# Patient Record
Sex: Male | Born: 1965 | Race: Black or African American | Hispanic: No | Marital: Single | Smoking: Never smoker
Health system: Southern US, Community
[De-identification: ages and names within clinical notes are randomized; demographics above are authoritative.]

## PROBLEM LIST (undated history)

## (undated) DIAGNOSIS — I10 Essential (primary) hypertension: Secondary | ICD-10-CM

## (undated) DIAGNOSIS — I509 Heart failure, unspecified: Secondary | ICD-10-CM

## (undated) HISTORY — PX: SHOULDER SURGERY: SHX246

## (undated) SURGERY — Surgical Case
Anesthesia: *Unknown

---

## 2008-09-08 ENCOUNTER — Ambulatory Visit: Payer: Self-pay | Admitting: Family Medicine

## 2008-09-08 ENCOUNTER — Inpatient Hospital Stay (HOSPITAL_COMMUNITY): Admission: EM | Admit: 2008-09-08 | Discharge: 2008-09-12 | Payer: Self-pay | Admitting: Emergency Medicine

## 2008-09-08 ENCOUNTER — Ambulatory Visit: Payer: Self-pay | Admitting: Cardiology

## 2008-09-08 ENCOUNTER — Encounter: Payer: Self-pay | Admitting: Internal Medicine

## 2008-09-09 ENCOUNTER — Encounter: Payer: Self-pay | Admitting: Cardiology

## 2008-09-12 ENCOUNTER — Encounter: Payer: Self-pay | Admitting: Cardiology

## 2008-09-18 ENCOUNTER — Ambulatory Visit: Payer: Self-pay | Admitting: Cardiology

## 2008-09-18 ENCOUNTER — Encounter: Payer: Self-pay | Admitting: Internal Medicine

## 2008-09-18 DIAGNOSIS — I10 Essential (primary) hypertension: Secondary | ICD-10-CM

## 2008-09-18 DIAGNOSIS — E663 Overweight: Secondary | ICD-10-CM | POA: Insufficient documentation

## 2008-09-18 DIAGNOSIS — I472 Ventricular tachycardia: Secondary | ICD-10-CM

## 2008-09-19 ENCOUNTER — Telehealth (INDEPENDENT_AMBULATORY_CARE_PROVIDER_SITE_OTHER): Payer: Self-pay | Admitting: Nurse Practitioner

## 2008-09-22 ENCOUNTER — Encounter (INDEPENDENT_AMBULATORY_CARE_PROVIDER_SITE_OTHER): Payer: Self-pay | Admitting: Nurse Practitioner

## 2008-09-25 ENCOUNTER — Ambulatory Visit: Payer: Self-pay | Admitting: Internal Medicine

## 2008-09-25 DIAGNOSIS — I5022 Chronic systolic (congestive) heart failure: Secondary | ICD-10-CM | POA: Insufficient documentation

## 2008-09-25 LAB — CONVERTED CEMR LAB
Calcium: 9.2 mg/dL (ref 8.4–10.5)
Chloride: 104 meq/L (ref 96–112)
GFR calc non Af Amer: 58.28 mL/min (ref >60)
Pro B Natriuretic peptide (BNP): 210 pg/mL — ABNORMAL HIGH (ref 0.0–100.0)

## 2008-09-30 ENCOUNTER — Telehealth: Payer: Self-pay | Admitting: Cardiology

## 2008-10-03 ENCOUNTER — Telehealth: Payer: Self-pay | Admitting: Internal Medicine

## 2008-10-06 ENCOUNTER — Ambulatory Visit: Payer: Self-pay | Admitting: Internal Medicine

## 2008-10-07 ENCOUNTER — Encounter (INDEPENDENT_AMBULATORY_CARE_PROVIDER_SITE_OTHER): Payer: Self-pay | Admitting: Nurse Practitioner

## 2008-10-07 ENCOUNTER — Ambulatory Visit (HOSPITAL_BASED_OUTPATIENT_CLINIC_OR_DEPARTMENT_OTHER): Admission: RE | Admit: 2008-10-07 | Discharge: 2008-10-07 | Payer: Self-pay | Admitting: Nurse Practitioner

## 2008-10-14 ENCOUNTER — Encounter (INDEPENDENT_AMBULATORY_CARE_PROVIDER_SITE_OTHER): Payer: Self-pay | Admitting: Nurse Practitioner

## 2008-10-15 ENCOUNTER — Encounter (INDEPENDENT_AMBULATORY_CARE_PROVIDER_SITE_OTHER): Payer: Self-pay | Admitting: Nurse Practitioner

## 2008-10-16 ENCOUNTER — Telehealth (INDEPENDENT_AMBULATORY_CARE_PROVIDER_SITE_OTHER): Payer: Self-pay | Admitting: Nurse Practitioner

## 2008-10-19 ENCOUNTER — Ambulatory Visit: Payer: Self-pay | Admitting: Internal Medicine

## 2008-10-21 ENCOUNTER — Encounter: Payer: Self-pay | Admitting: Internal Medicine

## 2008-10-22 ENCOUNTER — Encounter (INDEPENDENT_AMBULATORY_CARE_PROVIDER_SITE_OTHER): Payer: Self-pay | Admitting: Nurse Practitioner

## 2008-10-23 ENCOUNTER — Ambulatory Visit: Payer: Self-pay | Admitting: Cardiology

## 2008-10-23 ENCOUNTER — Encounter (INDEPENDENT_AMBULATORY_CARE_PROVIDER_SITE_OTHER): Payer: Self-pay | Admitting: Nurse Practitioner

## 2008-10-23 DIAGNOSIS — R5383 Other fatigue: Secondary | ICD-10-CM

## 2008-10-23 DIAGNOSIS — R0602 Shortness of breath: Secondary | ICD-10-CM | POA: Insufficient documentation

## 2008-10-23 DIAGNOSIS — R5381 Other malaise: Secondary | ICD-10-CM

## 2008-10-23 LAB — CONVERTED CEMR LAB
CO2: 28 meq/L (ref 19–32)
Calcium: 9 mg/dL (ref 8.4–10.5)
Glucose, Bld: 91 mg/dL (ref 70–99)
Magnesium: 2 mg/dL (ref 1.5–2.5)
Sodium: 143 meq/L (ref 135–145)

## 2008-10-26 ENCOUNTER — Encounter: Payer: Self-pay | Admitting: Cardiology

## 2008-10-28 ENCOUNTER — Ambulatory Visit: Payer: Self-pay

## 2008-10-29 ENCOUNTER — Telehealth (INDEPENDENT_AMBULATORY_CARE_PROVIDER_SITE_OTHER): Payer: Self-pay | Admitting: Nurse Practitioner

## 2008-10-30 ENCOUNTER — Ambulatory Visit: Payer: Self-pay | Admitting: Internal Medicine

## 2008-11-05 ENCOUNTER — Ambulatory Visit (HOSPITAL_BASED_OUTPATIENT_CLINIC_OR_DEPARTMENT_OTHER): Admission: RE | Admit: 2008-11-05 | Discharge: 2008-11-05 | Payer: Self-pay | Admitting: Internal Medicine

## 2008-11-27 ENCOUNTER — Ambulatory Visit: Payer: Self-pay | Admitting: Cardiology

## 2008-11-27 ENCOUNTER — Ambulatory Visit: Payer: Self-pay

## 2008-11-27 ENCOUNTER — Encounter: Payer: Self-pay | Admitting: Internal Medicine

## 2008-12-15 ENCOUNTER — Ambulatory Visit: Payer: Self-pay | Admitting: Pulmonary Disease

## 2008-12-15 DIAGNOSIS — G4733 Obstructive sleep apnea (adult) (pediatric): Secondary | ICD-10-CM

## 2008-12-24 ENCOUNTER — Encounter: Payer: Self-pay | Admitting: Pulmonary Disease

## 2009-01-26 ENCOUNTER — Ambulatory Visit: Payer: Self-pay | Admitting: Pulmonary Disease

## 2009-01-27 ENCOUNTER — Encounter: Payer: Self-pay | Admitting: Pulmonary Disease

## 2009-02-11 ENCOUNTER — Telehealth: Payer: Self-pay | Admitting: Internal Medicine

## 2009-02-13 ENCOUNTER — Encounter (INDEPENDENT_AMBULATORY_CARE_PROVIDER_SITE_OTHER): Payer: Self-pay | Admitting: Nurse Practitioner

## 2009-02-19 ENCOUNTER — Telehealth (INDEPENDENT_AMBULATORY_CARE_PROVIDER_SITE_OTHER): Payer: Self-pay | Admitting: Nurse Practitioner

## 2009-02-24 ENCOUNTER — Telehealth (INDEPENDENT_AMBULATORY_CARE_PROVIDER_SITE_OTHER): Payer: Self-pay | Admitting: Nurse Practitioner

## 2009-03-03 ENCOUNTER — Ambulatory Visit: Payer: Self-pay | Admitting: Internal Medicine

## 2009-03-11 ENCOUNTER — Ambulatory Visit: Payer: Self-pay

## 2009-03-11 ENCOUNTER — Encounter: Payer: Self-pay | Admitting: Internal Medicine

## 2009-03-11 ENCOUNTER — Ambulatory Visit: Payer: Self-pay | Admitting: Cardiology

## 2009-03-11 ENCOUNTER — Ambulatory Visit (HOSPITAL_COMMUNITY): Admission: RE | Admit: 2009-03-11 | Discharge: 2009-03-11 | Payer: Self-pay | Admitting: Internal Medicine

## 2009-03-16 ENCOUNTER — Telehealth (INDEPENDENT_AMBULATORY_CARE_PROVIDER_SITE_OTHER): Payer: Self-pay | Admitting: *Deleted

## 2009-05-21 ENCOUNTER — Emergency Department (HOSPITAL_COMMUNITY): Admission: EM | Admit: 2009-05-21 | Discharge: 2009-05-22 | Payer: Self-pay | Admitting: Emergency Medicine

## 2010-08-01 LAB — DIFFERENTIAL
Basophils Absolute: 0 10*3/uL (ref 0.0–0.1)
Basophils Relative: 1 % (ref 0–1)
Eosinophils Absolute: 0.3 10*3/uL (ref 0.0–0.7)
Monocytes Absolute: 0.4 10*3/uL (ref 0.1–1.0)
Monocytes Relative: 9 % (ref 3–12)

## 2010-08-01 LAB — CBC
HCT: 41.1 % (ref 39.0–52.0)
Hemoglobin: 14.5 g/dL (ref 13.0–17.0)
RDW: 13.6 % (ref 11.5–15.5)
WBC: 4.8 10*3/uL (ref 4.0–10.5)

## 2010-08-01 LAB — POCT CARDIAC MARKERS: Troponin i, poc: <0.05 ng/mL (ref 0.00–0.09)

## 2010-08-01 LAB — RAPID URINE DRUG SCREEN, HOSP PERFORMED
Barbiturates: NOT DETECTED
Cocaine: NOT DETECTED
Opiates: NOT DETECTED

## 2010-08-01 LAB — BASIC METABOLIC PANEL
BUN: 8 mg/dL (ref 6–23)
Calcium: 9.1 mg/dL (ref 8.4–10.5)
Creatinine, Ser: 1.15 mg/dL (ref 0.4–1.5)
GFR calc non Af Amer: >60 mL/min (ref >60)
Potassium: 3.4 mEq/L — ABNORMAL LOW (ref 3.5–5.1)

## 2010-08-25 LAB — POCT I-STAT, CHEM 8
BUN: 17 mg/dL (ref 6–23)
Calcium, Ion: 1.1 mmol/L — ABNORMAL LOW (ref 1.12–1.32)
Glucose, Bld: 134 mg/dL — ABNORMAL HIGH (ref 70–99)
HCT: 45 % (ref 39.0–52.0)
Sodium: 140 mEq/L (ref 135–145)

## 2010-08-25 LAB — BASIC METABOLIC PANEL
BUN: 25 mg/dL — ABNORMAL HIGH (ref 6–23)
CO2: 23 mEq/L (ref 19–32)
Calcium: 8.8 mg/dL (ref 8.4–10.5)
Chloride: 103 mEq/L (ref 96–112)
Chloride: 109 mEq/L (ref 96–112)
GFR calc Af Amer: >60 mL/min (ref >60)
GFR calc non Af Amer: 47 mL/min — ABNORMAL LOW (ref >60)
GFR calc non Af Amer: 52 mL/min — ABNORMAL LOW (ref >60)
GFR calc non Af Amer: 52 mL/min — ABNORMAL LOW (ref >60)
Glucose, Bld: 116 mg/dL — ABNORMAL HIGH (ref 70–99)
Glucose, Bld: 129 mg/dL — ABNORMAL HIGH (ref 70–99)
Glucose, Bld: 139 mg/dL — ABNORMAL HIGH (ref 70–99)
Potassium: 3.6 mEq/L (ref 3.5–5.1)
Potassium: 4.2 mEq/L (ref 3.5–5.1)
Potassium: 4.7 mEq/L (ref 3.5–5.1)
Sodium: 135 mEq/L (ref 135–145)
Sodium: 143 mEq/L (ref 135–145)
Sodium: 145 mEq/L (ref 135–145)

## 2010-08-25 LAB — DIFFERENTIAL
Basophils Absolute: 0 10*3/uL (ref 0.0–0.1)
Basophils Absolute: 0.1 10*3/uL (ref 0.0–0.1)
Basophils Relative: 1 % (ref 0–1)
Eosinophils Absolute: 0 10*3/uL (ref 0.0–0.7)
Eosinophils Relative: 0 % (ref 0–5)
Lymphocytes Relative: 39 % (ref 12–46)
Monocytes Absolute: 0.4 10*3/uL (ref 0.1–1.0)
Monocytes Relative: 9 % (ref 3–12)
Neutro Abs: 2.9 10*3/uL (ref 1.7–7.7)
Neutro Abs: 3 10*3/uL (ref 1.7–7.7)
Neutrophils Relative %: 51 % (ref 43–77)

## 2010-08-25 LAB — LIPID PANEL
Cholesterol: 148 mg/dL (ref 0–200)
LDL Cholesterol: 96 mg/dL (ref 0–99)
Total CHOL/HDL Ratio: 4.6 RATIO
Triglycerides: 100 mg/dL (ref <150)
VLDL: 20 mg/dL (ref 0–40)

## 2010-08-25 LAB — BRAIN NATRIURETIC PEPTIDE: Pro B Natriuretic peptide (BNP): 748 pg/mL — ABNORMAL HIGH (ref 0.0–100.0)

## 2010-08-25 LAB — POCT I-STAT 3, ART BLOOD GAS (G3+)
Acid-base deficit: 2 mmol/L (ref 0.0–2.0)
pH, Arterial: 7.409 (ref 7.350–7.450)
pO2, Arterial: 78 mmHg — ABNORMAL LOW (ref 80.0–100.0)

## 2010-08-25 LAB — COMPREHENSIVE METABOLIC PANEL
ALT: 87 U/L — ABNORMAL HIGH (ref 0–53)
Albumin: 3.3 g/dL — ABNORMAL LOW (ref 3.5–5.2)
Alkaline Phosphatase: 63 U/L (ref 39–117)
BUN: 17 mg/dL (ref 6–23)
Chloride: 108 mEq/L (ref 96–112)
Glucose, Bld: 146 mg/dL — ABNORMAL HIGH (ref 70–99)
Potassium: 4.3 mEq/L (ref 3.5–5.1)
Sodium: 141 mEq/L (ref 135–145)
Total Bilirubin: 1.7 mg/dL — ABNORMAL HIGH (ref 0.3–1.2)
Total Protein: 6.6 g/dL (ref 6.0–8.3)

## 2010-08-25 LAB — POCT I-STAT 3, VENOUS BLOOD GAS (G3P V)
TCO2: 28 mmol/L (ref 0–100)
pH, Ven: 7.371 — ABNORMAL HIGH (ref 7.250–7.300)

## 2010-08-25 LAB — PROTIME-INR: Prothrombin Time: 17.9 seconds — ABNORMAL HIGH (ref 11.6–15.2)

## 2010-08-25 LAB — URINALYSIS, ROUTINE W REFLEX MICROSCOPIC
Glucose, UA: NEGATIVE mg/dL
Ketones, ur: NEGATIVE mg/dL
Protein, ur: >300 mg/dL — AB
Urobilinogen, UA: 1 mg/dL (ref 0.0–1.0)

## 2010-08-25 LAB — POCT CARDIAC MARKERS: Myoglobin, poc: 206 ng/mL (ref 12–200)

## 2010-08-25 LAB — CK TOTAL AND CKMB (NOT AT ARMC): Total CK: 585 U/L — ABNORMAL HIGH (ref 7–232)

## 2010-08-25 LAB — CBC
HCT: 40 % (ref 39.0–52.0)
HCT: 41.9 % (ref 39.0–52.0)
Hemoglobin: 13.8 g/dL (ref 13.0–17.0)
Hemoglobin: 14.7 g/dL (ref 13.0–17.0)
MCHC: 34.9 g/dL (ref 30.0–36.0)
MCV: 92.7 fL (ref 78.0–100.0)
Platelets: 303 10*3/uL (ref 150–400)
RDW: 14.4 % (ref 11.5–15.5)
WBC: 4.6 10*3/uL (ref 4.0–10.5)
WBC: 5.3 10*3/uL (ref 4.0–10.5)

## 2010-08-25 LAB — RAPID URINE DRUG SCREEN, HOSP PERFORMED
Amphetamines: NOT DETECTED
Barbiturates: NOT DETECTED
Benzodiazepines: NOT DETECTED
Tetrahydrocannabinol: NOT DETECTED

## 2010-08-25 LAB — CARDIAC PANEL(CRET KIN+CKTOT+MB+TROPI)
CK, MB: 5.6 ng/mL — ABNORMAL HIGH (ref 0.3–4.0)
Relative Index: 1.1 (ref 0.0–2.5)

## 2010-08-25 LAB — CREATININE, URINE, RANDOM: Creatinine, Urine: 218.9 mg/dL

## 2010-08-25 LAB — SODIUM, URINE, RANDOM: Sodium, Ur: 46 mEq/L

## 2010-08-25 LAB — URINE MICROSCOPIC-ADD ON

## 2010-08-25 LAB — GLUCOSE, CAPILLARY: Glucose-Capillary: 119 mg/dL — ABNORMAL HIGH (ref 70–99)

## 2010-08-25 LAB — D-DIMER, QUANTITATIVE: D-Dimer, Quant: 0.64 ug/mL-FEU — ABNORMAL HIGH (ref 0.00–0.48)

## 2010-08-25 LAB — HEMOGLOBIN A1C: Mean Plasma Glucose: 126 mg/dL

## 2010-09-28 NOTE — Procedures (Signed)
NAME:  Kyle Conrad, Kyle Conrad NO.:  [id number]   MEDICAL RECORD NO.:  [id number]          PATIENT TYPE:  OUT   LOCATION:  SLEEP CENTER                 FACILITY:  [facility]   PHYSICIAN:  Clinton D. Maple Hudson, MD, FCCP, FACPDATE OF BIRTH:  02-Aug-1963   DATE OF STUDY:  10/07/2008                            NOCTURNAL POLYSOMNOGRAM   REFERRING PHYSICIAN:  [name], ACNP   REFERRING PHYSICIAN:  [name], ACNP   INDICATION FOR STUDY:  Hypersomnia with sleep apnea.   EPWORTH SLEEPINESS SCORE:  7/24.  BMI 33.2.  Weight was 245 pounds.  Height 72 inches.  Neck 17 inches.   MEDICATIONS:  Charted and reviewed.   SLEEP ARCHITECTURE:  Total sleep time 222 minutes with sleep efficiency  62.6%.  Stage I was 24.5%, stage II 62.8%, stage III absent, REM 12.6%  of total sleep time.  Sleep latency 54 minutes, REM latency 204 minutes,  awake after sleep onset 78.5 minutes, arousal index 27.  No bedtime  medication was taken.  Sleep onset was after midnight and was marked by  frequent sleep stage changes and brief waking until almost 3:00 a.m. and  sleep became better consolidated.  He indicated he was bothered some by  the monitoring leads and room temperature.   RESPIRATORY DATA:  Apnea-hypopnea index (AHI) 53.8 per hour.  A total of  199 events was scored including 86 obstructive apnea, 34 central apnea,  39 mixed apneas, and 40 hypopnea.  Events were more common while  nonsupine.  REM AHI 40.7.  This study was ordered as a diagnostic NPSG  protocol.  CPAP titration was not attempted.   OXYGEN DATA:  Moderate snoring with oxygen desaturation to a nadir of  85%.  Mean oxygen saturation through the study was 93.5% on room air.  A  total of 3.5 minutes was recorded with oxygen saturation less than 88%  on room air.   CARDIAC DATA:  Sinus rhythm with occasional PAC.   MOVEMENT-PARASOMNIA:  No significant movement disturbance.  No bathroom  trips.    IMPRESSION/RECOMMENDATIONS:  1. Restless fragmented sleep quality with sleep onset after midnight      and difficulty sustaining fragmented sleep.  2. Severe obstructive sleep apnea-hypopnea syndrome, apnea-hypopnea      index 53.8 per hour.  Events were more common while sleeping off      flat of back.  Moderately loud snoring with oxygen desaturation to      a nadir of 85%.  3. This study was ordered as a diagnostic nocturnal polysomnogram      protocol.  Consider return for continuous positive airway pressure      titration or evaluate for alternative management as appropriate.      Clinton D. Maple Hudson, MD, [facility], FACP  Diplomate, Biomedical engineer of Sleep Medicine  Electronically Signed     CDY/MEDQ  D:  10/19/2008 15:28:04  T:  10/20/2008 03:51:11  Job:  [id number]

## 2010-09-28 NOTE — Cardiovascular Report (Signed)
NAMEHAWKIN, CHARO NO.:  [id number]   MEDICAL RECORD NO.:  [id number]          PATIENT TYPE:  INP   LOCATION:  2030                         FACILITY:  [facility]   PHYSICIAN:  Verne Carrow, MDDATE OF BIRTH:  01-27-1966   DATE OF PROCEDURE:  09/11/2008  DATE OF DISCHARGE:                            CARDIAC CATHETERIZATION   PRIMARY CARDIOLOGIST:  Duke Salvia, MD, [facility]   PROCEDURES PERFORMED:  1. Left heart catheterization.  2. Right heart catheterization.  3. Selective coronary angiography.   OPERATOR:  Verne Carrow, MD   INDICATION:  This is a 45 year old African American male with a history  of hypertension who presented with dyspnea and was found to have new  onset cardiomyopathy with ejection fraction estimated at 20-25%.  The  patient has been having runs of nonsustained ventricular tachycardia in  the hospital and is referred today for right and left heart  catheterization.   PROCEDURE IN DETAIL:  The patient was brought into the inpatient cardiac  catheterization laboratory after signing informed consent for the  procedure.  The right groin was prepped and draped in a sterile fashion.  Lidocaine 1% was used for local anesthesia.  A 5-French sheath was  inserted into the right femoral artery without difficulty.  A 7-French  sheath was inserted into the right femoral vein without difficulty.  A  Swan-Ganz catheter was used to perform a right heart catheterization  with pressures measured in the right atrium, right ventricle, pulmonary  artery, and in a pulmonary capillary wedge position.  The Swan-Ganz  catheter was removed and then selective coronary angiography was  performed with standard diagnostic catheters.  At this point, a 5-French  pigtail catheter was used to cross the aortic valve into the left  ventricle.  No left ventricular angiogram was performed secondary to his  renal insufficiency.  Catheter was pulled back across the  aortic valve  with no significant pressure gradient measured.  The patient tolerated  the procedure well and was taken to the holding area in stable  condition.   HEMODYNAMIC FINDINGS:  Central aortic pressure 107/90, right atrial  pressure 16, right ventricular pressure 42/14, right ventricular end-  diastolic pressure 15, pulmonary artery pressure 43/26 with a mean  pulmonary artery pressure of 34, pulmonary capillary wedge pressure mean  29, aortic saturation 96%, pulmonary artery saturation 56%, cardiac  output 3.93 liters per minute (Fick method).  Cardiac index 1.67 liters  per minute per meter squared.   ANGIOGRAPHIC FINDINGS:  1. The left main coronary artery has no evidence of disease.  2. The left anterior descending is a large vessel that courses to the      apex and wraps around the apex.  This gives off a very large      diagonal branch that supplies much of the lateral wall.  There was      no evidence of disease in this system.  3. The circumflex artery is a small-to-moderate-sized vessel that has      a small area of distribution in the lateral wall.  There was no  evidence of disease in this system.  4. The right coronary artery is a large dominant vessel that      bifurcates into a posterior descending artery and a posterolateral      branch.  It gives off a small-to-moderate-sized bifurcating RV      marginal branch.  There was no evidence of disease in this system.  5. No left ventricular angiogram was performed secondary to his renal      insufficiency.   IMPRESSION:  1. Elevated filling pressures.  2. No angiographic evidence of coronary artery disease.  3. Reduced cardiac output.   RECOMMENDATIONS:  Because of his renal insufficiency, I would recommend  gentle hydration postcatheterization and then we will recommend gentle  diuresis with the elevated filling pressures.  I would continue his  aspirin, beta-blocker, and ACE inhibitor.       Verne Carrow, MD  Electronically Signed     CM/MEDQ  D:  09/11/2008  T:  09/12/2008  Job:  [id number]   cc:   Duke Salvia, MD, [facility]

## 2010-09-28 NOTE — Discharge Summary (Signed)
Kyle Conrad, BETZOLD NO.:  [id number]   MEDICAL RECORD NO.:  [id number]          PATIENT TYPE:  INP   LOCATION:  2030                         FACILITY:  [facility]   PHYSICIAN:  Duke Salvia, MD, FACCDATE OF BIRTH:  1966/01/09   DATE OF ADMISSION:  09/08/2008  DATE OF DISCHARGE:  09/12/2008                               DISCHARGE SUMMARY   CARDIOLOGIST:  Duke Salvia, MD, [facility]   DISCHARGING DIAGNOSES:  1. Congestive heart failure secondary to nonischemic cardiomyopathy      with unclear etiology at this time, most likely secondary to      uncontrolled hypertension.  2. Nonsustained ventricular tachycardia.  3. Hypertension x6 years, previously managed with exercise and diet      per the patient report.  4. Impaired glucose tolerance this admission.  5. Presumed obstructive sleep apnea pending formal sleep      evaluation/study outpatient.  6. Status post cardiac catheterization this admission.  The patient      with no angiographic evidence of coronary artery disease, but with      elevated filling pressures, and reduced cardiac output.   Kyle Conrad is a 45 year old African American gentleman with history of  allergic rhinitis and diet-controlled hypertension per the patient's  report who presented on day of admission complaining of lightheadedness,  presyncope associated with dizziness, shortness of breath, nausea, and  vomiting.  Shortness of breath became more pronounced.  The patient has  significant orthopnea and chest tightness.  The patient initially went  to his primary care physician's office.  EKG was done.  Blood pressure  was checked.  Chest x-ray was done.  The patient was told his heart was  enlarged, his blood pressure was elevated and that his heart rate was  accelerated.  In the emergency room here, his blood pressure was in the  170s to 120s range and he had a heart rate which apparently in the range  of near 200, but rhythm strip was not  obtained nor an EKG.  Darryl  Prime, Cardiology fellow saw the patient in the ER.  His heart rate had  dropped to the 110 range.  CT of the head showed no acute findings.  CT  angio of the chest showed no evidence of pulmonary emboli.  Chest x-ray  showed ground-glass opacities in the lungs likely representing edema.  There was mild vascular congestion.  EKG showed sinus tachycardia with a  rate greater than 116 beats per minute, probable LVH.  Drug screen was  negative.  BNP was elevated at 1452, troponin 0.03, creatinine 1.7.  The  patient was started on beta-blocker, aspirin, and nitrates.  He was  given Lopressor IV and 40 of Lasix IV.  He was admitted for further  evaluation.  A 2-D echocardiogram obtained on September 09, 2008, the  patient noted to have an EF of 20-25%.  The patient responded well to  diuretics, blood pressure stabilized.  The patient ruled out for  myocardial infarction by serial cardiac markers.  Decided to proceed  with a cardiac catheterization.  The patient had noted runs  of  nonsustained V-tach, asymptomatic.  The patient taken to the cath lab on  September 11, 2008, by Dr. Verne Carrow.   HEMODYNAMIC FINDINGS:  Pulmonary artery pressure 43/26 with a mean  pulmonary artery pressure of 34.  Pulmonary capillary wedge pressure  mean of 29.  Cardiac output 3.93 liters per minute, Fick method.  Cardiac index 1.67 liters per minute.   The patient tolerated the procedure without complications.  Nutritional  consult requested and the patient spoke with dietitian regarding the 2-g  sodium restricted diet.  Dr. Graciela Husbands will see the patient on day of  discharge.  The patient still with elevated blood pressure at times as  high as 194/94, lisinopril increased to 20 mg daily, hydrochlorothiazide  added to medications.  The patient tolerating Coreg 25 mg b.i.d.  The  patient has ambulated in the hall, stable for discharge home later  today.  Weight at time of discharge 244  pounds.   Medications include lisinopril/hydrochlorothiazide 20/12.5 mg daily,  Coreg 25 mg b.i.d., and aspirin 325.  I have initiated CHF teaching with  the patient.  He will follow up in the clinic with me on Sep 18, 2008 at  2:30, which time, I will plan on repeating BMET and a BNP and then I  will arrange for the patient to follow up with Dr. Graciela Husbands.  We will also  arrange outpatient sleep study.  Duration of discharge encountered over  30 minutes.      [name], ACNP      Duke Salvia, MD, [facility]  Electronically Signed    MB/MEDQ  D:  09/12/2008  T:  09/13/2008  Job:  [id number]   cc:   Sharlot Gowda, M.D.

## 2010-09-28 NOTE — H&P (Signed)
NAMEKENITH, TRICKEL             ACCOUNT NO.:  [id number]   MEDICAL RECORD NO.:  [id number]          PATIENT TYPE:  INP   LOCATION:  2312                         FACILITY:  [facility]   PHYSICIAN:  Darryl D. Prime, MD    DATE OF BIRTH:  1965/10/07   DATE OF ADMISSION:  09/08/2008  DATE OF DISCHARGE:                              HISTORY & PHYSICAL   CODE STATUS:  Patient is full code.   CARDIOLOGIST:  None.   PRIMARY CARE PHYSICIAN:   CHIEF COMPLAINT:  Heart rate is fast and he keeps throwing up.   HISTORY OF PRESENT ILLNESS:  Mr. Carignan is a 45 year old male with a  history of allergic rhinitis who notes 5 days of significant dizziness,  which when pressed, sounds like lightheadedness/presyncope with no  vertigo, upon walking.  The patient had an episode 5 days prior to  admission where he became dizzy, short of breath, lightheaded with  exertion and had a bout of emesis, nonbloody and nonbilious.  Since  then, he has had significant orthopnea, chest tightness particularly  when he tries to lay flat.  His chest tightness is described as a mild  sharp pain, squeezing sensation, when he lays flat.  It has been waking  him out of sleep and this does sound like paroxysmal nocturnal dyspnea,  at least once a night.  The patient saw his primary care physician after  having these symptoms, having called to set up an appointment.  He saw  him on September 08, 2008, and EKG was done, blood pressure was checked,  chest x-ray was done and he was told that his heart was enlarged, his  blood pressure was elevated as well as his heart rate and he was set up  for an imaging study, unsure what type of imaging study this was.  The  patient, apparently when he left the doctors office, had an episode of  emesis and shortness of breath and was brought to the emergency room.  He has had significant dyspnea on exertion, to 60 feet, over the last 7  days.  The patient notes the symptoms are more profound  when going up  stairs.  In the emergency room, his blood pressure is in the range of  170s/120s and he had a heart rate which was apparently in the range of  near 200 but the rhythm strip was not obtained, nor an EKG.  His heart  rate has since dropped down into the 110s range.  EKG was obtained at  that time.  He denies any recent fever, denies any recent cold like  symptoms.   PAST MEDICAL HISTORY:  1. He has never had a cardiac catheterization.  He has never had an      echocardiogram.  2. He has a history of hypertension for 5-6 years now that he      apparently is trying to control with exercise and diet.  3. History of obesity.  4. History of rotator cuff tear, status post repair on the right.   ALLERGIES:  PENICILLIN which causes rash.   MEDICATIONS:  Claritin.  SOCIAL HISTORY:  He lives in [place].  He has never smoked and has  not had any alcohol in many years.  No illicit drug use.  He is an  Airline pilot, is single.   FAMILY HISTORY:  Positive for diabetes mellitus in both father and  mother in her 24s.  No premature coronary artery disease.   REVIEW OF SYSTEMS:  A 14-point review of systems is negative unless  stated above.   PHYSICAL EXAMINATION:  VITAL SIGNS:  Temperature 97.6 with a blood  pressure 173/122.  Respiratory rate 22.  Saturation 93% in room air.  GENERAL:  The patient is an obese male, sitting upright in mild  respiratory distress.  He had a bout of emesis in the emergency room.  HEENT:  Normocephalic, atraumatic.  Pupils are equal, round and reactive  to light.  Extraocular movements intact.  Oropharynx dry.  NECK:  Supple without lymphadenopathy or thyromegaly.  Jugular venous  distention is to the mandible.  The patient has no carotid bruits.  CARDIOVASCULAR:  Regular rhythm with a fast rate and S4 gallop noted.  LUNGS: Bibasilar rales on inspiration.  SKIN:  No rash.  ABDOMEN:  Obese, soft, nontender. No hepatosplenomegaly.  No rebound or   guarding.  EXTREMITIES:  No clubbing, cyanosis, or edema.  NEUROLOGIC:  He is alert and oriented x4 with cranial nerves II-XII  grossly intact.  Strength and sensation grossly intact.   DATA:  CT of the head showed no acute disease.  He had a CT angio of the  chest as he had a mildly elevated D-dimer which showed no evidence of  pulmonary embolus.  He had ground-glass opacities in the lungs, likely  representing edema.  There was mild vascular congestion.  The patient  had cardiomegaly.  His EKG showed sinus tachycardia with rate greater  than 116 beats per minute, normal axe; intervals PR 160, QRS 170, QT  corrected 428 with right and left atrial enlargement by EKG, probable  LVH with no prior EKG available.   Labs showed white count of 5.7, hemoglobin 14.9, hematocrit 42.8 and  platelets of 324.  The D-dimer was 0.64.  urinalysis showed rare  bacteria, protein 300, urine drug screen negative.  B-type natriuretic  peptide 1452.  Cardiac markers at 1954 hours unremarkable.  His CK-MB at  2217 hours was 6.5; however CK 585 with troponin 0.03.  Sodium 140,  potassium 4.3, chloride 109, bicarb 22, BUN 17, creatinine 1.7, and  glucose of 134.  No old creatinine to compare.   ASSESSMENT AND PLAN:  This is a patient who has been having problems  with profound dyspnea on exertion, emesis associated with it and chest  tightness.  He is possibly having hypertensive emergency with signs of  kidney dysfunction and heart dysfunction.  He has an apparent acute on  chronic diastolic heart failure, cannot exclude systolic heart failure  as well.  He will be admitted to rule out acute coronary syndrome,  supraventricular or ventricular arrhythmias will also be investigated.  The patient will be placed on beta blockade, aspirin, and nitrates as  needed and we will concentrate on controlling his blood pressure  aggressively.  His blood pressure was 130/90 once he was given Lopressor  5 mg IV and 25  p.o. Lopressor in the emergency room.  He was also given  40 mg IV of Lasix.  For his hypertensive emergency, will control his  blood pressure as above.  For his congestive heart failure, will  control  his blood pressure, place him on a beta blocker.  Will also get an  echocardiogram.  He will most likely benefit from an ARB or ACE  inhibitor before discharge.  Place him on strict in's and out's, daily  weights, 2 gram  sodium diet and he will need counseling concerning blood pressure and  possible heart failure.  Check a TSH and magnesium.  For his kidney  dysfunction, will get a renal ultrasound and protein /creatinine ratio.  He may need a nephrology consult eventually.  DVT and GI prophylaxis  will be ordered.      Darryl D. Prime, MD  Electronically Signed     DDP/MEDQ  D:  09/09/2008  T:  09/09/2008  Job:  [id number]

## 2010-09-28 NOTE — Procedures (Signed)
NAMEMarland Kitchen  Kyle Conrad, Kyle Conrad NO.:  [id number]   MEDICAL RECORD NO.:  [id number]          PATIENT TYPE:  OUT   LOCATION:  SLEEP CENTER                 FACILITY:  [facility]   PHYSICIAN:  Barbaraann Share, MD,FCCPDATE OF BIRTH:  1965-06-25   DATE OF STUDY:  11/05/2008                            NOCTURNAL POLYSOMNOGRAM   REFERRING PHYSICIAN:  Duke Salvia, MD, [facility]   REFERRING PHYSICIAN:  Duke Salvia, MD, [facility]   INDICATION FOR STUDY:  Hypersomnia with sleep apnea.  The patient  returns for this CPAP optimization after being diagnosed with severe  obstructive sleep apnea.   EPWORTH SLEEPINESS SCORE:  7.   MEDICATIONS:   SLEEP ARCHITECTURE:  The patient had a total sleep time of 235 minutes  with no slow wave sleep and only 70 minutes of REM noted.  Sleep onset  latency was prolonged at 58 minutes, and REM onset was normal at 90  minutes.  Sleep efficiency was poor at 66%.   RESPIRATORY DATA:  The patient underwent CPAP titration with a large  ResMed Quattro Full Face Mask.  His pressure was initiated at a level of  6 cm of water and gradually increased in order to treat both obstructive  events and loud snoring.  The patient had good control of his  obstructive events at a pressure of 15, however, had some mild  intermittent breakthrough snoring that was ultimately resolved with a  pressure of 17 cm of water.  There seemed to be good tolerance  throughout the night.   OXYGEN DATA:  There was O2 desaturation as low as 81% prior to reaching  optimal CPAP.   CARDIAC DATA:  Occasional PVC, but no clinically significant arrhythmias  were seen.   MOVEMENT-PARASOMNIA:  The patient had no significant leg jerks or  abnormal behavior seen.   IMPRESSIONS-RECOMMENDATIONS:  1. Good control of previously documented severe obstructive sleep      apnea with a large ResMed Quattro Full Face Mask, and delivering a      CPAP pressure of 15-17 cm of water.  I would initiate the  patient      on 10 cm of water pressure if he has not been on CPAP prior, and      work towards pressure of 15 cm and see how he does.  I would      also encourage the patient to work aggressively on weight loss.  2. Occasional PVC, but no clinically significant arrhythmia seen.      Barbaraann Share, MD,FCCP  Diplomate, American Board of Sleep  Medicine  Electronically Signed     KMC/MEDQ  D:  11/12/2008 20:25:35  T:  11/13/2008 07:59:38  Job:  [id number]

## 2015-04-22 ENCOUNTER — Encounter (HOSPITAL_COMMUNITY): Payer: Self-pay | Admitting: Emergency Medicine

## 2015-04-22 ENCOUNTER — Emergency Department (HOSPITAL_COMMUNITY): Payer: Self-pay

## 2015-04-22 ENCOUNTER — Inpatient Hospital Stay (HOSPITAL_COMMUNITY)
Admission: EM | Admit: 2015-04-22 | Discharge: 2015-05-02 | DRG: 287 | Disposition: A | Discharge status: Home / Self Care | Payer: Self-pay | Attending: Internal Medicine | Admitting: Internal Medicine

## 2015-04-22 DIAGNOSIS — R188 Other ascites: Secondary | ICD-10-CM | POA: Diagnosis present

## 2015-04-22 DIAGNOSIS — G4733 Obstructive sleep apnea (adult) (pediatric): Secondary | ICD-10-CM | POA: Diagnosis present

## 2015-04-22 DIAGNOSIS — R601 Generalized edema: Secondary | ICD-10-CM

## 2015-04-22 DIAGNOSIS — D649 Anemia, unspecified: Secondary | ICD-10-CM

## 2015-04-22 DIAGNOSIS — I429 Cardiomyopathy, unspecified: Secondary | ICD-10-CM | POA: Diagnosis present

## 2015-04-22 DIAGNOSIS — I509 Heart failure, unspecified: Secondary | ICD-10-CM

## 2015-04-22 DIAGNOSIS — R0602 Shortness of breath: Secondary | ICD-10-CM

## 2015-04-22 DIAGNOSIS — E8809 Other disorders of plasma-protein metabolism, not elsewhere classified: Secondary | ICD-10-CM | POA: Diagnosis present

## 2015-04-22 DIAGNOSIS — K2960 Other gastritis without bleeding: Secondary | ICD-10-CM | POA: Diagnosis present

## 2015-04-22 DIAGNOSIS — I10 Essential (primary) hypertension: Secondary | ICD-10-CM | POA: Diagnosis present

## 2015-04-22 DIAGNOSIS — M7989 Other specified soft tissue disorders: Secondary | ICD-10-CM

## 2015-04-22 DIAGNOSIS — D689 Coagulation defect, unspecified: Secondary | ICD-10-CM | POA: Diagnosis present

## 2015-04-22 DIAGNOSIS — K922 Gastrointestinal hemorrhage, unspecified: Secondary | ICD-10-CM

## 2015-04-22 DIAGNOSIS — I5023 Acute on chronic systolic (congestive) heart failure: Secondary | ICD-10-CM

## 2015-04-22 DIAGNOSIS — I110 Hypertensive heart disease with heart failure: Principal | ICD-10-CM | POA: Diagnosis present

## 2015-04-22 DIAGNOSIS — R000 Tachycardia, unspecified: Secondary | ICD-10-CM | POA: Diagnosis present

## 2015-04-22 DIAGNOSIS — D500 Iron deficiency anemia secondary to blood loss (chronic): Secondary | ICD-10-CM

## 2015-04-22 DIAGNOSIS — I272 Other secondary pulmonary hypertension: Secondary | ICD-10-CM | POA: Diagnosis present

## 2015-04-22 DIAGNOSIS — Z9114 Patient's other noncompliance with medication regimen: Secondary | ICD-10-CM

## 2015-04-22 DIAGNOSIS — K449 Diaphragmatic hernia without obstruction or gangrene: Secondary | ICD-10-CM | POA: Diagnosis present

## 2015-04-22 DIAGNOSIS — E876 Hypokalemia: Secondary | ICD-10-CM | POA: Diagnosis present

## 2015-04-22 DIAGNOSIS — K269 Duodenal ulcer, unspecified as acute or chronic, without hemorrhage or perforation: Secondary | ICD-10-CM | POA: Diagnosis present

## 2015-04-22 DIAGNOSIS — K5730 Diverticulosis of large intestine without perforation or abscess without bleeding: Secondary | ICD-10-CM | POA: Diagnosis present

## 2015-04-22 DIAGNOSIS — I340 Nonrheumatic mitral (valve) insufficiency: Secondary | ICD-10-CM | POA: Diagnosis present

## 2015-04-22 DIAGNOSIS — R200 Anesthesia of skin: Secondary | ICD-10-CM

## 2015-04-22 HISTORY — DX: Heart failure, unspecified: I50.9

## 2015-04-22 HISTORY — DX: Essential (primary) hypertension: I10

## 2015-04-22 LAB — DIFFERENTIAL
BASOS ABS: 0 10*3/uL (ref 0.0–0.1)
Basophils Relative: 0 %
EOS PCT: 1 %
Eosinophils Absolute: 0 10*3/uL (ref 0.0–0.7)
Lymphocytes Relative: 12 %
Lymphs Abs: 0.5 10*3/uL — ABNORMAL LOW (ref 0.7–4.0)
MONO ABS: 0.6 10*3/uL (ref 0.1–1.0)
Monocytes Relative: 13 %
NEUTROS PCT: 74 %
Neutro Abs: 3.2 10*3/uL (ref 1.7–7.7)

## 2015-04-22 LAB — CBC
HEMATOCRIT: 27.6 % — AB (ref 39.0–52.0)
HEMOGLOBIN: 7.9 g/dL — AB (ref 13.0–17.0)
MCH: 22.1 pg — AB (ref 26.0–34.0)
MCHC: 28.6 g/dL — AB (ref 30.0–36.0)
MCV: 77.3 fL — ABNORMAL LOW (ref 78.0–100.0)
Platelets: 393 10*3/uL (ref 150–400)
RBC: 3.57 MIL/uL — ABNORMAL LOW (ref 4.22–5.81)
RDW: 21.7 % — ABNORMAL HIGH (ref 11.5–15.5)
WBC: 4.3 10*3/uL (ref 4.0–10.5)

## 2015-04-22 LAB — BASIC METABOLIC PANEL
ANION GAP: 5 (ref 5–15)
BUN: 14 mg/dL (ref 6–20)
CO2: 23 mmol/L (ref 22–32)
Calcium: 8.4 mg/dL — ABNORMAL LOW (ref 8.9–10.3)
Chloride: 111 mmol/L (ref 101–111)
Creatinine, Ser: 0.98 mg/dL (ref 0.61–1.24)
GFR calc Af Amer: >60 mL/min (ref >60)
GLUCOSE: 124 mg/dL — AB (ref 65–99)
POTASSIUM: 4.8 mmol/L (ref 3.5–5.1)
Sodium: 139 mmol/L (ref 135–145)

## 2015-04-22 LAB — URINE MICROSCOPIC-ADD ON

## 2015-04-22 LAB — RAPID URINE DRUG SCREEN, HOSP PERFORMED
Amphetamines: NOT DETECTED
Barbiturates: NOT DETECTED
Benzodiazepines: NOT DETECTED
Cocaine: NOT DETECTED
OPIATES: NOT DETECTED
TETRAHYDROCANNABINOL: NOT DETECTED

## 2015-04-22 LAB — URINALYSIS, ROUTINE W REFLEX MICROSCOPIC
BILIRUBIN URINE: NEGATIVE
GLUCOSE, UA: NEGATIVE mg/dL
KETONES UR: NEGATIVE mg/dL
Leukocytes, UA: NEGATIVE
NITRITE: NEGATIVE
PH: 5 (ref 5.0–8.0)
Protein, ur: NEGATIVE mg/dL
Specific Gravity, Urine: 1.013 (ref 1.005–1.030)

## 2015-04-22 LAB — HEPATIC FUNCTION PANEL
ALBUMIN: 2.5 g/dL — AB (ref 3.5–5.0)
ALK PHOS: 95 U/L (ref 38–126)
ALT: 22 U/L (ref 17–63)
AST: 31 U/L (ref 15–41)
BILIRUBIN DIRECT: 0.8 mg/dL — AB (ref 0.1–0.5)
BILIRUBIN INDIRECT: 0.9 mg/dL (ref 0.3–0.9)
BILIRUBIN TOTAL: 1.7 mg/dL — AB (ref 0.3–1.2)
Total Protein: 7.2 g/dL (ref 6.5–8.1)

## 2015-04-22 LAB — ABO/RH: ABO/RH(D): B POS

## 2015-04-22 LAB — I-STAT TROPONIN, ED: Troponin i, poc: 0.01 ng/mL (ref 0.00–0.08)

## 2015-04-22 LAB — BRAIN NATRIURETIC PEPTIDE: B Natriuretic Peptide: 2601 pg/mL — ABNORMAL HIGH (ref 0.0–100.0)

## 2015-04-22 LAB — MAGNESIUM: MAGNESIUM: 1.7 mg/dL (ref 1.7–2.4)

## 2015-04-22 LAB — POC OCCULT BLOOD, ED: Fecal Occult Bld: POSITIVE — AB

## 2015-04-22 MED ORDER — FUROSEMIDE 10 MG/ML IJ SOLN
40.0000 mg | Freq: Once | INTRAMUSCULAR | Status: AC
  Administered 2015-04-22: 40 mg via INTRAVENOUS
  Filled 2015-04-22: qty 4

## 2015-04-22 MED ORDER — HYDRALAZINE HCL 10 MG PO TABS
10.0000 mg | ORAL_TABLET | Freq: Three times a day (TID) | ORAL | Status: DC
  Administered 2015-04-22 – 2015-04-24 (×5): 10 mg via ORAL
  Filled 2015-04-22 (×6): qty 1

## 2015-04-22 MED ORDER — FUROSEMIDE 10 MG/ML IJ SOLN: 40.0000 mg | Freq: Once | INTRAMUSCULAR | Status: DC

## 2015-04-22 MED ORDER — FUROSEMIDE 10 MG/ML IJ SOLN
80.0000 mg | Freq: Two times a day (BID) | INTRAMUSCULAR | Status: DC
  Administered 2015-04-23: 80 mg via INTRAVENOUS
  Filled 2015-04-22: qty 8

## 2015-04-22 NOTE — ED Notes (Signed)
Pt states over the last couple of days he has had a sudden onset of fluid retention and shortness of breath. Pt has large amount of swelling in abdomen, arms and in groin area. Pt denies any chest pain. pt's breathing is labored but able to speak in complete sentences.

## 2015-04-22 NOTE — ED Notes (Signed)
PA at bedside with ultrasound.  

## 2015-04-22 NOTE — ED Provider Notes (Signed)
CSN: [id number]     Arrival date & time 04/22/15  1636 History   First MD Initiated Contact with Patient 04/22/15 1659     Chief Complaint  Patient presents with  . Shortness of Breath     (Consider location/radiation/quality/duration/timing/severity/associated sxs/prior Treatment) HPI Comments: Patient with history of congestive heart failure, not currently being treated for this (however normal ECHO in 2009), hep B, alcohol abuse per records -- presents with complaint of 3 days of body swelling, starting in his groin and spreading to his legs, trunk, and arms. He has had increasing shortness of breath. He states that he has had to sleep lying on an increased number of pillows. No change in urination, states that he has had incontinent sometimes when he bends over. No chest pain, fever. He has had increase in cough. Notes NSAID use and recent dark stools. No hematochezia. The onset of this condition was acute. The course is gradually worsening. Aggravating factors: activity, lying flat. Alleviating factors: none.    The history is provided by the patient.    Past Medical History  Diagnosis Date  . CHF (congestive heart failure) ([place])    History reviewed. No pertinent past surgical history. No family history on file. Social History  Substance Use Topics  . Smoking status: Never Smoker   . Smokeless tobacco: None  . Alcohol Use: No    Review of Systems  Constitutional: Negative for fever.  HENT: Negative for rhinorrhea and sore throat.   Eyes: Negative for redness.  Respiratory: Positive for cough and shortness of breath.   Cardiovascular: Positive for leg swelling. Negative for chest pain.  Gastrointestinal: Negative for nausea, vomiting, abdominal pain and diarrhea.  Genitourinary: Positive for enuresis. Negative for dysuria.  Musculoskeletal: Negative for myalgias.  Skin: Negative for rash.  Neurological: Negative for headaches.      Allergies  Lisinopril and  Penicillins  Home Medications   Prior to Admission medications   Not on File   BP 122/83 mmHg  Pulse 105  Temp(Src) 97.5 F (36.4 C) (Oral)  Resp 16  Ht 6' (1.829 m)  SpO2 95%   Physical Exam  Constitutional: He appears well-developed and well-nourished.  HENT:  Head: Normocephalic and atraumatic.  Mouth/Throat: Oropharynx is clear and moist.  Eyes: Conjunctivae are normal. Right eye exhibits no discharge. Left eye exhibits no discharge.  Neck: Normal range of motion. Neck supple. JVD present.  Cardiovascular: Regular rhythm and normal heart sounds.  Tachycardia present.   No murmur heard. Pulmonary/Chest: Effort normal. He has rales (bibasilar, mild).  Abdominal: Soft. There is no tenderness. There is no rebound and no guarding.  Musculoskeletal: He exhibits edema.  Anasarca, bilateral lower and upper pitting edema. Pitting edema of trunk and lower chest.   Neurological: He is alert.  Skin: Skin is warm and dry.  Psychiatric: He has a normal mood and affect.  Nursing note and vitals reviewed.   ED Course  Procedures (including critical care time) Labs Review Labs Reviewed  BASIC METABOLIC PANEL - Abnormal; Notable for the following:    Glucose, Bld 124 (*)    Calcium 8.4 (*)    All other components within normal limits  CBC - Abnormal; Notable for the following:    RBC 3.57 (*)    Hemoglobin 7.9 (*)    HCT 27.6 (*)    MCV 77.3 (*)    MCH 22.1 (*)    MCHC 28.6 (*)    RDW 21.7 (*)    All  other components within normal limits  BRAIN NATRIURETIC PEPTIDE - Abnormal; Notable for the following:    B Natriuretic Peptide 2601.0 (*)    All other components within normal limits  DIFFERENTIAL - Abnormal; Notable for the following:    Lymphs Abs 0.5 (*)    All other components within normal limits  POC OCCULT BLOOD, ED - Abnormal; Notable for the following:    Fecal Occult Bld POSITIVE (*)    All other components within normal limits  URINALYSIS, ROUTINE W REFLEX  MICROSCOPIC (NOT AT [facility])  HEPATIC FUNCTION PANEL  I-STAT TROPOININ, ED  I-STAT TROPOININ, ED  TYPE AND SCREEN    Imaging Review Dg Chest 2 View  04/22/2015  CLINICAL DATA:  Two week history of cough, congestion, weakness and upper extremity swelling. EXAM: CHEST  2 VIEW COMPARISON:  05/21/2009 FINDINGS: The heart is enlarged and somewhat accentuated by the AP projection. Given the shape of the heart could not exclude a pericardial effusion. There is mild tortuosity of the thoracic aorta. The lungs are clear. No pleural effusion. The bony thorax is grossly intact. IMPRESSION: Cardiac enlargement which represents a change since the prior study. Could not exclude a pericardial effusion. No acute pulmonary findings. Electronically Signed   By: Marijo Sanes M.D.   On: 04/22/2015 18:00   I have personally reviewed and evaluated these images and lab results as part of my medical decision-making.   5:05 PM Patient seen and examined. Work-up initiated. Discussed with Dr. Roxanne Mins.    Vital signs reviewed and are as follows: BP 122/83 mmHg  Pulse 105  Temp(Src) 97.5 F (36.4 C) (Oral)  Resp 16  Ht 6' (1.829 m)  SpO2 95%  I used bedside US to look at heart, subxiphoid view, no large pericardial effusion seen. Technically difficult given habitus and truncal edema.   ED ECG REPORT   Date: 04/22/2015  Rate: 109  Rhythm: sinus tachycardia  QRS Axis: right  Intervals: normal  ST/T Wave abnormalities: nonspecific ST changes  Conduction Disutrbances:none  Narrative Interpretation: low voltage  Old EKG Reviewed: none available  I have personally reviewed the EKG tracing and agree with the computerized printout as noted.  6:24 PM Hgb noted. Patient endorses using NSAIDs and having some dark stools recently.   7:26 PM Discussed with and seen by Dr. Roxanne Mins. Will admit.    7:42 PM Spoke with [facility] who will see. Temp orders placed. Patient is stable.    MDM   Final diagnoses:  Anasarca   Gastrointestinal hemorrhage, unspecified gastritis, unspecified gastrointestinal hemorrhage type  Iron deficiency anemia due to chronic blood loss   Admit.     Carlisle Cater, PA-C [id number]  Delora Fuel, MD [id number]

## 2015-04-22 NOTE — ED Notes (Signed)
Report attempted. RN unable to take call at this time.

## 2015-04-22 NOTE — ED Notes (Signed)
Pt off unit with xray 

## 2015-04-22 NOTE — H&P (Signed)
History and Physical  Patient Name: Kyle Conrad     [id number]    DOB: Sep 18, 1965    DOA: 04/22/2015 Referring physician: Delora Fuel, MD PCP: No PCP Per Patient      Chief Complaint: Shortness of breath and weight gain  HPI: Exzavier Conrad is a 49 y.o. male with a past medical history significant for HTN and NICM with episode of acute CHF in 2010 who presents with several months worsening dyspnea on exertion and weight gain.  The patient had an episode of congestive heart failure around 2010, had clean coronaries on cath with an EF of 20%, was treated heart failure clinic it appears for about one year, his EF returned around 60%, and then was lost to follow-up.  The patient reports to me that he was "all better" since then and so he didn't follow up with heart failure clinic or find a PCP. He was working as an Optometrist, and physically active (biking 30 miles twice a week and going to the gym). Around October of this year he lost his job, and it was in the context that he started to notice worsening shortness of breath.  Since that time, the patient has had progressive shortness of breath with exertion, increased swelling first in the ankles and legs, belly, arms, and scrotum, return of orthopnea, paroxysmal nocturnal dyspnea (he describes this as "feeling like my chest gets heavy when I lay down, and then it all filters out when I sit back up"). In the last week he has been having apple cider vinegar as a diuretic, but his symptoms progressed so he came to the ER.  In the ED, the patient was tachycardic, had a BNP 2601 pg per male, and new cardiomegaly on chest x-ray. His ECG showed sinus tachycardia. Of note the patient's hemoglobin was 7.9 g/dL and he was fecal occult blood positive. He denied hematemesis, melena, or frank bleeding from the rectum. He notes taking 1-2 aspirin or naproxen daily for a while given knee pain with his excess water weight.     Review of Systems:  Pt  complains of swelling, shortness of breath, mild right-sided stabbing chest discomfort, dizziness with exertion, one episode of painless hematochezia or year ago, orthopnea, paroxysmal nocturnal dyspnea. All other systems negative except as just noted or noted in the history of present illness.   Allergies:  Allergies  Allergen Reactions  . Lisinopril     REACTION: pt had a swelling episode.  His lips swelled  . Penicillins     Home medications: None since 2010  Past medical history: 1. NICM c/b systolic CHF untreated since 2010 2. HTN  Past surgical history: 1. Shoulder surgery  Family history:  Mother, lung cancer. No family history of autoimmune disease that the patient knows of. No family history of heart disease.   Social History:  Patient lives alone. He is currently out of work, but was formerly an Optometrist. He has no wife or children. He reports formerly exercising vigorously, but not since around October. He is a nonsmoker. He does not use illicit drugs. Denies alcohol use.        Physical Exam: BP 128/93 mmHg  Pulse 108  Temp(Src) 97.5 F (36.4 C) (Oral)  Resp 11  Ht 6' (1.829 m)  SpO2 94% General appearance: Well-developed, adult male, alert and in no acute distress.  Grossly swollen.  Pleasant mood.   Eyes: Icteric.     ENT: No nasal deformity, discharge, or epistaxis.  OP moist  without lesions.  Subungual jaundice. Skin: Warm and dry.   Cardiac: Tachy regular, nl S1-S2, no murmurs appreciated.  Capillary refill is brisk.  JVP not visible given habitus.  LE brawny edema.  Scrotal edema.  The arms have dependent pitting edema. Respiratory: Normal respiratory rate and rhythm.  Breath sounds distant.  No crackles definitely heard. Abdomen: Abdomen tense with ascites.   MSK: No deformities or effusions. Neuro: Sensorium intact and responding to questions, attention normal.  Speech is fluent.  Moves all extremities equally and with normal coordination.      Psych: Behavior appropriate.  Affect normal.  No evidence of aural or visual hallucinations or delusions.       Labs on Admission:  The metabolic panel shows normal sodium, potassium, bicarbonate, and renal function. The glucose is normal. The total and direct bilirubin are slightly elevated. Albumin is very low at 2.5 g/dL. The BNP is 2601 pg per male. Physical occult blood testing is positive. The troponin is negative. The complete blood count shows no leukocytosis or thrombocytopenia. Hemoglobin is 7.9 g/dL. The previous last was 14 in 2011..   Radiological Exams on Admission: Personally reviewed: Dg Chest 2 View 04/22/2015 Cardiomegaly marked.  Cephalization of vessels.    EKG: Independently reviewed. Sinus tachycardia.    Assessment/Plan 1. Acute CHF:  This is new.  Unclear type, ssytolic versus diastolic at this time, but presumed systolic again.  Has history NICM, 2010, resolved and not on meds and without follow up since then.   -Furosemide 80 mg IV twice daily -Echocardiogram -Consult to Cardiology, appreciate cares    2. HTN:  Hypertensive at admission.  Patient had angioedema with lisinopril.   -Imdur low dose now, titrate up per Cardiology  3. Anemia and positive fecal occult blood:  Without gross bleeding or melena.  NSAID use+.  Monitor for bleeding. -Transfusion threshold per Cardiology, informed consent obtained verbally with patient -LDH given elevated bilirubin and dipstick positive urine hemoglobin with normal micro -Retic count ordered -Iron studies and vitamin B12/folate ordered  4. Hypoalbuminemia:  Patient anasarcic, but urine dipstick negative.  Creatinine normal. -Urine protein/creatinine ratio    DVT PPx: SCDs given FOBT+ Diet: Cardiac Consultants: Cardiology Code Status: Full Family Communication: None  Medical decision making: What exists of the patient's previous chart was reviewed in depth and the case was discussed with Alecia Lemming, PA-C. Patient seen 8:34 PM on 04/22/2015.  Disposition Plan:  Admit for CHF.  Consultation with Cardiology and diuresis.  Anticipate 4+ days admission given degree of fluid overload.      Edwin Dada [facility] Pager [phone]

## 2015-04-23 ENCOUNTER — Ambulatory Visit (HOSPITAL_COMMUNITY): Payer: MEDICAID

## 2015-04-23 ENCOUNTER — Encounter (HOSPITAL_COMMUNITY): Payer: Self-pay | Admitting: Gastroenterology

## 2015-04-23 ENCOUNTER — Inpatient Hospital Stay (HOSPITAL_COMMUNITY): Payer: Self-pay

## 2015-04-23 DIAGNOSIS — I5021 Acute systolic (congestive) heart failure: Secondary | ICD-10-CM

## 2015-04-23 DIAGNOSIS — R601 Generalized edema: Secondary | ICD-10-CM | POA: Insufficient documentation

## 2015-04-23 LAB — VITAMIN B12
VITAMIN B 12: 986 pg/mL — AB (ref 180–914)
Vitamin B-12: 1019 pg/mL — ABNORMAL HIGH (ref 180–914)

## 2015-04-23 LAB — RETICULOCYTES
RBC.: 3.39 MIL/uL — ABNORMAL LOW (ref 4.22–5.81)
RBC.: 3.29 MIL/uL — ABNORMAL LOW (ref 4.22–5.81)
RETIC COUNT ABSOLUTE: 57.6 10*3/uL (ref 19.0–186.0)
RETIC CT PCT: 1.7 % (ref 0.4–3.1)
Retic Count, Absolute: 55.9 10*3/uL (ref 19.0–186.0)
Retic Ct Pct: 1.7 % (ref 0.4–3.1)

## 2015-04-23 LAB — PROTIME-INR
INR: 1.86 — ABNORMAL HIGH (ref 0.00–1.49)
Prothrombin Time: 21.4 seconds — ABNORMAL HIGH (ref 11.6–15.2)

## 2015-04-23 LAB — BASIC METABOLIC PANEL
ANION GAP: 8 (ref 5–15)
BUN: 11 mg/dL (ref 6–20)
CALCIUM: 8.3 mg/dL — AB (ref 8.9–10.3)
CHLORIDE: 107 mmol/L (ref 101–111)
CO2: 25 mmol/L (ref 22–32)
Creatinine, Ser: 1.04 mg/dL (ref 0.61–1.24)
GFR calc non Af Amer: >60 mL/min (ref >60)
GLUCOSE: 98 mg/dL (ref 65–99)
Potassium: 4 mmol/L (ref 3.5–5.1)
SODIUM: 140 mmol/L (ref 135–145)

## 2015-04-23 LAB — PREPARE RBC (CROSSMATCH)

## 2015-04-23 LAB — IRON AND TIBC
IRON: 14 ug/dL — AB (ref 45–182)
IRON: 17 ug/dL — AB (ref 45–182)
SATURATION RATIOS: 4 % — AB (ref 17.9–39.5)
SATURATION RATIOS: 5 % — AB (ref 17.9–39.5)
TIBC: 332 ug/dL (ref 250–450)
TIBC: 337 ug/dL (ref 250–450)
UIBC: 318 ug/dL
UIBC: 320 ug/dL

## 2015-04-23 LAB — LACTATE DEHYDROGENASE: LDH: 242 U/L — AB (ref 98–192)

## 2015-04-23 LAB — FERRITIN
Ferritin: 20 ng/mL — ABNORMAL LOW (ref 24–336)
Ferritin: 21 ng/mL — ABNORMAL LOW (ref 24–336)

## 2015-04-23 LAB — PROTEIN / CREATININE RATIO, URINE
Creatinine, Urine: 72.92 mg/dL
PROTEIN CREATININE RATIO: 0.4 mg/mg{creat} — AB (ref 0.00–0.15)
Total Protein, Urine: 29 mg/dL

## 2015-04-23 LAB — FOLATE
Folate: 15.7 ng/mL (ref >5.9)
Folate: 15.1 ng/mL (ref >5.9)

## 2015-04-23 MED ORDER — SODIUM CHLORIDE 0.9 % IV SOLN
8.0000 mg/h | INTRAVENOUS | Status: DC
  Administered 2015-04-23 – 2015-04-26 (×6): 8 mg/h via INTRAVENOUS
  Filled 2015-04-23 (×11): qty 80

## 2015-04-23 MED ORDER — BARIUM SULFATE 2.1 % PO SUSP
ORAL | Status: AC
  Filled 2015-04-23: qty 2

## 2015-04-23 MED ORDER — IOHEXOL 300 MG/ML  SOLN
100.0000 mL | Freq: Once | INTRAMUSCULAR | Status: AC | PRN
  Administered 2015-04-23: 100 mL via INTRAVENOUS

## 2015-04-23 MED ORDER — SODIUM CHLORIDE 0.9 % IV SOLN
80.0000 mg | Freq: Once | INTRAVENOUS | Status: AC
  Administered 2015-04-23: 80 mg via INTRAVENOUS
  Filled 2015-04-23: qty 80

## 2015-04-23 MED ORDER — SODIUM CHLORIDE 0.9 % IV SOLN
25.0000 mg | Freq: Once | INTRAVENOUS | Status: DC
  Filled 2015-04-23: qty 2

## 2015-04-23 MED ORDER — BARIUM SULFATE 2.1 % PO SUSP
900.0000 mL | Freq: Once | ORAL | Status: AC
  Administered 2015-04-23: 900 mL via ORAL

## 2015-04-23 MED ORDER — PANTOPRAZOLE SODIUM 40 MG IV SOLR
40.0000 mg | Freq: Two times a day (BID) | INTRAVENOUS | Status: DC
  Administered 2015-04-26 – 2015-04-29 (×6): 40 mg via INTRAVENOUS
  Filled 2015-04-23 (×7): qty 40

## 2015-04-23 MED ORDER — PHYTONADIONE 5 MG PO TABS
10.0000 mg | ORAL_TABLET | Freq: Once | ORAL | Status: AC
  Administered 2015-04-23: 10 mg via ORAL
  Filled 2015-04-23: qty 2

## 2015-04-23 MED ORDER — SODIUM CHLORIDE 0.9 % IV SOLN
Freq: Once | INTRAVENOUS | Status: AC
  Administered 2015-04-23: 21:00:00 via INTRAVENOUS

## 2015-04-23 MED ORDER — FUROSEMIDE 10 MG/ML IJ SOLN
40.0000 mg | Freq: Two times a day (BID) | INTRAMUSCULAR | Status: DC
  Administered 2015-04-23 – 2015-04-25 (×6): 40 mg via INTRAVENOUS
  Filled 2015-04-23 (×6): qty 4

## 2015-04-23 MED ORDER — NA FERRIC GLUC CPLX IN SUCROSE 12.5 MG/ML IV SOLN
125.0000 mg | Freq: Once | INTRAVENOUS | Status: DC
  Filled 2015-04-23: qty 10

## 2015-04-23 MED ORDER — MAGNESIUM OXIDE 400 (241.3 MG) MG PO TABS
400.0000 mg | ORAL_TABLET | Freq: Two times a day (BID) | ORAL | Status: DC
  Administered 2015-04-23 – 2015-05-02 (×18): 400 mg via ORAL
  Filled 2015-04-23 (×18): qty 1

## 2015-04-23 NOTE — Progress Notes (Signed)
PICC line unable to be placed today, IV consult placed for 2nd IV for blood admin this afternoon per Abrol, MD. WIll start FE infusion after.

## 2015-04-23 NOTE — Consult Note (Addendum)
Reason for Consult: Anemia Referring Physician: Hospital team  Kyle Conrad is an 49 y.o. male.  HPI: Patient seen and examined and his hospital computer chart was reviewed and he really does not have much GI complaints and has not had any recent GI workup and his family history is negative for any GI problems but he has been taking some aspirin and he does not smoke and only rarely drinks his stools have been a little darker than usual and he is not aware of his weight gain and he was admitted for shortness of breath and is currently feeling better and has no other complaints  Past Medical History  Diagnosis Date  . CHF (congestive heart failure) ([facility])   . Hypertension     Past Surgical History  Procedure Laterality Date  . Shoulder surgery      Family History  Problem Relation Age of Onset  . Lung cancer Mother     Social History:  reports that he has never smoked. He does not have any smokeless tobacco history on file. He reports that he does not drink alcohol or use illicit drugs.  Allergies:  Allergies  Allergen Reactions  . Lisinopril     REACTION: pt had a swelling episode.  His lips swelled  . Penicillins     Medications: I have reviewed the patient's current medications.  Results for orders placed or performed during the hospital encounter of 04/22/15 (from the past 48 hour(s))  Protein / creatinine ratio, urine     Status: Abnormal   Collection Time: 04/22/15  7:57 AM  Result Value Ref Range   Creatinine, Urine 72.92 mg/dL   Total Protein, Urine 29 mg/dL    Comment: NO NORMAL RANGE ESTABLISHED FOR THIS TEST   Protein Creatinine Ratio 0.40 (H) 0.00 - 0.15 mg/mg[Cre]  Brain natriuretic peptide     Status: Abnormal   Collection Time: 04/22/15  5:43 PM  Result Value Ref Range   B Natriuretic Peptide 2601.0 (H) 0.0 - 100.0 pg/mL  Basic metabolic panel     Status: Abnormal   Collection Time: 04/22/15  5:44 PM  Result Value Ref Range   Sodium 139 135 - 145  mmol/L   Potassium 4.8 3.5 - 5.1 mmol/L   Chloride 111 101 - 111 mmol/L   CO2 23 22 - 32 mmol/L   Glucose, Bld 124 (H) 65 - 99 mg/dL   BUN 14 6 - 20 mg/dL   Creatinine, Ser 6.47 0.61 - 1.24 mg/dL   Calcium 8.4 (L) 8.9 - 10.3 mg/dL   GFR calc non Af Amer >60 >60 mL/min   GFR calc Af Amer >60 >60 mL/min    Comment: (NOTE) The eGFR has been calculated using the CKD EPI equation. This calculation has not been validated in all clinical situations. eGFR's persistently <60 mL/min signify possible Chronic Kidney Disease.    Anion gap 5 5 - 15  CBC     Status: Abnormal   Collection Time: 04/22/15  5:44 PM  Result Value Ref Range   WBC 4.3 4.0 - 10.5 K/uL   RBC 3.57 (L) 4.22 - 5.81 MIL/uL   Hemoglobin 7.9 (L) 13.0 - 17.0 g/dL   HCT 04.3 (L) 55.7 - 80.0 %   MCV 77.3 (L) 78.0 - 100.0 fL   MCH 22.1 (L) 26.0 - 34.0 pg   MCHC 28.6 (L) 30.0 - 36.0 g/dL   RDW 63.2 (H) 15.3 - 19.8 %   Platelets 393 150 - 400 K/uL  Differential     Status: Abnormal   Collection Time: 04/22/15  5:44 PM  Result Value Ref Range   Neutrophils Relative % 74 %   Lymphocytes Relative 12 %   Monocytes Relative 13 %   Eosinophils Relative 1 %   Basophils Relative 0 %   Neutro Abs 3.2 1.7 - 7.7 K/uL   Lymphs Abs 0.5 (L) 0.7 - 4.0 K/uL   Monocytes Absolute 0.6 0.1 - 1.0 K/uL   Eosinophils Absolute 0.0 0.0 - 0.7 K/uL   Basophils Absolute 0.0 0.0 - 0.1 K/uL   RBC Morphology POLYCHROMASIA PRESENT     Comment: TARGET CELLS  POC occult blood, ED RN will collect     Status: Abnormal   Collection Time: 04/22/15  6:19 PM  Result Value Ref Range   Fecal Occult Bld POSITIVE (A) NEGATIVE  I-stat troponin, ED (not at [facility], [facility])     Status: None   Collection Time: 04/22/15  7:12 PM  Result Value Ref Range   Troponin i, poc 0.01 0.00 - 0.08 ng/mL   Comment 3            Comment: Due to the release kinetics of cTnI, a negative result within the first hours of the onset of symptoms does not rule out myocardial infarction  with certainty. If myocardial infarction is still suspected, repeat the test at appropriate intervals.   Type and screen     Status: None (Preliminary result)   Collection Time: 04/22/15  7:34 PM  Result Value Ref Range   ABO/RH(D) B POS    Antibody Screen NEG    Sample Expiration 04/25/2015    Unit Number [id number]    Blood Component Type RED CELLS,LR    Unit division 00    Status of Unit ALLOCATED    Transfusion Status OK TO TRANSFUSE    Crossmatch Result Compatible   Hepatic function panel     Status: Abnormal   Collection Time: 04/22/15  7:34 PM  Result Value Ref Range   Total Protein 7.2 6.5 - 8.1 g/dL   Albumin 2.5 (L) 3.5 - 5.0 g/dL   AST 31 15 - 41 U/L   ALT 22 17 - 63 U/L   Alkaline Phosphatase 95 38 - 126 U/L   Total Bilirubin 1.7 (H) 0.3 - 1.2 mg/dL   Bilirubin, Direct 0.8 (H) 0.1 - 0.5 mg/dL   Indirect Bilirubin 0.9 0.3 - 0.9 mg/dL  ABO/Rh     Status: None   Collection Time: 04/22/15  7:34 PM  Result Value Ref Range   ABO/RH(D) B POS   Urinalysis, Routine w reflex microscopic     Status: Abnormal   Collection Time: 04/22/15  7:48 PM  Result Value Ref Range   Color, Urine AMBER (A) YELLOW    Comment: BIOCHEMICALS MAY BE AFFECTED BY COLOR   APPearance CLEAR CLEAR   Specific Gravity, Urine 1.013 1.005 - 1.030   pH 5.0 5.0 - 8.0   Glucose, UA NEGATIVE NEGATIVE mg/dL   Hgb urine dipstick MODERATE (A) NEGATIVE   Bilirubin Urine NEGATIVE NEGATIVE   Ketones, ur NEGATIVE NEGATIVE mg/dL   Protein, ur NEGATIVE NEGATIVE mg/dL   Nitrite NEGATIVE NEGATIVE   Leukocytes, UA NEGATIVE NEGATIVE  Urine rapid drug screen (hosp performed)     Status: None   Collection Time: 04/22/15  7:48 PM  Result Value Ref Range   Opiates NONE DETECTED NONE DETECTED   Cocaine NONE DETECTED NONE DETECTED   Benzodiazepines NONE DETECTED NONE  DETECTED   Amphetamines NONE DETECTED NONE DETECTED   Tetrahydrocannabinol NONE DETECTED NONE DETECTED   Barbiturates NONE DETECTED NONE DETECTED     Comment:        DRUG SCREEN FOR MEDICAL PURPOSES ONLY.  IF CONFIRMATION IS NEEDED FOR ANY PURPOSE, NOTIFY LAB WITHIN 5 DAYS.        LOWEST DETECTABLE LIMITS FOR URINE DRUG SCREEN Drug Class       Cutoff (ng/mL) Amphetamine      1000 Barbiturate      200 Benzodiazepine   482 Tricyclics       707 Opiates          300 Cocaine          300 THC              50   Urine microscopic-add on     Status: Abnormal   Collection Time: 04/22/15  7:48 PM  Result Value Ref Range   Squamous Epithelial / LPF 0-5 (A) NONE SEEN   WBC, UA 0-5 0 - 5 WBC/hpf   RBC / HPF 0-5 0 - 5 RBC/hpf   Bacteria, UA RARE (A) NONE SEEN   Casts HYALINE CASTS (A) NEGATIVE  Magnesium     Status: None   Collection Time: 04/22/15 10:02 PM  Result Value Ref Range   Magnesium 1.7 1.7 - 2.4 mg/dL  Iron and TIBC     Status: Abnormal   Collection Time: 04/23/15  3:55 AM  Result Value Ref Range   Iron 14 (L) 45 - 182 ug/dL   TIBC 332 250 - 450 ug/dL   Saturation Ratios 4 (L) 17.9 - 39.5 %   UIBC 318 ug/dL  Ferritin     Status: Abnormal   Collection Time: 04/23/15  3:55 AM  Result Value Ref Range   Ferritin 20 (L) 24 - 336 ng/mL  Folate     Status: None   Collection Time: 04/23/15  3:55 AM  Result Value Ref Range   Folate 15.7 >5.9 ng/mL  Vitamin B12     Status: Abnormal   Collection Time: 04/23/15  3:55 AM  Result Value Ref Range   Vitamin B-12 1019 (H) 180 - 914 pg/mL    Comment: (NOTE) This assay is not validated for testing neonatal or myeloproliferative syndrome specimens for Vitamin B12 levels.   Reticulocytes     Status: Abnormal   Collection Time: 04/23/15  3:55 AM  Result Value Ref Range   Retic Ct Pct 1.7 0.4 - 3.1 %   RBC. 3.39 (L) 4.22 - 5.81 MIL/uL   Retic Count, Manual 57.6 19.0 - 186.0 K/uL  Lactate dehydrogenase     Status: Abnormal   Collection Time: 04/23/15  3:55 AM  Result Value Ref Range   LDH 242 (H) 98 - 192 U/L  Basic metabolic panel     Status: Abnormal   Collection Time:  04/23/15  3:55 AM  Result Value Ref Range   Sodium 140 135 - 145 mmol/L   Potassium 4.0 3.5 - 5.1 mmol/L   Chloride 107 101 - 111 mmol/L   CO2 25 22 - 32 mmol/L   Glucose, Bld 98 65 - 99 mg/dL   BUN 11 6 - 20 mg/dL   Creatinine, Ser 1.04 0.61 - 1.24 mg/dL   Calcium 8.3 (L) 8.9 - 10.3 mg/dL   GFR calc non Af Amer >60 >60 mL/min   GFR calc Af Amer >60 >60 mL/min    Comment: (NOTE) The eGFR  has been calculated using the CKD EPI equation. This calculation has not been validated in all clinical situations. eGFR's persistently <60 mL/min signify possible Chronic Kidney Disease.    Anion gap 8 5 - 15  Protime-INR     Status: Abnormal   Collection Time: 04/23/15  3:55 AM  Result Value Ref Range   Prothrombin Time 21.4 (H) 11.6 - 15.2 seconds   INR 1.86 (H) 0.00 - 1.49  Vitamin B12     Status: Abnormal   Collection Time: 04/23/15 10:16 AM  Result Value Ref Range   Vitamin B-12 986 (H) 180 - 914 pg/mL    Comment: (NOTE) This assay is not validated for testing neonatal or myeloproliferative syndrome specimens for Vitamin B12 levels.   Folate     Status: None   Collection Time: 04/23/15 10:16 AM  Result Value Ref Range   Folate 15.1 >5.9 ng/mL  Iron and TIBC     Status: Abnormal   Collection Time: 04/23/15 10:16 AM  Result Value Ref Range   Iron 17 (L) 45 - 182 ug/dL   TIBC 337 250 - 450 ug/dL   Saturation Ratios 5 (L) 17.9 - 39.5 %   UIBC 320 ug/dL  Ferritin     Status: Abnormal   Collection Time: 04/23/15 10:16 AM  Result Value Ref Range   Ferritin 21 (L) 24 - 336 ng/mL  Reticulocytes     Status: Abnormal   Collection Time: 04/23/15 10:16 AM  Result Value Ref Range   Retic Ct Pct 1.7 0.4 - 3.1 %   RBC. 3.29 (L) 4.22 - 5.81 MIL/uL   Retic Count, Manual 55.9 19.0 - 186.0 K/uL  Prepare RBC     Status: None   Collection Time: 04/23/15 10:44 AM  Result Value Ref Range   Order Confirmation ORDER PROCESSED BY BLOOD BANK     Dg Chest 2 View  04/22/2015  CLINICAL DATA:  Two  week history of cough, congestion, weakness and upper extremity swelling. EXAM: CHEST  2 VIEW COMPARISON:  05/21/2009 FINDINGS: The heart is enlarged and somewhat accentuated by the AP projection. Given the shape of the heart could not exclude a pericardial effusion. There is mild tortuosity of the thoracic aorta. The lungs are clear. No pleural effusion. The bony thorax is grossly intact. IMPRESSION: Cardiac enlargement which represents a change since the prior study. Could not exclude a pericardial effusion. No acute pulmonary findings. Electronically Signed   By: Marijo Sanes M.D.   On: 04/22/2015 18:00    ROS negative except above Blood pressure 111/72, pulse 98, temperature 98 F (36.7 C), temperature source Oral, resp. rate 18, height 6' (1.829 m), weight 127.3 kg (280 lb 10.3 oz), SpO2 100 %. Physical Exam vital signs stable afebrile no acute distress he does have some expiratory wheezes decreased heart sounds abdomen is soft nontender guaiac positive labs reviewed pertinent for being iron deficient increased INR questionable etiology  Assessment/Plan: Iron deficiency with guaiac positivity Plan: Will begin workup with an abdominal pelvic CAT scan to rule out anything significant and evaluate his liver and then when okay with cardiology will need endoscopy and/or colonoscopy at that point and will follow with you and certainly his GI workup could be done as an outpatient if needed  [facility] E 04/23/2015, 3:32 PM

## 2015-04-23 NOTE — Care Management Note (Addendum)
Case Management Note  Patient Details  Name: Kyle Conrad MRN: [id number] Date of Birth: 09-27-1965  Subjective/Objective:      Pt admitted with acute CHF              Action/Plan:  Pt is independent from home.  Per pt; diagnosis of CHF has been >3 years.  CM assessed pt for HF screen; pt stated he has a scale and will begin weight self daily, pt states he adheres to low sodium diet.  Pt stated he had to walk with a cane prior to admit, PT eval will be ordered.  Pt confirmed he does not have health insurance nor PCP, pt agreed for CM to initiate [facility] services.   Expected Discharge Date:                  Expected Discharge Plan:     In-House Referral:     Discharge planning Services  CM Consult  Post Acute Care Choice:    Choice offered to:     DME Arranged:    DME Agency:     HH Arranged:    HH Agency:     Status of Service:  In process, will continue to follow  Medicare Important Message Given:    Date Medicare IM Given:    Medicare IM give by:    Date Additional Medicare IM Given:    Additional Medicare Important Message give by:     If discussed at [place] of Stay Meetings, dates discussed:    Additional Comments: CM assessed pt.  Pt [place] appt 04/30/15 at 10:30am.  CM provided pt [facility] brochure for the sickle cell location.  Pt made aware that he will be able to fill scripts at clinic immediately post discharge and to apply for medication assistance during intial appt. Maryclare Labrador, RN 04/23/2015, 11:22 AM

## 2015-04-23 NOTE — Consult Note (Signed)
Patient ID: Kyle Conrad MRN: [id number], DOB/AGE: 01/01/1966   Admit date: 04/22/2015   Primary Physician: No PCP Per Patient Primary Cardiologist: Previously Dr. Caryl Comes (last seen in 2010)  Pt. Profile:  49 y/o male with previous h/o nonischemic cardiomyopathy with improvement in EF from 15-20% to 60% in 2010, admitted for acute CHF exacerbation, also discovered to have new microcytic anemia.   Problem List  Past Medical History  Diagnosis Date  . CHF (congestive heart failure) ([place])   . Hypertension     Past Surgical History  Procedure Laterality Date  . Shoulder surgery       Allergies  Allergies  Allergen Reactions  . Lisinopril     REACTION: pt had a swelling episode.  His lips swelled  . Penicillins     HPI  49 y/o male, previously followed by Dr. Caryl Comes. He has not followed up since 2010. He has a h/o nonischemic cardiomyopathy/ systolic CHF, with EF as low as 15-20% in 2010. Subsequent LHC by Dr. Angelena Form 09/11/2008 showed no angiographic evidence of coronary disease. Elevated filling pressures with reduced cardiac output was noted. He was placed on guidelines directed medical therapy with Coreg, Imdur and hydralazine. He was not treated with an ACE/ARB at that time given abnormal renal function. Repeat 2D echo in July 2015 showed improvement in EF back to 40%. With improvement in EF, there was no indication for ICD implantation. He had another 2D echo in Oct 2010 which showed further improvement with EF at 60%. Since that time, he has failed to f/u routinely.   He presented to the [facility] ED 04/22/15 with complaint of several months of worsening dyspnea on exertion and weight gain. He notes 20-30 lb gradual weight gain since October. He initially developed LEE around his ankles that has progressed upwards. Now with anasarca with edema up his thighs, distended abdomen + significant RUE edema. He notes associated orthopnea and PND. He admits that he stopped all of his HF  meds in 2011 because he felt he no longer needed to take them.   BNP on admit was 2601. CXR showed enlarged cardiac silhouette. Lungs were clear w/o pleural effusion. Additional labs show microcytic anemia with Hgb at 7.9 (previously 14.5 in 2011). FOBT +. Further anemic w/u pending. Admission EKG shows sinus tach with rate of 109 and low voltage QRS complexes. His weight today is 280 lb this is compared to 259 lb at his last OV in 2010.   He has been admitted by IM. He is currently on 40 mg IV lasix BID and 10 mg of hydralazine Q8H. He is also on IV Protonix and IV Fe. Renal function is normal with SCr at 1.04.   Cardiology has been consulted for CHF management.      Home Medications  Prior to Admission medications   Not on File    Family History  Family History  Problem Relation Age of Onset  . Lung cancer Mother     Social History  Social History   Social History  . Marital Status: Single    Spouse Name: N/A  . Number of Children: N/A  . Years of Education: N/A   Occupational History  . Not on file.   Social History Main Topics  . Smoking status: Never Smoker   . Smokeless tobacco: Not on file  . Alcohol Use: No  . Drug Use: No  . Sexual Activity: Not on file   Other Topics Concern  . Not on  file   Social History Narrative  . No narrative on file     Review of Systems General:  No chills, fever, night sweats or weight changes.  Cardiovascular:  No chest pain, +dyspnea on exertion, +edema, +orthopnea, no palpitations, +paroxysmal nocturnal dyspnea. Dermatological: No rash, lesions/masses Respiratory: No cough, dyspnea Urologic: No hematuria, dysuria Abdominal:   No nausea, vomiting, diarrhea, bright red blood per rectum, melena, or hematemesis Neurologic:  No visual changes, wkns, changes in mental status. All other systems reviewed and are otherwise negative except as noted above.  Physical Exam  Blood pressure 111/72, pulse 98, temperature 98 F  (36.7 C), temperature source Oral, resp. rate 18, height 6' (1.829 m), weight 280 lb 10.3 oz (127.3 kg), SpO2 100 %.  General: Pleasant, NAD, obese/ anasarca  Psych: Normal affect. Neuro: Alert and oriented X 3. Moves all extremities spontaneously. HEENT: Normal  Neck: Supple without bruits + elevated JVD. Lungs:  Resp regular and unlabored, deceased BS bilaterally with faint basilar rales, R>L Heart: RRR no s3, s4, or murmurs. Abdomen:  non-tender, distended, BS + x 4.  Extremities: No clubbing. 2+ bilateral LEE.  DP/PT/Radials 2+ and equal bilaterally. RUE edematous   Labs  Troponin (Point of Care Test)  Recent Labs  04/22/15 1912  TROPIPOC 0.01   No results for input(s): CKTOTAL, CKMB, TROPONINI in the last 72 hours. Lab Results  Component Value Date   WBC 4.3 04/22/2015   HGB 7.9* 04/22/2015   HCT 27.6* 04/22/2015   MCV 77.3* 04/22/2015   PLT 393 04/22/2015    Recent Labs Lab 04/22/15 1934 04/23/15 0355  NA  --  140  K  --  4.0  CL  --  107  CO2  --  25  BUN  --  11  CREATININE  --  1.04  CALCIUM  --  8.3*  PROT 7.2  --   BILITOT 1.7*  --   ALKPHOS 95  --   ALT 22  --   AST 31  --   GLUCOSE  --  98   Lab Results  Component Value Date   CHOL  09/08/2008    148        ATP III CLASSIFICATION:  <200     mg/dL   Desirable  200-239  mg/dL   Borderline High  >=240    mg/dL   High          HDL 32* 09/08/2008   LDLCALC  09/08/2008    96        Total Cholesterol/HDL:CHD Risk Coronary Heart Disease Risk Table                     Men   Women  1/2 Average Risk   3.4   3.3  Average Risk       5.0   4.4  2 X Average Risk   9.6   7.1  3 X Average Risk  23.4   11.0        Use the calculated Patient Ratio above and the CHD Risk Table to determine the patient's CHD Risk.        ATP III CLASSIFICATION (LDL):  <100     mg/dL   Optimal  100-129  mg/dL   Near or Above                    Optimal  130-159  mg/dL   Borderline  160-189  mg/dL   High  >  190      mg/dL   Very High   TRIG 100 09/08/2008   Lab Results  Component Value Date   DDIMER * 09/08/2008    0.64        AT THE INHOUSE ESTABLISHED CUTOFF VALUE OF 0.48 ug/mL FEU, THIS ASSAY HAS BEEN DOCUMENTED IN THE LITERATURE TO HAVE A SENSITIVITY AND NEGATIVE PREDICTIVE VALUE OF AT LEAST 98 TO 99%.  THE TEST RESULT SHOULD BE CORRELATED WITH AN ASSESSMENT OF THE CLINICAL PROBABILITY OF DVT / VTE.     Radiology/Studies  Dg Chest 2 View  04/22/2015  CLINICAL DATA:  Two week history of cough, congestion, weakness and upper extremity swelling. EXAM: CHEST  2 VIEW COMPARISON:  05/21/2009 FINDINGS: The heart is enlarged and somewhat accentuated by the AP projection. Given the shape of the heart could not exclude a pericardial effusion. There is mild tortuosity of the thoracic aorta. The lungs are clear. No pleural effusion. The bony thorax is grossly intact. IMPRESSION: Cardiac enlargement which represents a change since the prior study. Could not exclude a pericardial effusion. No acute pulmonary findings. Electronically Signed   By: Marijo Sanes M.D.   On: 04/22/2015 18:00    ECG  Sinus tachycardia. 109 bpm. Low voltage QRS complexes.   Echocardiogram - pending.    ASSESSMENT AND PLAN  Principal Problem:   Acute congestive heart failure (HCC) Active Problems:   Obstructive sleep apnea   HYPERTENSION, BENIGN   Anemia   Anasarca  1. Acute CHF Exacerbation: in the setting of medication noncompliance. He has a previous h/o NICM with prior EF of 15-20% in 2010. Normal cath. EF improved to 60% in 02/2009 with medical therapy. Patient has been off all meds since 2011. He is markedly volume overloaded/ with anasarca. F/u 2D echo pending. Renal function is stable. Continue IV lasix BID. He is currently on 40 mg. I/Os negative 3.2 L thus far. May consider increasing dose for more aggressive diuresis. Monitor K closely. Agree with hydralazine. Hold initiation of BB until he reaches euvolemic  state. Continue strict I/Os, daily weights, daily BMPs and low sodium diet.   2. HTN: currently controlled. He was hypertensive on admit with pressure of 150/124. Most recent pressure was 111/27. Continue lasix and hydralazine. If echo shows low EF, will need additional HF meds including an ACE/ARB, if renal function and HR allows. Also will need re-initiation of BB, after he reaches euvolemic state.  3. Medication Noncompliance: I've discussed importance of medication adherence given his h/o NICM. He verbalized understanding.   4. Microcytic Anemia: additional f/u pending. IM following.    Janee Morn, PA-C 04/23/2015, 11:59 AM Pager: [phone]

## 2015-04-23 NOTE — Progress Notes (Signed)
[facility] PROGRESS NOTE  Kyle Conrad [id number] DOB: 04/20/1966 DOA: 04/22/2015 PCP: No PCP Per Patient  Length of stay: 1   Assessment/Plan: Principal Problem:   Acute congestive heart failure ([place]) Active Problems:   Obstructive sleep apnea   HYPERTENSION, BENIGN   Anemia   Anasarca    1. Acute CHF:  Last 2-D echo in 10/ 2010, showed EF of 60%. Repeat 2-D echo pending. sytolic versus diastolic ?  at this time, but presumed systolic again. Has history NICM, 2010, resolved and not on meds and without follow up since then.  - continue Furosemide 40  mg IV twice daily -Echocardiogram -Consult to Cardiology, appreciate there input  CT chest/PE protocol pending at this time   2. HTN:  Hypertensive at admission. Patient had angioedema with lisinopril.  -Imdur low dose now, titrate up per Cardiology   3. Microcytic Anemia and positive fecal occult blood:  Without gross bleeding or melena., But patient endorses dark stools for the last 2-3 weeks Intermittent NSAID use+. Presented with a hemoglobin of 7.9, INR 1.86 Started patient on Protonix Transfuse 1 unit of packed red blood cells Repeat CBC tomorrow, currently no active bleeding Serum iron 14, will benefit from IV iron [facility] gastroenterology consulted for EGD?  4. Hypoalbuminemia:  Patient anasarcic, but urine dipstick negative. Creatinine normal. -Urine protein/creatinine ratio  5. Right upper extremity swelling Obtain venous and arterial Doppler to rule out DVT and arterial thrombus  6. Coagulopathy, does patient have underlying cirrhosis, not sure, CT of the abdomen and pelvis pending at this time, will give 1 dose of vitamin K      DVT prophylaxsis scd's   Code Status:      Code Status Orders        Start     Ordered   04/22/15 2130  Full code   Continuous     04/22/15 2129      Family Communication: Discussed in detail with the patient, all imaging results, lab  results explained to the patient   Disposition Plan:  3-4 days   Brief narrative:  49 y.o. male with a past medical history significant for HTN and NICM with episode of acute CHF in 2010 who presents with several months worsening dyspnea on exertion and weight gain.  The patient had an episode of congestive heart failure around 2010, had clean coronaries on cath with an EF of 20%, was treated heart failure clinic it appears for about one year, his EF returned around 60%, and then was lost to follow-up.  The patient reports to me that he was "all better" since then and so he didn't follow up with heart failure clinic or find a PCP. He was working as an Optometrist, and physically active (biking 30 miles twice a week and going to the gym). Around October of this year he lost his job, and it was in the context that he started to notice worsening shortness of breath.  Since that time, the patient has had progressive shortness of breath with exertion, increased swelling first in the ankles and legs, belly, arms, and scrotum, return of orthopnea, paroxysmal nocturnal dyspnea (he describes this as "feeling like my chest gets heavy when I lay down, and then it all filters out when I sit back up"). In the last week he has been having apple cider vinegar as a diuretic, but his symptoms progressed so he came to the ER.  In the ED, the patient was tachycardic,  had a BNP 2601 pg per male, and new cardiomegaly on chest x-ray. His ECG showed sinus tachycardia. Of note the patient's hemoglobin was 7.9 g/dL and he was fecal occult blood positive. He denied hematemesis, melena, or frank bleeding from the rectum. He notes taking 1-2 aspirin or naproxen daily for a while given knee pain with his excess water weight.  Consultants:  GI  Cardiology   Procedures:  None   Antibiotics: Anti-infectives    None         HPI/Subjective: generalized swelling ,weakness , sob , no chest pain   Objective: Filed  Vitals:   04/22/15 2030 04/22/15 2045 04/22/15 2126 04/23/15 0427  BP: 139/118 141/107 140/106 111/72  Pulse: 25 101  98  Temp:   97.5 F (36.4 C) 98 F (36.7 C)  TempSrc:   Oral Oral  Resp: 14 14 18 18   Height:      Weight:   130.6 kg (287 lb 14.7 oz) 127.3 kg (280 lb 10.3 oz)  SpO2: 89% 96% 100% 100%    Intake/Output Summary (Last 24 hours) at 04/23/15 1026 Last data filed at 04/23/15 [id number]  Gross per 24 hour  Intake    730 ml  Output   3890 ml  Net  -3160 ml    Exam:  General: No acute respiratory distress Lungs: Clear to auscultation bilaterally without wheezes or crackles Cardiovascular: Regular rate and rhythm without murmur gallop or rub normal S1 and S2 Abdomen: Nontender, nondistended, soft, bowel sounds positive, no rebound, no ascites, no appreciable mass Extremities: No significant cyanosis, clubbing, or edema bilateral lower extremities. Swelling right UE      Data Review   Micro Results No results found for this or any previous visit (from the past 240 hour(s)).  Radiology Reports Dg Chest 2 View  04/22/2015  CLINICAL DATA:  Two week history of cough, congestion, weakness and upper extremity swelling. EXAM: CHEST  2 VIEW COMPARISON:  05/21/2009 FINDINGS: The heart is enlarged and somewhat accentuated by the AP projection. Given the shape of the heart could not exclude a pericardial effusion. There is mild tortuosity of the thoracic aorta. The lungs are clear. No pleural effusion. The bony thorax is grossly intact. IMPRESSION: Cardiac enlargement which represents a change since the prior study. Could not exclude a pericardial effusion. No acute pulmonary findings. Electronically Signed   By: Marijo Sanes M.D.   On: 04/22/2015 18:00     CBC  Recent Labs Lab 04/22/15 1744  WBC 4.3  HGB 7.9*  HCT 27.6*  PLT 393  MCV 77.3*  MCH 22.1*  MCHC 28.6*  RDW 21.7*  LYMPHSABS 0.5*  MONOABS 0.6  EOSABS 0.0  BASOSABS 0.0    Chemistries   Recent Labs Lab  04/22/15 1744 04/22/15 1934 04/22/15 2202 04/23/15 0355  NA 139  --   --  140  K 4.8  --   --  4.0  CL 111  --   --  107  CO2 23  --   --  25  GLUCOSE 124*  --   --  98  BUN 14  --   --  11  CREATININE 0.98  --   --  1.04  CALCIUM 8.4*  --   --  8.3*  MG  --   --  1.7  --   AST  --  31  --   --   ALT  --  22  --   --   ALKPHOS  --  95  --   --   BILITOT  --  1.7*  --   --    ------------------------------------------------------------------------------------------------------------------ estimated creatinine clearance is 115.9 mL/min (by C-G formula based on Cr of 1.04). ------------------------------------------------------------------------------------------------------------------ No results for input(s): HGBA1C in the last 72 hours. ------------------------------------------------------------------------------------------------------------------ No results for input(s): CHOL, HDL, LDLCALC, TRIG, CHOLHDL, LDLDIRECT in the last 72 hours. ------------------------------------------------------------------------------------------------------------------ No results for input(s): TSH, T4TOTAL, T3FREE, THYROIDAB in the last 72 hours.  Invalid input(s): FREET3 ------------------------------------------------------------------------------------------------------------------  Recent Labs  04/23/15 0355  VITAMINB12 1019*  FOLATE 15.7  FERRITIN 20*  TIBC 332  IRON 14*  RETICCTPCT 1.7    Coagulation profile  Recent Labs Lab 04/23/15 0355  INR 1.86*    No results for input(s): DDIMER in the last 72 hours.  Cardiac Enzymes No results for input(s): CKMB, TROPONINI, MYOGLOBIN in the last 168 hours.  Invalid input(s): CK ------------------------------------------------------------------------------------------------------------------ Invalid input(s): POCBNP   CBG: No results for input(s): GLUCAP in the last 168 hours.     Studies: Dg Chest 2 View  04/22/2015   CLINICAL DATA:  Two week history of cough, congestion, weakness and upper extremity swelling. EXAM: CHEST  2 VIEW COMPARISON:  05/21/2009 FINDINGS: The heart is enlarged and somewhat accentuated by the AP projection. Given the shape of the heart could not exclude a pericardial effusion. There is mild tortuosity of the thoracic aorta. The lungs are clear. No pleural effusion. The bony thorax is grossly intact. IMPRESSION: Cardiac enlargement which represents a change since the prior study. Could not exclude a pericardial effusion. No acute pulmonary findings. Electronically Signed   By: Marijo Sanes M.D.   On: 04/22/2015 18:00      Lab Results  Component Value Date   HGBA1C  09/10/2008    6.0 (NOTE) The ADA recommends the following therapeutic goal for glycemic control related to Hgb A1c measurement: Goal of therapy: <6.5 Hgb A1c  Reference: American Diabetes Association: Clinical Practice Recommendations 2010, Diabetes Care, 2010, 33: (Suppl  1).   Lab Results  Component Value Date   [facility]  09/08/2008    96        Total Cholesterol/HDL:CHD Risk Coronary Heart Disease Risk Table                     Men   Women  1/2 Average Risk   3.4   3.3  Average Risk       5.0   4.4  2 X Average Risk   9.6   7.1  3 X Average Risk  23.4   11.0        Use the calculated Patient Ratio above and the CHD Risk Table to determine the patient's CHD Risk.        ATP III CLASSIFICATION (LDL):  <100     mg/dL   Optimal  100-129  mg/dL   Near or Above                    Optimal  130-159  mg/dL   Borderline  160-189  mg/dL   High  >190     mg/dL   Very High   CREATININE 1.04 04/23/2015       Scheduled Meds: . sodium chloride   Intravenous Once  . furosemide  80 mg Intravenous BID  . hydrALAZINE  10 mg Oral 3 times per day  . magnesium oxide  400 mg Oral BID  . pantoprazole (PROTONIX) IV  80 mg Intravenous Once  . [  START ON 04/26/2015] pantoprazole (PROTONIX) IV  40 mg Intravenous Q12H  .  phytonadione  10 mg Oral Once   Continuous Infusions: . pantoprozole (PROTONIX) infusion      Principal Problem:   Acute congestive heart failure (HCC) Active Problems:   Obstructive sleep apnea   HYPERTENSION, BENIGN   Anemia   Anasarca    Time spent: 46 minutes   [facility] Pager [phone]. If 7PM-7AM, please contact night-coverage at [email or web address], password [facility] 04/23/2015, 10:26 AM  LOS: 1 day

## 2015-04-23 NOTE — Evaluation (Signed)
Physical Therapy Evaluation Patient Details Name: Kyle Conrad MRN: [id number] DOB: [birth year] Today's Date: 04/23/2015   History of Present Illness  pt is a 49 y/o male with h/o CHF, NICM, HTN, admitted with worsening dyspnea on exertion and weight gain.  Clinical Impression  Pt admitted with/for acute CHF.  Pt currently limited functionally due to the problems listed below.  (see problems list.)  Pt will benefit from PT to maximize function and safety to be able to get home safely with limited available assist..     Follow Up Recommendations Home health PT;Other (comment) (pt does not wish for SNF and prefers home even without help)    Equipment Recommendations  Other (comment) (TBA)    Recommendations for Other Services       Precautions / Restrictions Precautions Precautions: Fall (minor)      Mobility  Bed Mobility Overal bed mobility: Needs Assistance Bed Mobility: Supine to Sit     Supine to sit: Min assist        Transfers Overall transfer level: Needs assistance   Transfers: Sit to/from Stand Sit to Stand: Min assist            Ambulation/Gait Ambulation/Gait assistance: Min guard Ambulation Distance (Feet): 200 Feet Assistive device: None Gait Pattern/deviations: Step-through pattern;Decreased step length - right;Decreased step length - left     General Gait Details: stiff waddling gait with wide BOS due to scrotal and groin edema.  Stairs            Wheelchair Mobility    Modified Rankin (Stroke Patients Only)       Balance Overall balance assessment: Needs assistance Sitting-balance support: No upper extremity supported;Bilateral upper extremity supported Sitting balance-[name]: Fair Sitting balance - Comments: difficulty sitting without UE due to edema                                     Pertinent Vitals/Pain Pain Assessment: Faces Faces Pain Scale: Hurts a little bit Pain Location: vague joint  soreness Pain Descriptors / Indicators: Grimacing Pain Intervention(s): Monitored during session    Home Living Family/patient expects to be discharged to:: Private residence Living Arrangements: Alone Available Help at Discharge: Friend(s);Available PRN/intermittently Type of Home: Apartment Home Access: Stairs to enter   Entrance Stairs-Number of Steps: 1 Home Layout: One level Home Equipment: None      Prior Function Level of Independence: Independent               Hand Dominance        Extremity/Trunk Assessment   Upper Extremity Assessment: Overall WFL for tasks assessed           Lower Extremity Assessment: Overall WFL for tasks assessed (but so edematous as to be stiff and weeping)         Communication   Communication: No difficulties  Cognition Arousal/Alertness: Awake/alert Behavior During Therapy: WFL for tasks assessed/performed Overall Cognitive Status: Within Functional Limits for tasks assessed                      General Comments General comments (skin integrity, edema, etc.): vss    Exercises        Assessment/Plan    PT Assessment Patient needs continued PT services  PT Diagnosis Difficulty walking (decrease activity tolerance)   PT Problem List Decreased activity tolerance;Decreased mobility;Cardiopulmonary status limiting activity;Obesity;Decreased range of motion  PT Treatment  Interventions DME instruction;Gait training;Stair training;Functional mobility training;Therapeutic activities;Patient/family education   PT Goals (Current goals can be found in the Care Plan section) Acute Rehab PT Goals Patient Stated Goal: get this weight off, independent, back to work and working out PT Goal Formulation: With patient Time For Goal Achievement: 05/07/15    Frequency Min 3X/week   Barriers to discharge Decreased caregiver support      Co-evaluation               End of Session   Activity Tolerance: Patient  tolerated treatment well Patient left: in bed;with call bell/phone within reach (EOB) Nurse Communication: Mobility status         Time: [id number] PT Time Calculation (min) (ACUTE ONLY): 34 min   Charges:   PT Evaluation $Initial PT Evaluation Tier I: 1 Procedure PT Treatments $Gait Training: 8-22 mins   PT G Codes:        Trevonn Hallum, Tessie Fass 04/23/2015, 1:46 PM 04/23/2015  Donnella Sham, PT [phone]  (pager)

## 2015-04-24 ENCOUNTER — Inpatient Hospital Stay (HOSPITAL_COMMUNITY): Payer: Self-pay

## 2015-04-24 ENCOUNTER — Ambulatory Visit (HOSPITAL_COMMUNITY): Payer: MEDICAID

## 2015-04-24 ENCOUNTER — Inpatient Hospital Stay (HOSPITAL_COMMUNITY): Payer: MEDICAID | Admitting: Anesthesiology

## 2015-04-24 DIAGNOSIS — R601 Generalized edema: Secondary | ICD-10-CM

## 2015-04-24 DIAGNOSIS — D500 Iron deficiency anemia secondary to blood loss (chronic): Secondary | ICD-10-CM

## 2015-04-24 DIAGNOSIS — I509 Heart failure, unspecified: Secondary | ICD-10-CM

## 2015-04-24 DIAGNOSIS — I5023 Acute on chronic systolic (congestive) heart failure: Secondary | ICD-10-CM | POA: Insufficient documentation

## 2015-04-24 LAB — COMPREHENSIVE METABOLIC PANEL
ALBUMIN: 2.2 g/dL — AB (ref 3.5–5.0)
ALK PHOS: 88 U/L (ref 38–126)
ALT: 19 U/L (ref 17–63)
AST: 24 U/L (ref 15–41)
Anion gap: 5 (ref 5–15)
BUN: 12 mg/dL (ref 6–20)
CALCIUM: 8.1 mg/dL — AB (ref 8.9–10.3)
CHLORIDE: 107 mmol/L (ref 101–111)
CO2: 26 mmol/L (ref 22–32)
CREATININE: 1.18 mg/dL (ref 0.61–1.24)
GFR calc non Af Amer: >60 mL/min (ref >60)
GLUCOSE: 100 mg/dL — AB (ref 65–99)
Potassium: 3.3 mmol/L — ABNORMAL LOW (ref 3.5–5.1)
SODIUM: 138 mmol/L (ref 135–145)
Total Bilirubin: 2.3 mg/dL — ABNORMAL HIGH (ref 0.3–1.2)
Total Protein: 6.3 g/dL — ABNORMAL LOW (ref 6.5–8.1)

## 2015-04-24 LAB — TYPE AND SCREEN
ABO/RH(D): B POS
ANTIBODY SCREEN: NEGATIVE
UNIT DIVISION: 0

## 2015-04-24 LAB — CBC
HCT: 26.9 % — ABNORMAL LOW (ref 39.0–52.0)
HEMOGLOBIN: 8.1 g/dL — AB (ref 13.0–17.0)
MCH: 23.1 pg — AB (ref 26.0–34.0)
MCHC: 30.1 g/dL (ref 30.0–36.0)
MCV: 76.6 fL — ABNORMAL LOW (ref 78.0–100.0)
PLATELETS: 362 10*3/uL (ref 150–400)
RBC: 3.51 MIL/uL — AB (ref 4.22–5.81)
RDW: 20.8 % — ABNORMAL HIGH (ref 11.5–15.5)
WBC: 5.1 10*3/uL (ref 4.0–10.5)

## 2015-04-24 LAB — AMMONIA: Ammonia: 33 umol/L (ref 9–35)

## 2015-04-24 MED ORDER — CARVEDILOL 3.125 MG PO TABS
3.1250 mg | ORAL_TABLET | Freq: Two times a day (BID) | ORAL | Status: DC
  Administered 2015-04-24 – 2015-04-25 (×3): 3.125 mg via ORAL
  Filled 2015-04-24 (×3): qty 1

## 2015-04-24 MED ORDER — POTASSIUM CHLORIDE CRYS ER 20 MEQ PO TBCR
40.0000 meq | EXTENDED_RELEASE_TABLET | Freq: Once | ORAL | Status: AC
  Administered 2015-04-24: 40 meq via ORAL
  Filled 2015-04-24: qty 2

## 2015-04-24 MED ORDER — ISOSORBIDE MONONITRATE ER 30 MG PO TB24
15.0000 mg | ORAL_TABLET | Freq: Every day | ORAL | Status: DC
  Administered 2015-04-24 – 2015-04-25 (×2): 15 mg via ORAL
  Filled 2015-04-24 (×2): qty 1

## 2015-04-24 MED ORDER — SODIUM CHLORIDE 0.9 % IV SOLN
INTRAVENOUS | Status: DC
  Administered 2015-04-24: 11:00:00 via INTRAVENOUS

## 2015-04-24 MED ORDER — HYDRALAZINE HCL 25 MG PO TABS
25.0000 mg | ORAL_TABLET | Freq: Two times a day (BID) | ORAL | Status: DC
  Administered 2015-04-24: 25 mg via ORAL
  Filled 2015-04-24: qty 1

## 2015-04-24 MED ORDER — PEG 3350-KCL-NA BICARB-NACL 420 G PO SOLR
4000.0000 mL | Freq: Once | ORAL | Status: AC
  Administered 2015-04-24: 4000 mL via ORAL
  Filled 2015-04-24: qty 4000

## 2015-04-24 MED ORDER — MAGNESIUM SULFATE 2 GM/50ML IV SOLN
2.0000 g | Freq: Once | INTRAVENOUS | Status: AC
  Administered 2015-04-24: 2 g via INTRAVENOUS
  Filled 2015-04-24: qty 50

## 2015-04-24 MED ORDER — VITAMIN K1 1 MG/0.5ML IJ SOLN
1.0000 mg | Freq: Once | INTRAMUSCULAR | Status: AC
  Administered 2015-04-24: 1 mg via INTRAMUSCULAR
  Filled 2015-04-24: qty 0.5

## 2015-04-24 MED ORDER — SODIUM CHLORIDE 0.9 % IV SOLN
125.0000 mg | Freq: Once | INTRAVENOUS | Status: AC
  Administered 2015-04-24: 125 mg via INTRAVENOUS
  Filled 2015-04-24: qty 10

## 2015-04-24 NOTE — Progress Notes (Addendum)
       Patient Name: Kyle Conrad Date of Encounter: 04/24/2015    SUBJECTIVE: Echocardiogram is currently being performed at bedside. Patient notes recurrent severe dyspnea last evening after receiving transfusion. IV Lasix was administered.   TELEMETRY:   normal sinus rhythm Filed Vitals:   04/23/15 2030 04/23/15 2314 04/24/15 0519 04/24/15 1116  BP: 150/83 133/98 112/41 145/92  Pulse: 108  101 101  Temp: 98.7 F (37.1 C) 98.1 F (36.7 C) 98.4 F (36.9 C) 97.9 F (36.6 C)  TempSrc: Oral Oral Oral Oral  Resp: 18 18 18 18   Height:      Weight:   278 lb 1.6 oz (126.145 kg)   SpO2: 100% 100% 100% 100%    Intake/Output Summary (Last 24 hours) at 04/24/15 1117 Last data filed at 04/24/15 [id number]  Gross per 24 hour  Intake   1610 ml  Output   3816 ml  Net  -2206 ml   LABS: Basic Metabolic Panel:  Recent Labs  04/22/15 2202 04/23/15 0355 04/24/15 0246  NA  --  140 138  K  --  4.0 3.3*  CL  --  107 107  CO2  --  25 26  GLUCOSE  --  98 100*  BUN  --  11 12  CREATININE  --  1.04 1.18  CALCIUM  --  8.3* 8.1*  MG 1.7  --   --    CBC:  Recent Labs  04/22/15 1744 04/24/15 0246  WBC 4.3 5.1  NEUTROABS 3.2  --   HGB 7.9* 8.1*  HCT 27.6* 26.9*  MCV 77.3* 76.6*  PLT 393 362    Echocardiogram:  Official interpretation not available Quick look at bedside reveals severe biventricular failure Official report later today  Radiology/Studies:   no new data  Physical Exam: Blood pressure 145/92, pulse 101, temperature 97.9 F (36.6 C), temperature source Oral, resp. rate 18, height 6' (1.829 m), weight 278 lb 1.6 oz (126.145 kg), SpO2 100 %. Weight change: -9 lb 13.1 oz (-4.455 kg)  Wt Readings from Last 3 Encounters:  04/24/15 278 lb 1.6 oz (126.145 kg)  03/03/09 259 lb (117.482 kg)  01/26/09 263 lb (119.296 kg)   Unable to examine as echocardiogram is currently being performed  ASSESSMENT:  1. Acute on chronic systolic heart failure, biventricular with  right greater than left presentation. 6 L negative since admission. 2. Severe anemia, for which transfusion is being performed 3. Hypertension, essential, with poor control 4. 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 Rule out other. He denies significant alcohol intake.  Plan:  1. Continue aggressive IV diuresis before proceeding. 2. Cleared for endoscopic procedures but probably needs one more day of diuresis  3. Achieve hemoglobin greater than 9 4. Institute heart failure therapy. Allergic to ACE/ARB therapy. Hydralazine and long-acting nitrates are initiated today.  Demetrios Isaacs 04/24/2015, 11:17 AM

## 2015-04-24 NOTE — Progress Notes (Addendum)
Kyle Conrad 10:40 AM  Subjective: Patient without any new complaints and we discussed his CT scan and his case was discussed with the hospital team and will plan to proceed with GI workup prior to anticoagulation  Objective: Vital signs stable afebrile no acute distress abdomen is soft nontender lungs are clear heart regular rate and rhythm hemoglobin stable CT reviewed liver reported as normal which implies ascites secondary to probable heart failure  Assessment: Anemia iron deficiency ascites  Plan: Will proceed with colonoscopy and endoscopy tomorrow and pending those findings and cardiology's feelings might need paracentesis at some point with fluid analysis for albumin cell count and differential cytology and amylase and possibly drawing all of liver serologies for chronic liver disease to include hepatitis B and C autoimmune markers including anti-smooth muscle antibodies ceruloplasmin and alpha-1 antitrypsin since ferritin was already low which rules out hemachromatosis and we'll recheck PT tomorrow and give vitamin K today  [facility]  Pager [phone] After 5PM or if no answer call [phone]

## 2015-04-24 NOTE — Progress Notes (Signed)
[facility] PROGRESS NOTE  Kyle Conrad [id number] DOB: 1965/09/27 DOA: 04/22/2015 PCP: No PCP Per Patient  Length of stay: 2   Assessment/Plan: Principal Problem:   Acute congestive heart failure ([place]) Active Problems:   Obstructive sleep apnea   HYPERTENSION, BENIGN   Anemia   Anasarca    1. Acute CHF:  Last 2-D echo in 10/ 2010, showed EF of 60%. Repeat 2-D echo pending. sytolic versus diastolic ?  at this time, but presumed systolic again. Has history NICM, 2010, resolved and not on meds and without follow up since then.  -Echocardiogram still pending , -Appreciate cardiology input, continue diuresis CT chest shows right-sided pleural effusion, CT abdomen shows ascites and anasarca Patient appears to have right-sided heart failure, further recommendations from cardiology pending Continue Lasix 40 mg IV every 12 Replete potassium   2. HTN:  Hypertensive at admission. Patient had angioedema with lisinopril.  Now on IV Lasix,PO hydralazine, cardiology did titrate medications based on 2-D echo   3. Microcytic Anemia and positive fecal occult blood:  Without gross bleeding or melena., But patient endorses dark stools for the last 2-3 weeks Intermittent NSAID use+. Presented with a hemoglobin of 7.9> 8.1 after one unit of blood, INR 1.86, status post vitamin K Continue patient on Protonix , currently no active bleeding, discussed with Dr. Watt Climes, patient to be scheduled for EGD and colonoscopy tomorrow Serum iron 14,  received ferrous gluconate 12/9  4. Hypoalbuminemia:  Patient anasarcic, but urine dipstick negative. Creatinine normal. -Urine protein/creatinine ratio, UA negative for protein Concern for amyloidosis Will order SPEP UPEP  5. Right upper extremity swelling Obtain venous and arterial Doppler to rule out DVT and arterial thrombus  6. Coagulopathy, does patient have underlying cirrhosis, not sure, CT of the abdomen and pelvis  shows ascites, check ammonia level as patient very somnolent  . Patient many paracentesis      DVT prophylaxsis scd's   Code Status:      Code Status Orders        Start     Ordered   04/22/15 2130  Full code   Continuous     04/22/15 2129      Family Communication: Discussed in detail with the patient, all imaging results, lab results explained to the patient   Disposition Plan:  3-4 days   Brief narrative:  49 y.o. male with a past medical history significant for HTN and NICM with episode of acute CHF in 2010 who presents with several months worsening dyspnea on exertion and weight gain.  The patient had an episode of congestive heart failure around 2010, had clean coronaries on cath with an EF of 20%, was treated heart failure clinic it appears for about one year, his EF returned around 60%, and then was lost to follow-up.  The patient reports to me that he was "all better" since then and so he didn't follow up with heart failure clinic or find a PCP. He was working as an Optometrist, and physically active (biking 30 miles twice a week and going to the gym). Around October of this year he lost his job, and it was in the context that he started to notice worsening shortness of breath.  Since that time, the patient has had progressive shortness of breath with exertion, increased swelling first in the ankles and legs, belly, arms, and scrotum, return of orthopnea, paroxysmal nocturnal dyspnea (he describes this as "feeling like my chest gets heavy when I  lay down, and then it all filters out when I sit back up"). In the last week he has been having apple cider vinegar as a diuretic, but his symptoms progressed so he came to the ER.  In the ED, the patient was tachycardic, had a BNP 2601 pg per male, and new cardiomegaly on chest x-ray. His ECG showed sinus tachycardia. Of note the patient's hemoglobin was 7.9 g/dL and he was fecal occult blood positive. He denied hematemesis, melena,  or frank bleeding from the rectum. He notes taking 1-2 aspirin or naproxen daily for a while given knee pain with his excess water weight.  Consultants:  GI  Cardiology   Procedures:  None   Antibiotics: Anti-infectives    None         HPI/Subjective: Appears to be very somnolent and difficult to arouse, sob , no chest pain   Objective: Filed Vitals:   04/23/15 2007 04/23/15 2030 04/23/15 2314 04/24/15 0519  BP: 133/96 150/83 133/98 112/41  Pulse: 113 108  101  Temp: 98.1 F (36.7 C) 98.7 F (37.1 C) 98.1 F (36.7 C) 98.4 F (36.9 C)  TempSrc: Oral Oral Oral Oral  Resp: 16 18 18 18   Height:      Weight:    126.145 kg (278 lb 1.6 oz)  SpO2: 100% 100% 100% 100%    Intake/Output Summary (Last 24 hours) at 04/24/15 1053 Last data filed at 04/24/15 [id number]  Gross per 24 hour  Intake   1610 ml  Output   4416 ml  Net  -2806 ml    Exam:  General: No acute respiratory distress Lungs: Clear to auscultation bilaterally without wheezes or crackles Cardiovascular: Regular rate and rhythm without murmur gallop or rub normal S1 and S2 Abdomen: Nontender, nondistended, soft, bowel sounds positive, no rebound, no ascites, no appreciable mass Extremities: No significant cyanosis, clubbing, or edema bilateral lower extremities. Swelling right UE      Data Review   Micro Results No results found for this or any previous visit (from the past 240 hour(s)).  Radiology Reports Dg Chest 2 View  04/22/2015  CLINICAL DATA:  Two week history of cough, congestion, weakness and upper extremity swelling. EXAM: CHEST  2 VIEW COMPARISON:  05/21/2009 FINDINGS: The heart is enlarged and somewhat accentuated by the AP projection. Given the shape of the heart could not exclude a pericardial effusion. There is mild tortuosity of the thoracic aorta. The lungs are clear. No pleural effusion. The bony thorax is grossly intact. IMPRESSION: Cardiac enlargement which represents a change since the  prior study. Could not exclude a pericardial effusion. No acute pulmonary findings. Electronically Signed   By: Marijo Sanes M.D.   On: 04/22/2015 18:00   Ct Chest W Contrast  04/23/2015  CLINICAL DATA:  Acute shortness of breath and weight gain EXAM: CT CHEST, ABDOMEN, AND PELVIS WITH CONTRAST TECHNIQUE: Multidetector CT imaging of the chest, abdomen and pelvis was performed following the standard protocol during bolus administration of intravenous contrast. CONTRAST:  155mL OMNIPAQUE IOHEXOL 300 MG/ML  SOLN COMPARISON:  None. FINDINGS: CT CHEST FINDINGS Mediastinum/Nodes: Thoracic aorta is within normal limits. Pulmonary artery as visualized shows no focal abnormality. Cardiac shadow is enlarged. No pericardial effusion is seen. No significant hilar or mediastinal adenopathy is noted. Lungs/Pleura: Moderate right-sided pleural effusion is noted. Patchy atelectatic changes are seen. No focal confluent infiltrate is noted. Musculoskeletal: No acute bony abnormality is noted. General anasarca in the soft tissues of the chest is noted.  CT ABDOMEN PELVIS FINDINGS Hepatobiliary: The liver and gallbladder appear within normal limits. Pancreas: No acute abnormality noted. Spleen: No acute abnormality noted. Adrenals/Urinary Tract: Scattered cystic changes are noted within the left kidney. The adrenal glands appear within normal limits. Bladder is well distended. Stomach/Bowel: Fecal material is noted throughout the colon. Diverticular change is seen without definitive diverticulitis. No free air is noted. The appendix is not well appreciated Vascular/Lymphatic: Vascular structures appear within normal limits. Other: Mild ascites is identified. Generalized changes of anasarca are noted in the abdominal wall and pelvic soft tissues. There is a left inguinal hernia containing ascites. Musculoskeletal: No acute bony abnormality is noted. IMPRESSION: Changes of ascites and anasarca as well as a right-sided pleural  effusion. These changes may be related to underlying hepatic failure. Correlation with laboratory values is recommended. Mild atelectatic changes are noted bilaterally. Cystic changes in the left kidney Electronically Signed   By: Inez Catalina M.D.   On: 04/23/2015 19:43   Ct Abdomen Pelvis W Contrast  04/23/2015  CLINICAL DATA:  Acute shortness of breath and weight gain EXAM: CT CHEST, ABDOMEN, AND PELVIS WITH CONTRAST TECHNIQUE: Multidetector CT imaging of the chest, abdomen and pelvis was performed following the standard protocol during bolus administration of intravenous contrast. CONTRAST:  164mL OMNIPAQUE IOHEXOL 300 MG/ML  SOLN COMPARISON:  None. FINDINGS: CT CHEST FINDINGS Mediastinum/Nodes: Thoracic aorta is within normal limits. Pulmonary artery as visualized shows no focal abnormality. Cardiac shadow is enlarged. No pericardial effusion is seen. No significant hilar or mediastinal adenopathy is noted. Lungs/Pleura: Moderate right-sided pleural effusion is noted. Patchy atelectatic changes are seen. No focal confluent infiltrate is noted. Musculoskeletal: No acute bony abnormality is noted. General anasarca in the soft tissues of the chest is noted. CT ABDOMEN PELVIS FINDINGS Hepatobiliary: The liver and gallbladder appear within normal limits. Pancreas: No acute abnormality noted. Spleen: No acute abnormality noted. Adrenals/Urinary Tract: Scattered cystic changes are noted within the left kidney. The adrenal glands appear within normal limits. Bladder is well distended. Stomach/Bowel: Fecal material is noted throughout the colon. Diverticular change is seen without definitive diverticulitis. No free air is noted. The appendix is not well appreciated Vascular/Lymphatic: Vascular structures appear within normal limits. Other: Mild ascites is identified. Generalized changes of anasarca are noted in the abdominal wall and pelvic soft tissues. There is a left inguinal hernia containing ascites.  Musculoskeletal: No acute bony abnormality is noted. IMPRESSION: Changes of ascites and anasarca as well as a right-sided pleural effusion. These changes may be related to underlying hepatic failure. Correlation with laboratory values is recommended. Mild atelectatic changes are noted bilaterally. Cystic changes in the left kidney Electronically Signed   By: Inez Catalina M.D.   On: 04/23/2015 19:43     CBC  Recent Labs Lab 04/22/15 1744 04/24/15 0246  WBC 4.3 5.1  HGB 7.9* 8.1*  HCT 27.6* 26.9*  PLT 393 362  MCV 77.3* 76.6*  MCH 22.1* 23.1*  MCHC 28.6* 30.1  RDW 21.7* 20.8*  LYMPHSABS 0.5*  --   MONOABS 0.6  --   EOSABS 0.0  --   BASOSABS 0.0  --     Chemistries   Recent Labs Lab 04/22/15 1744 04/22/15 1934 04/22/15 2202 04/23/15 0355 04/24/15 0246  NA 139  --   --  140 138  K 4.8  --   --  4.0 3.3*  CL 111  --   --  107 107  CO2 23  --   --  25 26  GLUCOSE 124*  --   --  98 100*  BUN 14  --   --  11 12  CREATININE 0.98  --   --  1.04 1.18  CALCIUM 8.4*  --   --  8.3* 8.1*  MG  --   --  1.7  --   --   AST  --  31  --   --  24  ALT  --  22  --   --  19  ALKPHOS  --  95  --   --  88  BILITOT  --  1.7*  --   --  2.3*   ------------------------------------------------------------------------------------------------------------------ estimated creatinine clearance is 101.6 mL/min (by C-G formula based on Cr of 1.18). ------------------------------------------------------------------------------------------------------------------ No results for input(s): HGBA1C in the last 72 hours. ------------------------------------------------------------------------------------------------------------------ No results for input(s): CHOL, HDL, LDLCALC, TRIG, CHOLHDL, LDLDIRECT in the last 72 hours. ------------------------------------------------------------------------------------------------------------------ No results for input(s): TSH, T4TOTAL, T3FREE, THYROIDAB in the last 72  hours.  Invalid input(s): FREET3 ------------------------------------------------------------------------------------------------------------------  Recent Labs  04/23/15 0355 04/23/15 1016  VITAMINB12 1019* 986*  FOLATE 15.7 15.1  FERRITIN 20* 21*  TIBC 332 337  IRON 14* 17*  RETICCTPCT 1.7 1.7    Coagulation profile  Recent Labs Lab 04/23/15 0355  INR 1.86*    No results for input(s): DDIMER in the last 72 hours.  Cardiac Enzymes No results for input(s): CKMB, TROPONINI, MYOGLOBIN in the last 168 hours.  Invalid input(s): CK ------------------------------------------------------------------------------------------------------------------ Invalid input(s): POCBNP   CBG: No results for input(s): GLUCAP in the last 168 hours.     Studies: Dg Chest 2 View  04/22/2015  CLINICAL DATA:  Two week history of cough, congestion, weakness and upper extremity swelling. EXAM: CHEST  2 VIEW COMPARISON:  05/21/2009 FINDINGS: The heart is enlarged and somewhat accentuated by the AP projection. Given the shape of the heart could not exclude a pericardial effusion. There is mild tortuosity of the thoracic aorta. The lungs are clear. No pleural effusion. The bony thorax is grossly intact. IMPRESSION: Cardiac enlargement which represents a change since the prior study. Could not exclude a pericardial effusion. No acute pulmonary findings. Electronically Signed   By: Marijo Sanes M.D.   On: 04/22/2015 18:00   Ct Chest W Contrast  04/23/2015  CLINICAL DATA:  Acute shortness of breath and weight gain EXAM: CT CHEST, ABDOMEN, AND PELVIS WITH CONTRAST TECHNIQUE: Multidetector CT imaging of the chest, abdomen and pelvis was performed following the standard protocol during bolus administration of intravenous contrast. CONTRAST:  111mL OMNIPAQUE IOHEXOL 300 MG/ML  SOLN COMPARISON:  None. FINDINGS: CT CHEST FINDINGS Mediastinum/Nodes: Thoracic aorta is within normal limits. Pulmonary artery as  visualized shows no focal abnormality. Cardiac shadow is enlarged. No pericardial effusion is seen. No significant hilar or mediastinal adenopathy is noted. Lungs/Pleura: Moderate right-sided pleural effusion is noted. Patchy atelectatic changes are seen. No focal confluent infiltrate is noted. Musculoskeletal: No acute bony abnormality is noted. General anasarca in the soft tissues of the chest is noted. CT ABDOMEN PELVIS FINDINGS Hepatobiliary: The liver and gallbladder appear within normal limits. Pancreas: No acute abnormality noted. Spleen: No acute abnormality noted. Adrenals/Urinary Tract: Scattered cystic changes are noted within the left kidney. The adrenal glands appear within normal limits. Bladder is well distended. Stomach/Bowel: Fecal material is noted throughout the colon. Diverticular change is seen without definitive diverticulitis. No free air is noted. The appendix is not well appreciated Vascular/Lymphatic: Vascular structures appear within normal limits. Other: Mild ascites is identified.  Generalized changes of anasarca are noted in the abdominal wall and pelvic soft tissues. There is a left inguinal hernia containing ascites. Musculoskeletal: No acute bony abnormality is noted. IMPRESSION: Changes of ascites and anasarca as well as a right-sided pleural effusion. These changes may be related to underlying hepatic failure. Correlation with laboratory values is recommended. Mild atelectatic changes are noted bilaterally. Cystic changes in the left kidney Electronically Signed   By: Inez Catalina M.D.   On: 04/23/2015 19:43   Ct Abdomen Pelvis W Contrast  04/23/2015  CLINICAL DATA:  Acute shortness of breath and weight gain EXAM: CT CHEST, ABDOMEN, AND PELVIS WITH CONTRAST TECHNIQUE: Multidetector CT imaging of the chest, abdomen and pelvis was performed following the standard protocol during bolus administration of intravenous contrast. CONTRAST:  153mL OMNIPAQUE IOHEXOL 300 MG/ML  SOLN  COMPARISON:  None. FINDINGS: CT CHEST FINDINGS Mediastinum/Nodes: Thoracic aorta is within normal limits. Pulmonary artery as visualized shows no focal abnormality. Cardiac shadow is enlarged. No pericardial effusion is seen. No significant hilar or mediastinal adenopathy is noted. Lungs/Pleura: Moderate right-sided pleural effusion is noted. Patchy atelectatic changes are seen. No focal confluent infiltrate is noted. Musculoskeletal: No acute bony abnormality is noted. General anasarca in the soft tissues of the chest is noted. CT ABDOMEN PELVIS FINDINGS Hepatobiliary: The liver and gallbladder appear within normal limits. Pancreas: No acute abnormality noted. Spleen: No acute abnormality noted. Adrenals/Urinary Tract: Scattered cystic changes are noted within the left kidney. The adrenal glands appear within normal limits. Bladder is well distended. Stomach/Bowel: Fecal material is noted throughout the colon. Diverticular change is seen without definitive diverticulitis. No free air is noted. The appendix is not well appreciated Vascular/Lymphatic: Vascular structures appear within normal limits. Other: Mild ascites is identified. Generalized changes of anasarca are noted in the abdominal wall and pelvic soft tissues. There is a left inguinal hernia containing ascites. Musculoskeletal: No acute bony abnormality is noted. IMPRESSION: Changes of ascites and anasarca as well as a right-sided pleural effusion. These changes may be related to underlying hepatic failure. Correlation with laboratory values is recommended. Mild atelectatic changes are noted bilaterally. Cystic changes in the left kidney Electronically Signed   By: Inez Catalina M.D.   On: 04/23/2015 19:43      Lab Results  Component Value Date   HGBA1C  09/10/2008    6.0 (NOTE) The ADA recommends the following therapeutic goal for glycemic control related to Hgb A1c measurement: Goal of therapy: <6.5 Hgb A1c  Reference: American Diabetes  Association: Clinical Practice Recommendations 2010, Diabetes Care, 2010, 33: (Suppl  1).   Lab Results  Component Value Date   [facility]  09/08/2008    96        Total Cholesterol/HDL:CHD Risk Coronary Heart Disease Risk Table                     Men   Women  1/2 Average Risk   3.4   3.3  Average Risk       5.0   4.4  2 X Average Risk   9.6   7.1  3 X Average Risk  23.4   11.0        Use the calculated Patient Ratio above and the CHD Risk Table to determine the patient's CHD Risk.        ATP III CLASSIFICATION (LDL):  <100     mg/dL   Optimal  100-129  mg/dL   Near or Above  Optimal  130-159  mg/dL   Borderline  160-189  mg/dL   High  >190     mg/dL   Very High   CREATININE 1.18 04/24/2015       Scheduled Meds: . furosemide  40 mg Intravenous Q12H  . hydrALAZINE  10 mg Oral 3 times per day  . magnesium oxide  400 mg Oral BID  . magnesium sulfate 1 - 4 g bolus IVPB  2 g Intravenous Once  . [START ON 04/26/2015] pantoprazole (PROTONIX) IV  40 mg Intravenous Q12H  . phytonadione  1 mg Intramuscular Once  . polyethylene glycol-electrolytes  4,000 mL Oral Once  . potassium chloride  40 mEq Oral Once   Continuous Infusions: . sodium chloride    . pantoprozole (PROTONIX) infusion 8 mg/hr (04/24/15 0125)    Principal Problem:   Acute congestive heart failure ([place]) Active Problems:   Obstructive sleep apnea   HYPERTENSION, BENIGN   Anemia   Anasarca    Time spent: 44 minutes   [facility] Pager [phone]. If 7PM-7AM, please contact night-coverage at [email or web address], password [facility] 04/24/2015, 10:53 AM  LOS: 2 days

## 2015-04-24 NOTE — Progress Notes (Signed)
Physical Therapy Treatment Patient Details Name: Chioke Jorde MRN: [id number] DOB: 1965-09-20 Today's Date: 04/24/2015    History of Present Illness pt is a 49 y/o male with h/o CHF, NICM, HTN, admitted with worsening dyspnea on exertion and weight gain.    PT Comments    Progressing slowly.   Edematous trunk and LE's hindering function, edematous UE's hindering ADL's   Follow Up Recommendations  Other (comment);SNF (weight not coming off fast enough and no family assist)     Equipment Recommendations  Other (comment)    Recommendations for Other Services       Precautions / Restrictions Precautions Precautions: Fall    Mobility  Bed Mobility Overal bed mobility: Needs Assistance Bed Mobility: Supine to Sit     Supine to sit: Mod assist        Transfers Overall transfer level: Needs assistance Equipment used: Rolling walker (2 wheeled) Transfers: Sit to/from Stand Sit to Stand: Min assist (mod for lower toilet)         General transfer comment: cues for hand placement  Ambulation/Gait Ambulation/Gait assistance: Min guard Ambulation Distance (Feet): 250 Feet Assistive device: Rolling walker (2 wheeled) Gait Pattern/deviations: Step-through pattern Gait velocity: slower   General Gait Details: stiff waddling gait with wide BOS due to scrotal and groin edema.   Stairs            Wheelchair Mobility    Modified Rankin (Stroke Patients Only)       Balance Overall balance assessment: Needs assistance   Sitting balance-[name]: Fair Sitting balance - Comments: difficulty sitting without UE due to edema   Standing balance support: No upper extremity supported Standing balance-[name]: Fair                      Cognition Arousal/Alertness: Awake/alert Behavior During Therapy: WFL for tasks assessed/performed Overall Cognitive Status: Within Functional Limits for tasks assessed                      Exercises       General Comments        Pertinent Vitals/Pain Faces Pain Scale: Hurts a little bit Pain Location: vague Pain Descriptors / Indicators: Grimacing Pain Intervention(s): Monitored during session    Home Living                      Prior Function            PT Goals (current goals can now be found in the care plan section) Acute Rehab PT Goals Patient Stated Goal: get this weight off, independent, back to work and working out PT Goal Formulation: With patient Time For Goal Achievement: 05/07/15 Progress towards PT goals: Progressing toward goals    Frequency  Min 3X/week    PT Plan Discharge plan needs to be updated    Co-evaluation             End of Session   Activity Tolerance: Patient tolerated treatment well Patient left: in bed;with call bell/phone within reach     Time: 1625-1659 PT Time Calculation (min) (ACUTE ONLY): 34 min  Charges:  $Gait Training: 8-22 mins $Therapeutic Activity: 8-22 mins                    G Codes:      Daviyon Widmayer, Tessie Fass 04/24/2015, 5:10 PM  04/24/2015  Donnella Sham, PT [phone]  (pager)

## 2015-04-24 NOTE — Progress Notes (Signed)
UR Completed. Freddi Schrager, RN, BSN.  [phone] 

## 2015-04-24 NOTE — Care Management Note (Signed)
Case Management Note  Patient Details  Name: Kyle Conrad MRN: [id number] Date of Birth: 09/12/1965  Subjective/Objective:      Pt admitted with acute CHF              Action/Plan:  Pt is independent from home.  Per pt; diagnosis of CHF has been >3 years.  CM assessed pt for HF screen; pt stated he has a scale and will begin weight self daily, pt states he adheres to low sodium diet.  Pt stated he had to walk with a cane prior to admit, PT eval will be ordered.  Pt confirmed he does not have health insurance nor PCP, pt agreed for CM to initiate [facility] services.   Expected Discharge Date:                  Expected Discharge Plan:     In-House Referral:     Discharge planning Services  CM Consult  Post Acute Care Choice:    Choice offered to:     DME Arranged:    DME Agency:     HH Arranged:    HH Agency:     Status of Service:  In process, will continue to follow  Medicare Important Message Given:    Date Medicare IM Given:    Medicare IM give by:    Date Additional Medicare IM Given:    Additional Medicare Important Message give by:     If discussed at [place] of Stay Meetings, dates discussed:    Additional Comments: 04/24/2015 Pt is self pay and states he can not pay for HHPT nor outpt PT.  Pt states that he was walking around on his own before now and he feels safe to discharge the way he is once he can get rid of the extra fluid. CM will notifiy PT that pt can not afford PT as recommended. CM will continue to follow for needs   04/23/15 CM assessed pt.  Pt [place] appt 04/30/15 at 10:30am.  CM provided pt [facility] brochure for the sickle cell location.  Pt made aware that he will be able to fill scripts at clinic immediately post discharge and to apply for medication assistance during intial appt. Maryclare Labrador, RN 04/24/2015, 3:29 PM

## 2015-04-24 NOTE — Anesthesia Preprocedure Evaluation (Deleted)
Anesthesia Evaluation  Patient identified by MRN, date of birth, ID band Patient awake    Reviewed: Allergy & Precautions, NPO status , Patient's Chart, lab work & pertinent test results  Airway Mallampati: II  TM Distance: >3 FB Neck ROM: Full    Dental no notable dental hx.    Pulmonary sleep apnea ,    Pulmonary exam normal breath sounds clear to auscultation       Cardiovascular hypertension, Pt. on medications +CHF  Normal cardiovascular exam Rhythm:Regular Rate:Normal  Left ventricle: The cavity size was severely dilated. There was moderate concentric hypertrophy. Systolic function was severely reduced. The estimated ejection fraction was in the range of 10 to 15%. Diffuse hypokinesis. Doppler parameters are consistent with restrictive physiology, indicative of decreased left ventricular diastolic compliance and/or increased left atrial pressure. Doppler parameters are consistent with elevated ventricular end-diastolic filling pressure.    Neuro/Psych negative neurological ROS  negative psych ROS   GI/Hepatic negative GI ROS, Neg liver ROS,   Endo/Other  Morbid obesity  Renal/GU negative Renal ROS  negative genitourinary   Musculoskeletal negative musculoskeletal ROS (+)   Abdominal   Peds negative pediatric ROS (+)  Hematology negative hematology ROS (+)   Anesthesia Other Findings   Reproductive/Obstetrics negative OB ROS                            Anesthesia Physical Anesthesia Plan  ASA: IV  Anesthesia Plan: MAC   Post-op Pain Management:    Induction: Intravenous  Airway Management Planned: Nasal Cannula  Additional Equipment:   Intra-op Plan:   Post-operative Plan:   Informed Consent: I have reviewed the patients History and Physical, chart, labs and discussed the procedure including the risks, benefits and alternatives for the proposed  anesthesia with the patient or authorized representative who has indicated his/her understanding and acceptance.   Dental advisory given  Plan Discussed with: CRNA and Surgeon  Anesthesia Plan Comments:        Anesthesia Quick Evaluation

## 2015-04-24 NOTE — Progress Notes (Signed)
  Echocardiogram 2D Echocardiogram has been performed.  Kyle Conrad M 04/24/2015, 11:51 AM

## 2015-04-25 ENCOUNTER — Inpatient Hospital Stay (HOSPITAL_COMMUNITY): Payer: Self-pay

## 2015-04-25 ENCOUNTER — Encounter (HOSPITAL_COMMUNITY)
Admission: EM | Disposition: A | Discharge status: Home / Self Care | Payer: Self-pay | Source: Home / Self Care | Attending: Internal Medicine

## 2015-04-25 ENCOUNTER — Inpatient Hospital Stay: Admit: 2015-04-25 | Payer: Self-pay | Admitting: Gastroenterology

## 2015-04-25 DIAGNOSIS — I429 Cardiomyopathy, unspecified: Secondary | ICD-10-CM

## 2015-04-25 DIAGNOSIS — I5041 Acute combined systolic (congestive) and diastolic (congestive) heart failure: Secondary | ICD-10-CM

## 2015-04-25 DIAGNOSIS — D509 Iron deficiency anemia, unspecified: Secondary | ICD-10-CM

## 2015-04-25 DIAGNOSIS — I10 Essential (primary) hypertension: Secondary | ICD-10-CM

## 2015-04-25 DIAGNOSIS — M7989 Other specified soft tissue disorders: Secondary | ICD-10-CM

## 2015-04-25 DIAGNOSIS — G4733 Obstructive sleep apnea (adult) (pediatric): Secondary | ICD-10-CM

## 2015-04-25 LAB — PROTIME-INR
INR: 1.84 — AB (ref 0.00–1.49)
PROTHROMBIN TIME: 21.2 s — AB (ref 11.6–15.2)

## 2015-04-25 LAB — COMPREHENSIVE METABOLIC PANEL
ALBUMIN: 2.1 g/dL — AB (ref 3.5–5.0)
ALT: 17 U/L (ref 17–63)
ANION GAP: 6 (ref 5–15)
AST: 21 U/L (ref 15–41)
Alkaline Phosphatase: 83 U/L (ref 38–126)
BUN: 10 mg/dL (ref 6–20)
CHLORIDE: 106 mmol/L (ref 101–111)
CO2: 27 mmol/L (ref 22–32)
Calcium: 8.4 mg/dL — ABNORMAL LOW (ref 8.9–10.3)
Creatinine, Ser: 1.12 mg/dL (ref 0.61–1.24)
GFR calc Af Amer: >60 mL/min (ref >60)
GFR calc non Af Amer: >60 mL/min (ref >60)
GLUCOSE: 89 mg/dL (ref 65–99)
POTASSIUM: 3.8 mmol/L (ref 3.5–5.1)
SODIUM: 139 mmol/L (ref 135–145)
Total Bilirubin: 2 mg/dL — ABNORMAL HIGH (ref 0.3–1.2)
Total Protein: 6.3 g/dL — ABNORMAL LOW (ref 6.5–8.1)

## 2015-04-25 LAB — CBC
HCT: 26.9 % — ABNORMAL LOW (ref 39.0–52.0)
Hemoglobin: 8.1 g/dL — ABNORMAL LOW (ref 13.0–17.0)
MCH: 23.3 pg — AB (ref 26.0–34.0)
MCHC: 30.1 g/dL (ref 30.0–36.0)
MCV: 77.3 fL — ABNORMAL LOW (ref 78.0–100.0)
PLATELETS: 340 10*3/uL (ref 150–400)
RBC: 3.48 MIL/uL — AB (ref 4.22–5.81)
RDW: 21 % — ABNORMAL HIGH (ref 11.5–15.5)
WBC: 3.5 10*3/uL — ABNORMAL LOW (ref 4.0–10.5)

## 2015-04-25 LAB — HEPATITIS PANEL, ACUTE
HCV Ab: <0.1 s/co ratio (ref 0.0–0.9)
HEP B C IGM: NEGATIVE
Hep A IgM: NEGATIVE
Hepatitis B Surface Ag: NEGATIVE

## 2015-04-25 LAB — CARBOXYHEMOGLOBIN
Carboxyhemoglobin: 1.3 % (ref 0.5–1.5)
METHEMOGLOBIN: 1 % (ref 0.0–1.5)
O2 Saturation: 54.1 %
TOTAL HEMOGLOBIN: 8.8 g/dL — AB (ref 13.5–18.0)

## 2015-04-25 LAB — MAGNESIUM: Magnesium: 1.7 mg/dL (ref 1.7–2.4)

## 2015-04-25 LAB — MRSA PCR SCREENING: MRSA BY PCR: NEGATIVE

## 2015-04-25 SURGERY — ERCP, WITH INTERVENTION IF INDICATED
Anesthesia: General

## 2015-04-25 SURGERY — CANCELLED PROCEDURE
Anesthesia: Monitor Anesthesia Care

## 2015-04-25 MED ORDER — SODIUM CHLORIDE 0.9 % IJ SOLN: 10.0000 mL | INTRAMUSCULAR | Status: DC | PRN

## 2015-04-25 MED ORDER — MILRINONE IN DEXTROSE 20 MG/100ML IV SOLN
0.2500 ug/kg/min | INTRAVENOUS | Status: DC
  Administered 2015-04-25 (×2): 0.375 ug/kg/min via INTRAVENOUS
  Administered 2015-04-26: 0.25 ug/kg/min via INTRAVENOUS
  Administered 2015-04-26: 0.375 ug/kg/min via INTRAVENOUS
  Administered 2015-04-26 – 2015-04-28 (×4): 0.25 ug/kg/min via INTRAVENOUS
  Filled 2015-04-25 (×8): qty 100

## 2015-04-25 MED ORDER — SODIUM CHLORIDE 0.9 % IJ SOLN
10.0000 mL | Freq: Two times a day (BID) | INTRAMUSCULAR | Status: DC
  Administered 2015-04-25 – 2015-04-28 (×5): 10 mL
  Administered 2015-04-28: 20 mL
  Administered 2015-04-29 – 2015-05-01 (×4): 10 mL

## 2015-04-25 MED ORDER — HYDRALAZINE HCL 10 MG PO TABS
20.0000 mg | ORAL_TABLET | Freq: Three times a day (TID) | ORAL | Status: DC
  Administered 2015-04-25 – 2015-04-26 (×3): 20 mg via ORAL
  Filled 2015-04-25 (×3): qty 2

## 2015-04-25 NOTE — Progress Notes (Signed)
Pharmacist Heart Failure Core Measure Documentation  Assessment: Kyle Conrad has an EF documented as 20-25% on [id number] by Roxanne Mins.  Rationale: Heart failure patients with left ventricular systolic dysfunction (LVSD) and an EF < 40% should be prescribed an angiotensin converting enzyme inhibitor (ACEI) or angiotensin receptor blocker (ARB) at discharge unless a contraindication is documented in the medical record.  This patient is not currently on an ACEI or ARB for HF.  This note is being placed in the record in order to provide documentation that a contraindication to the use of these agents is present for this encounter.  ACE Inhibitor or Angiotensin Receptor Blocker is contraindicated (specify all that apply)  []   ACEI allergy AND ARB allergy [x]   Angioedema []   Moderate or severe aortic stenosis []   Hyperkalemia []   Hypotension []   Renal artery stenosis []   Worsening renal function, preexisting renal disease or dysfunction   Elicia Lamp, PharmD, [facility] Clinical Pharmacist Pager [phone] 04/25/2015 1:47 PM

## 2015-04-25 NOTE — Progress Notes (Addendum)
    Patient transferred to [facility] stepdown. Co-ox noted to be 54. Will initiate milrinone infusion. Stopping Coreg. Dr. Aundra Dubin consulted for CHF team management.  Satira Sark, M.D., F.A.C.C.

## 2015-04-25 NOTE — Progress Notes (Signed)
Report called to [facility] RN. Patient transferred to [place]. Met RN at bedside for patient handoff.   Fritz Pickerel, RN

## 2015-04-25 NOTE — Progress Notes (Signed)
Peripherally Inserted Central Catheter/Midline Placement  The IV Nurse has discussed with the patient and/or persons authorized to consent for the patient, the purpose of this procedure and the potential benefits and risks involved with this procedure.  The benefits include less needle sticks, lab draws from the catheter and patient may be discharged home with the catheter.  Risks include, but not limited to, infection, bleeding, blood clot (thrombus formation), and puncture of an artery; nerve damage and irregular heat beat.  Alternatives to this procedure were also discussed.  PICC/Midline Placement Documentation  PICC Double Lumen [id number] PICC Right Basilic 44 cm 0 cm (Active)  Indication for Insertion or Continuance of Line Vasoactive infusions 04/25/2015 10:11 AM  Exposed Catheter (cm) 0 cm 04/25/2015 10:11 AM  Site Assessment Clean;Dry;Intact 04/25/2015 10:11 AM  Lumen #1 Status Flushed;Saline locked;Blood return noted 04/25/2015 10:11 AM  Lumen #2 Status Flushed;Saline locked;Blood return noted 04/25/2015 10:11 AM  Dressing Type Transparent 04/25/2015 10:11 AM  Dressing Intervention New dressing 04/25/2015 10:11 AM  Dressing Change Due 05/02/15 04/25/2015 10:11 AM       Gordan Payment 04/25/2015, 10:33 AM

## 2015-04-25 NOTE — Progress Notes (Addendum)
VASCULAR LAB PRELIMINARY  PRELIMINARY  PRELIMINARY  PRELIMINARY  Right upper extremity venous duplex completed.    Preliminary report:  Right:  No evidence of DVT or superficial thrombosis.  Unable to image under the PICC line bandage.  Interstitial fluid noted throughout the arm.  Shantina Chronister, RVT 04/25/2015, 12:10 PM

## 2015-04-25 NOTE — Progress Notes (Signed)
[facility] PROGRESS NOTE  Kyle Conrad [id number] DOB: [birth year] DOA: 04/22/2015 PCP: No PCP Per Patient  Length of stay: 3   Assessment/Plan: Principal Problem:   Acute congestive heart failure ([name]) Active Problems:   Obstructive sleep apnea   HYPERTENSION, BENIGN   Anemia   Anasarca   Acute on chronic systolic heart failure ([name])    1. Acute CHF:  Last 2-D echo in 10/ 2010, current LVEF 10-15% with restrictive diastolic filling pattern and moderate right ventricular dysfunction. He has moderate to severe mitral regurgitation as well likely related to annular dilatation. PASP 48 mmHg. -Appreciate cardiology input, continue diuresis PICC line to measure Co-ox level with plan to initiate low-dose milrinone if needed (cardiology. Change hydralazine to 3 times a day, continue Imdur Continue Coreg CT chest shows right-sided pleural effusion, CT abdomen shows ascites and anasarca Continue Lasix 40 mg IV every 12   2. HTN:  Hypertensive at admission. Patient had angioedema with lisinopril.  Now on IV Lasix,PO hydralazine, Coreg, cardiology did titrate medications based on 2-D echo   3. Microcytic Anemia and positive fecal occult blood:  Without gross bleeding or melena., But patient endorses dark stools for the last 2-3 weeks Intermittent NSAID use+. Presented with a hemoglobin of 7.9> 8.1 > 8.1 after one unit of blood, INR 1.86, status post vitamin K 1.84 Continue patient on Protonix , currently no active bleeding, discussed with Dr. Watt Climes, postpone EGD and colonoscopy until cleared by cardiology Serum iron 14,  received ferrous gluconate 12/9  4. Hypoalbuminemia:  Patient anasarcic, but urine dipstick negative. Creatinine normal. -Urine protein/creatinine ratio, UA negative for protein Concern for amyloidosis Check SPEP UPEP  5. Right upper extremity swelling Obtain venous and arterial Doppler to rule out DVT and arterial thrombus  6.  Coagulopathy, does patient have underlying cirrhosis, not sure, CT of the abdomen and pelvis shows ascites, check ammonia level as patient very somnolent  . Patient many paracentesis      DVT prophylaxsis scd's   Code Status:      Code Status Orders        Start     Ordered   04/22/15 2130  Full code   Continuous     04/22/15 2129      Family Communication: Discussed in detail with the patient, all imaging results, lab results explained to the patient   Disposition Plan:  3-4 days   Brief narrative:  49 y.o. male with a past medical history significant for HTN and NICM with episode of acute CHF in 2010 who presents with several months worsening dyspnea on exertion and weight gain.  The patient had an episode of congestive heart failure around 2010, had clean coronaries on cath with an EF of 20%, was treated heart failure clinic it appears for about one year, his EF returned around 60%, and then was lost to follow-up.  The patient reports to me that he was "all better" since then and so he didn't follow up with heart failure clinic or find a PCP. He was working as an Optometrist, and physically active (biking 30 miles twice a week and going to the gym). Around October of this year he lost his job, and it was in the context that he started to notice worsening shortness of breath.  Since that time, the patient has had progressive shortness of breath with exertion, increased swelling first in the ankles and legs, belly, arms, and scrotum, return of orthopnea, paroxysmal nocturnal  dyspnea (he describes this as "feeling like my chest gets heavy when I lay down, and then it all filters out when I sit back up"). In the last week he has been having apple cider vinegar as a diuretic, but his symptoms progressed so he came to the ER.  In the ED, the patient was tachycardic, had a BNP 2601 pg per male, and new cardiomegaly on chest x-ray. His ECG showed sinus tachycardia. Of note the patient's  hemoglobin was 7.9 g/dL and he was fecal occult blood positive. He denied hematemesis, melena, or frank bleeding from the rectum. He notes taking 1-2 aspirin or naproxen daily for a while given knee pain with his excess water weight.  Consultants:  GI  Cardiology   Procedures:  None   Antibiotics: Anti-infectives    None         HPI/Subjective: Less short of breath overnight. Had large loose stool this morning. Was up in chair to limited degree yesterday. No chest pain. No abdominal pain  Objective: Filed Vitals:   04/24/15 1116 04/24/15 1325 04/24/15 2029 04/25/15 0539  BP: 145/92 145/93 114/94 131/94  Pulse: 101 106 96 86  Temp: 97.9 F (36.6 C) 97.9 F (36.6 C) 97.6 F (36.4 C) 97.9 F (36.6 C)  TempSrc: Oral Oral Oral Oral  Resp: 18 20 20 17   Height:      Weight:    125.011 kg (275 lb 9.6 oz)  SpO2: 100% 99% 100% 100%    Intake/Output Summary (Last 24 hours) at 04/25/15 1043 Last data filed at 04/25/15 0557  Gross per 24 hour  Intake   1440 ml  Output   3480 ml  Net  -2040 ml    Exam:  General: No acute respiratory distress Lungs: Clear to auscultation bilaterally without wheezes or crackles Cardiovascular: Regular rate and rhythm without murmur gallop or rub normal S1 and S2 Abdomen: Nontender, nondistended, soft, bowel sounds positive, no rebound, no ascites, no appreciable mass Extremities: No significant cyanosis, clubbing, or edema bilateral lower extremities. Swelling right UE      Data Review   Micro Results No results found for this or any previous visit (from the past 240 hour(s)).  Radiology Reports Dg Chest 2 View  04/22/2015  CLINICAL DATA:  Two week history of cough, congestion, weakness and upper extremity swelling. EXAM: CHEST  2 VIEW COMPARISON:  05/21/2009 FINDINGS: The heart is enlarged and somewhat accentuated by the AP projection. Given the shape of the heart could not exclude a pericardial effusion. There is mild tortuosity  of the thoracic aorta. The lungs are clear. No pleural effusion. The bony thorax is grossly intact. IMPRESSION: Cardiac enlargement which represents a change since the prior study. Could not exclude a pericardial effusion. No acute pulmonary findings. Electronically Signed   By: Marijo Sanes M.D.   On: 04/22/2015 18:00   Ct Chest W Contrast  04/23/2015  CLINICAL DATA:  Acute shortness of breath and weight gain EXAM: CT CHEST, ABDOMEN, AND PELVIS WITH CONTRAST TECHNIQUE: Multidetector CT imaging of the chest, abdomen and pelvis was performed following the standard protocol during bolus administration of intravenous contrast. CONTRAST:  162mL OMNIPAQUE IOHEXOL 300 MG/ML  SOLN COMPARISON:  None. FINDINGS: CT CHEST FINDINGS Mediastinum/Nodes: Thoracic aorta is within normal limits. Pulmonary artery as visualized shows no focal abnormality. Cardiac shadow is enlarged. No pericardial effusion is seen. No significant hilar or mediastinal adenopathy is noted. Lungs/Pleura: Moderate right-sided pleural effusion is noted. Patchy atelectatic changes are seen. No  focal confluent infiltrate is noted. Musculoskeletal: No acute bony abnormality is noted. General anasarca in the soft tissues of the chest is noted. CT ABDOMEN PELVIS FINDINGS Hepatobiliary: The liver and gallbladder appear within normal limits. Pancreas: No acute abnormality noted. Spleen: No acute abnormality noted. Adrenals/Urinary Tract: Scattered cystic changes are noted within the left kidney. The adrenal glands appear within normal limits. Bladder is well distended. Stomach/Bowel: Fecal material is noted throughout the colon. Diverticular change is seen without definitive diverticulitis. No free air is noted. The appendix is not well appreciated Vascular/Lymphatic: Vascular structures appear within normal limits. Other: Mild ascites is identified. Generalized changes of anasarca are noted in the abdominal wall and pelvic soft tissues. There is a left  inguinal hernia containing ascites. Musculoskeletal: No acute bony abnormality is noted. IMPRESSION: Changes of ascites and anasarca as well as a right-sided pleural effusion. These changes may be related to underlying hepatic failure. Correlation with laboratory values is recommended. Mild atelectatic changes are noted bilaterally. Cystic changes in the left kidney Electronically Signed   By: Inez Catalina M.D.   On: 04/23/2015 19:43   Ct Abdomen Pelvis W Contrast  04/23/2015  CLINICAL DATA:  Acute shortness of breath and weight gain EXAM: CT CHEST, ABDOMEN, AND PELVIS WITH CONTRAST TECHNIQUE: Multidetector CT imaging of the chest, abdomen and pelvis was performed following the standard protocol during bolus administration of intravenous contrast. CONTRAST:  116mL OMNIPAQUE IOHEXOL 300 MG/ML  SOLN COMPARISON:  None. FINDINGS: CT CHEST FINDINGS Mediastinum/Nodes: Thoracic aorta is within normal limits. Pulmonary artery as visualized shows no focal abnormality. Cardiac shadow is enlarged. No pericardial effusion is seen. No significant hilar or mediastinal adenopathy is noted. Lungs/Pleura: Moderate right-sided pleural effusion is noted. Patchy atelectatic changes are seen. No focal confluent infiltrate is noted. Musculoskeletal: No acute bony abnormality is noted. General anasarca in the soft tissues of the chest is noted. CT ABDOMEN PELVIS FINDINGS Hepatobiliary: The liver and gallbladder appear within normal limits. Pancreas: No acute abnormality noted. Spleen: No acute abnormality noted. Adrenals/Urinary Tract: Scattered cystic changes are noted within the left kidney. The adrenal glands appear within normal limits. Bladder is well distended. Stomach/Bowel: Fecal material is noted throughout the colon. Diverticular change is seen without definitive diverticulitis. No free air is noted. The appendix is not well appreciated Vascular/Lymphatic: Vascular structures appear within normal limits. Other: Mild ascites  is identified. Generalized changes of anasarca are noted in the abdominal wall and pelvic soft tissues. There is a left inguinal hernia containing ascites. Musculoskeletal: No acute bony abnormality is noted. IMPRESSION: Changes of ascites and anasarca as well as a right-sided pleural effusion. These changes may be related to underlying hepatic failure. Correlation with laboratory values is recommended. Mild atelectatic changes are noted bilaterally. Cystic changes in the left kidney Electronically Signed   By: Inez Catalina M.D.   On: 04/23/2015 19:43     CBC  Recent Labs Lab 04/22/15 1744 04/24/15 0246 04/25/15 0247  WBC 4.3 5.1 3.5*  HGB 7.9* 8.1* 8.1*  HCT 27.6* 26.9* 26.9*  PLT 393 362 340  MCV 77.3* 76.6* 77.3*  MCH 22.1* 23.1* 23.3*  MCHC 28.6* 30.1 30.1  RDW 21.7* 20.8* 21.0*  LYMPHSABS 0.5*  --   --   MONOABS 0.6  --   --   EOSABS 0.0  --   --   BASOSABS 0.0  --   --     Chemistries   Recent Labs Lab 04/22/15 1744 04/22/15 1934 04/22/15 2202 04/23/15 0355 04/24/15  0246 04/25/15 0247  NA 139  --   --  140 138 139  K 4.8  --   --  4.0 3.3* 3.8  CL 111  --   --  107 107 106  CO2 23  --   --  25 26 27   GLUCOSE 124*  --   --  98 100* 89  BUN 14  --   --  11 12 10   CREATININE 0.98  --   --  1.04 1.18 1.12  CALCIUM 8.4*  --   --  8.3* 8.1* 8.4*  MG  --   --  1.7  --   --   --   AST  --  31  --   --  24 21  ALT  --  22  --   --  19 17  ALKPHOS  --  95  --   --  88 83  BILITOT  --  1.7*  --   --  2.3* 2.0*   ------------------------------------------------------------------------------------------------------------------ estimated creatinine clearance is 106.6 mL/min (by C-G formula based on Cr of 1.12). ------------------------------------------------------------------------------------------------------------------ No results for input(s): HGBA1C in the last 72  hours. ------------------------------------------------------------------------------------------------------------------ No results for input(s): CHOL, HDL, LDLCALC, TRIG, CHOLHDL, LDLDIRECT in the last 72 hours. ------------------------------------------------------------------------------------------------------------------ No results for input(s): TSH, T4TOTAL, T3FREE, THYROIDAB in the last 72 hours.  Invalid input(s): FREET3 ------------------------------------------------------------------------------------------------------------------  Recent Labs  04/23/15 0355 04/23/15 1016  VITAMINB12 1019* 986*  FOLATE 15.7 15.1  FERRITIN 20* 21*  TIBC 332 337  IRON 14* 17*  RETICCTPCT 1.7 1.7    Coagulation profile  Recent Labs Lab 04/23/15 0355 04/25/15 0247  INR 1.86* 1.84*    No results for input(s): DDIMER in the last 72 hours.  Cardiac Enzymes No results for input(s): CKMB, TROPONINI, MYOGLOBIN in the last 168 hours.  Invalid input(s): CK ------------------------------------------------------------------------------------------------------------------ Invalid input(s): POCBNP   CBG: No results for input(s): GLUCAP in the last 168 hours.     Studies: Ct Chest W Contrast  04/23/2015  CLINICAL DATA:  Acute shortness of breath and weight gain EXAM: CT CHEST, ABDOMEN, AND PELVIS WITH CONTRAST TECHNIQUE: Multidetector CT imaging of the chest, abdomen and pelvis was performed following the standard protocol during bolus administration of intravenous contrast. CONTRAST:  136mL OMNIPAQUE IOHEXOL 300 MG/ML  SOLN COMPARISON:  None. FINDINGS: CT CHEST FINDINGS Mediastinum/Nodes: Thoracic aorta is within normal limits. Pulmonary artery as visualized shows no focal abnormality. Cardiac shadow is enlarged. No pericardial effusion is seen. No significant hilar or mediastinal adenopathy is noted. Lungs/Pleura: Moderate right-sided pleural effusion is noted. Patchy atelectatic changes  are seen. No focal confluent infiltrate is noted. Musculoskeletal: No acute bony abnormality is noted. General anasarca in the soft tissues of the chest is noted. CT ABDOMEN PELVIS FINDINGS Hepatobiliary: The liver and gallbladder appear within normal limits. Pancreas: No acute abnormality noted. Spleen: No acute abnormality noted. Adrenals/Urinary Tract: Scattered cystic changes are noted within the left kidney. The adrenal glands appear within normal limits. Bladder is well distended. Stomach/Bowel: Fecal material is noted throughout the colon. Diverticular change is seen without definitive diverticulitis. No free air is noted. The appendix is not well appreciated Vascular/Lymphatic: Vascular structures appear within normal limits. Other: Mild ascites is identified. Generalized changes of anasarca are noted in the abdominal wall and pelvic soft tissues. There is a left inguinal hernia containing ascites. Musculoskeletal: No acute bony abnormality is noted. IMPRESSION: Changes of ascites and anasarca as well as a right-sided pleural effusion. These changes may  be related to underlying hepatic failure. Correlation with laboratory values is recommended. Mild atelectatic changes are noted bilaterally. Cystic changes in the left kidney Electronically Signed   By: Inez Catalina M.D.   On: 04/23/2015 19:43   Ct Abdomen Pelvis W Contrast  04/23/2015  CLINICAL DATA:  Acute shortness of breath and weight gain EXAM: CT CHEST, ABDOMEN, AND PELVIS WITH CONTRAST TECHNIQUE: Multidetector CT imaging of the chest, abdomen and pelvis was performed following the standard protocol during bolus administration of intravenous contrast. CONTRAST:  160mL OMNIPAQUE IOHEXOL 300 MG/ML  SOLN COMPARISON:  None. FINDINGS: CT CHEST FINDINGS Mediastinum/Nodes: Thoracic aorta is within normal limits. Pulmonary artery as visualized shows no focal abnormality. Cardiac shadow is enlarged. No pericardial effusion is seen. No significant hilar or  mediastinal adenopathy is noted. Lungs/Pleura: Moderate right-sided pleural effusion is noted. Patchy atelectatic changes are seen. No focal confluent infiltrate is noted. Musculoskeletal: No acute bony abnormality is noted. General anasarca in the soft tissues of the chest is noted. CT ABDOMEN PELVIS FINDINGS Hepatobiliary: The liver and gallbladder appear within normal limits. Pancreas: No acute abnormality noted. Spleen: No acute abnormality noted. Adrenals/Urinary Tract: Scattered cystic changes are noted within the left kidney. The adrenal glands appear within normal limits. Bladder is well distended. Stomach/Bowel: Fecal material is noted throughout the colon. Diverticular change is seen without definitive diverticulitis. No free air is noted. The appendix is not well appreciated Vascular/Lymphatic: Vascular structures appear within normal limits. Other: Mild ascites is identified. Generalized changes of anasarca are noted in the abdominal wall and pelvic soft tissues. There is a left inguinal hernia containing ascites. Musculoskeletal: No acute bony abnormality is noted. IMPRESSION: Changes of ascites and anasarca as well as a right-sided pleural effusion. These changes may be related to underlying hepatic failure. Correlation with laboratory values is recommended. Mild atelectatic changes are noted bilaterally. Cystic changes in the left kidney Electronically Signed   By: Inez Catalina M.D.   On: 04/23/2015 19:43      Lab Results  Component Value Date   HGBA1C  09/10/2008    6.0 (NOTE) The ADA recommends the following therapeutic goal for glycemic control related to Hgb A1c measurement: Goal of therapy: <6.5 Hgb A1c  Reference: American Diabetes Association: Clinical Practice Recommendations 2010, Diabetes Care, 2010, 33: (Suppl  1).   Lab Results  Component Value Date   [facility]  09/08/2008    96        Total Cholesterol/HDL:CHD Risk Coronary Heart Disease Risk Table                     Men    Women  1/2 Average Risk   3.4   3.3  Average Risk       5.0   4.4  2 X Average Risk   9.6   7.1  3 X Average Risk  23.4   11.0        Use the calculated Patient Ratio above and the CHD Risk Table to determine the patient's CHD Risk.        ATP III CLASSIFICATION (LDL):  <100     mg/dL   Optimal  100-129  mg/dL   Near or Above                    Optimal  130-159  mg/dL   Borderline  160-189  mg/dL   High  >190     mg/dL   Very High   CREATININE  1.12 04/25/2015       Scheduled Meds: . carvedilol  3.125 mg Oral BID WC  . furosemide  40 mg Intravenous Q12H  . hydrALAZINE  20 mg Oral 3 times per day  . isosorbide mononitrate  15 mg Oral Daily  . magnesium oxide  400 mg Oral BID  . [START ON 04/26/2015] pantoprazole (PROTONIX) IV  40 mg Intravenous Q12H  . sodium chloride  10-40 mL Intracatheter Q12H   Continuous Infusions: . sodium chloride 10 mL/hr at 04/24/15 1118  . pantoprozole (PROTONIX) infusion 8 mg/hr (04/25/15 0553)    Principal Problem:   Acute congestive heart failure (HCC) Active Problems:   Obstructive sleep apnea   HYPERTENSION, BENIGN   Anemia   Anasarca   Acute on chronic systolic heart failure ([place])    Time spent: 45 minutes   [facility] Pager [phone]. If 7PM-7AM, please contact night-coverage at [email or web address], password [facility] 04/25/2015, 10:43 AM  LOS: 3 days

## 2015-04-25 NOTE — Progress Notes (Addendum)
Primary cardiologist: Dr. Virl Axe (2010)  Seen for followup: Cardiomyopathy, CHF  Subjective:    Less short of breath overnight. Had large loose stool this morning. Was up in chair to limited degree yesterday. No chest pain. No abdominal pain.  Objective:   Temp:  [97.6 F (36.4 C)-97.9 F (36.6 C)] 97.9 F (36.6 C) (12/10 0539) Pulse Rate:  [86-106] 86 (12/10 0539) Resp:  [17-20] 17 (12/10 0539) BP: (114-145)/(92-94) 131/94 mmHg (12/10 0539) SpO2:  [99 %-100 %] 100 % (12/10 0539) Weight:  [275 lb 9.6 oz (125.011 kg)] 275 lb 9.6 oz (125.011 kg) (12/10 0539) Last BM Date: 04/24/15  Filed Weights   04/23/15 0427 04/24/15 0519 04/25/15 0539  Weight: 280 lb 10.3 oz (127.3 kg) 278 lb 1.6 oz (126.145 kg) 275 lb 9.6 oz (125.011 kg)    Intake/Output Summary (Last 24 hours) at 04/25/15 0752 Last data filed at 04/25/15 0557  Gross per 24 hour  Intake   2040 ml  Output   3730 ml  Net  -1690 ml    Telemetry: Sinus rhythm.  Exam:  General: Obese male, no distress.  HEENT: Conjunctiva and lids normal, all tracts clear.  Lungs: Decreased breath sounds on the right side mid-level down.  Cardiac: Elevated JVP. Indistinct PMI, RRR, soft S3 and apical systolic murmur.  Abdomen: Protuberant with anasarca.  Extremities: Woody edema bilaterally up to the thighs and sacrum.  Lab Results:  Basic Metabolic Panel:  Recent Labs Lab 04/22/15 2202 04/23/15 0355 04/24/15 0246 04/25/15 0247  NA  --  140 138 139  K  --  4.0 3.3* 3.8  CL  --  107 107 106  CO2  --  25 26 27   GLUCOSE  --  98 100* 89  BUN  --  11 12 10   CREATININE  --  1.04 1.18 1.12  CALCIUM  --  8.3* 8.1* 8.4*  MG 1.7  --   --   --     Liver Function Tests:  Recent Labs Lab 04/22/15 1934 04/24/15 0246 04/25/15 0247  AST 31 24 21   ALT 22 19 17   ALKPHOS 95 88 83  BILITOT 1.7* 2.3* 2.0*  PROT 7.2 6.3* 6.3*  ALBUMIN 2.5* 2.2* 2.1*    CBC:  Recent Labs Lab 04/22/15 1744 04/24/15 0246  04/25/15 0247  WBC 4.3 5.1 3.5*  HGB 7.9* 8.1* 8.1*  HCT 27.6* 26.9* 26.9*  MCV 77.3* 76.6* 77.3*  PLT 393 362 340    Coagulation:  Recent Labs Lab 04/23/15 0355 04/25/15 0247  INR 1.86* 1.84*    Echocardiogram 04/24/2015: Study Conclusions  - Left ventricle: The cavity size was severely dilated. There was moderate concentric hypertrophy. Systolic function was severely reduced. The estimated ejection fraction was in the range of 10 to 15%. Diffuse hypokinesis. Doppler parameters are consistent with restrictive physiology, indicative of decreased left ventricular diastolic compliance and/or increased left atrial pressure. Doppler parameters are consistent with elevated ventricular end-diastolic filling pressure. - Aortic valve: There was trivial regurgitation. - Mitral valve: There was moderate to severe regurgitation. - Left atrium: The atrium was moderately dilated. - Right ventricle: The cavity size was moderately dilated. Wall thickness was normal. Systolic function was moderately reduced. - Right atrium: The atrium was moderately dilated. - Tricuspid valve: There was moderate regurgitation. - Pulmonic valve: There was mild regurgitation. - Pulmonary arteries: Systolic pressure was moderately increased. PA peak pressure: 48 mm Hg (S). - Inferior vena cava: The vessel was dilated. The respirophasic  diameter changes were blunted (< 50%), consistent with elevated central venous pressure. - Pericardium, extracardiac: A mild pericardial effusion was identified posterior to the heart. Features were not consistent with tamponade physiology.  Impressions:  - Since the last exam in 2010 there have been significant changes: LV is severely dilated with severe systolic dysfunction and restrictive pattern of diastolic dysfunction. RV is moderately dilated with moderate systolic dysfunction. There is multivalvular regurgitation as a result of  ventricular dilatation. This appears as end stage of hypertensive heart disease (moderate concentric LVH despite severe dilatation). CHF consult should be considered.   Medications:   Scheduled Medications: . carvedilol  3.125 mg Oral BID WC  . furosemide  40 mg Intravenous Q12H  . hydrALAZINE  25 mg Oral Q12H  . isosorbide mononitrate  15 mg Oral Daily  . magnesium oxide  400 mg Oral BID  . [START ON 04/26/2015] pantoprazole (PROTONIX) IV  40 mg Intravenous Q12H     Infusions: . sodium chloride 10 mL/hr at 04/24/15 1118  . pantoprozole (PROTONIX) infusion 8 mg/hr (04/25/15 0553)      Assessment:   1. History of nonischemic cardiomyopathy with normal coronaries as of 2010, previous normalization of LVEF on medical therapy. Now with acute combined heart failure, current LVEF 10-15% with restrictive diastolic filling pattern and moderate right ventricular dysfunction. He has moderate to severe mitral regurgitation as well likely related to annular dilatation. PASP 48 mmHg.  2. Iron deficiency anemia with heme positive stools. Gastroenterology following with plan for endoscopy/colonoscopy eventually. Has been short of breath with PRBC transfusions.  3. History of angioedema with lisinopril.  4. Anasarca with low albumin stores.  Plan/Discussion:    I reviewed the chart and discussed the situation with the patient. I would hold off on endoscopy/colonoscopy until his cardiac status is better stabilized. I have asked for CHF team consultation to assist with management. In the meanwhile will have a PICC line placed and get a Co-ox level with plan to initiate low-dose milrinone if needed. Change hydralazine to 3 times a day, continue Imdur. I will leave him on low-dose Coreg for now unless this starts to inhibit diuresis or blood pressure.   Satira Sark, M.D., F.A.C.C.

## 2015-04-25 NOTE — Progress Notes (Signed)
Postponing endoscopy and colonoscopy per cardiology recommendations, plus patient refused to complete prep last night as well.  Will follow at a distance, call us back when needed.

## 2015-04-26 LAB — CBC
HEMATOCRIT: 26.9 % — AB (ref 39.0–52.0)
HEMOGLOBIN: 8 g/dL — AB (ref 13.0–17.0)
MCH: 23.1 pg — AB (ref 26.0–34.0)
MCHC: 29.7 g/dL — AB (ref 30.0–36.0)
MCV: 77.5 fL — ABNORMAL LOW (ref 78.0–100.0)
Platelets: 333 10*3/uL (ref 150–400)
RBC: 3.47 MIL/uL — ABNORMAL LOW (ref 4.22–5.81)
RDW: 21 % — ABNORMAL HIGH (ref 11.5–15.5)
WBC: 4 10*3/uL (ref 4.0–10.5)

## 2015-04-26 LAB — COMPREHENSIVE METABOLIC PANEL
ALBUMIN: 1.8 g/dL — AB (ref 3.5–5.0)
ALT: 14 U/L — ABNORMAL LOW (ref 17–63)
ANION GAP: 5 (ref 5–15)
AST: 22 U/L (ref 15–41)
Alkaline Phosphatase: 81 U/L (ref 38–126)
BUN: 13 mg/dL (ref 6–20)
CHLORIDE: 103 mmol/L (ref 101–111)
CO2: 30 mmol/L (ref 22–32)
Calcium: 8.1 mg/dL — ABNORMAL LOW (ref 8.9–10.3)
Creatinine, Ser: 1.16 mg/dL (ref 0.61–1.24)
GFR calc Af Amer: >60 mL/min (ref >60)
GFR calc non Af Amer: >60 mL/min (ref >60)
GLUCOSE: 111 mg/dL — AB (ref 65–99)
POTASSIUM: 3.3 mmol/L — AB (ref 3.5–5.1)
SODIUM: 138 mmol/L (ref 135–145)
TOTAL PROTEIN: 5.6 g/dL — AB (ref 6.5–8.1)
Total Bilirubin: 1.1 mg/dL (ref 0.3–1.2)

## 2015-04-26 LAB — CARBOXYHEMOGLOBIN
CARBOXYHEMOGLOBIN: 2.1 % — AB (ref 0.5–1.5)
Methemoglobin: 0.7 % (ref 0.0–1.5)
O2 SAT: 84.2 %
TOTAL HEMOGLOBIN: 7.8 g/dL — AB (ref 13.5–18.0)

## 2015-04-26 LAB — MAGNESIUM: Magnesium: 1.6 mg/dL — ABNORMAL LOW (ref 1.7–2.4)

## 2015-04-26 MED ORDER — POTASSIUM CHLORIDE CRYS ER 20 MEQ PO TBCR
40.0000 meq | EXTENDED_RELEASE_TABLET | Freq: Two times a day (BID) | ORAL | Status: DC
  Administered 2015-04-26 (×2): 40 meq via ORAL
  Filled 2015-04-26 (×2): qty 2

## 2015-04-26 MED ORDER — DIGOXIN 125 MCG PO TABS
0.1250 mg | ORAL_TABLET | Freq: Every day | ORAL | Status: DC
  Administered 2015-04-26 – 2015-05-02 (×6): 0.125 mg via ORAL
  Filled 2015-04-26 (×6): qty 1

## 2015-04-26 MED ORDER — POTASSIUM CHLORIDE CRYS ER 20 MEQ PO TBCR
40.0000 meq | EXTENDED_RELEASE_TABLET | Freq: Once | ORAL | Status: AC
  Administered 2015-04-26: 40 meq via ORAL
  Filled 2015-04-26: qty 2

## 2015-04-26 MED ORDER — ISOSORBIDE MONONITRATE ER 30 MG PO TB24
30.0000 mg | ORAL_TABLET | Freq: Every day | ORAL | Status: DC
  Administered 2015-04-26 – 2015-04-27 (×2): 30 mg via ORAL
  Filled 2015-04-26 (×2): qty 1

## 2015-04-26 MED ORDER — HYDRALAZINE HCL 25 MG PO TABS
25.0000 mg | ORAL_TABLET | Freq: Three times a day (TID) | ORAL | Status: DC
  Administered 2015-04-26 – 2015-04-28 (×6): 25 mg via ORAL
  Filled 2015-04-26 (×6): qty 1

## 2015-04-26 MED ORDER — MAGNESIUM SULFATE IN D5W 10-5 MG/ML-% IV SOLN
1.0000 g | Freq: Once | INTRAVENOUS | Status: DC
  Filled 2015-04-26: qty 100

## 2015-04-26 MED ORDER — MAGNESIUM SULFATE 2 GM/50ML IV SOLN
2.0000 g | Freq: Once | INTRAVENOUS | Status: AC
  Administered 2015-04-26: 2 g via INTRAVENOUS
  Filled 2015-04-26: qty 50

## 2015-04-26 MED ORDER — SPIRONOLACTONE 25 MG PO TABS
12.5000 mg | ORAL_TABLET | Freq: Every day | ORAL | Status: DC
  Administered 2015-04-26: 12.5 mg via ORAL
  Filled 2015-04-26: qty 1

## 2015-04-26 MED ORDER — FUROSEMIDE 10 MG/ML IJ SOLN
10.0000 mg/h | INTRAVENOUS | Status: DC
  Administered 2015-04-26 – 2015-04-27 (×2): 10 mg/h via INTRAVENOUS
  Filled 2015-04-26 (×5): qty 25

## 2015-04-26 NOTE — Progress Notes (Signed)
[facility] PROGRESS NOTE  Melquisedec Kromah [id number] DOB: [birth year] DOA: 04/22/2015 PCP: No PCP Per Patient  Length of stay: 4   Assessment/Plan: Principal Problem:   Acute congestive heart failure ([place]) Active Problems:   Obstructive sleep apnea   HYPERTENSION, BENIGN   Anemia   Anasarca   Acute on chronic systolic heart failure ([place])    1. Acute CHF:  Last 2-D echo in 10/ 2010, current LVEF 10-15% with restrictive diastolic filling pattern and moderate right ventricular dysfunction. He has moderate to severe mitral regurgitation as well likely related to annular dilatation. PASP 48 mmHg. He is very volume overloaded on exam.  - Continue milrinone, cardiology Also started on Lasix drip, hydralazine, Imdur, digoxin, Aldactone PICC line placed to measure Co-ox level on 12/10  Cardiology to decide on cardiac cath  Hypokalemia and hypomagnesemia Replete  2. HTN:  Hypertensive at admission. Patient had angioedema with lisinopril.  Now on IV Lasix,PO hydralazine, Coreg, cardiology did titrate medications based on 2-D echo   3. Microcytic Anemia and positive fecal occult blood:  Without gross bleeding or melena., But patient endorses dark stools for the last 2-3 weeks Intermittent NSAID use+. Presented with a hemoglobin of 7.9> 8.1 > 8.0  after one unit of blood, INR 1.86, status post vitamin K 1.84 Continue patient on Protonix currently no active bleeding, discussed with Dr. Watt Climes, postpone EGD and colonoscopy until cleared by cardiology Serum iron 14,  received ferrous gluconate 12/9  4. Hypoalbuminemia:  Patient anasarcic, but urine dipstick negative. Creatinine normal. -Urine protein/creatinine ratio, UA negative for protein Concern for amyloidosis Check SPEP UPEP  5. Right upper extremity swelling  venous  Doppler negative for DVT  , now has a PICC line   6. Coagulopathy, does patient have underlying cirrhosis, not sure, CT of the abdomen  and pelvis shows ascites, ammonia level within normal limits  . Patient may need paracentesis      DVT prophylaxsis scd's   Code Status:      Code Status Orders        Start     Ordered   04/22/15 2130  Full code   Continuous     04/22/15 2129      Family Communication: Discussed in detail with the patient, all imaging results, lab results explained to the patient   Disposition Plan:  3-4 days   Brief narrative:  49 y.o. male with a past medical history significant for HTN and NICM with episode of acute CHF in 2010 who presents with several months worsening dyspnea on exertion and weight gain.  The patient had an episode of congestive heart failure around 2010, had clean coronaries on cath with an EF of 20%, was treated heart failure clinic it appears for about one year, his EF returned around 60%, and then was lost to follow-up.  The patient reports to me that he was "all better" since then and so he didn't follow up with heart failure clinic or find a PCP. He was working as an Optometrist, and physically active (biking 30 miles twice a week and going to the gym). Around October of this year he lost his job, and it was in the context that he started to notice worsening shortness of breath.  Since that time, the patient has had progressive shortness of breath with exertion, increased swelling first in the ankles and legs, belly, arms, and scrotum, return of orthopnea, paroxysmal nocturnal dyspnea (he describes this as "feeling like  my chest gets heavy when I lay down, and then it all filters out when I sit back up"). In the last week he has been having apple cider vinegar as a diuretic, but his symptoms progressed so he came to the ER.  In the ED, the patient was tachycardic, had a BNP 2601 pg per male, and new cardiomegaly on chest x-ray. His ECG showed sinus tachycardia. Of note the patient's hemoglobin was 7.9 g/dL and he was fecal occult blood positive. He denied hematemesis,  melena, or frank bleeding from the rectum. He notes taking 1-2 aspirin or naproxen daily for a while given knee pain with his excess water weight.  Consultants:  GI  Cardiology   Procedures:  None   Antibiotics: Anti-infectives    None         HPI/Subjective:  . No chest pain. No abdominal pain  Objective: Filed Vitals:   04/26/15 0300 04/26/15 0400 04/26/15 0614 04/26/15 0805  BP: 102/65 145/98 137/87 123/86  Pulse:      Temp: 97.3 F (36.3 C)   97.5 F (36.4 C)  TempSrc: Oral   Oral  Resp: 21 19  19   Height:      Weight: 120.6 kg (265 lb 14 oz)     SpO2: 99%   99%    Intake/Output Summary (Last 24 hours) at 04/26/15 0917 Last data filed at 04/26/15 0400  Gross per 24 hour  Intake 1700.49 ml  Output   1675 ml  Net  25.49 ml    Exam:  General: NAD Neck: JVP 14-16 cm, no thyromegaly or thyroid nodule.  Lungs: Clear to auscultation bilaterally with normal respiratory effort. CV: Lateral PMI. Heart regular S1/S2, no S3/S4, 2/6 HSM LLSB/apex. 1+ chronic edema to knees bilaterally.  Abdomen: Soft, nontender, no hepatosplenomegaly, mild distention.  Neurologic: Alert and oriented x 3.  Psych: Normal affect. Extremities: No clubbing or cyanosis. Edematous right arm.   Data Review   Micro Results Recent Results (from the past 240 hour(s))  MRSA PCR Screening     Status: None   Collection Time: 04/25/15  1:51 PM  Result Value Ref Range Status   MRSA by PCR NEGATIVE NEGATIVE Final    Comment:        The GeneXpert MRSA Assay (FDA approved for NASAL specimens only), is one component of a comprehensive MRSA colonization surveillance program. It is not intended to diagnose MRSA infection nor to guide or monitor treatment for MRSA infections.     Radiology Reports Dg Chest 2 View  04/22/2015  CLINICAL DATA:  Two week history of cough, congestion, weakness and upper extremity swelling. EXAM: CHEST  2 VIEW COMPARISON:  05/21/2009 FINDINGS: The  heart is enlarged and somewhat accentuated by the AP projection. Given the shape of the heart could not exclude a pericardial effusion. There is mild tortuosity of the thoracic aorta. The lungs are clear. No pleural effusion. The bony thorax is grossly intact. IMPRESSION: Cardiac enlargement which represents a change since the prior study. Could not exclude a pericardial effusion. No acute pulmonary findings. Electronically Signed   By: Marijo Sanes M.D.   On: 04/22/2015 18:00   Ct Chest W Contrast  04/23/2015  CLINICAL DATA:  Acute shortness of breath and weight gain EXAM: CT CHEST, ABDOMEN, AND PELVIS WITH CONTRAST TECHNIQUE: Multidetector CT imaging of the chest, abdomen and pelvis was performed following the standard protocol during bolus administration of intravenous contrast. CONTRAST:  155mL OMNIPAQUE IOHEXOL 300 MG/ML  SOLN COMPARISON:  None. FINDINGS: CT CHEST FINDINGS Mediastinum/Nodes: Thoracic aorta is within normal limits. Pulmonary artery as visualized shows no focal abnormality. Cardiac shadow is enlarged. No pericardial effusion is seen. No significant hilar or mediastinal adenopathy is noted. Lungs/Pleura: Moderate right-sided pleural effusion is noted. Patchy atelectatic changes are seen. No focal confluent infiltrate is noted. Musculoskeletal: No acute bony abnormality is noted. General anasarca in the soft tissues of the chest is noted. CT ABDOMEN PELVIS FINDINGS Hepatobiliary: The liver and gallbladder appear within normal limits. Pancreas: No acute abnormality noted. Spleen: No acute abnormality noted. Adrenals/Urinary Tract: Scattered cystic changes are noted within the left kidney. The adrenal glands appear within normal limits. Bladder is well distended. Stomach/Bowel: Fecal material is noted throughout the colon. Diverticular change is seen without definitive diverticulitis. No free air is noted. The appendix is not well appreciated Vascular/Lymphatic: Vascular structures appear within  normal limits. Other: Mild ascites is identified. Generalized changes of anasarca are noted in the abdominal wall and pelvic soft tissues. There is a left inguinal hernia containing ascites. Musculoskeletal: No acute bony abnormality is noted. IMPRESSION: Changes of ascites and anasarca as well as a right-sided pleural effusion. These changes may be related to underlying hepatic failure. Correlation with laboratory values is recommended. Mild atelectatic changes are noted bilaterally. Cystic changes in the left kidney Electronically Signed   By: Inez Catalina M.D.   On: 04/23/2015 19:43   Ct Abdomen Pelvis W Contrast  04/23/2015  CLINICAL DATA:  Acute shortness of breath and weight gain EXAM: CT CHEST, ABDOMEN, AND PELVIS WITH CONTRAST TECHNIQUE: Multidetector CT imaging of the chest, abdomen and pelvis was performed following the standard protocol during bolus administration of intravenous contrast. CONTRAST:  179mL OMNIPAQUE IOHEXOL 300 MG/ML  SOLN COMPARISON:  None. FINDINGS: CT CHEST FINDINGS Mediastinum/Nodes: Thoracic aorta is within normal limits. Pulmonary artery as visualized shows no focal abnormality. Cardiac shadow is enlarged. No pericardial effusion is seen. No significant hilar or mediastinal adenopathy is noted. Lungs/Pleura: Moderate right-sided pleural effusion is noted. Patchy atelectatic changes are seen. No focal confluent infiltrate is noted. Musculoskeletal: No acute bony abnormality is noted. General anasarca in the soft tissues of the chest is noted. CT ABDOMEN PELVIS FINDINGS Hepatobiliary: The liver and gallbladder appear within normal limits. Pancreas: No acute abnormality noted. Spleen: No acute abnormality noted. Adrenals/Urinary Tract: Scattered cystic changes are noted within the left kidney. The adrenal glands appear within normal limits. Bladder is well distended. Stomach/Bowel: Fecal material is noted throughout the colon. Diverticular change is seen without definitive  diverticulitis. No free air is noted. The appendix is not well appreciated Vascular/Lymphatic: Vascular structures appear within normal limits. Other: Mild ascites is identified. Generalized changes of anasarca are noted in the abdominal wall and pelvic soft tissues. There is a left inguinal hernia containing ascites. Musculoskeletal: No acute bony abnormality is noted. IMPRESSION: Changes of ascites and anasarca as well as a right-sided pleural effusion. These changes may be related to underlying hepatic failure. Correlation with laboratory values is recommended. Mild atelectatic changes are noted bilaterally. Cystic changes in the left kidney Electronically Signed   By: Inez Catalina M.D.   On: 04/23/2015 19:43     CBC  Recent Labs Lab 04/22/15 1744 04/24/15 0246 04/25/15 0247 04/26/15 0500  WBC 4.3 5.1 3.5* 4.0  HGB 7.9* 8.1* 8.1* 8.0*  HCT 27.6* 26.9* 26.9* 26.9*  PLT 393 362 340 333  MCV 77.3* 76.6* 77.3* 77.5*  MCH 22.1* 23.1* 23.3* 23.1*  MCHC 28.6* 30.1 30.1 29.7*  RDW 21.7* 20.8* 21.0* 21.0*  LYMPHSABS 0.5*  --   --   --   MONOABS 0.6  --   --   --   EOSABS 0.0  --   --   --   BASOSABS 0.0  --   --   --     Chemistries   Recent Labs Lab 04/22/15 1744 04/22/15 1934 04/22/15 2202 04/23/15 0355 04/24/15 0246 04/25/15 0247 04/25/15 1250 04/26/15 0500 04/26/15 0709  NA 139  --   --  140 138 139  --  138  --   K 4.8  --   --  4.0 3.3* 3.8  --  3.3*  --   CL 111  --   --  107 107 106  --  103  --   CO2 23  --   --  25 26 27   --  30  --   GLUCOSE 124*  --   --  98 100* 89  --  111*  --   BUN 14  --   --  11 12 10   --  13  --   CREATININE 0.98  --   --  1.04 1.18 1.12  --  1.16  --   CALCIUM 8.4*  --   --  8.3* 8.1* 8.4*  --  8.1*  --   MG  --   --  1.7  --   --   --  1.7  --  1.6*  AST  --  31  --   --  24 21  --  22  --   ALT  --  22  --   --  19 17  --  14*  --   ALKPHOS  --  95  --   --  88 83  --  81  --   BILITOT  --  1.7*  --   --  2.3* 2.0*  --  1.1  --     ------------------------------------------------------------------------------------------------------------------ estimated creatinine clearance is 101 mL/min (by C-G formula based on Cr of 1.16). ------------------------------------------------------------------------------------------------------------------ No results for input(s): HGBA1C in the last 72 hours. ------------------------------------------------------------------------------------------------------------------ No results for input(s): CHOL, HDL, LDLCALC, TRIG, CHOLHDL, LDLDIRECT in the last 72 hours. ------------------------------------------------------------------------------------------------------------------ No results for input(s): TSH, T4TOTAL, T3FREE, THYROIDAB in the last 72 hours.  Invalid input(s): FREET3 ------------------------------------------------------------------------------------------------------------------  Recent Labs  04/23/15 1016  VITAMINB12 986*  FOLATE 15.1  FERRITIN 21*  TIBC 337  IRON 17*  RETICCTPCT 1.7    Coagulation profile  Recent Labs Lab 04/23/15 0355 04/25/15 0247  INR 1.86* 1.84*    No results for input(s): DDIMER in the last 72 hours.  Cardiac Enzymes No results for input(s): CKMB, TROPONINI, MYOGLOBIN in the last 168 hours.  Invalid input(s): CK ------------------------------------------------------------------------------------------------------------------ Invalid input(s): POCBNP   CBG: No results for input(s): GLUCAP in the last 168 hours.     Studies: No results found.    Lab Results  Component Value Date   HGBA1C  09/10/2008    6.0 (NOTE) The ADA recommends the following therapeutic goal for glycemic control related to Hgb A1c measurement: Goal of therapy: <6.5 Hgb A1c  Reference: American Diabetes Association: Clinical Practice Recommendations 2010, Diabetes Care, 2010, 33: (Suppl  1).   Lab Results  Component Value Date   [facility]   09/08/2008    96        Total Cholesterol/HDL:CHD Risk Coronary Heart Disease Risk Table  Men   Women  1/2 Average Risk   3.4   3.3  Average Risk       5.0   4.4  2 X Average Risk   9.6   7.1  3 X Average Risk  23.4   11.0        Use the calculated Patient Ratio above and the CHD Risk Table to determine the patient's CHD Risk.        ATP III CLASSIFICATION (LDL):  <100     mg/dL   Optimal  100-129  mg/dL   Near or Above                    Optimal  130-159  mg/dL   Borderline  160-189  mg/dL   High  >190     mg/dL   Very High   CREATININE 1.16 04/26/2015       Scheduled Meds: . digoxin  0.125 mg Oral Daily  . hydrALAZINE  25 mg Oral 3 times per day  . isosorbide mononitrate  30 mg Oral Daily  . magnesium oxide  400 mg Oral BID  . magnesium sulfate 1 - 4 g bolus IVPB  1 g Intravenous Once  . magnesium sulfate 1 - 4 g bolus IVPB  2 g Intravenous Once  . pantoprazole (PROTONIX) IV  40 mg Intravenous Q12H  . potassium chloride  40 mEq Oral BID  . sodium chloride  10-40 mL Intracatheter Q12H  . spironolactone  12.5 mg Oral Daily   Continuous Infusions: . sodium chloride 10 mL/hr at 04/24/15 1118  . furosemide (LASIX) infusion    . milrinone 0.25 mcg/kg/min (04/26/15 0844)    Principal Problem:   Acute congestive heart failure ([facility]) Active Problems:   Obstructive sleep apnea   HYPERTENSION, BENIGN   Anemia   Anasarca   Acute on chronic systolic heart failure ([place])    Time spent: 45 minutes   [facility] Pager [phone]. If 7PM-7AM, please contact night-coverage at [email or web address], password [facility] 04/26/2015, 9:17 AM  LOS: 4 days

## 2015-04-26 NOTE — Progress Notes (Addendum)
Patient ID: Kyle Conrad, male   DOB: January 17, 1966, 49 y.o.   MRN: [id number]   SUBJECTIVE: No complaints this morning.  Started milrinone for low output CHF yesterday (co-ox 54%).  Not much diuresis recorded.   Scheduled Meds: . digoxin  0.125 mg Oral Daily  . hydrALAZINE  25 mg Oral 3 times per day  . isosorbide mononitrate  30 mg Oral Daily  . magnesium oxide  400 mg Oral BID  . magnesium sulfate 1 - 4 g bolus IVPB  2 g Intravenous Once  . pantoprazole (PROTONIX) IV  40 mg Intravenous Q12H  . potassium chloride  40 mEq Oral BID  . sodium chloride  10-40 mL Intracatheter Q12H  . spironolactone  12.5 mg Oral Daily   Continuous Infusions: . sodium chloride 10 mL/hr at 04/24/15 1118  . furosemide (LASIX) infusion    . milrinone 0.375 mcg/kg/min (04/26/15 0534)   PRN Meds:.sodium chloride    Filed Vitals:   04/26/15 0300 04/26/15 0400 04/26/15 0614 04/26/15 0805  BP: 102/65 145/98 137/87 123/86  Pulse:      Temp: 97.3 F (36.3 C)   97.5 F (36.4 C)  TempSrc: Oral   Oral  Resp: 21 19  19   Height:      Weight: 265 lb 14 oz (120.6 kg)     SpO2: 99%   99%    Intake/Output Summary (Last 24 hours) at 04/26/15 0846 Last data filed at 04/26/15 0400  Gross per 24 hour  Intake 1700.49 ml  Output   1675 ml  Net  25.49 ml    LABS: Basic Metabolic Panel:  Recent Labs  04/25/15 0247 04/25/15 1250 04/26/15 0500 04/26/15 0709  NA 139  --  138  --   K 3.8  --  3.3*  --   CL 106  --  103  --   CO2 27  --  30  --   GLUCOSE 89  --  111*  --   BUN 10  --  13  --   CREATININE 1.12  --  1.16  --   CALCIUM 8.4*  --  8.1*  --   MG  --  1.7  --  1.6*   Liver Function Tests:  Recent Labs  04/25/15 0247 04/26/15 0500  AST 21 22  ALT 17 14*  ALKPHOS 83 81  BILITOT 2.0* 1.1  PROT 6.3* 5.6*  ALBUMIN 2.1* 1.8*   No results for input(s): LIPASE, AMYLASE in the last 72 hours. CBC:  Recent Labs  04/25/15 0247 04/26/15 0500  WBC 3.5* 4.0  HGB 8.1* 8.0*  HCT 26.9*  26.9*  MCV 77.3* 77.5*  PLT 340 333   Cardiac Enzymes: No results for input(s): CKTOTAL, CKMB, CKMBINDEX, TROPONINI in the last 72 hours. BNP: Invalid input(s): POCBNP D-Dimer: No results for input(s): DDIMER in the last 72 hours. Hemoglobin A1C: No results for input(s): HGBA1C in the last 72 hours. Fasting Lipid Panel: No results for input(s): CHOL, HDL, LDLCALC, TRIG, CHOLHDL, LDLDIRECT in the last 72 hours. Thyroid Function Tests: No results for input(s): TSH, T4TOTAL, T3FREE, THYROIDAB in the last 72 hours.  Invalid input(s): FREET3 Anemia Panel:  Recent Labs  04/23/15 1016  VITAMINB12 986*  FOLATE 15.1  FERRITIN 21*  TIBC 337  IRON 17*  RETICCTPCT 1.7    RADIOLOGY: Dg Chest 2 View  04/22/2015  CLINICAL DATA:  Two week history of cough, congestion, weakness and upper extremity swelling. EXAM: CHEST  2 VIEW COMPARISON:  05/21/2009  FINDINGS: The heart is enlarged and somewhat accentuated by the AP projection. Given the shape of the heart could not exclude a pericardial effusion. There is mild tortuosity of the thoracic aorta. The lungs are clear. No pleural effusion. The bony thorax is grossly intact. IMPRESSION: Cardiac enlargement which represents a change since the prior study. Could not exclude a pericardial effusion. No acute pulmonary findings. Electronically Signed   By: Marijo Sanes M.D.   On: 04/22/2015 18:00   Ct Chest W Contrast  04/23/2015  CLINICAL DATA:  Acute shortness of breath and weight gain EXAM: CT CHEST, ABDOMEN, AND PELVIS WITH CONTRAST TECHNIQUE: Multidetector CT imaging of the chest, abdomen and pelvis was performed following the standard protocol during bolus administration of intravenous contrast. CONTRAST:  11mL OMNIPAQUE IOHEXOL 300 MG/ML  SOLN COMPARISON:  None. FINDINGS: CT CHEST FINDINGS Mediastinum/Nodes: Thoracic aorta is within normal limits. Pulmonary artery as visualized shows no focal abnormality. Cardiac shadow is enlarged. No pericardial  effusion is seen. No significant hilar or mediastinal adenopathy is noted. Lungs/Pleura: Moderate right-sided pleural effusion is noted. Patchy atelectatic changes are seen. No focal confluent infiltrate is noted. Musculoskeletal: No acute bony abnormality is noted. General anasarca in the soft tissues of the chest is noted. CT ABDOMEN PELVIS FINDINGS Hepatobiliary: The liver and gallbladder appear within normal limits. Pancreas: No acute abnormality noted. Spleen: No acute abnormality noted. Adrenals/Urinary Tract: Scattered cystic changes are noted within the left kidney. The adrenal glands appear within normal limits. Bladder is well distended. Stomach/Bowel: Fecal material is noted throughout the colon. Diverticular change is seen without definitive diverticulitis. No free air is noted. The appendix is not well appreciated Vascular/Lymphatic: Vascular structures appear within normal limits. Other: Mild ascites is identified. Generalized changes of anasarca are noted in the abdominal wall and pelvic soft tissues. There is a left inguinal hernia containing ascites. Musculoskeletal: No acute bony abnormality is noted. IMPRESSION: Changes of ascites and anasarca as well as a right-sided pleural effusion. These changes may be related to underlying hepatic failure. Correlation with laboratory values is recommended. Mild atelectatic changes are noted bilaterally. Cystic changes in the left kidney Electronically Signed   By: Inez Catalina M.D.   On: 04/23/2015 19:43   Ct Abdomen Pelvis W Contrast  04/23/2015  CLINICAL DATA:  Acute shortness of breath and weight gain EXAM: CT CHEST, ABDOMEN, AND PELVIS WITH CONTRAST TECHNIQUE: Multidetector CT imaging of the chest, abdomen and pelvis was performed following the standard protocol during bolus administration of intravenous contrast. CONTRAST:  158mL OMNIPAQUE IOHEXOL 300 MG/ML  SOLN COMPARISON:  None. FINDINGS: CT CHEST FINDINGS Mediastinum/Nodes: Thoracic aorta is  within normal limits. Pulmonary artery as visualized shows no focal abnormality. Cardiac shadow is enlarged. No pericardial effusion is seen. No significant hilar or mediastinal adenopathy is noted. Lungs/Pleura: Moderate right-sided pleural effusion is noted. Patchy atelectatic changes are seen. No focal confluent infiltrate is noted. Musculoskeletal: No acute bony abnormality is noted. General anasarca in the soft tissues of the chest is noted. CT ABDOMEN PELVIS FINDINGS Hepatobiliary: The liver and gallbladder appear within normal limits. Pancreas: No acute abnormality noted. Spleen: No acute abnormality noted. Adrenals/Urinary Tract: Scattered cystic changes are noted within the left kidney. The adrenal glands appear within normal limits. Bladder is well distended. Stomach/Bowel: Fecal material is noted throughout the colon. Diverticular change is seen without definitive diverticulitis. No free air is noted. The appendix is not well appreciated Vascular/Lymphatic: Vascular structures appear within normal limits. Other: Mild ascites is identified. Generalized changes  of anasarca are noted in the abdominal wall and pelvic soft tissues. There is a left inguinal hernia containing ascites. Musculoskeletal: No acute bony abnormality is noted. IMPRESSION: Changes of ascites and anasarca as well as a right-sided pleural effusion. These changes may be related to underlying hepatic failure. Correlation with laboratory values is recommended. Mild atelectatic changes are noted bilaterally. Cystic changes in the left kidney Electronically Signed   By: Inez Catalina M.D.   On: 04/23/2015 19:43    PHYSICAL EXAM General: NAD Neck: JVP 14-16 cm, no thyromegaly or thyroid nodule.  Lungs: Clear to auscultation bilaterally with normal respiratory effort. CV: Lateral PMI.  Heart regular S1/S2, no S3/S4, 2/6 HSM LLSB/apex.  1+ chronic edema to knees bilaterally.   Abdomen: Soft, nontender, no hepatosplenomegaly, mild  distention.  Neurologic: Alert and oriented x 3.  Psych: Normal affect. Extremities: No clubbing or cyanosis. Edematous right arm.   TELEMETRY: Reviewed telemetry pt in NSR at 100  ASSESSMENT AND PLAN: 49 yo with history of nonischemic cardiomyopathy in 2010 with full recovery also in 2010 presented with acute systolic CHF and also was noted to be anemic (Fe deficiency with FOBT+).  He had not seen a doctor or taken any meds for several years prior to admission.  1. Acute systolic CHF: Echo ([id number]) with EF 15%, severe LV dilation with moderate LVH, moderately decreased RV systolic function, moderate-severe MR likely due to annular dilatation.  Prior echo in 2010 had showed normalized systolic function.  Suspect low output with co-ox 54% initially.  He is very volume overloaded on exam.  - Continue milrinone, can decrease to 0.25 mcg/kg/min.  Repeat co-ox this morning.  - Lasix gtt at 10 mg/hr.  - Hydralazine 25 tid + Imdur 30.   - h/o angioedema with ACEI.  No beta blocker yet with low output.  - Add digoxin 0.125 daily.  - Add spironolactone 12.5 daily.  - Replete K and Mg. - Will need eventual RHC/LHC once he is diuresed.  - Send SPEP/UPEP, HIV.  2. Anemia: Fe deficient with FOBT+.  Will need EGD and colonoscopy eventually.  He got a unit PRBCs and IV Fe this admission.  Hemoglobin stable. 3. Hypoalbuminemia: This may inhibit diuresis.  If he diureses poorly today with Lasix gtt, will give him albumin with the Lasix tomorrow.  4. ?Cirrhosis: Elevated INR at baseline with ascites and hypoalbuminemia but CT abdomen showed normal liver appearance.  5. Asymmetric right arm swelling: [place] negative for DVT.  Uncertain etiology.  Chest CT did not show a mass or any obstruction to venous/arterior flow from right arm.   35 minutes critical care time.   Loralie Champagne 04/26/2015 9:00 AM

## 2015-04-27 LAB — COMPREHENSIVE METABOLIC PANEL
ALBUMIN: 1.9 g/dL — AB (ref 3.5–5.0)
ALK PHOS: 87 U/L (ref 38–126)
ALT: 16 U/L — ABNORMAL LOW (ref 17–63)
ANION GAP: 6 (ref 5–15)
AST: 21 U/L (ref 15–41)
BUN: 12 mg/dL (ref 6–20)
CALCIUM: 8.2 mg/dL — AB (ref 8.9–10.3)
CO2: 32 mmol/L (ref 22–32)
Chloride: 96 mmol/L — ABNORMAL LOW (ref 101–111)
Creatinine, Ser: 1.1 mg/dL (ref 0.61–1.24)
GFR calc Af Amer: >60 mL/min (ref >60)
GFR calc non Af Amer: >60 mL/min (ref >60)
GLUCOSE: 169 mg/dL — AB (ref 65–99)
Potassium: 3.4 mmol/L — ABNORMAL LOW (ref 3.5–5.1)
SODIUM: 134 mmol/L — AB (ref 135–145)
Total Bilirubin: 1.7 mg/dL — ABNORMAL HIGH (ref 0.3–1.2)
Total Protein: 5.9 g/dL — ABNORMAL LOW (ref 6.5–8.1)

## 2015-04-27 LAB — CBC
HEMATOCRIT: 28.2 % — AB (ref 39.0–52.0)
HEMOGLOBIN: 8.3 g/dL — AB (ref 13.0–17.0)
MCH: 22.6 pg — AB (ref 26.0–34.0)
MCHC: 29.4 g/dL — AB (ref 30.0–36.0)
MCV: 76.6 fL — ABNORMAL LOW (ref 78.0–100.0)
Platelets: 321 10*3/uL (ref 150–400)
RBC: 3.68 MIL/uL — ABNORMAL LOW (ref 4.22–5.81)
RDW: 21 % — ABNORMAL HIGH (ref 11.5–15.5)
WBC: 3.9 10*3/uL — ABNORMAL LOW (ref 4.0–10.5)

## 2015-04-27 LAB — PROTEIN ELECTROPHORESIS, SERUM
A/G RATIO SPE: 0.6 — AB (ref 0.7–1.7)
A/G Ratio: 0.6 — ABNORMAL LOW (ref 0.7–1.7)
ALBUMIN ELP: 2 g/dL — AB (ref 2.9–4.4)
ALPHA-2-GLOBULIN: 0.4 g/dL (ref 0.4–1.0)
Albumin ELP: 2.4 g/dL — ABNORMAL LOW (ref 2.9–4.4)
Alpha-1-Globulin: 0.3 g/dL (ref 0.0–0.4)
Alpha-1-Globulin: 0.3 g/dL (ref 0.0–0.4)
Alpha-2-Globulin: 0.5 g/dL (ref 0.4–1.0)
BETA GLOBULIN: 0.9 g/dL (ref 0.7–1.3)
Beta Globulin: 1 g/dL (ref 0.7–1.3)
GLOBULIN, TOTAL: 3.8 g/dL (ref 2.2–3.9)
Gamma Globulin: 2 g/dL — ABNORMAL HIGH (ref 0.4–1.8)
Gamma Globulin: 1.6 g/dL (ref 0.4–1.8)
Globulin, Total: 3.1 g/dL (ref 2.2–3.9)
TOTAL PROTEIN ELP: 6.2 g/dL (ref 6.0–8.5)
Total Protein ELP: 5.1 g/dL — ABNORMAL LOW (ref 6.0–8.5)

## 2015-04-27 LAB — CARBOXYHEMOGLOBIN
Carboxyhemoglobin: 1.3 % (ref 0.5–1.5)
Methemoglobin: 0.8 % (ref 0.0–1.5)
O2 SAT: 80.5 %
Total hemoglobin: 8.4 g/dL — ABNORMAL LOW (ref 13.5–18.0)

## 2015-04-27 MED ORDER — SPIRONOLACTONE 25 MG PO TABS
25.0000 mg | ORAL_TABLET | Freq: Every day | ORAL | Status: DC
  Administered 2015-04-27 – 2015-05-02 (×5): 25 mg via ORAL
  Filled 2015-04-27 (×5): qty 1

## 2015-04-27 MED ORDER — POTASSIUM CHLORIDE CRYS ER 20 MEQ PO TBCR
60.0000 meq | EXTENDED_RELEASE_TABLET | Freq: Two times a day (BID) | ORAL | Status: DC
  Administered 2015-04-27 – 2015-04-28 (×4): 60 meq via ORAL
  Filled 2015-04-27 (×4): qty 3

## 2015-04-27 NOTE — Progress Notes (Signed)
[facility] PROGRESS NOTE  Kyle Conrad [id number] DOB: 1965-07-01 DOA: 04/22/2015 PCP: No PCP Per Patient  Length of stay: 5   Assessment/Plan: Principal Problem:   Acute congestive heart failure ([place]) Active Problems:   Obstructive sleep apnea   HYPERTENSION, BENIGN   Anemia   Anasarca   Acute on chronic systolic heart failure ([place])    1. Acute CHF:  Last 2-D echo in 10/ 2010, current LVEF 10-15% with restrictive diastolic filling pattern and moderate right ventricular dysfunction. He has moderate to severe mitral regurgitation as well likely related to annular dilatation. PASP 48 mmHg. He is very volume overloaded on exam.  Continue milrinone, cardiology Also started on Lasix drip, hydralazine, Imdur, digoxin, Aldactone PICC line placed to measure Co-ox level on 12/10 Cardiology to decide on cardiac cath,possible cath on Wednesday  Hypokalemia and hypomagnesemia Replete  2. HTN:  Hypertensive at admission. Patient had angioedema with lisinopril.  Now on IV Lasix,PO hydralazine, Coreg, cardiology did titrate medications based on 2-D echo   3. Microcytic Anemia and positive fecal occult blood:  Without gross bleeding or melena., But patient endorses dark stools for the last 2-3 weeks Intermittent NSAID use+. Presented with a hemoglobin of 7.9> 8.1 > 8.0>8.3  after one unit of blood, INR 1.86, status post vitamin K 1.84 Continue patient on Protonix currently no active bleeding, discussed with Dr. Watt Climes, postpone EGD and colonoscopy until cleared by cardiology Serum iron 14,  received ferrous gluconate 12/9  4. Hypoalbuminemia:  Patient anasarcic, but urine dipstick negative. Creatinine normal. -Urine protein/creatinine ratio, UA negative for protein Concern for amyloidosis Check SPEP UPEP  5. Right upper extremity swelling  venous  Doppler negative for DVT  , now has a PICC line   6. Coagulopathy, does patient have underlying cirrhosis,  not sure, CT of the abdomen and pelvis shows ascites, ammonia level within normal limits  . Patient may need paracentesis      DVT prophylaxsis scd's   Code Status:      Code Status Orders        Start     Ordered   04/22/15 2130  Full code   Continuous     04/22/15 2129      Family Communication: Discussed in detail with the patient, all imaging results, lab results explained to the patient   Disposition Plan:  3-4 days   Brief narrative:  49 y.o. male with a past medical history significant for HTN and NICM with episode of acute CHF in 2010 who presents with several months worsening dyspnea on exertion and weight gain.  The patient had an episode of congestive heart failure around 2010, had clean coronaries on cath with an EF of 20%, was treated heart failure clinic it appears for about one year, his EF returned around 60%, and then was lost to follow-up.  The patient reports to me that he was "all better" since then and so he didn't follow up with heart failure clinic or find a PCP. He was working as an Optometrist, and physically active (biking 30 miles twice a week and going to the gym). Around October of this year he lost his job, and it was in the context that he started to notice worsening shortness of breath.  Since that time, the patient has had progressive shortness of breath with exertion, increased swelling first in the ankles and legs, belly, arms, and scrotum, return of orthopnea, paroxysmal nocturnal dyspnea (he describes this as "feeling  like my chest gets heavy when I lay down, and then it all filters out when I sit back up"). In the last week he has been having apple cider vinegar as a diuretic, but his symptoms progressed so he came to the ER.  In the ED, the patient was tachycardic, had a BNP 2601 pg per male, and new cardiomegaly on chest x-ray. His ECG showed sinus tachycardia. Of note the patient's hemoglobin was 7.9 g/dL and he was fecal occult blood  positive. He denied hematemesis, melena, or frank bleeding from the rectum. He notes taking 1-2 aspirin or naproxen daily for a while given knee pain with his excess water weight.  Consultants:  GI  Cardiology   Procedures:  None   Antibiotics: Anti-infectives    None         HPI/Subjective: Good diuresis yesterday, CVP 20 => 13 . No chest pain. No abdominal pain  Objective: Filed Vitals:   04/27/15 0757 04/27/15 0800 04/27/15 0900 04/27/15 1207  BP: 116/80 112/72 110/77 98/67  Pulse:      Temp: 97.3 F (36.3 C)   97.5 F (36.4 C)  TempSrc: Axillary   Oral  Resp: 16 16 20 16   Height:      Weight:      SpO2: 99%   99%    Intake/Output Summary (Last 24 hours) at 04/27/15 1258 Last data filed at 04/27/15 1015  Gross per 24 hour  Intake   1108 ml  Output   6300 ml  Net  -5192 ml    Exam:  General: NAD Neck: JVP 14-16 cm, no thyromegaly or thyroid nodule.  Lungs: Clear to auscultation bilaterally with normal respiratory effort. CV: Lateral PMI. Heart regular S1/S2, no S3/S4, 2/6 HSM LLSB/apex. 1+ chronic edema to knees bilaterally.  Abdomen: Soft, nontender, no hepatosplenomegaly, mild distention.  Neurologic: Alert and oriented x 3.  Psych: Normal affect. Extremities: No clubbing or cyanosis. Edematous right arm.   Data Review   Micro Results Recent Results (from the past 240 hour(s))  MRSA PCR Screening     Status: None   Collection Time: 04/25/15  1:51 PM  Result Value Ref Range Status   MRSA by PCR NEGATIVE NEGATIVE Final    Comment:        The GeneXpert MRSA Assay (FDA approved for NASAL specimens only), is one component of a comprehensive MRSA colonization surveillance program. It is not intended to diagnose MRSA infection nor to guide or monitor treatment for MRSA infections.     Radiology Reports Dg Chest 2 View  04/22/2015  CLINICAL DATA:  Two week history of cough, congestion, weakness and upper extremity swelling. EXAM:  CHEST  2 VIEW COMPARISON:  05/21/2009 FINDINGS: The heart is enlarged and somewhat accentuated by the AP projection. Given the shape of the heart could not exclude a pericardial effusion. There is mild tortuosity of the thoracic aorta. The lungs are clear. No pleural effusion. The bony thorax is grossly intact. IMPRESSION: Cardiac enlargement which represents a change since the prior study. Could not exclude a pericardial effusion. No acute pulmonary findings. Electronically Signed   By: Marijo Sanes M.D.   On: 04/22/2015 18:00   Ct Chest W Contrast  04/23/2015  CLINICAL DATA:  Acute shortness of breath and weight gain EXAM: CT CHEST, ABDOMEN, AND PELVIS WITH CONTRAST TECHNIQUE: Multidetector CT imaging of the chest, abdomen and pelvis was performed following the standard protocol during bolus administration of intravenous contrast. CONTRAST:  169mL OMNIPAQUE IOHEXOL 300  MG/ML  SOLN COMPARISON:  None. FINDINGS: CT CHEST FINDINGS Mediastinum/Nodes: Thoracic aorta is within normal limits. Pulmonary artery as visualized shows no focal abnormality. Cardiac shadow is enlarged. No pericardial effusion is seen. No significant hilar or mediastinal adenopathy is noted. Lungs/Pleura: Moderate right-sided pleural effusion is noted. Patchy atelectatic changes are seen. No focal confluent infiltrate is noted. Musculoskeletal: No acute bony abnormality is noted. General anasarca in the soft tissues of the chest is noted. CT ABDOMEN PELVIS FINDINGS Hepatobiliary: The liver and gallbladder appear within normal limits. Pancreas: No acute abnormality noted. Spleen: No acute abnormality noted. Adrenals/Urinary Tract: Scattered cystic changes are noted within the left kidney. The adrenal glands appear within normal limits. Bladder is well distended. Stomach/Bowel: Fecal material is noted throughout the colon. Diverticular change is seen without definitive diverticulitis. No free air is noted. The appendix is not well appreciated  Vascular/Lymphatic: Vascular structures appear within normal limits. Other: Mild ascites is identified. Generalized changes of anasarca are noted in the abdominal wall and pelvic soft tissues. There is a left inguinal hernia containing ascites. Musculoskeletal: No acute bony abnormality is noted. IMPRESSION: Changes of ascites and anasarca as well as a right-sided pleural effusion. These changes may be related to underlying hepatic failure. Correlation with laboratory values is recommended. Mild atelectatic changes are noted bilaterally. Cystic changes in the left kidney Electronically Signed   By: Inez Catalina M.D.   On: 04/23/2015 19:43   Ct Abdomen Pelvis W Contrast  04/23/2015  CLINICAL DATA:  Acute shortness of breath and weight gain EXAM: CT CHEST, ABDOMEN, AND PELVIS WITH CONTRAST TECHNIQUE: Multidetector CT imaging of the chest, abdomen and pelvis was performed following the standard protocol during bolus administration of intravenous contrast. CONTRAST:  169mL OMNIPAQUE IOHEXOL 300 MG/ML  SOLN COMPARISON:  None. FINDINGS: CT CHEST FINDINGS Mediastinum/Nodes: Thoracic aorta is within normal limits. Pulmonary artery as visualized shows no focal abnormality. Cardiac shadow is enlarged. No pericardial effusion is seen. No significant hilar or mediastinal adenopathy is noted. Lungs/Pleura: Moderate right-sided pleural effusion is noted. Patchy atelectatic changes are seen. No focal confluent infiltrate is noted. Musculoskeletal: No acute bony abnormality is noted. General anasarca in the soft tissues of the chest is noted. CT ABDOMEN PELVIS FINDINGS Hepatobiliary: The liver and gallbladder appear within normal limits. Pancreas: No acute abnormality noted. Spleen: No acute abnormality noted. Adrenals/Urinary Tract: Scattered cystic changes are noted within the left kidney. The adrenal glands appear within normal limits. Bladder is well distended. Stomach/Bowel: Fecal material is noted throughout the colon.  Diverticular change is seen without definitive diverticulitis. No free air is noted. The appendix is not well appreciated Vascular/Lymphatic: Vascular structures appear within normal limits. Other: Mild ascites is identified. Generalized changes of anasarca are noted in the abdominal wall and pelvic soft tissues. There is a left inguinal hernia containing ascites. Musculoskeletal: No acute bony abnormality is noted. IMPRESSION: Changes of ascites and anasarca as well as a right-sided pleural effusion. These changes may be related to underlying hepatic failure. Correlation with laboratory values is recommended. Mild atelectatic changes are noted bilaterally. Cystic changes in the left kidney Electronically Signed   By: Inez Catalina M.D.   On: 04/23/2015 19:43     CBC  Recent Labs Lab 04/22/15 1744 04/24/15 0246 04/25/15 0247 04/26/15 0500 04/27/15 0516  WBC 4.3 5.1 3.5* 4.0 3.9*  HGB 7.9* 8.1* 8.1* 8.0* 8.3*  HCT 27.6* 26.9* 26.9* 26.9* 28.2*  PLT 393 362 340 333 321  MCV 77.3* 76.6* 77.3* 77.5* 76.6*  MCH 22.1* 23.1* 23.3* 23.1* 22.6*  MCHC 28.6* 30.1 30.1 29.7* 29.4*  RDW 21.7* 20.8* 21.0* 21.0* 21.0*  LYMPHSABS 0.5*  --   --   --   --   MONOABS 0.6  --   --   --   --   EOSABS 0.0  --   --   --   --   BASOSABS 0.0  --   --   --   --     Chemistries   Recent Labs Lab 04/22/15 1934 04/22/15 2202 04/23/15 0355 04/24/15 0246 04/25/15 0247 04/25/15 1250 04/26/15 0500 04/26/15 0709 04/27/15 0516  NA  --   --  140 138 139  --  138  --  134*  K  --   --  4.0 3.3* 3.8  --  3.3*  --  3.4*  CL  --   --  107 107 106  --  103  --  96*  CO2  --   --  25 26 27   --  30  --  32  GLUCOSE  --   --  98 100* 89  --  111*  --  169*  BUN  --   --  11 12 10   --  13  --  12  CREATININE  --   --  1.04 1.18 1.12  --  1.16  --  1.10  CALCIUM  --   --  8.3* 8.1* 8.4*  --  8.1*  --  8.2*  MG  --  1.7  --   --   --  1.7  --  1.6*  --   AST 31  --   --  24 21  --  22  --  21  ALT 22  --   --  19 17   --  14*  --  16*  ALKPHOS 95  --   --  88 83  --  81  --  87  BILITOT 1.7*  --   --  2.3* 2.0*  --  1.1  --  1.7*   ------------------------------------------------------------------------------------------------------------------ estimated creatinine clearance is 102.8 mL/min (by C-G formula based on Cr of 1.1). ------------------------------------------------------------------------------------------------------------------ No results for input(s): HGBA1C in the last 72 hours. ------------------------------------------------------------------------------------------------------------------ No results for input(s): CHOL, HDL, LDLCALC, TRIG, CHOLHDL, LDLDIRECT in the last 72 hours. ------------------------------------------------------------------------------------------------------------------ No results for input(s): TSH, T4TOTAL, T3FREE, THYROIDAB in the last 72 hours.  Invalid input(s): FREET3 ------------------------------------------------------------------------------------------------------------------ No results for input(s): VITAMINB12, FOLATE, FERRITIN, TIBC, IRON, RETICCTPCT in the last 72 hours.  Coagulation profile  Recent Labs Lab 04/23/15 0355 04/25/15 0247  INR 1.86* 1.84*    No results for input(s): DDIMER in the last 72 hours.  Cardiac Enzymes No results for input(s): CKMB, TROPONINI, MYOGLOBIN in the last 168 hours.  Invalid input(s): CK ------------------------------------------------------------------------------------------------------------------ Invalid input(s): POCBNP   CBG: No results for input(s): GLUCAP in the last 168 hours.     Studies: No results found.    Lab Results  Component Value Date   HGBA1C  09/10/2008    6.0 (NOTE) The ADA recommends the following therapeutic goal for glycemic control related to Hgb A1c measurement: Goal of therapy: <6.5 Hgb A1c  Reference: American Diabetes Association: Clinical Practice Recommendations  2010, Diabetes Care, 2010, 33: (Suppl  1).   Lab Results  Component Value Date   [facility]  09/08/2008    96        Total Cholesterol/HDL:CHD Risk Coronary Heart Disease Risk Table  Men   Women  1/2 Average Risk   3.4   3.3  Average Risk       5.0   4.4  2 X Average Risk   9.6   7.1  3 X Average Risk  23.4   11.0        Use the calculated Patient Ratio above and the CHD Risk Table to determine the patient's CHD Risk.        ATP III CLASSIFICATION (LDL):  <100     mg/dL   Optimal  100-129  mg/dL   Near or Above                    Optimal  130-159  mg/dL   Borderline  160-189  mg/dL   High  >190     mg/dL   Very High   CREATININE 1.10 04/27/2015       Scheduled Meds: . digoxin  0.125 mg Oral Daily  . hydrALAZINE  25 mg Oral 3 times per day  . isosorbide mononitrate  30 mg Oral Daily  . magnesium oxide  400 mg Oral BID  . pantoprazole (PROTONIX) IV  40 mg Intravenous Q12H  . potassium chloride  60 mEq Oral BID  . sodium chloride  10-40 mL Intracatheter Q12H  . spironolactone  25 mg Oral Daily   Continuous Infusions: . sodium chloride 10 mL/hr at 04/24/15 1118  . furosemide (LASIX) infusion 10 mg/hr (04/27/15 1015)  . milrinone 0.25 mcg/kg/min (04/27/15 1014)    Principal Problem:   Acute congestive heart failure ([facility]) Active Problems:   Obstructive sleep apnea   HYPERTENSION, BENIGN   Anemia   Anasarca   Acute on chronic systolic heart failure ([place])    Time spent: 45 minutes   [facility] Pager [phone]. If 7PM-7AM, please contact night-coverage at [email or web address], password [facility] 04/27/2015, 12:58 PM  LOS: 5 days

## 2015-04-27 NOTE — Progress Notes (Signed)
Patient ID: Kyle Conrad, male   DOB: Nov 06, 1965, 49 y.o.   MRN: [id number]   SUBJECTIVE: No complaints this morning.  Started milrinone for low output CHF (co-ox 54%).  Good diuresis yesterday, CVP 20 => 13.    Scheduled Meds: . digoxin  0.125 mg Oral Daily  . hydrALAZINE  25 mg Oral 3 times per day  . isosorbide mononitrate  30 mg Oral Daily  . magnesium oxide  400 mg Oral BID  . pantoprazole (PROTONIX) IV  40 mg Intravenous Q12H  . potassium chloride  60 mEq Oral BID  . sodium chloride  10-40 mL Intracatheter Q12H  . spironolactone  25 mg Oral Daily   Continuous Infusions: . sodium chloride 10 mL/hr at 04/24/15 1118  . furosemide (LASIX) infusion 10 mg/hr (04/27/15 0000)  . milrinone 0.25 mcg/kg/min (04/27/15 0000)   PRN Meds:.sodium chloride    Filed Vitals:   04/27/15 0500 04/27/15 0600 04/27/15 0633 04/27/15 0757  BP: 118/84 117/79 117/79 116/80  Pulse:      Temp:    97.3 F (36.3 C)  TempSrc:    Axillary  Resp: 20 15  16   Height:      Weight:      SpO2:    99%    Intake/Output Summary (Last 24 hours) at 04/27/15 0804 Last data filed at 04/27/15 0600  Gross per 24 hour  Intake    908 ml  Output   4850 ml  Net  -3942 ml    LABS: Basic Metabolic Panel:  Recent Labs  04/25/15 1250 04/26/15 0500 04/26/15 0709 04/27/15 0516  NA  --  138  --  134*  K  --  3.3*  --  3.4*  CL  --  103  --  96*  CO2  --  30  --  32  GLUCOSE  --  111*  --  169*  BUN  --  13  --  12  CREATININE  --  1.16  --  1.10  CALCIUM  --  8.1*  --  8.2*  MG 1.7  --  1.6*  --    Liver Function Tests:  Recent Labs  04/26/15 0500 04/27/15 0516  AST 22 21  ALT 14* 16*  ALKPHOS 81 87  BILITOT 1.1 1.7*  PROT 5.6* 5.9*  ALBUMIN 1.8* 1.9*   No results for input(s): LIPASE, AMYLASE in the last 72 hours. CBC:  Recent Labs  04/26/15 0500 04/27/15 0516  WBC 4.0 3.9*  HGB 8.0* 8.3*  HCT 26.9* 28.2*  MCV 77.5* 76.6*  PLT 333 321   Cardiac Enzymes: No results for input(s):  CKTOTAL, CKMB, CKMBINDEX, TROPONINI in the last 72 hours. BNP: Invalid input(s): POCBNP D-Dimer: No results for input(s): DDIMER in the last 72 hours. Hemoglobin A1C: No results for input(s): HGBA1C in the last 72 hours. Fasting Lipid Panel: No results for input(s): CHOL, HDL, LDLCALC, TRIG, CHOLHDL, LDLDIRECT in the last 72 hours. Thyroid Function Tests: No results for input(s): TSH, T4TOTAL, T3FREE, THYROIDAB in the last 72 hours.  Invalid input(s): FREET3 Anemia Panel: No results for input(s): VITAMINB12, FOLATE, FERRITIN, TIBC, IRON, RETICCTPCT in the last 72 hours.  RADIOLOGY: Dg Chest 2 View  04/22/2015  CLINICAL DATA:  Two week history of cough, congestion, weakness and upper extremity swelling. EXAM: CHEST  2 VIEW COMPARISON:  05/21/2009 FINDINGS: The heart is enlarged and somewhat accentuated by the AP projection. Given the shape of the heart could not exclude a pericardial effusion. There is  mild tortuosity of the thoracic aorta. The lungs are clear. No pleural effusion. The bony thorax is grossly intact. IMPRESSION: Cardiac enlargement which represents a change since the prior study. Could not exclude a pericardial effusion. No acute pulmonary findings. Electronically Signed   By: Marijo Sanes M.D.   On: 04/22/2015 18:00   Ct Chest W Contrast  04/23/2015  CLINICAL DATA:  Acute shortness of breath and weight gain EXAM: CT CHEST, ABDOMEN, AND PELVIS WITH CONTRAST TECHNIQUE: Multidetector CT imaging of the chest, abdomen and pelvis was performed following the standard protocol during bolus administration of intravenous contrast. CONTRAST:  145mL OMNIPAQUE IOHEXOL 300 MG/ML  SOLN COMPARISON:  None. FINDINGS: CT CHEST FINDINGS Mediastinum/Nodes: Thoracic aorta is within normal limits. Pulmonary artery as visualized shows no focal abnormality. Cardiac shadow is enlarged. No pericardial effusion is seen. No significant hilar or mediastinal adenopathy is noted. Lungs/Pleura: Moderate  right-sided pleural effusion is noted. Patchy atelectatic changes are seen. No focal confluent infiltrate is noted. Musculoskeletal: No acute bony abnormality is noted. General anasarca in the soft tissues of the chest is noted. CT ABDOMEN PELVIS FINDINGS Hepatobiliary: The liver and gallbladder appear within normal limits. Pancreas: No acute abnormality noted. Spleen: No acute abnormality noted. Adrenals/Urinary Tract: Scattered cystic changes are noted within the left kidney. The adrenal glands appear within normal limits. Bladder is well distended. Stomach/Bowel: Fecal material is noted throughout the colon. Diverticular change is seen without definitive diverticulitis. No free air is noted. The appendix is not well appreciated Vascular/Lymphatic: Vascular structures appear within normal limits. Other: Mild ascites is identified. Generalized changes of anasarca are noted in the abdominal wall and pelvic soft tissues. There is a left inguinal hernia containing ascites. Musculoskeletal: No acute bony abnormality is noted. IMPRESSION: Changes of ascites and anasarca as well as a right-sided pleural effusion. These changes may be related to underlying hepatic failure. Correlation with laboratory values is recommended. Mild atelectatic changes are noted bilaterally. Cystic changes in the left kidney Electronically Signed   By: Inez Catalina M.D.   On: 04/23/2015 19:43   Ct Abdomen Pelvis W Contrast  04/23/2015  CLINICAL DATA:  Acute shortness of breath and weight gain EXAM: CT CHEST, ABDOMEN, AND PELVIS WITH CONTRAST TECHNIQUE: Multidetector CT imaging of the chest, abdomen and pelvis was performed following the standard protocol during bolus administration of intravenous contrast. CONTRAST:  185mL OMNIPAQUE IOHEXOL 300 MG/ML  SOLN COMPARISON:  None. FINDINGS: CT CHEST FINDINGS Mediastinum/Nodes: Thoracic aorta is within normal limits. Pulmonary artery as visualized shows no focal abnormality. Cardiac shadow is  enlarged. No pericardial effusion is seen. No significant hilar or mediastinal adenopathy is noted. Lungs/Pleura: Moderate right-sided pleural effusion is noted. Patchy atelectatic changes are seen. No focal confluent infiltrate is noted. Musculoskeletal: No acute bony abnormality is noted. General anasarca in the soft tissues of the chest is noted. CT ABDOMEN PELVIS FINDINGS Hepatobiliary: The liver and gallbladder appear within normal limits. Pancreas: No acute abnormality noted. Spleen: No acute abnormality noted. Adrenals/Urinary Tract: Scattered cystic changes are noted within the left kidney. The adrenal glands appear within normal limits. Bladder is well distended. Stomach/Bowel: Fecal material is noted throughout the colon. Diverticular change is seen without definitive diverticulitis. No free air is noted. The appendix is not well appreciated Vascular/Lymphatic: Vascular structures appear within normal limits. Other: Mild ascites is identified. Generalized changes of anasarca are noted in the abdominal wall and pelvic soft tissues. There is a left inguinal hernia containing ascites. Musculoskeletal: No acute bony abnormality is  noted. IMPRESSION: Changes of ascites and anasarca as well as a right-sided pleural effusion. These changes may be related to underlying hepatic failure. Correlation with laboratory values is recommended. Mild atelectatic changes are noted bilaterally. Cystic changes in the left kidney Electronically Signed   By: Inez Catalina M.D.   On: 04/23/2015 19:43    PHYSICAL EXAM General: NAD Neck: JVP 10 cm, no thyromegaly or thyroid nodule.  Lungs: Clear to auscultation bilaterally with normal respiratory effort. CV: Lateral PMI.  Heart regular S1/S2, no S3/S4, 2/6 HSM LLSB/apex.  1+ chronic edema to knees bilaterally.   Abdomen: Soft, nontender, no hepatosplenomegaly, mild distention.  Neurologic: Alert and oriented x 3.  Psych: Normal affect. Extremities: No clubbing or  cyanosis. Edematous right arm.   TELEMETRY: Reviewed telemetry pt in NSR at 100  ASSESSMENT AND PLAN: 49 yo with history of nonischemic cardiomyopathy in 2010 with full recovery also in 2010 presented with acute systolic CHF and also was noted to be anemic (Fe deficiency with FOBT+).  He had not seen a doctor or taken any meds for several years prior to admission.  1. Acute systolic CHF: Echo ([id number]) with EF 15%, severe LV dilation with moderate LVH, moderately decreased RV systolic function, moderate-severe MR likely due to annular dilatation.  Prior echo in 2010 had showed normalized systolic function.  Suspect low output with co-ox 54% initially.  He is diuresing, still volume overloaded but improving.  CVP down to 13 this morning.  - Continue milrinone 0.25 mcg/kg/min.  Co-ox 80% today.  - Continue Lasix gtt at 10 mg/hr.  - Hydralazine 25 tid + Imdur 30.   - h/o angioedema with ACEI.  No beta blocker yet with low output.  - Added digoxin 0.125 daily.  - Increase spironolactone to 25 mg daily.   - Replete K. - Will need eventual RHC/LHC once he is diuresed => would try to wean off milrinone tomorrow with possible cath on Wednesday.   - Send SPEP/UPEP, HIV.  2. Anemia: Fe deficient with FOBT+.  Will need EGD and colonoscopy eventually.  He got a unit PRBCs and IV Fe this admission.  Hemoglobin stable. 3. Hypoalbuminemia: So far, not impeding diuresis.   4. ?Cirrhosis: Elevated INR at baseline with ascites and hypoalbuminemia but CT abdomen showed normal liver appearance.  5. Asymmetric right arm swelling: [place] negative for DVT.  Uncertain etiology.  Chest CT did not show a mass or any obstruction to venous/arterior flow from right arm.   Loralie Champagne 04/27/2015 8:04 AM

## 2015-04-28 LAB — CARBOXYHEMOGLOBIN
CARBOXYHEMOGLOBIN: 1.9 % — AB (ref 0.5–1.5)
Carboxyhemoglobin: 1.5 % (ref 0.5–1.5)
Methemoglobin: 0.7 % (ref 0.0–1.5)
Methemoglobin: 0.6 % (ref 0.0–1.5)
O2 SAT: 82.1 %
O2 Saturation: 79.9 %
TOTAL HEMOGLOBIN: 8.8 g/dL — AB (ref 13.5–18.0)
Total hemoglobin: 8.9 g/dL — ABNORMAL LOW (ref 13.5–18.0)

## 2015-04-28 LAB — UIFE/LIGHT CHAINS/TP QN, 24-HR UR
% BETA, Urine: 0 %
ALPHA 1 URINE: 0 %
Albumin, U: 0 %
Alpha 2, Urine: 0 %
FREE KAPPA/LAMBDA RATIO: 1.93 — AB (ref 2.04–10.37)
FREE LAMBDA LT CHAINS, UR: 2.88 mg/L (ref 0.24–6.66)
FREE LT CHN EXCR RATE: 5.56 mg/L (ref 1.35–24.19)
GAMMA GLOBULIN URINE: 0 %
Total Protein, Urine: <4 mg/dL

## 2015-04-28 LAB — CBC
HEMATOCRIT: 30.7 % — AB (ref 39.0–52.0)
Hemoglobin: 8.9 g/dL — ABNORMAL LOW (ref 13.0–17.0)
MCH: 22.5 pg — ABNORMAL LOW (ref 26.0–34.0)
MCHC: 29 g/dL — ABNORMAL LOW (ref 30.0–36.0)
MCV: 77.5 fL — AB (ref 78.0–100.0)
Platelets: 374 10*3/uL (ref 150–400)
RBC: 3.96 MIL/uL — AB (ref 4.22–5.81)
RDW: 21.2 % — AB (ref 11.5–15.5)
WBC: 4.2 10*3/uL (ref 4.0–10.5)

## 2015-04-28 LAB — BASIC METABOLIC PANEL
Anion gap: 7 (ref 5–15)
BUN: 10 mg/dL (ref 6–20)
CALCIUM: 8.6 mg/dL — AB (ref 8.9–10.3)
CHLORIDE: 92 mmol/L — AB (ref 101–111)
CO2: 35 mmol/L — ABNORMAL HIGH (ref 22–32)
CREATININE: 1.16 mg/dL (ref 0.61–1.24)
GFR calc Af Amer: >60 mL/min (ref >60)
GFR calc non Af Amer: >60 mL/min (ref >60)
Glucose, Bld: 89 mg/dL (ref 65–99)
Potassium: 4.1 mmol/L (ref 3.5–5.1)
SODIUM: 134 mmol/L — AB (ref 135–145)

## 2015-04-28 LAB — IMMUNOFIXATION, URINE

## 2015-04-28 LAB — MAGNESIUM: Magnesium: 1.6 mg/dL — ABNORMAL LOW (ref 1.7–2.4)

## 2015-04-28 MED ORDER — FUROSEMIDE 40 MG PO TABS
40.0000 mg | ORAL_TABLET | Freq: Two times a day (BID) | ORAL | Status: DC
  Administered 2015-04-28 – 2015-04-29 (×2): 40 mg via ORAL
  Filled 2015-04-28 (×2): qty 1

## 2015-04-28 MED ORDER — SODIUM CHLORIDE 0.9 % IV SOLN: 250.0000 mL | INTRAVENOUS | Status: DC | PRN

## 2015-04-28 MED ORDER — HYDRALAZINE HCL 25 MG PO TABS
37.5000 mg | ORAL_TABLET | Freq: Three times a day (TID) | ORAL | Status: DC
  Administered 2015-04-28 – 2015-04-30 (×6): 37.5 mg via ORAL
  Filled 2015-04-28 (×6): qty 2

## 2015-04-28 MED ORDER — MAGNESIUM SULFATE 4 GM/100ML IV SOLN
4.0000 g | Freq: Once | INTRAVENOUS | Status: AC
  Administered 2015-04-28: 4 g via INTRAVENOUS
  Filled 2015-04-28: qty 100

## 2015-04-28 MED ORDER — ACETAMINOPHEN 325 MG PO TABS
650.0000 mg | ORAL_TABLET | Freq: Four times a day (QID) | ORAL | Status: DC | PRN
  Administered 2015-04-28 – 2015-04-29 (×2): 650 mg via ORAL
  Filled 2015-04-28 (×2): qty 2

## 2015-04-28 MED ORDER — SODIUM CHLORIDE 0.9 % IV SOLN: INTRAVENOUS | Status: DC

## 2015-04-28 MED ORDER — SODIUM CHLORIDE 0.9 % IJ SOLN: 3.0000 mL | INTRAMUSCULAR | Status: DC | PRN

## 2015-04-28 MED ORDER — MILRINONE IN DEXTROSE 20 MG/100ML IV SOLN
0.1250 ug/kg/min | INTRAVENOUS | Status: DC
Start: 2015-04-28 — End: 2015-04-28
  Administered 2015-04-28: 0.125 ug/kg/min via INTRAVENOUS

## 2015-04-28 MED ORDER — ASPIRIN 81 MG PO CHEW
81.0000 mg | CHEWABLE_TABLET | ORAL | Status: AC
  Administered 2015-04-29: 81 mg via ORAL
  Filled 2015-04-28: qty 1

## 2015-04-28 MED ORDER — ISOSORBIDE MONONITRATE ER 60 MG PO TB24
60.0000 mg | ORAL_TABLET | Freq: Every day | ORAL | Status: DC
  Administered 2015-04-28 – 2015-05-02 (×4): 60 mg via ORAL
  Filled 2015-04-28 (×4): qty 1

## 2015-04-28 MED ORDER — SODIUM CHLORIDE 0.9 % IJ SOLN
3.0000 mL | Freq: Two times a day (BID) | INTRAMUSCULAR | Status: DC
  Administered 2015-04-29 (×2): 3 mL via INTRAVENOUS

## 2015-04-28 NOTE — Progress Notes (Signed)
Milrinone dosage updated per order.  New weight updated on pump. Dosage titrated appropriately according to weight.    Roxan Hockey, RN

## 2015-04-28 NOTE — Progress Notes (Signed)
[facility] PROGRESS NOTE  Kyle Conrad [id number] DOB: 12-13-1965 DOA: 04/22/2015 PCP: No PCP Per Patient  Length of stay: 6   Assessment/Plan: Principal Problem:   Acute congestive heart failure ([place]) Active Problems:   Obstructive sleep apnea   HYPERTENSION, BENIGN   Anemia   Anasarca   Acute on chronic systolic heart failure ([place])    1. Acute CHF:  Last 2-D echo in 10/ 2010, current LVEF 10-15% with restrictive diastolic filling pattern and moderate right ventricular dysfunction. He has moderate to severe mitral regurgitation as well likely related to annular dilatation. PASP 48 mmHg. Brisk diuresis yesterday, out 13L x 24 hrs. Weight shows down 30 lbs.  Continue milrinone per cardiology  Also continue Lasix drip, hydralazine, Imdur, digoxin, Aldactone PICC line placed to measure Co-ox level on 12/10 Cardiology to decide on cardiac cath,possible cath on Wednesday  Hypokalemia and hypomagnesemia Replete  2. HTN:  Hypertensive at admission. Patient had angioedema with lisinopril.  Now on IV Lasix,PO hydralazine, Coreg, cardiology did titrate medications based on 2-D echo   3. Microcytic Anemia and positive fecal occult blood:  Without gross bleeding or melena., But patient endorses dark stools for the last 2-3 weeks Intermittent NSAID use+. Presented with a hemoglobin of 7.9> 8.1 > 8.0>8.3  after one unit of blood, INR 1.86, status post vitamin K 1.84 Continue patient on Protonix currently no active bleeding, discussed with Dr. Watt Climes, postpone EGD and colonoscopy until cleared by cardiology , hopefully will have intervention this week Serum iron 14,  received ferrous gluconate 12/9  4. Hypoalbuminemia:  Patient anasarcic, but urine dipstick negative. Creatinine normal. -Urine protein/creatinine ratio, UA negative for protein Concern for amyloidosis  SPEP/UPEP with hypoalbuminemia. No apparent evidence of monoclonal protein. No M spike  observed   5. Right upper extremity swelling  venous  Doppler negative for DVT  , now has a PICC line   6. Coagulopathy, cirrhosis?  CT of the abdomen and pelvis shows ascites, ammonia level within normal limits  . Patient may need paracentesis. GI to make further recommendations. Acute hepatitis panel negative      DVT prophylaxsis scd's   Code Status:      Code Status Orders        Start     Ordered   04/22/15 2130  Full code   Continuous     04/22/15 2129      Family Communication: Discussed in detail with the patient, all imaging results, lab results explained to the patient   Disposition Plan: As per cardiology recommendations   Brief narrative:  49 y.o. male with a past medical history significant for HTN and NICM with episode of acute CHF in 2010 who presents with several months worsening dyspnea on exertion and weight gain.  The patient had an episode of congestive heart failure around 2010, had clean coronaries on cath with an EF of 20%, was treated heart failure clinic it appears for about one year, his EF returned around 60%, and then was lost to follow-up.  The patient reports to me that he was "all better" since then and so he didn't follow up with heart failure clinic or find a PCP. He was working as an Optometrist, and physically active (biking 30 miles twice a week and going to the gym). Around October of this year he lost his job, and it was in the context that he started to notice worsening shortness of breath.  Since that time, the patient  has had progressive shortness of breath with exertion, increased swelling first in the ankles and legs, belly, arms, and scrotum, return of orthopnea, paroxysmal nocturnal dyspnea (he describes this as "feeling like my chest gets heavy when I lay down, and then it all filters out when I sit back up"). In the last week he has been having apple cider vinegar as a diuretic, but his symptoms progressed so he came to the ER.  In  the ED, the patient was tachycardic, had a BNP 2601 pg per male, and new cardiomegaly on chest x-ray. His ECG showed sinus tachycardia. Of note the patient's hemoglobin was 7.9 g/dL and he was fecal occult blood positive. He denied hematemesis, melena, or frank bleeding from the rectum. He notes taking 1-2 aspirin or naproxen daily for a while given knee pain with his excess water weight.  Consultants:  GI  Cardiology   Procedures:  None   Antibiotics: Anti-infectives    None         HPI/Subjective: Patient had excellent urine output overnight, document 9L, no chest pain or shortness of breath  Objective: Filed Vitals:   04/28/15 0400 04/28/15 0538 04/28/15 0552 04/28/15 0750  BP:  120/80  118/66  Pulse:    101  Temp:    97.8 F (36.6 C)  TempSrc:    Oral  Resp: 14   18  Height:   6' (1.829 m)   Weight:   98.431 kg (217 lb)   SpO2:    97%    Intake/Output Summary (Last 24 hours) at 04/28/15 0955 Last data filed at 04/28/15 0900  Gross per 24 hour  Intake 2333.68 ml  Output  11650 ml  Net -9316.32 ml    Exam:  General: NAD Neck: JVP 14-16 cm, no thyromegaly or thyroid nodule.  Lungs: Clear to auscultation bilaterally with normal respiratory effort. CV: Lateral PMI. Heart regular S1/S2, no S3/S4, 2/6 HSM LLSB/apex. 1+ chronic edema to knees bilaterally.  Abdomen: Soft, nontender, no hepatosplenomegaly, mild distention.  Neurologic: Alert and oriented x 3.  Psych: Normal affect. Extremities: No clubbing or cyanosis. Edematous right arm.   Data Review   Micro Results Recent Results (from the past 240 hour(s))  MRSA PCR Screening     Status: None   Collection Time: 04/25/15  1:51 PM  Result Value Ref Range Status   MRSA by PCR NEGATIVE NEGATIVE Final    Comment:        The GeneXpert MRSA Assay (FDA approved for NASAL specimens only), is one component of a comprehensive MRSA colonization surveillance program. It is not intended to diagnose  MRSA infection nor to guide or monitor treatment for MRSA infections.     Radiology Reports Dg Chest 2 View  04/22/2015  CLINICAL DATA:  Two week history of cough, congestion, weakness and upper extremity swelling. EXAM: CHEST  2 VIEW COMPARISON:  05/21/2009 FINDINGS: The heart is enlarged and somewhat accentuated by the AP projection. Given the shape of the heart could not exclude a pericardial effusion. There is mild tortuosity of the thoracic aorta. The lungs are clear. No pleural effusion. The bony thorax is grossly intact. IMPRESSION: Cardiac enlargement which represents a change since the prior study. Could not exclude a pericardial effusion. No acute pulmonary findings. Electronically Signed   By: Marijo Sanes M.D.   On: 04/22/2015 18:00   Ct Chest W Contrast  04/23/2015  CLINICAL DATA:  Acute shortness of breath and weight gain EXAM: CT CHEST, ABDOMEN, AND PELVIS  WITH CONTRAST TECHNIQUE: Multidetector CT imaging of the chest, abdomen and pelvis was performed following the standard protocol during bolus administration of intravenous contrast. CONTRAST:  122mL OMNIPAQUE IOHEXOL 300 MG/ML  SOLN COMPARISON:  None. FINDINGS: CT CHEST FINDINGS Mediastinum/Nodes: Thoracic aorta is within normal limits. Pulmonary artery as visualized shows no focal abnormality. Cardiac shadow is enlarged. No pericardial effusion is seen. No significant hilar or mediastinal adenopathy is noted. Lungs/Pleura: Moderate right-sided pleural effusion is noted. Patchy atelectatic changes are seen. No focal confluent infiltrate is noted. Musculoskeletal: No acute bony abnormality is noted. General anasarca in the soft tissues of the chest is noted. CT ABDOMEN PELVIS FINDINGS Hepatobiliary: The liver and gallbladder appear within normal limits. Pancreas: No acute abnormality noted. Spleen: No acute abnormality noted. Adrenals/Urinary Tract: Scattered cystic changes are noted within the left kidney. The adrenal glands appear  within normal limits. Bladder is well distended. Stomach/Bowel: Fecal material is noted throughout the colon. Diverticular change is seen without definitive diverticulitis. No free air is noted. The appendix is not well appreciated Vascular/Lymphatic: Vascular structures appear within normal limits. Other: Mild ascites is identified. Generalized changes of anasarca are noted in the abdominal wall and pelvic soft tissues. There is a left inguinal hernia containing ascites. Musculoskeletal: No acute bony abnormality is noted. IMPRESSION: Changes of ascites and anasarca as well as a right-sided pleural effusion. These changes may be related to underlying hepatic failure. Correlation with laboratory values is recommended. Mild atelectatic changes are noted bilaterally. Cystic changes in the left kidney Electronically Signed   By: Inez Catalina M.D.   On: 04/23/2015 19:43   Ct Abdomen Pelvis W Contrast  04/23/2015  CLINICAL DATA:  Acute shortness of breath and weight gain EXAM: CT CHEST, ABDOMEN, AND PELVIS WITH CONTRAST TECHNIQUE: Multidetector CT imaging of the chest, abdomen and pelvis was performed following the standard protocol during bolus administration of intravenous contrast. CONTRAST:  124mL OMNIPAQUE IOHEXOL 300 MG/ML  SOLN COMPARISON:  None. FINDINGS: CT CHEST FINDINGS Mediastinum/Nodes: Thoracic aorta is within normal limits. Pulmonary artery as visualized shows no focal abnormality. Cardiac shadow is enlarged. No pericardial effusion is seen. No significant hilar or mediastinal adenopathy is noted. Lungs/Pleura: Moderate right-sided pleural effusion is noted. Patchy atelectatic changes are seen. No focal confluent infiltrate is noted. Musculoskeletal: No acute bony abnormality is noted. General anasarca in the soft tissues of the chest is noted. CT ABDOMEN PELVIS FINDINGS Hepatobiliary: The liver and gallbladder appear within normal limits. Pancreas: No acute abnormality noted. Spleen: No acute  abnormality noted. Adrenals/Urinary Tract: Scattered cystic changes are noted within the left kidney. The adrenal glands appear within normal limits. Bladder is well distended. Stomach/Bowel: Fecal material is noted throughout the colon. Diverticular change is seen without definitive diverticulitis. No free air is noted. The appendix is not well appreciated Vascular/Lymphatic: Vascular structures appear within normal limits. Other: Mild ascites is identified. Generalized changes of anasarca are noted in the abdominal wall and pelvic soft tissues. There is a left inguinal hernia containing ascites. Musculoskeletal: No acute bony abnormality is noted. IMPRESSION: Changes of ascites and anasarca as well as a right-sided pleural effusion. These changes may be related to underlying hepatic failure. Correlation with laboratory values is recommended. Mild atelectatic changes are noted bilaterally. Cystic changes in the left kidney Electronically Signed   By: Inez Catalina M.D.   On: 04/23/2015 19:43     CBC  Recent Labs Lab 04/22/15 1744 04/24/15 0246 04/25/15 0247 04/26/15 0500 04/27/15 0516 04/28/15 0433  WBC 4.3  5.1 3.5* 4.0 3.9* 4.2  HGB 7.9* 8.1* 8.1* 8.0* 8.3* 8.9*  HCT 27.6* 26.9* 26.9* 26.9* 28.2* 30.7*  PLT 393 362 340 333 321 374  MCV 77.3* 76.6* 77.3* 77.5* 76.6* 77.5*  MCH 22.1* 23.1* 23.3* 23.1* 22.6* 22.5*  MCHC 28.6* 30.1 30.1 29.7* 29.4* 29.0*  RDW 21.7* 20.8* 21.0* 21.0* 21.0* 21.2*  LYMPHSABS 0.5*  --   --   --   --   --   MONOABS 0.6  --   --   --   --   --   EOSABS 0.0  --   --   --   --   --   BASOSABS 0.0  --   --   --   --   --     Chemistries   Recent Labs Lab 04/22/15 1934 04/22/15 2202  04/24/15 0246 04/25/15 0247 04/25/15 1250 04/26/15 0500 04/26/15 0709 04/27/15 0516 04/28/15 0433  NA  --   --   < > 138 139  --  138  --  134* 134*  K  --   --   < > 3.3* 3.8  --  3.3*  --  3.4* 4.1  CL  --   --   < > 107 106  --  103  --  96* 92*  CO2  --   --   < > 26 27   --  30  --  32 35*  GLUCOSE  --   --   < > 100* 89  --  111*  --  169* 89  BUN  --   --   < > 12 10  --  13  --  12 10  CREATININE  --   --   < > 1.18 1.12  --  1.16  --  1.10 1.16  CALCIUM  --   --   < > 8.1* 8.4*  --  8.1*  --  8.2* 8.6*  MG  --  1.7  --   --   --  1.7  --  1.6*  --  1.6*  AST 31  --   --  24 21  --  22  --  21  --   ALT 22  --   --  19 17  --  14*  --  16*  --   ALKPHOS 95  --   --  88 83  --  81  --  87  --   BILITOT 1.7*  --   --  2.3* 2.0*  --  1.1  --  1.7*  --   < > = values in this interval not displayed. ------------------------------------------------------------------------------------------------------------------ estimated creatinine clearance is 91.5 mL/min (by C-G formula based on Cr of 1.16). ------------------------------------------------------------------------------------------------------------------ No results for input(s): HGBA1C in the last 72 hours. ------------------------------------------------------------------------------------------------------------------ No results for input(s): CHOL, HDL, LDLCALC, TRIG, CHOLHDL, LDLDIRECT in the last 72 hours. ------------------------------------------------------------------------------------------------------------------ No results for input(s): TSH, T4TOTAL, T3FREE, THYROIDAB in the last 72 hours.  Invalid input(s): FREET3 ------------------------------------------------------------------------------------------------------------------ No results for input(s): VITAMINB12, FOLATE, FERRITIN, TIBC, IRON, RETICCTPCT in the last 72 hours.  Coagulation profile  Recent Labs Lab 04/23/15 0355 04/25/15 0247  INR 1.86* 1.84*    No results for input(s): DDIMER in the last 72 hours.  Cardiac Enzymes No results for input(s): CKMB, TROPONINI, MYOGLOBIN in the last 168 hours.  Invalid input(s):  CK ------------------------------------------------------------------------------------------------------------------ Invalid input(s): POCBNP   CBG: No results for input(s): GLUCAP in the last 168  hours.     Studies: No results found.    Lab Results  Component Value Date   HGBA1C  09/10/2008    6.0 (NOTE) The ADA recommends the following therapeutic goal for glycemic control related to Hgb A1c measurement: Goal of therapy: <6.5 Hgb A1c  Reference: American Diabetes Association: Clinical Practice Recommendations 2010, Diabetes Care, 2010, 33: (Suppl  1).   Lab Results  Component Value Date   [facility]  09/08/2008    96        Total Cholesterol/HDL:CHD Risk Coronary Heart Disease Risk Table                     Men   Women  1/2 Average Risk   3.4   3.3  Average Risk       5.0   4.4  2 X Average Risk   9.6   7.1  3 X Average Risk  23.4   11.0        Use the calculated Patient Ratio above and the CHD Risk Table to determine the patient's CHD Risk.        ATP III CLASSIFICATION (LDL):  <100     mg/dL   Optimal  100-129  mg/dL   Near or Above                    Optimal  130-159  mg/dL   Borderline  160-189  mg/dL   High  >190     mg/dL   Very High   CREATININE 1.16 04/28/2015       Scheduled Meds: . digoxin  0.125 mg Oral Daily  . furosemide  40 mg Oral BID  . hydrALAZINE  37.5 mg Oral 3 times per day  . isosorbide mononitrate  60 mg Oral Daily  . magnesium oxide  400 mg Oral BID  . magnesium sulfate 1 - 4 g bolus IVPB  4 g Intravenous Once  . pantoprazole (PROTONIX) IV  40 mg Intravenous Q12H  . potassium chloride  60 mEq Oral BID  . sodium chloride  10-40 mL Intracatheter Q12H  . spironolactone  25 mg Oral Daily   Continuous Infusions: . sodium chloride 10 mL/hr at 04/24/15 1118  . milrinone 0.125 mcg/kg/min (04/28/15 0817)    Principal Problem:   Acute congestive heart failure ([facility]) Active Problems:   Obstructive sleep apnea   HYPERTENSION, BENIGN    Anemia   Anasarca   Acute on chronic systolic heart failure ([place])    Time spent: 45 minutes   [facility] Pager [phone]. If 7PM-7AM, please contact night-coverage at [email or web address], password [facility] 04/28/2015, 9:55 AM  LOS: 6 days

## 2015-04-28 NOTE — Progress Notes (Signed)
Patient ID: Kyle Conrad, male   DOB: [birth year], 49 y.o.   MRN: [id number]   SUBJECTIVE: Feels ok this morning. Co-ox has been stable in 70-80s on 0.25 milrinone. Brisk diuresis yesterday, out 13L x 24 hrs.  Weight shows down 30 lbs. CVP 20 => 13 => 8  Scheduled Meds: . digoxin  0.125 mg Oral Daily  . hydrALAZINE  25 mg Oral 3 times per day  . isosorbide mononitrate  30 mg Oral Daily  . magnesium oxide  400 mg Oral BID  . pantoprazole (PROTONIX) IV  40 mg Intravenous Q12H  . potassium chloride  60 mEq Oral BID  . sodium chloride  10-40 mL Intracatheter Q12H  . spironolactone  25 mg Oral Daily   Continuous Infusions: . sodium chloride 10 mL/hr at 04/24/15 1118  . furosemide (LASIX) infusion 10 mg/hr (04/27/15 1015)  . milrinone 0.25 mcg/kg/min (04/27/15 2100)   PRN Meds:.sodium chloride    Filed Vitals:   04/28/15 0322 04/28/15 0400 04/28/15 0538 04/28/15 0552  BP: 114/77  120/80   Pulse:      Temp: 97.6 F (36.4 C)     TempSrc: Oral     Resp: 21 14    Height:    6' (1.829 m)  Weight:    217 lb (98.431 kg)  SpO2: 98%       Intake/Output Summary (Last 24 hours) at 04/28/15 0737 Last data filed at 04/28/15 0700  Gross per 24 hour  Intake 2345.6 ml  Output  11250 ml  Net -8904.4 ml    LABS: Basic Metabolic Panel:  Recent Labs  04/26/15 0709 04/27/15 0516 04/28/15 0433  NA  --  134* 134*  K  --  3.4* 4.1  CL  --  96* 92*  CO2  --  32 35*  GLUCOSE  --  169* 89  BUN  --  12 10  CREATININE  --  1.10 1.16  CALCIUM  --  8.2* 8.6*  MG 1.6*  --  1.6*   Liver Function Tests:  Recent Labs  04/26/15 0500 04/27/15 0516  AST 22 21  ALT 14* 16*  ALKPHOS 81 87  BILITOT 1.1 1.7*  PROT 5.6* 5.9*  ALBUMIN 1.8* 1.9*   No results for input(s): LIPASE, AMYLASE in the last 72 hours. CBC:  Recent Labs  04/27/15 0516 04/28/15 0433  WBC 3.9* 4.2  HGB 8.3* 8.9*  HCT 28.2* 30.7*  MCV 76.6* 77.5*  PLT 321 374   Cardiac Enzymes: No results for input(s):  CKTOTAL, CKMB, CKMBINDEX, TROPONINI in the last 72 hours. BNP: Invalid input(s): POCBNP D-Dimer: No results for input(s): DDIMER in the last 72 hours. Hemoglobin A1C: No results for input(s): HGBA1C in the last 72 hours. Fasting Lipid Panel: No results for input(s): CHOL, HDL, LDLCALC, TRIG, CHOLHDL, LDLDIRECT in the last 72 hours. Thyroid Function Tests: No results for input(s): TSH, T4TOTAL, T3FREE, THYROIDAB in the last 72 hours.  Invalid input(s): FREET3 Anemia Panel: No results for input(s): VITAMINB12, FOLATE, FERRITIN, TIBC, IRON, RETICCTPCT in the last 72 hours.  RADIOLOGY: Dg Chest 2 View  04/22/2015  CLINICAL DATA:  Two week history of cough, congestion, weakness and upper extremity swelling. EXAM: CHEST  2 VIEW COMPARISON:  05/21/2009 FINDINGS: The heart is enlarged and somewhat accentuated by the AP projection. Given the shape of the heart could not exclude a pericardial effusion. There is mild tortuosity of the thoracic aorta. The lungs are clear. No pleural effusion. The bony thorax is grossly intact.  IMPRESSION: Cardiac enlargement which represents a change since the prior study. Could not exclude a pericardial effusion. No acute pulmonary findings. Electronically Signed   By: Marijo Sanes M.D.   On: 04/22/2015 18:00   Ct Chest W Contrast  04/23/2015  CLINICAL DATA:  Acute shortness of breath and weight gain EXAM: CT CHEST, ABDOMEN, AND PELVIS WITH CONTRAST TECHNIQUE: Multidetector CT imaging of the chest, abdomen and pelvis was performed following the standard protocol during bolus administration of intravenous contrast. CONTRAST:  156mL OMNIPAQUE IOHEXOL 300 MG/ML  SOLN COMPARISON:  None. FINDINGS: CT CHEST FINDINGS Mediastinum/Nodes: Thoracic aorta is within normal limits. Pulmonary artery as visualized shows no focal abnormality. Cardiac shadow is enlarged. No pericardial effusion is seen. No significant hilar or mediastinal adenopathy is noted. Lungs/Pleura: Moderate  right-sided pleural effusion is noted. Patchy atelectatic changes are seen. No focal confluent infiltrate is noted. Musculoskeletal: No acute bony abnormality is noted. General anasarca in the soft tissues of the chest is noted. CT ABDOMEN PELVIS FINDINGS Hepatobiliary: The liver and gallbladder appear within normal limits. Pancreas: No acute abnormality noted. Spleen: No acute abnormality noted. Adrenals/Urinary Tract: Scattered cystic changes are noted within the left kidney. The adrenal glands appear within normal limits. Bladder is well distended. Stomach/Bowel: Fecal material is noted throughout the colon. Diverticular change is seen without definitive diverticulitis. No free air is noted. The appendix is not well appreciated Vascular/Lymphatic: Vascular structures appear within normal limits. Other: Mild ascites is identified. Generalized changes of anasarca are noted in the abdominal wall and pelvic soft tissues. There is a left inguinal hernia containing ascites. Musculoskeletal: No acute bony abnormality is noted. IMPRESSION: Changes of ascites and anasarca as well as a right-sided pleural effusion. These changes may be related to underlying hepatic failure. Correlation with laboratory values is recommended. Mild atelectatic changes are noted bilaterally. Cystic changes in the left kidney Electronically Signed   By: Inez Catalina M.D.   On: 04/23/2015 19:43   Ct Abdomen Pelvis W Contrast  04/23/2015  CLINICAL DATA:  Acute shortness of breath and weight gain EXAM: CT CHEST, ABDOMEN, AND PELVIS WITH CONTRAST TECHNIQUE: Multidetector CT imaging of the chest, abdomen and pelvis was performed following the standard protocol during bolus administration of intravenous contrast. CONTRAST:  129mL OMNIPAQUE IOHEXOL 300 MG/ML  SOLN COMPARISON:  None. FINDINGS: CT CHEST FINDINGS Mediastinum/Nodes: Thoracic aorta is within normal limits. Pulmonary artery as visualized shows no focal abnormality. Cardiac shadow is  enlarged. No pericardial effusion is seen. No significant hilar or mediastinal adenopathy is noted. Lungs/Pleura: Moderate right-sided pleural effusion is noted. Patchy atelectatic changes are seen. No focal confluent infiltrate is noted. Musculoskeletal: No acute bony abnormality is noted. General anasarca in the soft tissues of the chest is noted. CT ABDOMEN PELVIS FINDINGS Hepatobiliary: The liver and gallbladder appear within normal limits. Pancreas: No acute abnormality noted. Spleen: No acute abnormality noted. Adrenals/Urinary Tract: Scattered cystic changes are noted within the left kidney. The adrenal glands appear within normal limits. Bladder is well distended. Stomach/Bowel: Fecal material is noted throughout the colon. Diverticular change is seen without definitive diverticulitis. No free air is noted. The appendix is not well appreciated Vascular/Lymphatic: Vascular structures appear within normal limits. Other: Mild ascites is identified. Generalized changes of anasarca are noted in the abdominal wall and pelvic soft tissues. There is a left inguinal hernia containing ascites. Musculoskeletal: No acute bony abnormality is noted. IMPRESSION: Changes of ascites and anasarca as well as a right-sided pleural effusion. These changes may be related  to underlying hepatic failure. Correlation with laboratory values is recommended. Mild atelectatic changes are noted bilaterally. Cystic changes in the left kidney Electronically Signed   By: Inez Catalina M.D.   On: 04/23/2015 19:43    PHYSICAL EXAM General: NAD Neck: JVP 8-9 cm, no thyromegaly or nodule noted. Lungs: CTAB, normal effort CV: Lateral PMI.  Heart regular S1/S2, no S3/S4, 2/6 HSM LLSB/apex.  1+ woody, chronic edema to knees bilaterally.   Abdomen: Soft, NT, no HSM appreciated, mild distention. +BS Neurologic: Alert and oriented x 3.  Psych: Flat affect. Extremities: No clubbing or cyanosis. Edematous right arm.   TELEMETRY: Reviewed  telemetry pt in NSR at 100  ASSESSMENT AND PLAN: 49 yo with history of nonischemic cardiomyopathy in 2010 with full recovery also in 2010 presented with acute systolic CHF and also was noted to be anemic (Fe deficiency with FOBT+).  He had not seen a doctor or taken any meds for several years prior to admission.   1. Acute systolic CHF: Echo ([id number]) with EF 15%, severe LV dilation with moderate LVH, moderately decreased RV systolic function, moderate-severe MR likely due to annular dilatation.  Prior echo in 2010 had showed normalized systolic function.  Likely low output with co-ox 54% initially.   - Remains slightly volume overloaded but much improved.  CVP 8 - Decrease milrinone to 0.125 mcg/kg/min and recheck coox this afternoon.  If remains stable can wean off for possible cath tomorrow. Co-ox 79.9% this am.  - Continue Lasix gtt at 10 mg/hr for now. Creatinine stable. - Continue Hydralazine 25 tid + Imdur 30.   - h/o angioedema with ACEI.  No beta blocker yet with low output.  - Continue digoxin 0.125 daily.  - Continue spironolactone 25 mg daily.   - Supp K as needed for goal K >4. Will supp Mg. - Will need RHC/LHC once diuresed => Will try milrinone wean today for possible cath tomorrow.   - SPEP/UPEP with hypoalbuminemia. No apparent evidence of monoclonal protein. No M spike observed. - Hepatitis screen negative. 2. Anemia: Fe deficient with FOBT+.   - Hgb stable s/p 1 UPRBC 04/23/15. Also received IV Fe this admission.   - Will need EGD and colonoscopy eventually.   3. Hypoalbuminemia:  - Has not impeded diuresis. 4. ?Cirrhosis:  - CT abdomen showed normal liver appearance.  -  Elevated INR at baseline with ascites and hypoalbuminemia 5. Asymmetric right arm swelling:  - Unclear etiology. - [place] negative for DVT.     - Chest CT did not show a mass or any obstruction to venous/arterior flow from right arm.   Shirley Friar PA-C 04/28/2015 7:37 AM   [facility]  Failure Team Pager [phone] (M-F; 7a - 4p)  Please contact [facility] for night-coverage after hours (4p -7a ) and weekends on [email or web address]  Patient seen with PA, agree with the above note.  He is doing well today.  Weight is down a lot ?70 lbs total.  CVP 8 today with good co-ox.  - Stop IV Lasix today, start Lasix 40 mg po bid, 1st dose this afternoon. - Decrease milrinone to 0.125, repeat co-ox this afternoon and stop milrinone if ok. - Increase hydralazine to 37.5 mg tid and Imdur to 60 mg daily.  - Plan LHC/RHC tomorrow.   Loralie Champagne 04/28/2015 8:20 AM

## 2015-04-28 NOTE — Progress Notes (Addendum)
Physical Therapy Treatment Patient Details Name: Kyle Conrad MRN: [id number] DOB: 09-Jun-1965 Today's Date: 04/28/2015    History of Present Illness pt is a 49 y/o male with h/o CHF, NICM, HTN, admitted with worsening dyspnea on exertion and weight gain.    PT Comments    Progressing steadily.  Emphasis on exercise and progressive ambulation.   Follow Up Recommendations  SNF (pt wishes to go straight home.)     Equipment Recommendations  Other (comment) (TBA)    Recommendations for Other Services       Precautions / Restrictions Precautions Precautions: Fall    Mobility  Bed Mobility               General bed mobility comments: up in chair on arrival  Transfers Overall transfer level: Needs assistance Equipment used: Rolling walker (2 wheeled) Transfers: Sit to/from Stand Sit to Stand: Min assist         General transfer comment: very minimal steady assist, cues for hand placement  Ambulation/Gait   Ambulation Distance (Feet): 600 Feet Assistive device: Rolling walker (2 wheeled) Gait Pattern/deviations: Step-through pattern     General Gait Details: More narrowed BOS now that he has lost 30 pounds of fluid.  Pt able to appreciably increase gait speed and sustain it without significant drop in SpO2  EHR in the 110's/120's bpm.   Stairs            Wheelchair Mobility    Modified Rankin (Stroke Patients Only)       Balance     Sitting balance-[name]: Good       Standing balance-[name]: Fair                      Cognition Arousal/Alertness: Awake/alert Behavior During Therapy: WFL for tasks assessed/performed Overall Cognitive Status: Within Functional Limits for tasks assessed                      Exercises General Exercises - Lower Extremity Long Arc Quad: AROM;Both;15 reps;Seated Hip Flexion/Marching: AROM;AAROM;Both;10 reps;Seated (graded resistance) Toe Raises: AROM;Both;Seated;15 reps Heel  Raises: AROM;Both;15 reps;Seated Other Exercises Other Exercises: bicep/tricep presses x10 reps.    General Comments        Pertinent Vitals/Pain Pain Assessment: No/denies pain    Home Living                      Prior Function            PT Goals (current goals can now be found in the care plan section) Acute Rehab PT Goals Patient Stated Goal: get this weight off, independent, back to work and working out PT Goal Formulation: With patient Time For Goal Achievement: 05/07/15 Progress towards PT goals: Progressing toward goals    Frequency  Min 3X/week    PT Plan Current plan remains appropriate    Co-evaluation             End of Session   Activity Tolerance: Patient tolerated treatment well Patient left: in chair;with call bell/phone within reach     Time: 1337-1401 PT Time Calculation (min) (ACUTE ONLY): 24 min  Charges:  $Gait Training: 8-22 mins $Therapeutic Exercise: 8-22 mins                    G Codes:      Demarrion Meiklejohn, Tessie Fass 04/28/2015, 2:14 PM 04/28/2015  Donnella Sham, PT [phone]  (pager)

## 2015-04-28 NOTE — Progress Notes (Signed)
Ambulated with patient in the hallway. Patient able to make 2 laps around entire unit, using rolling walker for support, gait steady with walker.  Patient denied any dizziness, SOB, or weakness. HR in 110's-120's during ambulation.  Patient feels that he is still not back to baseline, but is progressing. Patient informed nurse that he lives alone at home, and does his own errands. Does not have anyone that could come help. Patient educated on SNF and what it involves. Patient states he may think about going to SNF at discharge.    Will continue to encourage activity and mobility.    Elmarie Shiley R

## 2015-04-29 LAB — CBC
HEMATOCRIT: 29.3 % — AB (ref 39.0–52.0)
HEMOGLOBIN: 8.7 g/dL — AB (ref 13.0–17.0)
MCH: 23.1 pg — ABNORMAL LOW (ref 26.0–34.0)
MCHC: 29.7 g/dL — AB (ref 30.0–36.0)
MCV: 77.7 fL — ABNORMAL LOW (ref 78.0–100.0)
Platelets: 347 10*3/uL (ref 150–400)
RBC: 3.77 MIL/uL — AB (ref 4.22–5.81)
RDW: 21.4 % — ABNORMAL HIGH (ref 11.5–15.5)
WBC: 4.5 10*3/uL (ref 4.0–10.5)

## 2015-04-29 LAB — BASIC METABOLIC PANEL
Anion gap: 6 (ref 5–15)
BUN: 10 mg/dL (ref 6–20)
CO2: 32 mmol/L (ref 22–32)
Calcium: 8.4 mg/dL — ABNORMAL LOW (ref 8.9–10.3)
Chloride: 96 mmol/L — ABNORMAL LOW (ref 101–111)
Creatinine, Ser: 1.1 mg/dL (ref 0.61–1.24)
GFR calc Af Amer: >60 mL/min (ref >60)
GLUCOSE: 80 mg/dL (ref 65–99)
POTASSIUM: 4.9 mmol/L (ref 3.5–5.1)
Sodium: 134 mmol/L — ABNORMAL LOW (ref 135–145)

## 2015-04-29 LAB — CARBOXYHEMOGLOBIN
Carboxyhemoglobin: 1.2 % (ref 0.5–1.5)
METHEMOGLOBIN: 0.9 % (ref 0.0–1.5)
O2 Saturation: 69.5 %
Total hemoglobin: 10.3 g/dL — ABNORMAL LOW (ref 13.5–18.0)

## 2015-04-29 LAB — PROTIME-INR
INR: 1.55 — AB (ref 0.00–1.49)
Prothrombin Time: 18.7 seconds — ABNORMAL HIGH (ref 11.6–15.2)

## 2015-04-29 MED ORDER — POTASSIUM CHLORIDE CRYS ER 20 MEQ PO TBCR
20.0000 meq | EXTENDED_RELEASE_TABLET | Freq: Two times a day (BID) | ORAL | Status: DC
  Administered 2015-04-29 (×2): 20 meq via ORAL
  Filled 2015-04-29 (×2): qty 1

## 2015-04-29 MED ORDER — PANTOPRAZOLE SODIUM 40 MG PO TBEC
40.0000 mg | DELAYED_RELEASE_TABLET | Freq: Two times a day (BID) | ORAL | Status: DC
  Administered 2015-04-29 – 2015-04-30 (×2): 40 mg via ORAL
  Filled 2015-04-29 (×3): qty 1

## 2015-04-29 NOTE — Clinical Social Work Note (Signed)
CSW received consult for possible SNF placement.  Patient is from home alone with no payer source.  Patient is ambulating with PT 600 feet.  No skillable need identified at this time.  RNCM consulted and agreeable to assist with dispositioning.      Nonnie Done, LCSW [phone]  [place] [id number]  Licensed Clinical Social Worker

## 2015-04-29 NOTE — Progress Notes (Addendum)
Patient ID: Kyle Conrad, male   DOB: Sep 21, 1965, 49 y.o.   MRN: [id number]   SUBJECTIVE: Denies SOB or CP. Has been a little lightheaded.  Worse yesterday with increase in HTN meds. Co-ox stable off milrinone. Continues to diurese briskly. Out 6.7 L x 24 hrs with 40 mg of po lasix yesterday (with lasix drip 10 ml running from 0000-~0800 04/28/15)  Weight shows down another 11 lbs. He is down ~81 lbs overall. CVP 20 => 13 => 8 => 6-7  Scheduled Meds: . aspirin  81 mg Oral Pre-Cath  . digoxin  0.125 mg Oral Daily  . furosemide  40 mg Oral BID  . hydrALAZINE  37.5 mg Oral 3 times per day  . isosorbide mononitrate  60 mg Oral Daily  . magnesium oxide  400 mg Oral BID  . pantoprazole (PROTONIX) IV  40 mg Intravenous Q12H  . potassium chloride  60 mEq Oral BID  . sodium chloride  10-40 mL Intracatheter Q12H  . sodium chloride  3 mL Intravenous Q12H  . spironolactone  25 mg Oral Daily   Continuous Infusions: . sodium chloride 10 mL/hr at 04/24/15 1118  . sodium chloride     PRN Meds:.sodium chloride, acetaminophen, sodium chloride, sodium chloride    Filed Vitals:   04/28/15 1916 04/28/15 2351 04/29/15 0402 04/29/15 0637  BP: 95/63 108/69 114/82   Pulse:      Temp: 97.5 F (36.4 C) 97.8 F (36.6 C) 97.6 F (36.4 C)   TempSrc: Oral Oral Oral   Resp: 20 20 18    Height:      Weight:    206 lb 8 oz (93.668 kg)  SpO2: 96% 100% 97%     Intake/Output Summary (Last 24 hours) at 04/29/15 0722 Last data filed at 04/29/15 0700  Gross per 24 hour  Intake 1336.3 ml  Output   4375 ml  Net -3038.7 ml    LABS: Basic Metabolic Panel:  Recent Labs  04/28/15 0433 04/29/15 0430  NA 134* 134*  K 4.1 4.9  CL 92* 96*  CO2 35* 32  GLUCOSE 89 80  BUN 10 10  CREATININE 1.16 1.10  CALCIUM 8.6* 8.4*  MG 1.6*  --    Liver Function Tests:  Recent Labs  04/27/15 0516  AST 21  ALT 16*  ALKPHOS 87  BILITOT 1.7*  PROT 5.9*  ALBUMIN 1.9*   No results for input(s): LIPASE,  AMYLASE in the last 72 hours. CBC:  Recent Labs  04/28/15 0433 04/29/15 0430  WBC 4.2 4.5  HGB 8.9* 8.7*  HCT 30.7* 29.3*  MCV 77.5* 77.7*  PLT 374 347   Cardiac Enzymes: No results for input(s): CKTOTAL, CKMB, CKMBINDEX, TROPONINI in the last 72 hours. BNP: Invalid input(s): POCBNP D-Dimer: No results for input(s): DDIMER in the last 72 hours. Hemoglobin A1C: No results for input(s): HGBA1C in the last 72 hours. Fasting Lipid Panel: No results for input(s): CHOL, HDL, LDLCALC, TRIG, CHOLHDL, LDLDIRECT in the last 72 hours. Thyroid Function Tests: No results for input(s): TSH, T4TOTAL, T3FREE, THYROIDAB in the last 72 hours.  Invalid input(s): FREET3 Anemia Panel: No results for input(s): VITAMINB12, FOLATE, FERRITIN, TIBC, IRON, RETICCTPCT in the last 72 hours.  RADIOLOGY: Dg Chest 2 View  04/22/2015  CLINICAL DATA:  Two week history of cough, congestion, weakness and upper extremity swelling. EXAM: CHEST  2 VIEW COMPARISON:  05/21/2009 FINDINGS: The heart is enlarged and somewhat accentuated by the AP projection. Given the shape of the heart  could not exclude a pericardial effusion. There is mild tortuosity of the thoracic aorta. The lungs are clear. No pleural effusion. The bony thorax is grossly intact. IMPRESSION: Cardiac enlargement which represents a change since the prior study. Could not exclude a pericardial effusion. No acute pulmonary findings. Electronically Signed   By: Marijo Sanes M.D.   On: 04/22/2015 18:00   Ct Chest W Contrast  04/23/2015  CLINICAL DATA:  Acute shortness of breath and weight gain EXAM: CT CHEST, ABDOMEN, AND PELVIS WITH CONTRAST TECHNIQUE: Multidetector CT imaging of the chest, abdomen and pelvis was performed following the standard protocol during bolus administration of intravenous contrast. CONTRAST:  116mL OMNIPAQUE IOHEXOL 300 MG/ML  SOLN COMPARISON:  None. FINDINGS: CT CHEST FINDINGS Mediastinum/Nodes: Thoracic aorta is within normal  limits. Pulmonary artery as visualized shows no focal abnormality. Cardiac shadow is enlarged. No pericardial effusion is seen. No significant hilar or mediastinal adenopathy is noted. Lungs/Pleura: Moderate right-sided pleural effusion is noted. Patchy atelectatic changes are seen. No focal confluent infiltrate is noted. Musculoskeletal: No acute bony abnormality is noted. General anasarca in the soft tissues of the chest is noted. CT ABDOMEN PELVIS FINDINGS Hepatobiliary: The liver and gallbladder appear within normal limits. Pancreas: No acute abnormality noted. Spleen: No acute abnormality noted. Adrenals/Urinary Tract: Scattered cystic changes are noted within the left kidney. The adrenal glands appear within normal limits. Bladder is well distended. Stomach/Bowel: Fecal material is noted throughout the colon. Diverticular change is seen without definitive diverticulitis. No free air is noted. The appendix is not well appreciated Vascular/Lymphatic: Vascular structures appear within normal limits. Other: Mild ascites is identified. Generalized changes of anasarca are noted in the abdominal wall and pelvic soft tissues. There is a left inguinal hernia containing ascites. Musculoskeletal: No acute bony abnormality is noted. IMPRESSION: Changes of ascites and anasarca as well as a right-sided pleural effusion. These changes may be related to underlying hepatic failure. Correlation with laboratory values is recommended. Mild atelectatic changes are noted bilaterally. Cystic changes in the left kidney Electronically Signed   By: Inez Catalina M.D.   On: 04/23/2015 19:43   Ct Abdomen Pelvis W Contrast  04/23/2015  CLINICAL DATA:  Acute shortness of breath and weight gain EXAM: CT CHEST, ABDOMEN, AND PELVIS WITH CONTRAST TECHNIQUE: Multidetector CT imaging of the chest, abdomen and pelvis was performed following the standard protocol during bolus administration of intravenous contrast. CONTRAST:  130mL OMNIPAQUE  IOHEXOL 300 MG/ML  SOLN COMPARISON:  None. FINDINGS: CT CHEST FINDINGS Mediastinum/Nodes: Thoracic aorta is within normal limits. Pulmonary artery as visualized shows no focal abnormality. Cardiac shadow is enlarged. No pericardial effusion is seen. No significant hilar or mediastinal adenopathy is noted. Lungs/Pleura: Moderate right-sided pleural effusion is noted. Patchy atelectatic changes are seen. No focal confluent infiltrate is noted. Musculoskeletal: No acute bony abnormality is noted. General anasarca in the soft tissues of the chest is noted. CT ABDOMEN PELVIS FINDINGS Hepatobiliary: The liver and gallbladder appear within normal limits. Pancreas: No acute abnormality noted. Spleen: No acute abnormality noted. Adrenals/Urinary Tract: Scattered cystic changes are noted within the left kidney. The adrenal glands appear within normal limits. Bladder is well distended. Stomach/Bowel: Fecal material is noted throughout the colon. Diverticular change is seen without definitive diverticulitis. No free air is noted. The appendix is not well appreciated Vascular/Lymphatic: Vascular structures appear within normal limits. Other: Mild ascites is identified. Generalized changes of anasarca are noted in the abdominal wall and pelvic soft tissues. There is a left inguinal hernia  containing ascites. Musculoskeletal: No acute bony abnormality is noted. IMPRESSION: Changes of ascites and anasarca as well as a right-sided pleural effusion. These changes may be related to underlying hepatic failure. Correlation with laboratory values is recommended. Mild atelectatic changes are noted bilaterally. Cystic changes in the left kidney Electronically Signed   By: Inez Catalina M.D.   On: 04/23/2015 19:43    PHYSICAL EXAM General: NAD Neck: JVP 6-7 cm, no thyromegaly or lymphadenopathy noted. Lungs: CTAB, normal effort CV: Lateral PMI.  Heart regular S1/S2, no S3/S4, 2/6 HSM LLSB/apex.  Trace edema.   Abdomen: Soft, NT, ND,  no HSM. No bruits or masses. +BS  Neurologic: Alert and oriented x 3.  Psych: Flat affect. Extremities: No clubbing or cyanosis. Edematous right arm but improved.   TELEMETRY: NSR 100s  ASSESSMENT AND PLAN: 49 yo with history of nonischemic cardiomyopathy in 2010 with full recovery also in 2010 presented with acute systolic CHF and also was noted to be anemic (Fe deficiency with FOBT+).  He had not seen a doctor or taken any meds for several years prior to admission.   1. Acute systolic CHF: Echo ([id number]) with EF 15%, severe LV dilation with moderate LVH, moderately decreased RV systolic function, moderate-severe MR likely due to annular dilatation.  Prior echo in 2010 had showed normalized systolic function.  Likely low output with co-ox 54% initially.   - Remains slightly volume overloaded but much improved.  CVP 8 - Co-ox 69.5 this am off milrinone. Scheduled for R/LHC this afternoon. - Continue Lasix 40 mg po. Creatinine stable. - Continue Hydralazine, Imdur, digoxin, and spiro at current doses.  Hydralazine and Imdur increased 04/28/15. No increase with dizziness and soft BP today.  - h/o angioedema with ACEI.  No BB with low output.  - Decrease K supp with large decrease in diuretics. Received IV Mg 04/28/15. Will recheck Mg level 04/30/15 - SPEP/UPEP with hypoalbuminemia. No apparent evidence of monoclonal protein. No M spike observed. - Hepatitis screen negative. 2. Anemia: Fe deficient with FOBT+.   - Hgb stable s/p 1 UPRBC 04/23/15. Hgb 8.4 this am.  - Received IV Fe this admission.   - Will need EGD and colonoscopy once stable from cardiac standpoint.   3. Hypoalbuminemia:  - Stable. Has not interfered with diuresis. 4. ?Cirrhosis:  - Liver appeared normal on CT abdomen 04/23/15 - Elevated INR at baseline with ascites and hypoalbuminemia 5. Asymmetric right arm swelling:  - [place] negative for DVT.     - Chest CT did not show a mass or any obstruction to venous/arterior flow from  right arm.  - Stable. Unclear etiology.  Satira Mccallum Tillery PA-C 04/29/2015 7:22 AM   Advanced Heart Failure Team Pager [phone] (M-F; 7a - 4p)  Please contact [facility] for night-coverage after hours (4p -7a ) and weekends on [email or web address]   Patient seen with PA, agree with the above note. He has had a marked diuresis (81 lbs), creatinine stable. Co-ox 69.6% off milrinone this morning, CVP 6-7.  He is now on po Lasix.  SBP soft, 90s-110s.  Will not titrate meds today.  - LHC/RHC today.  - From my standpoint, he is stable for GI procedures, may be best to do in inpatient setting.   Loralie Champagne 04/29/2015 8:00 AM

## 2015-04-29 NOTE — Progress Notes (Signed)
[facility] PROGRESS NOTE  Leonell Volz [id number] DOB: [birth year] DOA: 04/22/2015 PCP: No PCP Per Patient  Length of stay: 7   Assessment/Plan: Principal Problem:   Acute congestive heart failure ([place]) Active Problems:   Obstructive sleep apnea   HYPERTENSION, BENIGN   Anemia   Anasarca   Acute on chronic systolic heart failure ([place])    1. Acute CHF:  Last 2-D echo in 10/ 2010, current LVEF 10-15% with restrictive diastolic filling pattern and moderate right ventricular dysfunction. He has moderate to severe mitral regurgitation as well likely related to annular dilatation. PASP 48 mmHg. Out 6.7 L x 24 hrs . Lost another 11 pounds, CVP 6-7  Continue diuresis per cardiology, Switch Lasix to 40 mg PO BID, hydralazine, Imdur, digoxin, Aldactone PICC line placed to measure Co-ox level on 12/10 Cardiac cath today,  Hypokalemia and hypomagnesemia Replete  2. HTN:  Hypertensive at admission. Patient had angioedema with lisinopril.  Now on IV Lasix,PO hydralazine, Coreg, cardiology did titrate medications based on 2-D echo   3. Microcytic Anemia and positive fecal occult blood:  Without gross bleeding or melena., But patient endorses dark stools for the last 2-3 weeks Intermittent NSAID use+. Presented with a hemoglobin of 7.9> 8.1 > 8.0>8.3  after one unit of blood, INR 1.86, status post vitamin K 1.84 Continue patient on Protonix currently no active bleeding, discussed with Dr. Magod/Dr. schooler, updated them about proceeding with EGD and colonoscopy as per cardiology recommendations, hopefully will have intervention this week Serum iron 14,  received ferrous gluconate 12/9  4. Hypoalbuminemia:  Patient anasarcic, but urine dipstick negative. Creatinine normal. -Urine protein/creatinine ratio, UA negative for protein Concern for amyloidosis  SPEP/UPEP with hypoalbuminemia. No apparent evidence of monoclonal protein. No M spike observed   5.  Right upper extremity swelling  venous  Doppler negative for DVT  , now has a PICC line   6. Coagulopathy, cirrhosis?  CT of the abdomen and pelvis shows ascites, ammonia level within normal limits  . Patient may need paracentesis. GI to make further recommendations. Acute hepatitis panel negative      DVT prophylaxsis scd's   Code Status:      Code Status Orders        Start     Ordered   04/22/15 2130  Full code   Continuous     04/22/15 2129      Family Communication: Discussed in detail with the patient, all imaging results, lab results explained to the patient   Disposition Plan: As per cardiology /GI recommendations   Brief narrative:  49 y.o. male with a past medical history significant for HTN and NICM with episode of acute CHF in 2010 who presents with several months worsening dyspnea on exertion and weight gain.  The patient had an episode of congestive heart failure around 2010, had clean coronaries on cath with an EF of 20%, was treated heart failure clinic it appears for about one year, his EF returned around 60%, and then was lost to follow-up.  The patient reports to me that he was "all better" since then and so he didn't follow up with heart failure clinic or find a PCP. He was working as an Optometrist, and physically active (biking 30 miles twice a week and going to the gym). Around October of this year he lost his job, and it was in the context that he started to notice worsening shortness of breath.  Since that time, the  patient has had progressive shortness of breath with exertion, increased swelling first in the ankles and legs, belly, arms, and scrotum, return of orthopnea, paroxysmal nocturnal dyspnea (he describes this as "feeling like my chest gets heavy when I lay down, and then it all filters out when I sit back up"). In the last week he has been having apple cider vinegar as a diuretic, but his symptoms progressed so he came to the ER.  In the ED, the  patient was tachycardic, had a BNP 2601 pg per male, and new cardiomegaly on chest x-ray. His ECG showed sinus tachycardia. Of note the patient's hemoglobin was 7.9 g/dL and he was fecal occult blood positive. He denied hematemesis, melena, or frank bleeding from the rectum. He notes taking 1-2 aspirin or naproxen daily for a while given knee pain with his excess water weight.  Consultants:  GI  Cardiology   Procedures:  None   Antibiotics: Anti-infectives    None         HPI/Subjective: Patient had excellent urine output overnight, document 9L, no chest pain or shortness of breath  Objective: Filed Vitals:   04/28/15 2351 04/29/15 0402 04/29/15 0637 04/29/15 0800  BP: 108/69 114/82  114/80  Pulse:      Temp: 97.8 F (36.6 C) 97.6 F (36.4 C)  97.5 F (36.4 C)  TempSrc: Oral Oral  Oral  Resp: 20 18  16   Height:      Weight:   93.668 kg (206 lb 8 oz)   SpO2: 100% 97%  98%    Intake/Output Summary (Last 24 hours) at 04/29/15 0951 Last data filed at 04/29/15 0900  Gross per 24 hour  Intake 969.42 ml  Output   3925 ml  Net -2955.58 ml    Exam:  General: NAD Neck: JVP 14-16 cm, no thyromegaly or thyroid nodule.  Lungs: Clear to auscultation bilaterally with normal respiratory effort. CV: Lateral PMI. Heart regular S1/S2, no S3/S4, 2/6 HSM LLSB/apex. 1+ chronic edema to knees bilaterally.  Abdomen: Soft, nontender, no hepatosplenomegaly, mild distention.  Neurologic: Alert and oriented x 3.  Psych: Normal affect. Extremities: No clubbing or cyanosis. Edematous right arm.   Data Review   Micro Results Recent Results (from the past 240 hour(s))  MRSA PCR Screening     Status: None   Collection Time: 04/25/15  1:51 PM  Result Value Ref Range Status   MRSA by PCR NEGATIVE NEGATIVE Final    Comment:        The GeneXpert MRSA Assay (FDA approved for NASAL specimens only), is one component of a comprehensive MRSA colonization surveillance program. It  is not intended to diagnose MRSA infection nor to guide or monitor treatment for MRSA infections.     Radiology Reports Dg Chest 2 View  04/22/2015  CLINICAL DATA:  Two week history of cough, congestion, weakness and upper extremity swelling. EXAM: CHEST  2 VIEW COMPARISON:  05/21/2009 FINDINGS: The heart is enlarged and somewhat accentuated by the AP projection. Given the shape of the heart could not exclude a pericardial effusion. There is mild tortuosity of the thoracic aorta. The lungs are clear. No pleural effusion. The bony thorax is grossly intact. IMPRESSION: Cardiac enlargement which represents a change since the prior study. Could not exclude a pericardial effusion. No acute pulmonary findings. Electronically Signed   By: Marijo Sanes M.D.   On: 04/22/2015 18:00   Ct Chest W Contrast  04/23/2015  CLINICAL DATA:  Acute shortness of breath and  weight gain EXAM: CT CHEST, ABDOMEN, AND PELVIS WITH CONTRAST TECHNIQUE: Multidetector CT imaging of the chest, abdomen and pelvis was performed following the standard protocol during bolus administration of intravenous contrast. CONTRAST:  169mL OMNIPAQUE IOHEXOL 300 MG/ML  SOLN COMPARISON:  None. FINDINGS: CT CHEST FINDINGS Mediastinum/Nodes: Thoracic aorta is within normal limits. Pulmonary artery as visualized shows no focal abnormality. Cardiac shadow is enlarged. No pericardial effusion is seen. No significant hilar or mediastinal adenopathy is noted. Lungs/Pleura: Moderate right-sided pleural effusion is noted. Patchy atelectatic changes are seen. No focal confluent infiltrate is noted. Musculoskeletal: No acute bony abnormality is noted. General anasarca in the soft tissues of the chest is noted. CT ABDOMEN PELVIS FINDINGS Hepatobiliary: The liver and gallbladder appear within normal limits. Pancreas: No acute abnormality noted. Spleen: No acute abnormality noted. Adrenals/Urinary Tract: Scattered cystic changes are noted within the left kidney.  The adrenal glands appear within normal limits. Bladder is well distended. Stomach/Bowel: Fecal material is noted throughout the colon. Diverticular change is seen without definitive diverticulitis. No free air is noted. The appendix is not well appreciated Vascular/Lymphatic: Vascular structures appear within normal limits. Other: Mild ascites is identified. Generalized changes of anasarca are noted in the abdominal wall and pelvic soft tissues. There is a left inguinal hernia containing ascites. Musculoskeletal: No acute bony abnormality is noted. IMPRESSION: Changes of ascites and anasarca as well as a right-sided pleural effusion. These changes may be related to underlying hepatic failure. Correlation with laboratory values is recommended. Mild atelectatic changes are noted bilaterally. Cystic changes in the left kidney Electronically Signed   By: Inez Catalina M.D.   On: 04/23/2015 19:43   Ct Abdomen Pelvis W Contrast  04/23/2015  CLINICAL DATA:  Acute shortness of breath and weight gain EXAM: CT CHEST, ABDOMEN, AND PELVIS WITH CONTRAST TECHNIQUE: Multidetector CT imaging of the chest, abdomen and pelvis was performed following the standard protocol during bolus administration of intravenous contrast. CONTRAST:  121mL OMNIPAQUE IOHEXOL 300 MG/ML  SOLN COMPARISON:  None. FINDINGS: CT CHEST FINDINGS Mediastinum/Nodes: Thoracic aorta is within normal limits. Pulmonary artery as visualized shows no focal abnormality. Cardiac shadow is enlarged. No pericardial effusion is seen. No significant hilar or mediastinal adenopathy is noted. Lungs/Pleura: Moderate right-sided pleural effusion is noted. Patchy atelectatic changes are seen. No focal confluent infiltrate is noted. Musculoskeletal: No acute bony abnormality is noted. General anasarca in the soft tissues of the chest is noted. CT ABDOMEN PELVIS FINDINGS Hepatobiliary: The liver and gallbladder appear within normal limits. Pancreas: No acute abnormality noted.  Spleen: No acute abnormality noted. Adrenals/Urinary Tract: Scattered cystic changes are noted within the left kidney. The adrenal glands appear within normal limits. Bladder is well distended. Stomach/Bowel: Fecal material is noted throughout the colon. Diverticular change is seen without definitive diverticulitis. No free air is noted. The appendix is not well appreciated Vascular/Lymphatic: Vascular structures appear within normal limits. Other: Mild ascites is identified. Generalized changes of anasarca are noted in the abdominal wall and pelvic soft tissues. There is a left inguinal hernia containing ascites. Musculoskeletal: No acute bony abnormality is noted. IMPRESSION: Changes of ascites and anasarca as well as a right-sided pleural effusion. These changes may be related to underlying hepatic failure. Correlation with laboratory values is recommended. Mild atelectatic changes are noted bilaterally. Cystic changes in the left kidney Electronically Signed   By: Inez Catalina M.D.   On: 04/23/2015 19:43     CBC  Recent Labs Lab 04/22/15 1744  04/25/15 0247 04/26/15 0500  04/27/15 0516 04/28/15 0433 04/29/15 0430  WBC 4.3  < > 3.5* 4.0 3.9* 4.2 4.5  HGB 7.9*  < > 8.1* 8.0* 8.3* 8.9* 8.7*  HCT 27.6*  < > 26.9* 26.9* 28.2* 30.7* 29.3*  PLT 393  < > 340 333 321 374 347  MCV 77.3*  < > 77.3* 77.5* 76.6* 77.5* 77.7*  MCH 22.1*  < > 23.3* 23.1* 22.6* 22.5* 23.1*  MCHC 28.6*  < > 30.1 29.7* 29.4* 29.0* 29.7*  RDW 21.7*  < > 21.0* 21.0* 21.0* 21.2* 21.4*  LYMPHSABS 0.5*  --   --   --   --   --   --   MONOABS 0.6  --   --   --   --   --   --   EOSABS 0.0  --   --   --   --   --   --   BASOSABS 0.0  --   --   --   --   --   --   < > = values in this interval not displayed.  Chemistries   Recent Labs Lab 04/22/15 1934 04/22/15 2202  04/24/15 0246 04/25/15 0247 04/25/15 1250 04/26/15 0500 04/26/15 0709 04/27/15 0516 04/28/15 0433 04/29/15 0430  NA  --   --   < > 138 139  --  138  --   134* 134* 134*  K  --   --   < > 3.3* 3.8  --  3.3*  --  3.4* 4.1 4.9  CL  --   --   < > 107 106  --  103  --  96* 92* 96*  CO2  --   --   < > 26 27  --  30  --  32 35* 32  GLUCOSE  --   --   < > 100* 89  --  111*  --  169* 89 80  BUN  --   --   < > 12 10  --  13  --  12 10 10   CREATININE  --   --   < > 1.18 1.12  --  1.16  --  1.10 1.16 1.10  CALCIUM  --   --   < > 8.1* 8.4*  --  8.1*  --  8.2* 8.6* 8.4*  MG  --  1.7  --   --   --  1.7  --  1.6*  --  1.6*  --   AST 31  --   --  24 21  --  22  --  21  --   --   ALT 22  --   --  19 17  --  14*  --  16*  --   --   ALKPHOS 95  --   --  88 83  --  81  --  87  --   --   BILITOT 1.7*  --   --  2.3* 2.0*  --  1.1  --  1.7*  --   --   < > = values in this interval not displayed. ------------------------------------------------------------------------------------------------------------------ estimated creatinine clearance is 94.4 mL/min (by C-G formula based on Cr of 1.1). ------------------------------------------------------------------------------------------------------------------ No results for input(s): HGBA1C in the last 72 hours. ------------------------------------------------------------------------------------------------------------------ No results for input(s): CHOL, HDL, LDLCALC, TRIG, CHOLHDL, LDLDIRECT in the last 72 hours. ------------------------------------------------------------------------------------------------------------------ No results for input(s): TSH, T4TOTAL, T3FREE, THYROIDAB in the last 72 hours.  Invalid input(s): FREET3 ------------------------------------------------------------------------------------------------------------------ No  results for input(s): VITAMINB12, FOLATE, FERRITIN, TIBC, IRON, RETICCTPCT in the last 72 hours.  Coagulation profile  Recent Labs Lab 04/23/15 0355 04/25/15 0247 04/29/15 0430  INR 1.86* 1.84* 1.55*    No results for input(s): DDIMER in the last 72 hours.  Cardiac  Enzymes No results for input(s): CKMB, TROPONINI, MYOGLOBIN in the last 168 hours.  Invalid input(s): CK ------------------------------------------------------------------------------------------------------------------ Invalid input(s): POCBNP   CBG: No results for input(s): GLUCAP in the last 168 hours.     Studies: No results found.    Lab Results  Component Value Date   HGBA1C  09/10/2008    6.0 (NOTE) The ADA recommends the following therapeutic goal for glycemic control related to Hgb A1c measurement: Goal of therapy: <6.5 Hgb A1c  Reference: American Diabetes Association: Clinical Practice Recommendations 2010, Diabetes Care, 2010, 33: (Suppl  1).   Lab Results  Component Value Date   [facility]  09/08/2008    96        Total Cholesterol/HDL:CHD Risk Coronary Heart Disease Risk Table                     Men   Women  1/2 Average Risk   3.4   3.3  Average Risk       5.0   4.4  2 X Average Risk   9.6   7.1  3 X Average Risk  23.4   11.0        Use the calculated Patient Ratio above and the CHD Risk Table to determine the patient's CHD Risk.        ATP III CLASSIFICATION (LDL):  <100     mg/dL   Optimal  100-129  mg/dL   Near or Above                    Optimal  130-159  mg/dL   Borderline  160-189  mg/dL   High  >190     mg/dL   Very High   CREATININE 1.10 04/29/2015       Scheduled Meds: . digoxin  0.125 mg Oral Daily  . furosemide  40 mg Oral BID  . hydrALAZINE  37.5 mg Oral 3 times per day  . isosorbide mononitrate  60 mg Oral Daily  . magnesium oxide  400 mg Oral BID  . pantoprazole (PROTONIX) IV  40 mg Intravenous Q12H  . potassium chloride  20 mEq Oral BID  . sodium chloride  10-40 mL Intracatheter Q12H  . sodium chloride  3 mL Intravenous Q12H  . spironolactone  25 mg Oral Daily   Continuous Infusions: . sodium chloride 10 mL/hr at 04/24/15 1118  . sodium chloride      Principal Problem:   Acute congestive heart failure ([facility]) Active  Problems:   Obstructive sleep apnea   HYPERTENSION, BENIGN   Anemia   Anasarca   Acute on chronic systolic heart failure ([place])    Time spent: 45 minutes   [facility] Pager [phone]. If 7PM-7AM, please contact night-coverage at [email or web address], password [facility] 04/29/2015, 9:51 AM  LOS: 7 days

## 2015-04-29 NOTE — Progress Notes (Signed)
Cardiac cath. has been cancelled.Heart healthy diet served tolerated well.

## 2015-04-30 ENCOUNTER — Encounter (HOSPITAL_COMMUNITY): Payer: Self-pay | Admitting: Cardiology

## 2015-04-30 ENCOUNTER — Encounter (HOSPITAL_COMMUNITY)
Admission: EM | Disposition: A | Discharge status: Home / Self Care | Payer: Self-pay | Source: Home / Self Care | Attending: Internal Medicine

## 2015-04-30 ENCOUNTER — Ambulatory Visit: Payer: Self-pay | Admitting: Family Medicine

## 2015-04-30 DIAGNOSIS — I509 Heart failure, unspecified: Secondary | ICD-10-CM

## 2015-04-30 HISTORY — PX: CARDIAC CATHETERIZATION: SHX172

## 2015-04-30 LAB — COMPREHENSIVE METABOLIC PANEL
ALBUMIN: 2 g/dL — AB (ref 3.5–5.0)
ALK PHOS: 86 U/L (ref 38–126)
ALT: 20 U/L (ref 17–63)
ANION GAP: 4 — AB (ref 5–15)
AST: 29 U/L (ref 15–41)
BILIRUBIN TOTAL: 1.2 mg/dL (ref 0.3–1.2)
BUN: 11 mg/dL (ref 6–20)
CALCIUM: 8.3 mg/dL — AB (ref 8.9–10.3)
CO2: 29 mmol/L (ref 22–32)
CREATININE: 1.05 mg/dL (ref 0.61–1.24)
Chloride: 101 mmol/L (ref 101–111)
GFR calc non Af Amer: >60 mL/min (ref >60)
GLUCOSE: 74 mg/dL (ref 65–99)
Potassium: 5 mmol/L (ref 3.5–5.1)
Sodium: 134 mmol/L — ABNORMAL LOW (ref 135–145)
TOTAL PROTEIN: 6.1 g/dL — AB (ref 6.5–8.1)

## 2015-04-30 LAB — CBC
HCT: 30.3 % — ABNORMAL LOW (ref 39.0–52.0)
HEMATOCRIT: 29.6 % — AB (ref 39.0–52.0)
HEMOGLOBIN: 8.7 g/dL — AB (ref 13.0–17.0)
HEMOGLOBIN: 8.9 g/dL — AB (ref 13.0–17.0)
MCH: 23.1 pg — AB (ref 26.0–34.0)
MCH: 23.1 pg — AB (ref 26.0–34.0)
MCHC: 29.4 g/dL — AB (ref 30.0–36.0)
MCHC: 29.4 g/dL — AB (ref 30.0–36.0)
MCV: 78.5 fL (ref 78.0–100.0)
MCV: 78.7 fL (ref 78.0–100.0)
PLATELETS: 320 10*3/uL (ref 150–400)
Platelets: 349 10*3/uL (ref 150–400)
RBC: 3.77 MIL/uL — ABNORMAL LOW (ref 4.22–5.81)
RBC: 3.85 MIL/uL — ABNORMAL LOW (ref 4.22–5.81)
RDW: 21.8 % — ABNORMAL HIGH (ref 11.5–15.5)
RDW: 21.7 % — AB (ref 11.5–15.5)
WBC: 4.6 10*3/uL (ref 4.0–10.5)
WBC: 3.6 10*3/uL — ABNORMAL LOW (ref 4.0–10.5)

## 2015-04-30 LAB — DIGOXIN LEVEL

## 2015-04-30 LAB — PROTIME-INR
INR: 1.5 — ABNORMAL HIGH (ref 0.00–1.49)
Prothrombin Time: 18.2 seconds — ABNORMAL HIGH (ref 11.6–15.2)

## 2015-04-30 LAB — POCT ACTIVATED CLOTTING TIME: Activated Clotting Time: 183 seconds

## 2015-04-30 LAB — CREATININE, SERUM
CREATININE: 0.92 mg/dL (ref 0.61–1.24)
GFR calc Af Amer: >60 mL/min (ref >60)
GFR calc non Af Amer: >60 mL/min (ref >60)

## 2015-04-30 SURGERY — RIGHT/LEFT HEART CATH AND CORONARY ANGIOGRAPHY

## 2015-04-30 MED ORDER — SODIUM CHLORIDE 0.9 % IV SOLN: 250.0000 mL | INTRAVENOUS | Status: DC | PRN

## 2015-04-30 MED ORDER — FENTANYL CITRATE (PF) 100 MCG/2ML IJ SOLN
INTRAMUSCULAR | Status: AC
  Filled 2015-04-30: qty 2

## 2015-04-30 MED ORDER — MIDAZOLAM HCL 2 MG/2ML IJ SOLN
INTRAMUSCULAR | Status: DC | PRN
  Administered 2015-04-30: 1 mg via INTRAVENOUS

## 2015-04-30 MED ORDER — ONDANSETRON HCL 4 MG/2ML IJ SOLN: 4.0000 mg | Freq: Four times a day (QID) | INTRAMUSCULAR | Status: DC | PRN

## 2015-04-30 MED ORDER — LIDOCAINE HCL (PF) 1 % IJ SOLN
INTRAMUSCULAR | Status: AC
  Filled 2015-04-30: qty 30

## 2015-04-30 MED ORDER — IOHEXOL 350 MG/ML SOLN
INTRAVENOUS | Status: DC | PRN
Start: 2015-04-30 — End: 2015-04-30
  Administered 2015-04-30: 90 mL via INTRA_ARTERIAL

## 2015-04-30 MED ORDER — SODIUM CHLORIDE 0.9 % IV SOLN: INTRAVENOUS | Status: DC

## 2015-04-30 MED ORDER — MIDAZOLAM HCL 2 MG/2ML IJ SOLN
INTRAMUSCULAR | Status: AC
  Filled 2015-04-30: qty 2

## 2015-04-30 MED ORDER — FUROSEMIDE 40 MG PO TABS
40.0000 mg | ORAL_TABLET | Freq: Every day | ORAL | Status: DC
  Administered 2015-05-01 – 2015-05-02 (×2): 40 mg via ORAL
  Filled 2015-04-30 (×2): qty 1

## 2015-04-30 MED ORDER — ASPIRIN 81 MG PO CHEW
81.0000 mg | CHEWABLE_TABLET | ORAL | Status: AC
  Administered 2015-04-30: 81 mg via ORAL
  Filled 2015-04-30: qty 1

## 2015-04-30 MED ORDER — LIDOCAINE HCL (PF) 1 % IJ SOLN
INTRAMUSCULAR | Status: DC | PRN
  Administered 2015-04-30: 09:00:00

## 2015-04-30 MED ORDER — HEPARIN SODIUM (PORCINE) 5000 UNIT/ML IJ SOLN
5000.0000 [IU] | Freq: Three times a day (TID) | INTRAMUSCULAR | Status: DC
  Administered 2015-04-30 – 2015-05-01 (×4): 5000 [IU] via SUBCUTANEOUS
  Filled 2015-04-30 (×4): qty 1

## 2015-04-30 MED ORDER — ACETAMINOPHEN 325 MG PO TABS
650.0000 mg | ORAL_TABLET | ORAL | Status: DC | PRN
  Administered 2015-04-30: 650 mg via ORAL
  Filled 2015-04-30: qty 2

## 2015-04-30 MED ORDER — HEPARIN (PORCINE) IN NACL 2-0.9 UNIT/ML-% IJ SOLN
INTRAMUSCULAR | Status: AC
  Filled 2015-04-30: qty 1500

## 2015-04-30 MED ORDER — SODIUM CHLORIDE 0.9 % IJ SOLN
3.0000 mL | Freq: Two times a day (BID) | INTRAMUSCULAR | Status: DC
  Administered 2015-04-30 – 2015-05-01 (×3): 3 mL via INTRAVENOUS

## 2015-04-30 MED ORDER — VERAPAMIL HCL 2.5 MG/ML IV SOLN
INTRAVENOUS | Status: AC
  Filled 2015-04-30: qty 2

## 2015-04-30 MED ORDER — PEG 3350-KCL-NA BICARB-NACL 420 G PO SOLR
4000.0000 mL | Freq: Once | ORAL | Status: AC
  Administered 2015-04-30: 4000 mL via ORAL
  Filled 2015-04-30: qty 4000

## 2015-04-30 MED ORDER — HYDRALAZINE HCL 25 MG PO TABS
25.0000 mg | ORAL_TABLET | Freq: Three times a day (TID) | ORAL | Status: DC
  Administered 2015-04-30 – 2015-05-02 (×7): 25 mg via ORAL
  Filled 2015-04-30 (×7): qty 1

## 2015-04-30 MED ORDER — FENTANYL CITRATE (PF) 100 MCG/2ML IJ SOLN
INTRAMUSCULAR | Status: DC | PRN
  Administered 2015-04-30: 25 ug via INTRAVENOUS

## 2015-04-30 MED ORDER — SODIUM CHLORIDE 0.9 % IV SOLN
250.0000 mL | INTRAVENOUS | Status: DC | PRN
Start: 2015-04-30 — End: 2015-05-02

## 2015-04-30 MED ORDER — VERAPAMIL HCL 2.5 MG/ML IV SOLN
INTRAVENOUS | Status: DC | PRN
  Administered 2015-04-30: 09:00:00 via INTRA_ARTERIAL

## 2015-04-30 MED ORDER — SODIUM CHLORIDE 0.9 % IJ SOLN
3.0000 mL | INTRAMUSCULAR | Status: DC | PRN
Start: 2015-04-30 — End: 2015-05-02

## 2015-04-30 MED ORDER — SODIUM CHLORIDE 0.9 % IJ SOLN: 3.0000 mL | INTRAMUSCULAR | Status: DC | PRN

## 2015-04-30 MED ORDER — SODIUM CHLORIDE 0.9 % IJ SOLN
3.0000 mL | Freq: Two times a day (BID) | INTRAMUSCULAR | Status: DC
  Administered 2015-04-30 – 2015-05-01 (×2): 3 mL via INTRAVENOUS

## 2015-04-30 MED ORDER — HEPARIN SODIUM (PORCINE) 1000 UNIT/ML IJ SOLN
INTRAMUSCULAR | Status: AC
  Filled 2015-04-30: qty 1

## 2015-04-30 MED ORDER — HEPARIN SODIUM (PORCINE) 1000 UNIT/ML IJ SOLN
INTRAMUSCULAR | Status: DC | PRN
  Administered 2015-04-30: 4500 [IU] via INTRAVENOUS

## 2015-04-30 SURGICAL SUPPLY — 13 items
CATH INFINITI 5 FR JL3.5 (CATHETERS) ×3 IMPLANT
CATH INFINITI 5FR ANG PIGTAIL (CATHETERS) ×3 IMPLANT
CATH INFINITI JR4 5F (CATHETERS) ×3 IMPLANT
CATH SWAN GANZ 7F STRAIGHT (CATHETERS) ×3 IMPLANT
DEVICE RAD COMP TR BAND LRG (VASCULAR PRODUCTS) ×3 IMPLANT
GLIDESHEATH SLEND SS 6F .021 (SHEATH) ×3 IMPLANT
KIT HEART LEFT (KITS) ×3 IMPLANT
PACK CARDIAC CATHETERIZATION (CUSTOM PROCEDURE TRAY) ×3 IMPLANT
SHEATH PINNACLE 7F 10CM (SHEATH) ×3 IMPLANT
SYR MEDRAD MARK V 150ML (SYRINGE) ×3 IMPLANT
TRANSDUCER W/STOPCOCK (MISCELLANEOUS) ×6 IMPLANT
TUBING CIL FLEX 10 FLL-RA (TUBING) ×3 IMPLANT
WIRE SAFE-T 1.5MM-J .035X260CM (WIRE) ×3 IMPLANT

## 2015-04-30 NOTE — Progress Notes (Signed)
Patient ID: Kyle Conrad, male   DOB: 1965-05-18, 49 y.o.   MRN: [id number] [facility] Gastroenterology Progress Note  Kyle Conrad 49 y.o. [birth year]   Subjective: Resting comfortably. Denies abdominal pain.  Objective: Vital signs in last 24 hours: Filed Vitals:   04/30/15 1200 04/30/15 1215  BP: 96/69 105/75  Pulse: 81 78  Temp:    Resp: 24 11    Physical Exam: Gen: lethargic, no acute distress CV: RRR Chest: CTA anteriorly Abd: soft, nontender, nondistended, +BS Ext: no edema  Lab Results:  Recent Labs  04/28/15 0433 04/29/15 0430 04/30/15 0430  NA 134* 134* 134*  K 4.1 4.9 5.0  CL 92* 96* 101  CO2 35* 32 29  GLUCOSE 89 80 74  BUN 10 10 11   CREATININE 1.16 1.10 1.05  CALCIUM 8.6* 8.4* 8.3*  MG 1.6*  --   --     Recent Labs  04/30/15 0430  AST 29  ALT 20  ALKPHOS 86  BILITOT 1.2  PROT 6.1*  ALBUMIN 2.0*    Recent Labs  04/29/15 0430 04/30/15 0430  WBC 4.5 4.6  HGB 8.7* 8.7*  HCT 29.3* 29.6*  MCV 77.7* 78.5  PLT 347 349    Recent Labs  04/29/15 0430 04/30/15 0430  LABPROT 18.7* 18.2*  INR 1.55* 1.50*      Assessment/Plan: Anemia and heme positive stool in need of an inpt colon/EGD. S/P heart cath today. Severe CHF managed by cards. Colon prep this afternoon. EGD/Colon tomorrow with Diprivan sedation. Clear liquid diet. NPO p MN.   [place] C. 04/30/2015, 12:28 PM  Pager [phone]  If no answer or after 5 PM call [phone]

## 2015-04-30 NOTE — Progress Notes (Signed)
Spoke w pt. He plans to return home at disch. He realizes w no ins will not be able to arrange hhpt but may be able to get hhrn to ck on pt. Will cont to follow.

## 2015-04-30 NOTE — Progress Notes (Addendum)
PT Cancellation Note  Patient Details Name: Kyle Conrad MRN: [id number] DOB: 1965-07-12   Cancelled Treatment:    Reason Eval/Treat Not Completed: Patient at procedure or test/unavailable (Pt in CATH lab. Will return as able.  Thanks. )   Dema Severin, [place] 04/30/2015, 7:59 AM  [facility] [phone] (pager)

## 2015-04-30 NOTE — Progress Notes (Signed)
PT Cancellation Note  Patient Details Name: Kyle Conrad MRN: [id number] DOB: [birth year]   Cancelled Treatment:    Reason Eval/Treat Not Completed: Medical issues which prohibited therapy (Pt still on bedrest after CATh. will return tomorrow. Thanks)   Irwin Brakeman F 04/30/2015, 12:30 PM  Sarye Kath,PT Acute Rehabilitation [phone] (pager)

## 2015-04-30 NOTE — Progress Notes (Addendum)
[facility] PROGRESS NOTE  Kyle Conrad [id number] DOB: [birth year] DOA: 04/22/2015 PCP: No PCP Per Patient  Length of stay: 8   Assessment/Plan: Principal Problem:   Acute congestive heart failure ([place]) Active Problems:   Obstructive sleep apnea   HYPERTENSION, BENIGN   Anemia   Anasarca   Acute on chronic systolic heart failure ([place])    1. Acute systolic CHF:  [id number]  LVEF 10-15% with restrictive diastolic filling pattern and moderate right ventricular dysfunction. He has moderate to severe mitral regurgitation as well likely related to annular dilatation. PASP 48 mmHg. Out 2 L x 24 hrs . He has had a marked diuresis 81 lbs Continue diuresis per cardiology, Currently on Lasix 40 mg by mouth daily, hydralazine, Imdur, digoxin, Aldactone PICC line placed to measure Co-ox level on 12/10 Cardiac cath today, EF 30% on cardiac cath, no significant coronary artery disease  Hypokalemia and hypomagnesemia Replete  2. HTN:  Hypertensive at admission. Patient had angioedema with lisinopril.  Now on IV Lasix,PO hydralazine, Coreg, cardiology did titrate medications based on 2-D echo   3. Microcytic Anemia and positive fecal occult blood:  Without gross bleeding or melena., But patient endorses dark stools for the last 2-3 weeks Intermittent NSAID use+. Presented with a hemoglobin of 7.9> 8.1 > 8.0>8.3 >8.7  after one unit of blood, INR 1.86, status post vitamin K 1.50 Continue patient on Protonix currently no active bleeding, discussed with Dr. Magod/Dr. schooler, updated them about proceeding with EGD and colonoscopy as per cardiology recommendations, hopefully will have intervention this week Serum iron 14,  received ferrous gluconate 12/9 Nothing by mouth after midnight, start prep for colonoscopy   4. Hypoalbuminemia:  Patient anasarcic, but urine dipstick negative. Creatinine normal. -Urine protein/creatinine ratio, UA negative for protein Concern  for amyloidosis  SPEP/UPEP with hypoalbuminemia. No apparent evidence of monoclonal protein. No M spike observed   5. Right upper extremity swelling  venous  Doppler negative for DVT  , now has a PICC line   6. Coagulopathy, cirrhosis?  CT of the abdomen and pelvis shows ascites, ammonia level within normal limits  . Patient may need paracentesis. GI to make further recommendations. Acute hepatitis panel negative      DVT prophylaxsis scd's   Code Status:      Code Status Orders        Start     Ordered   04/22/15 2130  Full code   Continuous     04/22/15 2129      Family Communication: Discussed in detail with the patient, all imaging results, lab results explained to the patient   Disposition Plan: As per cardiology /GI recommendations   Brief narrative:  49 y.o. male with a past medical history significant for HTN and NICM with episode of acute CHF in 2010 who presents with several months worsening dyspnea on exertion and weight gain.  The patient had an episode of congestive heart failure around 2010, had clean coronaries on cath with an EF of 20%, was treated heart failure clinic it appears for about one year, his EF returned around 60%, and then was lost to follow-up.  The patient reports to me that he was "all better" since then and so he didn't follow up with heart failure clinic or find a PCP. He was working as an Optometrist, and physically active (biking 30 miles twice a week and going to the gym). Around October of this year he lost his job, and  it was in the context that he started to notice worsening shortness of breath.  Since that time, the patient has had progressive shortness of breath with exertion, increased swelling first in the ankles and legs, belly, arms, and scrotum, return of orthopnea, paroxysmal nocturnal dyspnea (he describes this as "feeling like my chest gets heavy when I lay down, and then it all filters out when I sit back up"). In the last week  he has been having apple cider vinegar as a diuretic, but his symptoms progressed so he came to the ER.  In the ED, the patient was tachycardic, had a BNP 2601 pg per male, and new cardiomegaly on chest x-ray. His ECG showed sinus tachycardia. Of note the patient's hemoglobin was 7.9 g/dL and he was fecal occult blood positive. He denied hematemesis, melena, or frank bleeding from the rectum. He notes taking 1-2 aspirin or naproxen daily for a while given knee pain with his excess water weight.  Consultants:  GI  Cardiology   Procedures:  None   Antibiotics: Anti-infectives    None         HPI/Subjective: Patient diuresed extensive again yesterday off milrinone and IV Lasix. Still mildly lightheaded  Objective: Filed Vitals:   04/30/15 0930 04/30/15 0945 04/30/15 0950 04/30/15 0955  BP: 122/89 125/89 117/88 118/86  Pulse: 93 90 91 87  Temp:      TempSrc:      Resp: 18 12 16 16   Height:      Weight:      SpO2: 94% 97% 95% 95%    Intake/Output Summary (Last 24 hours) at 04/30/15 1021 Last data filed at 04/30/15 0900  Gross per 24 hour  Intake   1040 ml  Output   2865 ml  Net  -1825 ml    Exam:  General: NAD Neck: JVP 14-16 cm, no thyromegaly or thyroid nodule.  Lungs: Clear to auscultation bilaterally with normal respiratory effort. CV: Lateral PMI. Heart regular S1/S2, no S3/S4, 2/6 HSM LLSB/apex. 1+ chronic edema to knees bilaterally.  Abdomen: Soft, nontender, no hepatosplenomegaly, mild distention.  Neurologic: Alert and oriented x 3.  Psych: Normal affect. Extremities: No clubbing or cyanosis. Edematous right arm.   Data Review   Micro Results Recent Results (from the past 240 hour(s))  MRSA PCR Screening     Status: None   Collection Time: 04/25/15  1:51 PM  Result Value Ref Range Status   MRSA by PCR NEGATIVE NEGATIVE Final    Comment:        The GeneXpert MRSA Assay (FDA approved for NASAL specimens only), is one component of  a comprehensive MRSA colonization surveillance program. It is not intended to diagnose MRSA infection nor to guide or monitor treatment for MRSA infections.     Radiology Reports Dg Chest 2 View  04/22/2015  CLINICAL DATA:  Two week history of cough, congestion, weakness and upper extremity swelling. EXAM: CHEST  2 VIEW COMPARISON:  05/21/2009 FINDINGS: The heart is enlarged and somewhat accentuated by the AP projection. Given the shape of the heart could not exclude a pericardial effusion. There is mild tortuosity of the thoracic aorta. The lungs are clear. No pleural effusion. The bony thorax is grossly intact. IMPRESSION: Cardiac enlargement which represents a change since the prior study. Could not exclude a pericardial effusion. No acute pulmonary findings. Electronically Signed   By: Marijo Sanes M.D.   On: 04/22/2015 18:00   Ct Chest W Contrast  04/23/2015  CLINICAL DATA:  Acute shortness of breath and weight gain EXAM: CT CHEST, ABDOMEN, AND PELVIS WITH CONTRAST TECHNIQUE: Multidetector CT imaging of the chest, abdomen and pelvis was performed following the standard protocol during bolus administration of intravenous contrast. CONTRAST:  161mL OMNIPAQUE IOHEXOL 300 MG/ML  SOLN COMPARISON:  None. FINDINGS: CT CHEST FINDINGS Mediastinum/Nodes: Thoracic aorta is within normal limits. Pulmonary artery as visualized shows no focal abnormality. Cardiac shadow is enlarged. No pericardial effusion is seen. No significant hilar or mediastinal adenopathy is noted. Lungs/Pleura: Moderate right-sided pleural effusion is noted. Patchy atelectatic changes are seen. No focal confluent infiltrate is noted. Musculoskeletal: No acute bony abnormality is noted. General anasarca in the soft tissues of the chest is noted. CT ABDOMEN PELVIS FINDINGS Hepatobiliary: The liver and gallbladder appear within normal limits. Pancreas: No acute abnormality noted. Spleen: No acute abnormality noted. Adrenals/Urinary Tract:  Scattered cystic changes are noted within the left kidney. The adrenal glands appear within normal limits. Bladder is well distended. Stomach/Bowel: Fecal material is noted throughout the colon. Diverticular change is seen without definitive diverticulitis. No free air is noted. The appendix is not well appreciated Vascular/Lymphatic: Vascular structures appear within normal limits. Other: Mild ascites is identified. Generalized changes of anasarca are noted in the abdominal wall and pelvic soft tissues. There is a left inguinal hernia containing ascites. Musculoskeletal: No acute bony abnormality is noted. IMPRESSION: Changes of ascites and anasarca as well as a right-sided pleural effusion. These changes may be related to underlying hepatic failure. Correlation with laboratory values is recommended. Mild atelectatic changes are noted bilaterally. Cystic changes in the left kidney Electronically Signed   By: Inez Catalina M.D.   On: 04/23/2015 19:43   Ct Abdomen Pelvis W Contrast  04/23/2015  CLINICAL DATA:  Acute shortness of breath and weight gain EXAM: CT CHEST, ABDOMEN, AND PELVIS WITH CONTRAST TECHNIQUE: Multidetector CT imaging of the chest, abdomen and pelvis was performed following the standard protocol during bolus administration of intravenous contrast. CONTRAST:  125mL OMNIPAQUE IOHEXOL 300 MG/ML  SOLN COMPARISON:  None. FINDINGS: CT CHEST FINDINGS Mediastinum/Nodes: Thoracic aorta is within normal limits. Pulmonary artery as visualized shows no focal abnormality. Cardiac shadow is enlarged. No pericardial effusion is seen. No significant hilar or mediastinal adenopathy is noted. Lungs/Pleura: Moderate right-sided pleural effusion is noted. Patchy atelectatic changes are seen. No focal confluent infiltrate is noted. Musculoskeletal: No acute bony abnormality is noted. General anasarca in the soft tissues of the chest is noted. CT ABDOMEN PELVIS FINDINGS Hepatobiliary: The liver and gallbladder appear  within normal limits. Pancreas: No acute abnormality noted. Spleen: No acute abnormality noted. Adrenals/Urinary Tract: Scattered cystic changes are noted within the left kidney. The adrenal glands appear within normal limits. Bladder is well distended. Stomach/Bowel: Fecal material is noted throughout the colon. Diverticular change is seen without definitive diverticulitis. No free air is noted. The appendix is not well appreciated Vascular/Lymphatic: Vascular structures appear within normal limits. Other: Mild ascites is identified. Generalized changes of anasarca are noted in the abdominal wall and pelvic soft tissues. There is a left inguinal hernia containing ascites. Musculoskeletal: No acute bony abnormality is noted. IMPRESSION: Changes of ascites and anasarca as well as a right-sided pleural effusion. These changes may be related to underlying hepatic failure. Correlation with laboratory values is recommended. Mild atelectatic changes are noted bilaterally. Cystic changes in the left kidney Electronically Signed   By: Inez Catalina M.D.   On: 04/23/2015 19:43     CBC  Recent Labs Lab 04/26/15 0500  04/27/15 0516 04/28/15 0433 04/29/15 0430 04/30/15 0430  WBC 4.0 3.9* 4.2 4.5 4.6  HGB 8.0* 8.3* 8.9* 8.7* 8.7*  HCT 26.9* 28.2* 30.7* 29.3* 29.6*  PLT 333 321 374 347 349  MCV 77.5* 76.6* 77.5* 77.7* 78.5  MCH 23.1* 22.6* 22.5* 23.1* 23.1*  MCHC 29.7* 29.4* 29.0* 29.7* 29.4*  RDW 21.0* 21.0* 21.2* 21.4* 21.8*    Chemistries   Recent Labs Lab 04/24/15 0246 04/25/15 0247 04/25/15 1250 04/26/15 0500 04/26/15 0709 04/27/15 0516 04/28/15 0433 04/29/15 0430 04/30/15 0430  NA 138 139  --  138  --  134* 134* 134* 134*  K 3.3* 3.8  --  3.3*  --  3.4* 4.1 4.9 5.0  CL 107 106  --  103  --  96* 92* 96* 101  CO2 26 27  --  30  --  32 35* 32 29  GLUCOSE 100* 89  --  111*  --  169* 89 80 74  BUN 12 10  --  13  --  12 10 10 11   CREATININE 1.18 1.12  --  1.16  --  1.10 1.16 1.10 1.05   CALCIUM 8.1* 8.4*  --  8.1*  --  8.2* 8.6* 8.4* 8.3*  MG  --   --  1.7  --  1.6*  --  1.6*  --   --   AST 24 21  --  22  --  21  --   --  29  ALT 19 17  --  14*  --  16*  --   --  20  ALKPHOS 88 83  --  81  --  87  --   --  86  BILITOT 2.3* 2.0*  --  1.1  --  1.7*  --   --  1.2   ------------------------------------------------------------------------------------------------------------------ estimated creatinine clearance is 91.4 mL/min (by C-G formula based on Cr of 1.05). ------------------------------------------------------------------------------------------------------------------ No results for input(s): HGBA1C in the last 72 hours. ------------------------------------------------------------------------------------------------------------------ No results for input(s): CHOL, HDL, LDLCALC, TRIG, CHOLHDL, LDLDIRECT in the last 72 hours. ------------------------------------------------------------------------------------------------------------------ No results for input(s): TSH, T4TOTAL, T3FREE, THYROIDAB in the last 72 hours.  Invalid input(s): FREET3 ------------------------------------------------------------------------------------------------------------------ No results for input(s): VITAMINB12, FOLATE, FERRITIN, TIBC, IRON, RETICCTPCT in the last 72 hours.  Coagulation profile  Recent Labs Lab 04/25/15 0247 04/29/15 0430 04/30/15 0430  INR 1.84* 1.55* 1.50*    No results for input(s): DDIMER in the last 72 hours.  Cardiac Enzymes No results for input(s): CKMB, TROPONINI, MYOGLOBIN in the last 168 hours.  Invalid input(s): CK ------------------------------------------------------------------------------------------------------------------ Invalid input(s): POCBNP   CBG: No results for input(s): GLUCAP in the last 168 hours.     Studies: No results found.    Lab Results  Component Value Date   HGBA1C  09/10/2008    6.0 (NOTE) The ADA recommends the  following therapeutic goal for glycemic control related to Hgb A1c measurement: Goal of therapy: <6.5 Hgb A1c  Reference: American Diabetes Association: Clinical Practice Recommendations 2010, Diabetes Care, 2010, 33: (Suppl  1).   Lab Results  Component Value Date   [facility]  09/08/2008    96        Total Cholesterol/HDL:CHD Risk Coronary Heart Disease Risk Table                     Men   Women  1/2 Average Risk   3.4   3.3  Average Risk       5.0   4.4  2 X Average Risk   9.6   7.1  3 X Average Risk  23.4   11.0        Use the calculated Patient Ratio above and the CHD Risk Table to determine the patient's CHD Risk.        ATP III CLASSIFICATION (LDL):  <100     mg/dL   Optimal  100-129  mg/dL   Near or Above                    Optimal  130-159  mg/dL   Borderline  160-189  mg/dL   High  >190     mg/dL   Very High   CREATININE 1.05 04/30/2015       Scheduled Meds: . digoxin  0.125 mg Oral Daily  . [START ON 05/01/2015] furosemide  40 mg Oral Daily  . heparin  5,000 Units Subcutaneous 3 times per day  . hydrALAZINE  25 mg Oral 3 times per day  . isosorbide mononitrate  60 mg Oral Daily  . magnesium oxide  400 mg Oral BID  . pantoprazole  40 mg Oral BID  . sodium chloride  10-40 mL Intracatheter Q12H  . sodium chloride  3 mL Intravenous Q12H  . sodium chloride  3 mL Intravenous Q12H  . spironolactone  25 mg Oral Daily   Continuous Infusions: . sodium chloride 10 mL/hr at 04/24/15 1118    Principal Problem:   Acute congestive heart failure ([facility]) Active Problems:   Obstructive sleep apnea   HYPERTENSION, BENIGN   Anemia   Anasarca   Acute on chronic systolic heart failure ([place])    Time spent: 45 minutes   [facility] Pager [phone]. If 7PM-7AM, please contact night-coverage at [email or web address], password [facility] 04/30/2015, 10:21 AM  LOS: 8 days

## 2015-04-30 NOTE — Plan of Care (Signed)
Problem: Activity: Goal: Capacity to carry out activities will improve Outcome: Progressing Patient was able to sit up in the chair during the shift. He was able to transfer to the bedside commode without assistance. He was able to stand for an extended period while cleaning himself. He had no complaints of dizziness or shortness of breath but did experience some weakness.  Problem: Cardiac: Goal: Ability to achieve and maintain adequate cardiopulmonary perfusion will improve Outcome: Progressing Patient is diuresing well; his weight is decreasing and his urine output is adequate.

## 2015-04-30 NOTE — Progress Notes (Signed)
Patient ID: Kyle Conrad, male   DOB: 03-05-1966, 49 y.o.   MRN: [id number]   SUBJECTIVE: Patient diuresed extensive again yesterday off milrinone and IV Lasix.  Still mildly lightheaded.  Creatinine stable.  CVP 7-8.   Scheduled Meds: . [MAR Hold] digoxin  0.125 mg Oral Daily  . hydrALAZINE  25 mg Oral 3 times per day  . [MAR Hold] isosorbide mononitrate  60 mg Oral Daily  . [MAR Hold] magnesium oxide  400 mg Oral BID  . [MAR Hold] pantoprazole  40 mg Oral BID  . [MAR Hold] sodium chloride  10-40 mL Intracatheter Q12H  . sodium chloride  3 mL Intravenous Q12H  . [MAR Hold] spironolactone  25 mg Oral Daily   Continuous Infusions: . sodium chloride 10 mL/hr at 04/24/15 1118  . sodium chloride     PRN Meds:.sodium chloride, [MAR Hold] acetaminophen, [MAR Hold] sodium chloride, sodium chloride    Filed Vitals:   04/29/15 2330 04/30/15 0400 04/30/15 0406 04/30/15 0757  BP: 105/82  115/80   Pulse:      Temp: 98.9 F (37.2 C)  98.9 F (37.2 C)   TempSrc: Oral  Oral   Resp: 20  20   Height:      Weight:  199 lb 14.4 oz (90.674 kg)    SpO2: 97%  96% 98%    Intake/Output Summary (Last 24 hours) at 04/30/15 0801 Last data filed at 04/30/15 0700  Gross per 24 hour  Intake   1080 ml  Output   3040 ml  Net  -1960 ml    LABS: Basic Metabolic Panel:  Recent Labs  04/28/15 0433 04/29/15 0430 04/30/15 0430  NA 134* 134* 134*  K 4.1 4.9 5.0  CL 92* 96* 101  CO2 35* 32 29  GLUCOSE 89 80 74  BUN 10 10 11   CREATININE 1.16 1.10 1.05  CALCIUM 8.6* 8.4* 8.3*  MG 1.6*  --   --    Liver Function Tests:  Recent Labs  04/30/15 0430  AST 29  ALT 20  ALKPHOS 86  BILITOT 1.2  PROT 6.1*  ALBUMIN 2.0*   No results for input(s): LIPASE, AMYLASE in the last 72 hours. CBC:  Recent Labs  04/29/15 0430 04/30/15 0430  WBC 4.5 4.6  HGB 8.7* 8.7*  HCT 29.3* 29.6*  MCV 77.7* 78.5  PLT 347 349   Cardiac Enzymes: No results for input(s): CKTOTAL, CKMB, CKMBINDEX,  TROPONINI in the last 72 hours. BNP: Invalid input(s): POCBNP D-Dimer: No results for input(s): DDIMER in the last 72 hours. Hemoglobin A1C: No results for input(s): HGBA1C in the last 72 hours. Fasting Lipid Panel: No results for input(s): CHOL, HDL, LDLCALC, TRIG, CHOLHDL, LDLDIRECT in the last 72 hours. Thyroid Function Tests: No results for input(s): TSH, T4TOTAL, T3FREE, THYROIDAB in the last 72 hours.  Invalid input(s): FREET3 Anemia Panel: No results for input(s): VITAMINB12, FOLATE, FERRITIN, TIBC, IRON, RETICCTPCT in the last 72 hours.  RADIOLOGY: Dg Chest 2 View  04/22/2015  CLINICAL DATA:  Two week history of cough, congestion, weakness and upper extremity swelling. EXAM: CHEST  2 VIEW COMPARISON:  05/21/2009 FINDINGS: The heart is enlarged and somewhat accentuated by the AP projection. Given the shape of the heart could not exclude a pericardial effusion. There is mild tortuosity of the thoracic aorta. The lungs are clear. No pleural effusion. The bony thorax is grossly intact. IMPRESSION: Cardiac enlargement which represents a change since the prior study. Could not exclude a pericardial effusion. No  acute pulmonary findings. Electronically Signed   By: Marijo Sanes M.D.   On: 04/22/2015 18:00   Ct Chest W Contrast  04/23/2015  CLINICAL DATA:  Acute shortness of breath and weight gain EXAM: CT CHEST, ABDOMEN, AND PELVIS WITH CONTRAST TECHNIQUE: Multidetector CT imaging of the chest, abdomen and pelvis was performed following the standard protocol during bolus administration of intravenous contrast. CONTRAST:  154mL OMNIPAQUE IOHEXOL 300 MG/ML  SOLN COMPARISON:  None. FINDINGS: CT CHEST FINDINGS Mediastinum/Nodes: Thoracic aorta is within normal limits. Pulmonary artery as visualized shows no focal abnormality. Cardiac shadow is enlarged. No pericardial effusion is seen. No significant hilar or mediastinal adenopathy is noted. Lungs/Pleura: Moderate right-sided pleural effusion is  noted. Patchy atelectatic changes are seen. No focal confluent infiltrate is noted. Musculoskeletal: No acute bony abnormality is noted. General anasarca in the soft tissues of the chest is noted. CT ABDOMEN PELVIS FINDINGS Hepatobiliary: The liver and gallbladder appear within normal limits. Pancreas: No acute abnormality noted. Spleen: No acute abnormality noted. Adrenals/Urinary Tract: Scattered cystic changes are noted within the left kidney. The adrenal glands appear within normal limits. Bladder is well distended. Stomach/Bowel: Fecal material is noted throughout the colon. Diverticular change is seen without definitive diverticulitis. No free air is noted. The appendix is not well appreciated Vascular/Lymphatic: Vascular structures appear within normal limits. Other: Mild ascites is identified. Generalized changes of anasarca are noted in the abdominal wall and pelvic soft tissues. There is a left inguinal hernia containing ascites. Musculoskeletal: No acute bony abnormality is noted. IMPRESSION: Changes of ascites and anasarca as well as a right-sided pleural effusion. These changes may be related to underlying hepatic failure. Correlation with laboratory values is recommended. Mild atelectatic changes are noted bilaterally. Cystic changes in the left kidney Electronically Signed   By: Inez Catalina M.D.   On: 04/23/2015 19:43   Ct Abdomen Pelvis W Contrast  04/23/2015  CLINICAL DATA:  Acute shortness of breath and weight gain EXAM: CT CHEST, ABDOMEN, AND PELVIS WITH CONTRAST TECHNIQUE: Multidetector CT imaging of the chest, abdomen and pelvis was performed following the standard protocol during bolus administration of intravenous contrast. CONTRAST:  165mL OMNIPAQUE IOHEXOL 300 MG/ML  SOLN COMPARISON:  None. FINDINGS: CT CHEST FINDINGS Mediastinum/Nodes: Thoracic aorta is within normal limits. Pulmonary artery as visualized shows no focal abnormality. Cardiac shadow is enlarged. No pericardial effusion is  seen. No significant hilar or mediastinal adenopathy is noted. Lungs/Pleura: Moderate right-sided pleural effusion is noted. Patchy atelectatic changes are seen. No focal confluent infiltrate is noted. Musculoskeletal: No acute bony abnormality is noted. General anasarca in the soft tissues of the chest is noted. CT ABDOMEN PELVIS FINDINGS Hepatobiliary: The liver and gallbladder appear within normal limits. Pancreas: No acute abnormality noted. Spleen: No acute abnormality noted. Adrenals/Urinary Tract: Scattered cystic changes are noted within the left kidney. The adrenal glands appear within normal limits. Bladder is well distended. Stomach/Bowel: Fecal material is noted throughout the colon. Diverticular change is seen without definitive diverticulitis. No free air is noted. The appendix is not well appreciated Vascular/Lymphatic: Vascular structures appear within normal limits. Other: Mild ascites is identified. Generalized changes of anasarca are noted in the abdominal wall and pelvic soft tissues. There is a left inguinal hernia containing ascites. Musculoskeletal: No acute bony abnormality is noted. IMPRESSION: Changes of ascites and anasarca as well as a right-sided pleural effusion. These changes may be related to underlying hepatic failure. Correlation with laboratory values is recommended. Mild atelectatic changes are noted bilaterally. Cystic changes  in the left kidney Electronically Signed   By: Inez Catalina M.D.   On: 04/23/2015 19:43    PHYSICAL EXAM General: NAD Neck: JVP 6-7 cm, no thyromegaly or lymphadenopathy noted. Lungs: CTAB, normal effort CV: Lateral PMI.  Heart regular S1/S2, no S3/S4, 2/6 HSM LLSB/apex.  Trace edema.   Abdomen: Soft, NT, ND, no HSM. No bruits or masses. +BS  Neurologic: Alert and oriented x 3.  Psych: Flat affect. Extremities: No clubbing or cyanosis. Edematous right arm but improved.   TELEMETRY: NSR 100s  ASSESSMENT AND PLAN: 49 yo with history of  nonischemic cardiomyopathy in 2010 with full recovery also in 2010 presented with acute systolic CHF and also was noted to be anemic (Fe deficiency with FOBT+).  He had not seen a doctor or taken any meds for several years prior to admission.   1. Acute systolic CHF: Echo ([id number]) with EF 15%, severe LV dilation with moderate LVH, moderately decreased RV systolic function, moderate-severe MR likely due to annular dilatation.  Prior echo in 2010 had showed normalized systolic function.  Likely low output with co-ox 54% initially.  We have been able to wean off milrinone.  CVP now 7-8 off IV Lasix.  Some lightheadedness. - Hold Lasix for now until after RHC.  - Lower hydralazine to 25 mg tid with lightheadedness.  Continue Imdur, spironolactone, and digoxin.   - h/o angioedema with ACEI.  No BB yet with low output, await RHC results today.  - SPEP/UPEP with hypoalbuminemia. No apparent evidence of monoclonal protein. No M spike observed. - RHC today for filling pressure and output, coronary angiography to rule out CAD as cause of cardiomyopathy.  Risks/benefits of procedure discussed with patient and he agrees to proceed.  2. Anemia: Fe deficient with FOBT+.   - Hgb stable s/p 1 UPRBC 04/23/15. Hgb 8.4 this am.  - Received IV Fe this admission.   - Will need EGD and colonoscopy, stable from cardiac standpoint for this.   3. Hypoalbuminemia: Stable. Has not interfered with diuresis. 4. ?Cirrhosis: Liver appeared normal on CT abdomen 04/23/15.  Elevated INR at baseline with ascites and hypoalbuminemia 5. Asymmetric right arm swelling:  - [place] negative for DVT.     - Chest CT did not show a mass or any obstruction to venous/arterior flow from right arm.  - Stable. Unclear etiology. 6. Needs to get out of bed, PT, ?rehab stay.   Loralie Champagne  04/30/2015 8:01 AM   Advanced Heart Failure Team Pager [phone] (M-F; 7a - 4p)  Please contact [facility] for night-coverage after hours (4p -7a ) and  weekends on [email or web address]   Patient seen with PA, agree with the above note. He has had a marked diuresis (81 lbs), creatinine stable. Co-ox 69.6% off milrinone this morning, CVP 6-7.  He is now on po Lasix.  SBP soft, 90s-110s.  Will not titrate meds today.  - LHC/RHC today.  - From my standpoint, he is stable for GI procedures, may be best to do in inpatient setting.   Loralie Champagne 04/30/2015 8:01 AM

## 2015-04-30 NOTE — Progress Notes (Signed)
Site area: right groin fv sheath Site Prior to Removal:  Level  0 Pressure Applied For:  10 minutes Manual:   yes Patient Status During Pull:  stable Post Pull Site:  Level  0 Post Pull Instructions Given:  yes Post Pull Pulses Present: yes Dressing Applied:  Small tegaderm Bedrest begins @  1000 Comments:

## 2015-05-01 ENCOUNTER — Inpatient Hospital Stay (HOSPITAL_COMMUNITY): Payer: Self-pay | Admitting: Certified Registered Nurse Anesthetist

## 2015-05-01 ENCOUNTER — Inpatient Hospital Stay (HOSPITAL_COMMUNITY): Payer: MEDICAID | Admitting: Certified Registered Nurse Anesthetist

## 2015-05-01 ENCOUNTER — Encounter (HOSPITAL_COMMUNITY): Payer: Self-pay | Admitting: *Deleted

## 2015-05-01 ENCOUNTER — Encounter (HOSPITAL_COMMUNITY)
Admission: EM | Disposition: A | Discharge status: Home / Self Care | Payer: Self-pay | Source: Home / Self Care | Attending: Internal Medicine

## 2015-05-01 HISTORY — PX: ESOPHAGOGASTRODUODENOSCOPY (EGD) WITH PROPOFOL: SHX5813

## 2015-05-01 HISTORY — PX: COLONOSCOPY WITH PROPOFOL: SHX5780

## 2015-05-01 LAB — CBC
HCT: 31.3 % — ABNORMAL LOW (ref 39.0–52.0)
Hemoglobin: 9.4 g/dL — ABNORMAL LOW (ref 13.0–17.0)
MCH: 23.8 pg — ABNORMAL LOW (ref 26.0–34.0)
MCHC: 30 g/dL (ref 30.0–36.0)
MCV: 79.2 fL (ref 78.0–100.0)
PLATELETS: 336 10*3/uL (ref 150–400)
RBC: 3.95 MIL/uL — AB (ref 4.22–5.81)
RDW: 21.7 % — ABNORMAL HIGH (ref 11.5–15.5)
WBC: 4.9 10*3/uL (ref 4.0–10.5)

## 2015-05-01 LAB — POCT I-STAT 3, VENOUS BLOOD GAS (G3P V)
ACID-BASE EXCESS: 5 mmol/L — AB (ref 0.0–2.0)
Acid-Base Excess: 6 mmol/L — ABNORMAL HIGH (ref 0.0–2.0)
BICARBONATE: 29.8 meq/L — AB (ref 20.0–24.0)
BICARBONATE: 30.5 meq/L — AB (ref 20.0–24.0)
O2 Saturation: 75 %
O2 Saturation: 73 %
PCO2 VEN: 42.2 mmHg — AB (ref 45.0–50.0)
PCO2 VEN: 43.3 mmHg — AB (ref 45.0–50.0)
PH VEN: 7.445 — AB (ref 7.250–7.300)
PH VEN: 7.466 — AB (ref 7.250–7.300)
PO2 VEN: 37 mmHg (ref 30.0–45.0)
PO2 VEN: 38 mmHg (ref 30.0–45.0)
TCO2: 32 mmol/L (ref 0–100)
TCO2: 31 mmol/L (ref 0–100)

## 2015-05-01 LAB — BASIC METABOLIC PANEL
Anion gap: 6 (ref 5–15)
BUN: 10 mg/dL (ref 6–20)
CALCIUM: 8.3 mg/dL — AB (ref 8.9–10.3)
CHLORIDE: 102 mmol/L (ref 101–111)
CO2: 26 mmol/L (ref 22–32)
CREATININE: 0.89 mg/dL (ref 0.61–1.24)
Glucose, Bld: 81 mg/dL (ref 65–99)
Potassium: 4.3 mmol/L (ref 3.5–5.1)
SODIUM: 134 mmol/L — AB (ref 135–145)

## 2015-05-01 LAB — CARBOXYHEMOGLOBIN
CARBOXYHEMOGLOBIN: 1.4 % (ref 0.5–1.5)
METHEMOGLOBIN: 0.7 % (ref 0.0–1.5)
O2 SAT: 66.7 %
TOTAL HEMOGLOBIN: 8 g/dL — AB (ref 13.5–18.0)

## 2015-05-01 LAB — POCT ACTIVATED CLOTTING TIME: ACTIVATED CLOTTING TIME: 199 s

## 2015-05-01 SURGERY — ESOPHAGOGASTRODUODENOSCOPY (EGD) WITH PROPOFOL
Anesthesia: Monitor Anesthesia Care

## 2015-05-01 MED ORDER — PANTOPRAZOLE SODIUM 40 MG IV SOLR
40.0000 mg | Freq: Two times a day (BID) | INTRAVENOUS | Status: DC
Start: 2015-05-01 — End: 2015-05-02
  Administered 2015-05-01 (×2): 40 mg via INTRAVENOUS
  Filled 2015-05-01 (×2): qty 40

## 2015-05-01 MED ORDER — PHENYLEPHRINE HCL 10 MG/ML IJ SOLN
INTRAMUSCULAR | Status: DC | PRN
  Administered 2015-05-01 (×2): 40 ug via INTRAVENOUS

## 2015-05-01 MED ORDER — CARVEDILOL 3.125 MG PO TABS
3.1250 mg | ORAL_TABLET | Freq: Two times a day (BID) | ORAL | Status: DC
Start: 2015-05-01 — End: 2015-05-02
  Administered 2015-05-01 – 2015-05-02 (×2): 3.125 mg via ORAL
  Filled 2015-05-01 (×2): qty 1

## 2015-05-01 MED ORDER — PROPOFOL 500 MG/50ML IV EMUL
INTRAVENOUS | Status: DC | PRN
  Administered 2015-05-01: 50 ug/kg/min via INTRAVENOUS

## 2015-05-01 MED ORDER — BUTAMBEN-TETRACAINE-BENZOCAINE 2-2-14 % EX AERO
INHALATION_SPRAY | CUTANEOUS | Status: DC | PRN
  Administered 2015-05-01: 2 via TOPICAL

## 2015-05-01 MED ORDER — PROPOFOL 10 MG/ML IV BOLUS
INTRAVENOUS | Status: DC | PRN
  Administered 2015-05-01 (×2): 30 mg via INTRAVENOUS
  Administered 2015-05-01: 10 mg via INTRAVENOUS
  Administered 2015-05-01 (×3): 30 mg via INTRAVENOUS

## 2015-05-01 MED ORDER — LACTATED RINGERS IV SOLN
INTRAVENOUS | Status: DC
Start: 2015-05-01 — End: 2015-05-01
  Administered 2015-05-01: 09:00:00 via INTRAVENOUS
  Administered 2015-05-01: 1000 mL via INTRAVENOUS

## 2015-05-01 MED ORDER — PROMETHAZINE HCL 25 MG/ML IJ SOLN: 6.2500 mg | INTRAMUSCULAR | Status: DC | PRN

## 2015-05-01 NOTE — Op Note (Signed)
[facility] [place], [id number]   COLONOSCOPY PROCEDURE REPORT     EXAM DATE: 05/03/15  PATIENT NAME:      Kyle Conrad, Kyle Conrad           MR #:      [id number]  BIRTHDATE:       05-24-1965      VISIT #:     [phone]  ATTENDING:     Wilford Corner, MD     STATUS:     inpatient REFERRING MD: ASA CLASS:        Class IV  INDICATIONS:  The patient is a 49 yr old male here for a colonoscopy due to anemia; heme positive stool. PROCEDURE PERFORMED:     Colonoscopy, diagnostic MEDICATIONS:     Monitored anesthesia care and Per Anesthesia  ESTIMATED BLOOD LOSS:     None  CONSENT: The patient understands the risks and benefits of the procedure and understands that these risks include, but are not limited to: sedation, allergic reaction, infection, perforation and/or bleeding. Alternative means of evaluation and treatment include, among others: physical exam, x-rays, and/or surgical intervention. The patient elects to proceed with this endoscopic procedure.  DESCRIPTION OF PROCEDURE: During intra-op preparation period all mechanical & medical equipment was checked for proper function. Hand hygiene and appropriate measures for infection prevention was taken. After the risks, benefits and alternatives of the procedure were thoroughly explained, Informed consent was verified, confirmed and timeout was successfully executed by the treatment team. A digital exam revealed no abnormalities of the rectum.      The Pentax Ped Colon [id number] endoscope was introduced through the anus and advanced to the cecum, which was identified by both the appendix and ileocecal valve. The prep was suboptimal. The instrument was then slowly withdrawn as the colon was fully examined. Estimated blood loss is zero unless otherwise noted in this procedure report. Copious amounts of liquid and semi-solid stool throughout the colon limited views of the colon but  repeated irrigation and aspiration led to a fair prep from a suboptimal one. Repeat loop reduction and abdominal pressure was necessary in order to reach the cecum. Diffuse left-sided diverticulosis was noted otherwise no mucosal abnormalities were seen. No blood products were seen. Retroflexed views revealed no abnormalities.  The scope was then completely withdrawn from the patient and the procedure terminated.      ADVERSE EVENTS:      There were no immediate complications.   IMPRESSIONS:     Left-sided diverticulosis; Suboptimal prep limited views of colon as above; No bleeding seen  RECOMMENDATIONS:     Supportive care; Advance diet as tolerated    Wilford Corner, MD eSigned:  Wilford Corner, MD 2015/05/03 10:24 AM   cc:  CPT CODES: ICD CODES:  The ICD and CPT codes recommended by this software are interpretations from the data that the clinical staff has captured with the software.  The verification of the translation of this report to the ICD and CPT codes and modifiers is the sole responsibility of the health care institution and practicing physician where this report was generated.  [employer] will not be held responsible for the validity of the ICD and CPT codes included on this report.  AMA assumes no liability for data contained or not contained herein. CPT is a Designer, television/film set of the [employer].

## 2015-05-01 NOTE — Anesthesia Postprocedure Evaluation (Signed)
Anesthesia Post Note  Patient: Kyle Conrad  Procedure(s) Performed: Procedure(s) (LRB): ESOPHAGOGASTRODUODENOSCOPY (EGD) WITH PROPOFOL (N/A) COLONOSCOPY WITH PROPOFOL (N/A)  Patient location during evaluation: PACU Anesthesia Type: MAC Level of consciousness: awake and alert, oriented and awake Pain management: pain level controlled Vital Signs Assessment: post-procedure vital signs reviewed and stable Respiratory status: spontaneous breathing, nonlabored ventilation, respiratory function stable and patient connected to nasal cannula oxygen Cardiovascular status: stable and blood pressure returned to baseline Anesthetic complications: no    Last Vitals:  Filed Vitals:   05/01/15 1020 05/01/15 1030  BP: 94/64 101/66  Pulse: 74 81  Temp:    Resp: 21 14    Last Pain:  Filed Vitals:   05/01/15 1032  PainSc: 0-No pain                 Catalina Gravel

## 2015-05-01 NOTE — Brief Op Note (Signed)
Small duodenal ulcer (nonbleeding). Mild antral gastritis. Left-sided colonic diverticulosis. Soft diet and advance as tolerated. See endopro note for details/recs. Dr. Penelope Coop available to see this weekend if needed. Will sign off. Call us back if questions.

## 2015-05-01 NOTE — Op Note (Signed)
[facility] [place], [address]   ENDOSCOPY PROCEDURE REPORT  PATIENT: Kyle, Conrad  MR#: [id number] BIRTHDATE: 06-01-1965 , 49  yrs. old GENDER: male ENDOSCOPIST: Wilford Corner, MD REFERRED BY: PROCEDURE DATE:  2015/05/17 PROCEDURE:  EGD, diagnostic ASA CLASS:     Class IV INDICATIONS:  anemia; heme positive stool. MEDICATIONS: Monitored anesthesia care and Per Anesthesia TOPICAL ANESTHETIC:  DESCRIPTION OF PROCEDURE: After the risks benefits and alternatives of the procedure were thoroughly explained, informed consent was obtained.  The Pentax Gastroscope [id number] endoscope was introduced through the mouth and advanced to the second portion of the duodenum , Without limitations.  The instrument was slowly withdrawn as the mucosa was fully examined. Estimated blood loss is zero unless otherwise noted in this procedure report.    Esophagus normal. GEJ 42 cm from the incisors and non-obstructing ring seen. Erythematous streaks in distal stomach c/w antral gastritis. Clean-based (8 mm diameter) duodenal bulb ulcer seen with surrounding edema and erythema. 2nd portion of the duodenum normal and clear bilious fluid seen. Stomach mucosa o/w normal.       Retroflexed views revealed a small hiatal hernia. The scope was then withdrawn from the patient and the procedure completed.  COMPLICATIONS: There were no immediate complications.  ENDOSCOPIC IMPRESSION:     Duodenal bulb ulcer (clean-based) - see above Antral gastritis Small hiatal hernia  RECOMMENDATIONS:     IV PPI Q 12 hours; Avoid NSAIDs   eSigned:  Wilford Corner, MD May 17, 2015 10:17 AM    CC:  CPT CODES: ICD CODES:  The ICD and CPT codes recommended by this software are interpretations from the data that the clinical staff has captured with the software.  The verification of the translation of this report to the ICD and CPT codes and modifiers is the  sole responsibility of the health care institution and practicing physician where this report was generated.  [employer]. will not be held responsible for the validity of the ICD and CPT codes included on this report.  AMA assumes no liability for data contained or not contained herein. CPT is a Designer, television/film set of the [employer].  PATIENT NAME:  Kyle, Conrad MR#: [id number]

## 2015-05-01 NOTE — Addendum Note (Signed)
Addendum  created 05/01/15 1121 by Inda Coke, CRNA   Modules edited: Anesthesia Medication Administration

## 2015-05-01 NOTE — Interval H&P Note (Signed)
History and Physical Interval Note:  05/01/2015 9:14 AM  Kyle Conrad  has presented today for surgery, with the diagnosis of Anemia  The various methods of treatment have been discussed with the patient and family. After consideration of risks, benefits and other options for treatment, the patient has consented to  Procedure(s): ESOPHAGOGASTRODUODENOSCOPY (EGD) WITH PROPOFOL (N/A) COLONOSCOPY WITH PROPOFOL (N/A) as a surgical intervention .  The patient's history has been reviewed, patient examined, no change in status, stable for surgery.  I have reviewed the patient's chart and labs.  Questions were answered to the patient's satisfaction.     Hildreth C.

## 2015-05-01 NOTE — Progress Notes (Signed)
PT Cancellation Note  Patient Details Name: Kyle Conrad MRN: [id number] DOB: 1966-01-31   Cancelled Treatment:    Reason Eval/Treat Not Completed: Fatigue/lethargy limiting ability to participate   Pt was unable to keep his eyes open when I was talking with him and he stated he would not be willing to work with PT at this time "maybe later".  I will attempt to return later as my schedule allows.  Would recommend pt continue to mobilize with nursing staff as able to increase likelihood of safe DC home when he is medically stable.     [name] 05/01/2015, 2:14 PM Lavonia Dana, [place]  [phone] 05/01/2015

## 2015-05-01 NOTE — Anesthesia Preprocedure Evaluation (Addendum)
Anesthesia Evaluation  Patient identified by MRN, date of birth, ID band Patient awake    Reviewed: Allergy & Precautions, NPO status , Patient's Chart, lab work & pertinent test results  Airway Mallampati: III  TM Distance: >3 FB Neck ROM: Full    Dental  (+) Teeth Intact, Dental Advisory Given   Pulmonary shortness of breath, sleep apnea and Continuous Positive Airway Pressure Ventilation ,    Pulmonary exam normal breath sounds clear to auscultation       Cardiovascular Exercise Tolerance: Poor hypertension, Pt. on medications (-) angina+CHF  (-) CAD and (-) Past MI Normal cardiovascular exam Rhythm:Regular Rate:Normal  TTE 12/9: EF 15%, severe LV dilation with moderate LVH, moderately decreased RV systolic function, moderate-severe MR likely due to annular dilatation. Prior echo in 2010 had showed normal systolic function. Likely low output with co-ox 54% initially.  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  Reproductive/Obstetrics                        Anesthesia Physical Anesthesia Plan  ASA: IV  Anesthesia Plan: MAC   Post-op Pain Management:    Induction: Intravenous  Airway Management Planned: Nasal Cannula  Additional Equipment:   Intra-op Plan:   Post-operative Plan:   Informed Consent: I have reviewed the patients History and Physical,  chart, labs and discussed the procedure including the risks, benefits and alternatives for the proposed anesthesia with the patient or authorized representative who has indicated his/her understanding and acceptance.   Dental advisory given  Plan Discussed with: CRNA and Anesthesiologist  Anesthesia Plan Comments: (Discussed risks/benefits/alternatives to MAC sedation including need for ventilatory support, hypotension, need for conversion to general anesthesia.  All patient questions answered.  Patient wished to proceed.)        Anesthesia Quick Evaluation

## 2015-05-01 NOTE — Transfer of Care (Signed)
Immediate Anesthesia Transfer of Care Note  Patient: Kyle Conrad  Procedure(s) Performed: Procedure(s): ESOPHAGOGASTRODUODENOSCOPY (EGD) WITH PROPOFOL (N/A) COLONOSCOPY WITH PROPOFOL (N/A)  Patient Location: PACU  Anesthesia Type:MAC  Level of Consciousness: awake  Airway & Oxygen Therapy: Patient Spontanous Breathing  Post-op Assessment: Report given to RN, Post -op Vital signs reviewed and stable and Patient moving all extremities X 4  Post vital signs: stable  Last Vitals:  Filed Vitals:   05/01/15 0826 05/01/15 1008  BP: 140/100 89/41  Pulse: 99 81  Temp: 36.7 C   Resp: 20     Complications: No apparent anesthesia complications

## 2015-05-01 NOTE — Progress Notes (Addendum)
[facility] PROGRESS NOTE  Kyle Conrad [id number] DOB: 1965/08/06 DOA: 04/22/2015 PCP: No PCP Per Patient  Length of stay: 9   Assessment/Plan: Principal Problem:   Acute congestive heart failure ([place]) Active Problems:   Obstructive sleep apnea   HYPERTENSION, BENIGN   Anemia   Anasarca   Acute on chronic systolic heart failure ([place])    1. Acute systolic CHF:  [id number]  LVEF 10-15% with restrictive diastolic filling pattern and moderate right ventricular dysfunction. He has moderate to severe mitral regurgitation as well likely related to annular dilatation. PASP 48 mmHg. Out 2 L x 24 hrs . He has had a marked diuresis 90 lbs Continue diuresis per cardiology, Currently on Lasix 40 mg by mouth daily, hydralazine, Imdur, digoxin, Aldactone Cardiology   Started  patient on Coreg 3.125 mg twice a day PICC line placed to measure Co-ox level on 12/10 Cardiac cath completed,EF 30% on cardiac cath, no significant coronary artery disease Monitor 1 more day due to low blood pressure, check orthostatic vitals  Hypokalemia and hypomagnesemia Replete  2. HTN:  Hypertensive at admission. Patient had angioedema with lisinopril.  Now on IV Lasix,PO hydralazine, Coreg, cardiology did titrate medications based on 2-D echo   3. Microcytic Anemia and positive fecal occult blood:  Without gross bleeding or melena., But patient endorses dark stools for the last 2-3 weeks Intermittent NSAID use+. Presented with a hemoglobin of 7.9> 8.1 > 8.0>8.3 >8.7  after one unit of blood, INR 1.86, status post vitamin K 1.50 Continue patient on Protonix currently no active bleeding, status post EGD/colonoscopy by Dr. Michail Sermon, Small duodenal ulcer (nonbleeding). Mild antral gastritis. Left-sided colonic diverticulosis. Soft diet and advance as tolerated. See endopro note for details/recs. Dr. Penelope Coop available to see this weekend if needed.  Serum iron 14,  received ferrous gluconate  12/9 Follow CBC, okay to discharge from a GI standpoint, hemoglobin stable   4. Hypoalbuminemia:  Patient anasarcic, but urine dipstick negative. Creatinine normal. -Urine protein/creatinine ratio, UA negative for protein Concern for amyloidosis  SPEP/UPEP with hypoalbuminemia. No apparent evidence of monoclonal protein. No M spike observed   5. Right upper extremity swelling  venous  Doppler negative for DVT  , now has a PICC line   6. Coagulopathy, cirrhosis?  CT of the abdomen and pelvis shows ascites, ammonia level within normal limits  . Patient may need paracentesis. GI to make further recommendations. Acute hepatitis panel negative      DVT prophylaxsis scd's   Code Status:      Code Status Orders        Start     Ordered   04/22/15 2130  Full code   Continuous     04/22/15 2129      Family Communication: Discussed in detail with the patient, all imaging results, lab results explained to the patient   Disposition Plan:  final disposition per cardiology, PT recommendations, transfer to telemetry when stable   Brief narrative:  49 y.o. male with a past medical history significant for HTN and NICM with episode of acute CHF in 2010 who presents with several months worsening dyspnea on exertion and weight gain.  The patient had an episode of congestive heart failure around 2010, had clean coronaries on cath with an EF of 20%, was treated heart failure clinic it appears for about one year, his EF returned around 60%, and then was lost to follow-up.  The patient reports to me that he was "all  better" since then and so he didn't follow up with heart failure clinic or find a PCP. He was working as an Optometrist, and physically active (biking 30 miles twice a week and going to the gym). Around October of this year he lost his job, and it was in the context that he started to notice worsening shortness of breath.  Since that time, the patient has had progressive shortness  of breath with exertion, increased swelling first in the ankles and legs, belly, arms, and scrotum, return of orthopnea, paroxysmal nocturnal dyspnea (he describes this as "feeling like my chest gets heavy when I lay down, and then it all filters out when I sit back up"). In the last week he has been having apple cider vinegar as a diuretic, but his symptoms progressed so he came to the ER.  In the ED, the patient was tachycardic, had a BNP 2601 pg per male, and new cardiomegaly on chest x-ray. His ECG showed sinus tachycardia. Of note the patient's hemoglobin was 7.9 g/dL and he was fecal occult blood positive. He denied hematemesis, melena, or frank bleeding from the rectum. He notes taking 1-2 aspirin or naproxen daily for a while given knee pain with his excess water weight.  Consultants:  GI  Cardiology   Procedures:  None   Antibiotics: Anti-infectives    None         HPI/Subjective: Blood pressure soft, lightheaded, CVP 4-5  Objective: Filed Vitals:   05/01/15 1008 05/01/15 1010 05/01/15 1020 05/01/15 1030  BP: 89/41 88/42 94/64  101/66  Pulse: 81 79 74 81  Temp:  97.6 F (36.4 C)    TempSrc:  Oral    Resp:  26 21 14   Height:      Weight:      SpO2: 96% 95% 96% 98%    Intake/Output Summary (Last 24 hours) at 05/01/15 1131 Last data filed at 05/01/15 0956  Gross per 24 hour  Intake    550 ml  Output   1350 ml  Net   -800 ml    Exam:  General: NAD Neck: JVP 14-16 cm, no thyromegaly or thyroid nodule.  Lungs: Clear to auscultation bilaterally with normal respiratory effort. CV: Lateral PMI. Heart regular S1/S2, no S3/S4, 2/6 HSM LLSB/apex. 1+ chronic edema to knees bilaterally.  Abdomen: Soft, nontender, no hepatosplenomegaly, mild distention.  Neurologic: Alert and oriented x 3.  Psych: Normal affect. Extremities: No clubbing or cyanosis. Edematous right arm.   Data Review   Micro Results Recent Results (from the past 240 hour(s))  MRSA PCR  Screening     Status: None   Collection Time: 04/25/15  1:51 PM  Result Value Ref Range Status   MRSA by PCR NEGATIVE NEGATIVE Final    Comment:        The GeneXpert MRSA Assay (FDA approved for NASAL specimens only), is one component of a comprehensive MRSA colonization surveillance program. It is not intended to diagnose MRSA infection nor to guide or monitor treatment for MRSA infections.     Radiology Reports Dg Chest 2 View  04/22/2015  CLINICAL DATA:  Two week history of cough, congestion, weakness and upper extremity swelling. EXAM: CHEST  2 VIEW COMPARISON:  05/21/2009 FINDINGS: The heart is enlarged and somewhat accentuated by the AP projection. Given the shape of the heart could not exclude a pericardial effusion. There is mild tortuosity of the thoracic aorta. The lungs are clear. No pleural effusion. The bony thorax is grossly intact. IMPRESSION:  Cardiac enlargement which represents a change since the prior study. Could not exclude a pericardial effusion. No acute pulmonary findings. Electronically Signed   By: Marijo Sanes M.D.   On: 04/22/2015 18:00   Ct Chest W Contrast  04/23/2015  CLINICAL DATA:  Acute shortness of breath and weight gain EXAM: CT CHEST, ABDOMEN, AND PELVIS WITH CONTRAST TECHNIQUE: Multidetector CT imaging of the chest, abdomen and pelvis was performed following the standard protocol during bolus administration of intravenous contrast. CONTRAST:  138mL OMNIPAQUE IOHEXOL 300 MG/ML  SOLN COMPARISON:  None. FINDINGS: CT CHEST FINDINGS Mediastinum/Nodes: Thoracic aorta is within normal limits. Pulmonary artery as visualized shows no focal abnormality. Cardiac shadow is enlarged. No pericardial effusion is seen. No significant hilar or mediastinal adenopathy is noted. Lungs/Pleura: Moderate right-sided pleural effusion is noted. Patchy atelectatic changes are seen. No focal confluent infiltrate is noted. Musculoskeletal: No acute bony abnormality is noted. General  anasarca in the soft tissues of the chest is noted. CT ABDOMEN PELVIS FINDINGS Hepatobiliary: The liver and gallbladder appear within normal limits. Pancreas: No acute abnormality noted. Spleen: No acute abnormality noted. Adrenals/Urinary Tract: Scattered cystic changes are noted within the left kidney. The adrenal glands appear within normal limits. Bladder is well distended. Stomach/Bowel: Fecal material is noted throughout the colon. Diverticular change is seen without definitive diverticulitis. No free air is noted. The appendix is not well appreciated Vascular/Lymphatic: Vascular structures appear within normal limits. Other: Mild ascites is identified. Generalized changes of anasarca are noted in the abdominal wall and pelvic soft tissues. There is a left inguinal hernia containing ascites. Musculoskeletal: No acute bony abnormality is noted. IMPRESSION: Changes of ascites and anasarca as well as a right-sided pleural effusion. These changes may be related to underlying hepatic failure. Correlation with laboratory values is recommended. Mild atelectatic changes are noted bilaterally. Cystic changes in the left kidney Electronically Signed   By: Inez Catalina M.D.   On: 04/23/2015 19:43   Ct Abdomen Pelvis W Contrast  04/23/2015  CLINICAL DATA:  Acute shortness of breath and weight gain EXAM: CT CHEST, ABDOMEN, AND PELVIS WITH CONTRAST TECHNIQUE: Multidetector CT imaging of the chest, abdomen and pelvis was performed following the standard protocol during bolus administration of intravenous contrast. CONTRAST:  115mL OMNIPAQUE IOHEXOL 300 MG/ML  SOLN COMPARISON:  None. FINDINGS: CT CHEST FINDINGS Mediastinum/Nodes: Thoracic aorta is within normal limits. Pulmonary artery as visualized shows no focal abnormality. Cardiac shadow is enlarged. No pericardial effusion is seen. No significant hilar or mediastinal adenopathy is noted. Lungs/Pleura: Moderate right-sided pleural effusion is noted. Patchy atelectatic  changes are seen. No focal confluent infiltrate is noted. Musculoskeletal: No acute bony abnormality is noted. General anasarca in the soft tissues of the chest is noted. CT ABDOMEN PELVIS FINDINGS Hepatobiliary: The liver and gallbladder appear within normal limits. Pancreas: No acute abnormality noted. Spleen: No acute abnormality noted. Adrenals/Urinary Tract: Scattered cystic changes are noted within the left kidney. The adrenal glands appear within normal limits. Bladder is well distended. Stomach/Bowel: Fecal material is noted throughout the colon. Diverticular change is seen without definitive diverticulitis. No free air is noted. The appendix is not well appreciated Vascular/Lymphatic: Vascular structures appear within normal limits. Other: Mild ascites is identified. Generalized changes of anasarca are noted in the abdominal wall and pelvic soft tissues. There is a left inguinal hernia containing ascites. Musculoskeletal: No acute bony abnormality is noted. IMPRESSION: Changes of ascites and anasarca as well as a right-sided pleural effusion. These changes may be related to  underlying hepatic failure. Correlation with laboratory values is recommended. Mild atelectatic changes are noted bilaterally. Cystic changes in the left kidney Electronically Signed   By: Inez Catalina M.D.   On: 04/23/2015 19:43     CBC  Recent Labs Lab 04/28/15 0433 04/29/15 0430 04/30/15 0430 04/30/15 1245 05/01/15 0500  WBC 4.2 4.5 4.6 3.6* 4.9  HGB 8.9* 8.7* 8.7* 8.9* 9.4*  HCT 30.7* 29.3* 29.6* 30.3* 31.3*  PLT 374 347 349 320 336  MCV 77.5* 77.7* 78.5 78.7 79.2  MCH 22.5* 23.1* 23.1* 23.1* 23.8*  MCHC 29.0* 29.7* 29.4* 29.4* 30.0  RDW 21.2* 21.4* 21.8* 21.7* 21.7*    Chemistries   Recent Labs Lab 04/25/15 0247 04/25/15 1250 04/26/15 0500 04/26/15 0709 04/27/15 0516 04/28/15 0433 04/29/15 0430 04/30/15 0430 04/30/15 1245 05/01/15 0500  NA 139  --  138  --  134* 134* 134* 134*  --  134*  K 3.8   --  3.3*  --  3.4* 4.1 4.9 5.0  --  4.3  CL 106  --  103  --  96* 92* 96* 101  --  102  CO2 27  --  30  --  32 35* 32 29  --  26  GLUCOSE 89  --  111*  --  169* 89 80 74  --  81  BUN 10  --  13  --  12 10 10 11   --  10  CREATININE 1.12  --  1.16  --  1.10 1.16 1.10 1.05 0.92 0.89  CALCIUM 8.4*  --  8.1*  --  8.2* 8.6* 8.4* 8.3*  --  8.3*  MG  --  1.7  --  1.6*  --  1.6*  --   --   --   --   AST 21  --  22  --  21  --   --  29  --   --   ALT 17  --  14*  --  16*  --   --  20  --   --   ALKPHOS 83  --  81  --  87  --   --  86  --   --   BILITOT 2.0*  --  1.1  --  1.7*  --   --  1.2  --   --    ------------------------------------------------------------------------------------------------------------------ estimated creatinine clearance is 107.8 mL/min (by C-G formula based on Cr of 0.89). ------------------------------------------------------------------------------------------------------------------ No results for input(s): HGBA1C in the last 72 hours. ------------------------------------------------------------------------------------------------------------------ No results for input(s): CHOL, HDL, LDLCALC, TRIG, CHOLHDL, LDLDIRECT in the last 72 hours. ------------------------------------------------------------------------------------------------------------------ No results for input(s): TSH, T4TOTAL, T3FREE, THYROIDAB in the last 72 hours.  Invalid input(s): FREET3 ------------------------------------------------------------------------------------------------------------------ No results for input(s): VITAMINB12, FOLATE, FERRITIN, TIBC, IRON, RETICCTPCT in the last 72 hours.  Coagulation profile  Recent Labs Lab 04/25/15 0247 04/29/15 0430 04/30/15 0430  INR 1.84* 1.55* 1.50*    No results for input(s): DDIMER in the last 72 hours.  Cardiac Enzymes No results for input(s): CKMB, TROPONINI, MYOGLOBIN in the last 168 hours.  Invalid input(s):  CK ------------------------------------------------------------------------------------------------------------------ Invalid input(s): POCBNP   CBG: No results for input(s): GLUCAP in the last 168 hours.     Studies: No results found.    Lab Results  Component Value Date   HGBA1C  09/10/2008    6.0 (NOTE) The ADA recommends the following therapeutic goal for glycemic control related to Hgb A1c measurement: Goal of therapy: <6.5 Hgb A1c  Reference:  American Diabetes Association: Clinical Practice Recommendations 2010, Diabetes Care, 2010, 33: (Suppl  1).   Lab Results  Component Value Date   [facility]  09/08/2008    96        Total Cholesterol/HDL:CHD Risk Coronary Heart Disease Risk Table                     Men   Women  1/2 Average Risk   3.4   3.3  Average Risk       5.0   4.4  2 X Average Risk   9.6   7.1  3 X Average Risk  23.4   11.0        Use the calculated Patient Ratio above and the CHD Risk Table to determine the patient's CHD Risk.        ATP III CLASSIFICATION (LDL):  <100     mg/dL   Optimal  100-129  mg/dL   Near or Above                    Optimal  130-159  mg/dL   Borderline  160-189  mg/dL   High  >190     mg/dL   Very High   CREATININE 0.89 05/01/2015       Scheduled Meds: . carvedilol  3.125 mg Oral BID WC  . digoxin  0.125 mg Oral Daily  . furosemide  40 mg Oral Daily  . heparin  5,000 Units Subcutaneous 3 times per day  . hydrALAZINE  25 mg Oral 3 times per day  . isosorbide mononitrate  60 mg Oral Daily  . magnesium oxide  400 mg Oral BID  . pantoprazole (PROTONIX) IV  40 mg Intravenous Q12H  . sodium chloride  10-40 mL Intracatheter Q12H  . sodium chloride  3 mL Intravenous Q12H  . sodium chloride  3 mL Intravenous Q12H  . spironolactone  25 mg Oral Daily   Continuous Infusions: . sodium chloride 10 mL/hr at 04/24/15 1118  . sodium chloride Stopped (04/30/15 1600)    Principal Problem:   Acute congestive heart failure  ([facility]) Active Problems:   Obstructive sleep apnea   HYPERTENSION, BENIGN   Anemia   Anasarca   Acute on chronic systolic heart failure ([place])    Time spent: 45 minutes   [facility] Pager [phone]. If 7PM-7AM, please contact night-coverage at [email or web address], password [facility] 05/01/2015, 11:31 AM  LOS: 9 days

## 2015-05-01 NOTE — Progress Notes (Signed)
Patient ID: Kyle Conrad, male   DOB: 06-19-1965, 49 y.o.   MRN: [id number]   SUBJECTIVE: Continues to diurese on po lasix. Down another 2 lbs.   Still getting lightheaded.  Creatinine trending down.  CVP 4-5 but poor tracing. 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 digoxin  0.125 mg Oral Daily  . furosemide  40 mg Oral Daily  . heparin  5,000 Units Subcutaneous 3 times per day  . hydrALAZINE  25 mg Oral 3 times per day  . isosorbide mononitrate  60 mg Oral Daily  . magnesium oxide  400 mg Oral BID  . pantoprazole  40 mg Oral BID  . sodium chloride  10-40 mL Intracatheter Q12H  . sodium chloride  3 mL Intravenous Q12H  . sodium chloride  3 mL Intravenous Q12H  . spironolactone  25 mg Oral Daily   Continuous Infusions: . sodium chloride 10 mL/hr at 04/24/15 1118  . sodium chloride Stopped (04/30/15 1600)   PRN Meds:.sodium chloride, sodium chloride, acetaminophen, ondansetron (ZOFRAN) IV, sodium chloride, sodium chloride, sodium chloride    Filed Vitals:   04/30/15 1938 04/30/15 2352 05/01/15 0350 05/01/15 0600  BP: 105/81 118/88 131/103   Pulse:      Temp: 98.1 F (36.7 C) 97.5 F (36.4 C) 97.8 F (36.6 C)   TempSrc: Oral Oral Oral   Resp: 20 18 16    Height:      Weight:    197 lb 8 oz (89.585 kg)  SpO2: 98% 97% 98%     Intake/Output Summary (Last 24 hours) at 05/01/15 0729 Last data filed at 05/01/15 0400  Gross per 24 hour  Intake     53 ml  Output   1775 ml  Net  -1722 ml    LABS: Basic Metabolic Panel:  Recent Labs  04/30/15 0430  04/30/15 1245 05/01/15 0500  NA 134*  --  134*  K 5.0  --  4.3  CL 101  --  102  CO2 29  --  26  GLUCOSE 74  --  81  BUN 11  --  10  CREATININE 1.05 0.92 0.89  CALCIUM 8.3*  --  8.3*   Liver Function Tests:  Recent Labs  04/30/15 0430  AST 29  ALT 20  ALKPHOS 86  BILITOT 1.2  PROT 6.1*  ALBUMIN 2.0*   No results for input(s): LIPASE, AMYLASE in the last 72 hours. CBC:  Recent Labs  04/30/15 1245 05/01/15 0500  WBC 3.6* 4.9  HGB 8.9* 9.4*  HCT 30.3* 31.3*  MCV 78.7 79.2  PLT 320 336   Cardiac Enzymes: No results for input(s): CKTOTAL, CKMB, CKMBINDEX, TROPONINI in the last 72 hours. BNP: Invalid input(s): POCBNP D-Dimer: No results for input(s): DDIMER in the last 72 hours. Hemoglobin A1C: No results for input(s): HGBA1C in the last 72 hours. Fasting Lipid Panel: No results for input(s): CHOL, HDL, LDLCALC, TRIG, CHOLHDL, LDLDIRECT in the last 72 hours. Thyroid Function Tests: No results for input(s): TSH, T4TOTAL, T3FREE, THYROIDAB in the last 72 hours.  Invalid input(s): FREET3 Anemia Panel: No results for input(s): VITAMINB12,  FOLATE, FERRITIN, TIBC, IRON, RETICCTPCT in the last 72 hours.  RADIOLOGY: Dg Chest 2 View  04/22/2015  CLINICAL DATA:  Two week history of cough, congestion, weakness and upper extremity swelling. EXAM: CHEST  2 VIEW COMPARISON:  05/21/2009 FINDINGS: The heart is enlarged and somewhat accentuated by the AP projection. Given the shape of the heart could not exclude a pericardial effusion. There is mild tortuosity of the thoracic aorta. The lungs are clear. No pleural effusion. The bony thorax is grossly intact. IMPRESSION: Cardiac enlargement which represents a change since the prior study. Could not exclude a pericardial effusion. No acute pulmonary findings. Electronically Signed   By: Marijo Sanes M.D.   On: 04/22/2015 18:00   Ct Chest W Contrast  04/23/2015  CLINICAL DATA:  Acute shortness of breath and weight gain EXAM: CT  CHEST, ABDOMEN, AND PELVIS WITH CONTRAST TECHNIQUE: Multidetector CT imaging of the chest, abdomen and pelvis was performed following the standard protocol during bolus administration of intravenous contrast. CONTRAST:  16mL OMNIPAQUE IOHEXOL 300 MG/ML  SOLN COMPARISON:  None. FINDINGS: CT CHEST FINDINGS Mediastinum/Nodes: Thoracic aorta is within normal limits. Pulmonary artery as visualized shows no focal abnormality. Cardiac shadow is enlarged. No pericardial effusion is seen. No significant hilar or mediastinal adenopathy is noted. Lungs/Pleura: Moderate right-sided pleural effusion is noted. Patchy atelectatic changes are seen. No focal confluent infiltrate is noted. Musculoskeletal: No acute bony abnormality is noted. General anasarca in the soft tissues of the chest is noted. CT ABDOMEN PELVIS FINDINGS Hepatobiliary: The liver and gallbladder appear within normal limits. Pancreas: No acute abnormality noted. Spleen: No acute abnormality noted. Adrenals/Urinary Tract: Scattered cystic changes are noted within the left kidney. The adrenal glands appear within normal limits. Bladder is well distended. Stomach/Bowel: Fecal material is noted throughout the colon. Diverticular change is seen without definitive diverticulitis. No free air is noted. The appendix is not well appreciated Vascular/Lymphatic: Vascular structures appear within normal limits. Other: Mild ascites is identified. Generalized changes of anasarca are noted in the abdominal wall and pelvic soft tissues. There is a left inguinal hernia containing ascites. Musculoskeletal: No acute bony abnormality is noted. IMPRESSION: Changes of ascites and anasarca as well as a right-sided pleural effusion. These changes may be related to underlying hepatic failure. Correlation with laboratory values is recommended. Mild atelectatic changes are noted bilaterally. Cystic changes in the left kidney Electronically Signed   By: Inez Catalina M.D.   On: 04/23/2015  19:43   Ct Abdomen Pelvis W Contrast  04/23/2015  CLINICAL DATA:  Acute shortness of breath and weight gain EXAM: CT CHEST, ABDOMEN, AND PELVIS WITH CONTRAST TECHNIQUE: Multidetector CT imaging of the chest, abdomen and pelvis was performed following the standard protocol during bolus administration of intravenous contrast. CONTRAST:  11mL OMNIPAQUE IOHEXOL 300 MG/ML  SOLN COMPARISON:  None. FINDINGS: CT CHEST FINDINGS Mediastinum/Nodes: Thoracic aorta is within normal limits. Pulmonary artery as visualized shows no focal abnormality. Cardiac shadow is enlarged. No pericardial effusion is seen. No significant hilar or mediastinal adenopathy is noted. Lungs/Pleura: Moderate right-sided pleural effusion is noted. Patchy atelectatic changes are seen. No focal confluent infiltrate is noted. Musculoskeletal: No acute bony abnormality is noted. General anasarca in the soft tissues of the chest is noted. CT ABDOMEN PELVIS FINDINGS Hepatobiliary: The liver and gallbladder appear within normal limits. Pancreas: No acute abnormality noted. Spleen: No acute abnormality noted. Adrenals/Urinary Tract: Scattered cystic changes are noted within the left kidney. The adrenal glands appear within normal limits. Bladder is well  distended. Stomach/Bowel: Fecal material is noted throughout the colon. Diverticular change is seen without definitive diverticulitis. No free air is noted. The appendix is not well appreciated Vascular/Lymphatic: Vascular structures appear within normal limits. Other: Mild ascites is identified. Generalized changes of anasarca are noted in the abdominal wall and pelvic soft tissues. There is a left inguinal hernia containing ascites. Musculoskeletal: No acute bony abnormality is noted. IMPRESSION: Changes of ascites and anasarca as well as a right-sided pleural effusion. These changes may be related to underlying hepatic failure. Correlation with laboratory values is recommended. Mild atelectatic changes  are noted bilaterally. Cystic changes in the left kidney Electronically Signed   By: Inez Catalina M.D.   On: 04/23/2015 19:43    PHYSICAL EXAM General: NAD Neck: JVP does not appear elevated, no thyromegaly or nodule noted. Lungs: Clear, no resp distress CV: Lateral PMI.  Heart regular S1/S2, no S3/S4, 2/6 HSM LLSB/apex. Abdomen: Soft, non tender, non distended, no HSM. No bruits or masses. Bowel sounds present x4 Neurologic: Alert and oriented x 3.  Psych: Flat affect. Extremities: No clubbing or cyanosis. No edema. R arm slightly edematous  TELEMETRY: NSR 100s  ASSESSMENT AND PLAN: 49 yo with history of nonischemic cardiomyopathy in 2010 with full recovery also in 2010 presented with acute systolic CHF and also was noted to be anemic (Fe deficiency with FOBT+).  He had not seen a doctor or taken any meds for several years prior to admission.   1. Acute systolic CHF: Echo (Q000111Q) with EF 15%, severe LV dilation with moderate LVH, moderately decreased RV systolic function, moderate-severe MR likely due to annular dilatation.  Prior echo in 2010 had showed normal systolic function.  Likely low output with co-ox 54% initially.  Now stable off milrinone. - Stable/continues to diurese on po lasix 40 mg daily.  - Hydralazine lowered to 25 mg tid 04/30/15 with lightheadedness.  Continue Imdur, spironolactone, and digoxin.   - h/o angioedema with ACEI.   - RHC showed preserved cardiac output.  Will hold off on BB for now with recent low output.  - SPEP/UPEP with hypoalbuminemia. No M spike. - As above, [facility] 04/30/15 with no CAD and preserved cardiac output. Near normal filling pressures. 2. Anemia: Fe deficient with FOBT+.   - Hgb stable s/p 1 UPRBC 04/23/15. Hgb 9.4 this am.  - Received IV Fe this admission.   - To get EGD/Colon today.  3. Hypoalbuminemia:  -Stable.  4. ?Cirrhosis: - CT abdomen 04/23/15 Liver appeared normal.   - Elevated INR at baseline with ascites and hypoalbuminemia 5.  Asymmetric right arm swelling:  - [place] negative for DVT.     - Chest CT 04/23/15 without mass or any obstruction to venous/arterior flow from right arm.  - Stable.  6. Mobility - PT following. Recommend SNF but pt refuses. ? Rehab stay.   Shirley Friar PA-C 05/01/2015 7:29 AM   Advanced Heart Failure Team Pager [phone] (M-F; 7a - 4p)  Please contact [facility] for night-coverage after hours (4p -7a ) and weekends on [email or web address]   Patient seen with PA, agree with the above note.  Nonischemic cardiomyopathy, preserved cardiac output and good filling pressures by cath yesterday (no CAD).  Now off milrinone.   Lightheadedness still present but better today.  BP is stable.  He appears euvolemic and is on po Lasix.  Weight is down 90 lbs.  I would like to start him on Coreg 3.125 mg bid give good output.  Will need  to check orthostatics as well.   He had EGD today showing duodenal ulcer, nonbleeding.  May have been source of prior blood loss.   Loralie Champagne 05/01/2015 10:56 AM

## 2015-05-01 NOTE — Progress Notes (Signed)
Physical Therapy Treatment Patient Details Name: Kyle Conrad MRN: [id number] DOB: 06-06-1965 Today's Date: 05/01/2015    History of Present Illness pt is a 49 y/o male with h/o CHF, NICM, HTN, admitted with worsening dyspnea on exertion and weight gain. Greater than 80lb fluid loss while in hospital. Cardiac cath on 12/15.    PT Comments    Pt progressing well with PT. He does not want f/u therapy at home due to cost and does not want SNF placement. He feels he can manage at home alone.  He did not have any balance losses with gait today and seems to have a good understanding of his physical limitations and safety at home.  A cane swould likely be helpful to assist with his balance.  Pt was much more alert than he was when PT attempted earlier.  He states all his testing over the last 2 days has wore him out.   Follow Up Recommendations  No PT follow up;Supervision - Intermittent (Pt does not want HHPT due to cost)     Equipment Recommendations  Cane    Recommendations for Other Services       Precautions / Restrictions Precautions Precautions: Fall Restrictions Weight Bearing Restrictions: No    Mobility  Bed Mobility Overal bed mobility: Modified Independent Bed Mobility: Supine to Sit              Transfers Overall transfer level: Needs assistance Equipment used: Rolling walker (2 wheeled) Transfers: Sit to/from Stand Sit to Stand: Supervision         General transfer comment:  (uses arms to power up)  Ambulation/Gait Ambulation/Gait assistance: Min guard Ambulation Distance (Feet): 600 Feet (300 feet with RW and 300 with no device) Assistive device: Rolling walker (2 wheeled);None Gait Pattern/deviations: Step-through pattern;Decreased stride length;Wide base of support   Gait velocity interpretation: Below normal speed for age/gender General Gait Details: no balance losses with or without RW.  Pt states he feels like a cane would be helpful and would  like one for home.    Stairs            Wheelchair Mobility    Modified Rankin (Stroke Patients Only)       Balance   Sitting-balance support: No upper extremity supported Sitting balance-[name]: Good     Standing balance support: No upper extremity supported Standing balance-[name]: Good               High level balance activites: Backward walking;Direction changes;Turns High Level Balance Comments: performed balance testing in room. Pt does have some dificulty with tandem stance but no loss of balance with testing. Able to carrie a plate with a roll on it with one hand without dropping roll.     Cognition Arousal/Alertness: Awake/alert Behavior During Therapy: WFL for tasks assessed/performed Overall Cognitive Status: Within Functional Limits for tasks assessed                      Exercises      General Comments General comments (skin integrity, edema, etc.): pt states he feels like he can mamage at home and does not want SNF placement.      Pertinent Vitals/Pain Pain Assessment: Faces Faces Pain Scale: Hurts a little bit Pain Descriptors / Indicators: Grimacing;Sore Pain Intervention(s): Monitored during session;Limited activity within patient's tolerance    Home Living  Prior Function            PT Goals (current goals can now be found in the care plan section) Progress towards PT goals: Progressing toward goals    Frequency  Min 3X/week    PT Plan Discharge plan needs to be updated    Co-evaluation             End of Session Equipment Utilized During Treatment: Gait belt Activity Tolerance: Patient tolerated treatment well Patient left: in chair;with call bell/phone within reach     Time: 1550-1619 PT Time Calculation (min) (ACUTE ONLY): 29 min  Charges:  $Gait Training: 23-37 mins                    G Codes:      [name] 09-May-2015, 4:36 PM  Lavonia Dana, [place] May 09, 2015

## 2015-05-01 NOTE — H&P (View-Only) (Signed)
Patient ID: Kyle Conrad, male   DOB: 10/27/1965, 49 y.o.   MRN: [id number] [facility]ology Progress Note  Yorman Crase 49 y.o. 1965/11/24   Subjective: Resting comfortably. Denies abdominal pain.  Objective: Vital signs in last 24 hours: Filed Vitals:   04/30/15 1200 04/30/15 1215  BP: 96/69 105/75  Pulse: 81 78  Temp:    Resp: 24 11    Physical Exam: Gen: lethargic, no acute distress CV: RRR Chest: CTA anteriorly Abd: soft, nontender, nondistended, +BS Ext: no edema  Lab Results:  Recent Labs  04/28/15 0433 04/29/15 0430 04/30/15 0430  NA 134* 134* 134*  K 4.1 4.9 5.0  CL 92* 96* 101  CO2 35* 32 29  GLUCOSE 89 80 74  BUN 10 10 11   CREATININE 1.16 1.10 1.05  CALCIUM 8.6* 8.4* 8.3*  MG 1.6*  --   --     Recent Labs  04/30/15 0430  AST 29  ALT 20  ALKPHOS 86  BILITOT 1.2  PROT 6.1*  ALBUMIN 2.0*    Recent Labs  04/29/15 0430 04/30/15 0430  WBC 4.5 4.6  HGB 8.7* 8.7*  HCT 29.3* 29.6*  MCV 77.7* 78.5  PLT 347 349    Recent Labs  04/29/15 0430 04/30/15 0430  LABPROT 18.7* 18.2*  INR 1.55* 1.50*      Assessment/Plan: Anemia and heme positive stool in need of an inpt colon/EGD. S/P heart cath today. Severe CHF managed by cards. Colon prep this afternoon. EGD/Colon tomorrow with Diprivan sedation. Clear liquid diet. NPO p MN.   [place] C. 04/30/2015, 12:28 PM  Pager [phone]  If no answer or after 5 PM call [phone]

## 2015-05-02 LAB — CBC
HCT: 28.6 % — ABNORMAL LOW (ref 39.0–52.0)
Hemoglobin: 8.6 g/dL — ABNORMAL LOW (ref 13.0–17.0)
MCH: 23.8 pg — AB (ref 26.0–34.0)
MCHC: 30.1 g/dL (ref 30.0–36.0)
MCV: 79 fL (ref 78.0–100.0)
PLATELETS: 297 10*3/uL (ref 150–400)
RBC: 3.62 MIL/uL — AB (ref 4.22–5.81)
RDW: 21.5 % — AB (ref 11.5–15.5)
WBC: 6.5 10*3/uL (ref 4.0–10.5)

## 2015-05-02 LAB — BASIC METABOLIC PANEL
Anion gap: 4 — ABNORMAL LOW (ref 5–15)
BUN: 10 mg/dL (ref 6–20)
CALCIUM: 8.2 mg/dL — AB (ref 8.9–10.3)
CO2: 26 mmol/L (ref 22–32)
CREATININE: 0.93 mg/dL (ref 0.61–1.24)
Chloride: 104 mmol/L (ref 101–111)
GFR calc non Af Amer: >60 mL/min (ref >60)
Glucose, Bld: 89 mg/dL (ref 65–99)
Potassium: 4.2 mmol/L (ref 3.5–5.1)
SODIUM: 134 mmol/L — AB (ref 135–145)

## 2015-05-02 LAB — CARBOXYHEMOGLOBIN
CARBOXYHEMOGLOBIN: 1.7 % — AB (ref 0.5–1.5)
Methemoglobin: 0.5 % (ref 0.0–1.5)
O2 SAT: 76.6 %
Total hemoglobin: 7.4 g/dL — ABNORMAL LOW (ref 13.5–18.0)

## 2015-05-02 LAB — H. PYLORI ANTIBODY, IGG: H Pylori IgG: <0.9 U/mL (ref 0.0–0.8)

## 2015-05-02 MED ORDER — ISOSORBIDE MONONITRATE ER 60 MG PO TB24: 60.0000 mg | ORAL_TABLET | Freq: Every day | ORAL | Status: DC

## 2015-05-02 MED ORDER — PANTOPRAZOLE SODIUM 40 MG PO TBEC
40.0000 mg | DELAYED_RELEASE_TABLET | Freq: Two times a day (BID) | ORAL | Status: DC
  Administered 2015-05-02: 40 mg via ORAL
  Filled 2015-05-02: qty 1

## 2015-05-02 MED ORDER — FUROSEMIDE 40 MG PO TABS: 40.0000 mg | ORAL_TABLET | Freq: Every day | ORAL | Status: DC

## 2015-05-02 MED ORDER — CARVEDILOL 3.125 MG PO TABS: 3.1250 mg | ORAL_TABLET | Freq: Two times a day (BID) | ORAL | Status: DC

## 2015-05-02 MED ORDER — MAGNESIUM OXIDE 400 (241.3 MG) MG PO TABS
400.0000 mg | ORAL_TABLET | Freq: Two times a day (BID) | ORAL | Status: DC
Start: 2015-05-02 — End: 2015-09-22

## 2015-05-02 MED ORDER — SPIRONOLACTONE 25 MG PO TABS: 25.0000 mg | ORAL_TABLET | Freq: Every day | ORAL | Status: DC

## 2015-05-02 MED ORDER — DIGOXIN 125 MCG PO TABS: 0.1250 mg | ORAL_TABLET | Freq: Every day | ORAL | Status: DC

## 2015-05-02 MED ORDER — PANTOPRAZOLE SODIUM 40 MG PO TBEC: 40.0000 mg | DELAYED_RELEASE_TABLET | Freq: Two times a day (BID) | ORAL | Status: DC

## 2015-05-02 MED ORDER — PANTOPRAZOLE SODIUM 40 MG PO TBEC: 40.0000 mg | DELAYED_RELEASE_TABLET | Freq: Every day | ORAL | Status: DC

## 2015-05-02 MED ORDER — HYDRALAZINE HCL 25 MG PO TABS: 25.0000 mg | ORAL_TABLET | Freq: Three times a day (TID) | ORAL | Status: DC

## 2015-05-02 NOTE — Progress Notes (Signed)
[facility] PROGRESS NOTE  Kyle Conrad [id number] DOB: [birth year] DOA: 04/22/2015 PCP: No PCP Per Patient  Assessment/Plan: Principal Problem:  Acute congestive heart failure ([place]) Active Problems:  Obstructive sleep apnea  HYPERTENSION, BENIGN  Anemia  Anasarca  Acute on chronic systolic heart failure ([place])    1. Acute systolic CHF:  Q000111Q LVEF [id number] with restrictive diastolic filling pattern and moderate right ventricular dysfunction. He has moderate to severe mitral regurgitation as well likely related to annular dilatation. PASP 48 mmHg. -Patient diuresed well 90 lbs, clinically looks better. Currently on Lasix 40 mg by mouth daily, hydralazine, Imdur, digoxin, Aldactone, Cardiology Started patient on Coreg 3.125 mg twice a day -Cardiac cath completed,EF 30% on cardiac cath, no significant coronary artery disease  2. Hypokalemia and hypomagnesemia. Replete 3. HTN: Hypertensive at admission. Patient had angioedema with lisinopril. -cont hydralazine, Coreg, cardiology did titrate medications based on 2-D echo 4. Microcytic Anemia and positive fecal occult blood: without gross bleeding or melena. -Intermittent NSAID use+. Presented with a hemoglobin of 7.9> 8.1 > 8.0>8.3 >8.7 after one unit of blood, INR 1.86, status post vitamin K 1.50 -Pt underwent  EGD/colonoscopy by Dr. Michail Sermon, Small duodenal ulcer (nonbleeding). Mild antral gastritis. Left-sided colonic diverticulosis. received ferrous gluconate 12/9 5. Hypoalbuminemia: Patient anasarcic, but urine dipstick negative. Creatinine normal. Urine protein/creatinine ratio, UA negative for protein Concern for amyloidosis SPEP/UPEP with hypoalbuminemia. No apparent evidence of monoclonal protein. No M spike observed 6. Right upper extremity swellingvenous Doppler negative for DVT 7. Coagulopathy, cirrhosis? CT of the abdomen and pelvis shows ascites, ammonia level within normal limits . Patient may  need paracentesis in future. Acute hepatitis panel negative   Possible discharge home pend cardiology eval   Code Status: full Family Communication: d/w patient, RN (indicate person spoken with, relationship, and if by phone, the number) Disposition Plan: home pend cardiology clearance    Consultants:  Cardiology   GI  Procedures:  EGD/colon   Antibiotics:  none (indicate start date, and stop date if known)  HPI/Subjective: Alert. Oriented   Objective: Filed Vitals:   05/02/15 0333 05/02/15 0745  BP: 112/79 117/80  Pulse:  94  Temp: 98 F (36.7 C) 98.6 F (37 C)  Resp: 16 15    Intake/Output Summary (Last 24 hours) at 05/02/15 0835 Last data filed at 05/02/15 0700  Gross per 24 hour  Intake    740 ml  Output   1400 ml  Net   -660 ml   Filed Weights   04/30/15 0400 05/01/15 0600 05/02/15 0333  Weight: 90.674 kg (199 lb 14.4 oz) 89.585 kg (197 lb 8 oz) 89.041 kg (196 lb 4.8 oz)    Exam:   General:  Alert no distress   Cardiovascular: s1,s2 rrr  Respiratory: CTA BL  Abdomen: soft, nt,nd   Musculoskeletal: mil;d pedal edema    Data Reviewed: Basic Metabolic Panel:  Recent Labs Lab 04/25/15 1250  04/26/15 0709  04/28/15 0433 04/29/15 0430 04/30/15 0430 04/30/15 1245 05/01/15 0500 05/02/15 0430  NA  --   < >  --   < > 134* 134* 134*  --  134* 134*  K  --   < >  --   < > 4.1 4.9 5.0  --  4.3 4.2  CL  --   < >  --   < > 92* 96* 101  --  102 104  CO2  --   < >  --   < > 35* 32 29  --  26 26  GLUCOSE  --   < >  --   < > 89 80 74  --  81 89  BUN  --   < >  --   < > 10 10 11   --  10 10  CREATININE  --   < >  --   < > 1.16 1.10 1.05 0.92 0.89 0.93  CALCIUM  --   < >  --   < > 8.6* 8.4* 8.3*  --  8.3* 8.2*  MG 1.7  --  1.6*  --  1.6*  --   --   --   --   --   < > = values in this interval not displayed. Liver Function Tests:  Recent Labs Lab 04/26/15 0500 04/27/15 0516 04/30/15 0430  AST 22 21 29   ALT 14* 16* 20  ALKPHOS 81 87 86   BILITOT 1.1 1.7* 1.2  PROT 5.6* 5.9* 6.1*  ALBUMIN 1.8* 1.9* 2.0*   No results for input(s): LIPASE, AMYLASE in the last 168 hours. No results for input(s): AMMONIA in the last 168 hours. CBC:  Recent Labs Lab 04/29/15 0430 04/30/15 0430 04/30/15 1245 05/01/15 0500 05/02/15 0430  WBC 4.5 4.6 3.6* 4.9 6.5  HGB 8.7* 8.7* 8.9* 9.4* 8.6*  HCT 29.3* 29.6* 30.3* 31.3* 28.6*  MCV 77.7* 78.5 78.7 79.2 79.0  PLT 347 349 320 336 297   Cardiac Enzymes: No results for input(s): CKTOTAL, CKMB, CKMBINDEX, TROPONINI in the last 168 hours. BNP (last 3 results)  Recent Labs  04/22/15 1743  BNP 2601.0*    ProBNP (last 3 results) No results for input(s): PROBNP in the last 8760 hours.  CBG: No results for input(s): GLUCAP in the last 168 hours.  Recent Results (from the past 240 hour(s))  MRSA PCR Screening     Status: None   Collection Time: 04/25/15  1:51 PM  Result Value Ref Range Status   MRSA by PCR NEGATIVE NEGATIVE Final    Comment:        The GeneXpert MRSA Assay (FDA approved for NASAL specimens only), is one component of a comprehensive MRSA colonization surveillance program. It is not intended to diagnose MRSA infection nor to guide or monitor treatment for MRSA infections.      Studies: No results found.  Scheduled Meds: . carvedilol  3.125 mg Oral BID WC  . digoxin  0.125 mg Oral Daily  . furosemide  40 mg Oral Daily  . heparin  5,000 Units Subcutaneous 3 times per day  . hydrALAZINE  25 mg Oral 3 times per day  . isosorbide mononitrate  60 mg Oral Daily  . magnesium oxide  400 mg Oral BID  . pantoprazole (PROTONIX) IV  40 mg Intravenous Q12H  . sodium chloride  10-40 mL Intracatheter Q12H  . sodium chloride  3 mL Intravenous Q12H  . sodium chloride  3 mL Intravenous Q12H  . spironolactone  25 mg Oral Daily   Continuous Infusions: . sodium chloride 10 mL/hr at 04/24/15 1118  . sodium chloride Stopped (04/30/15 1600)    Principal Problem:    Acute congestive heart failure ([place]) Active Problems:   Obstructive sleep apnea   HYPERTENSION, BENIGN   Anemia   Anasarca   Acute on chronic systolic heart failure ([facility])    Time spent: >35 minutes     Kinnie Feil  [facility] Pager [phone]. If 7PM-7AM, please contact night-coverage at [email or web address], password [facility] 05/02/2015, 8:35 AM  LOS:  10 days

## 2015-05-02 NOTE — Progress Notes (Signed)
Pt discharge instructions reviewed with patient. Pt understands to call for appointment on Monday for Heart Failure and [place] .Medications reviewed with patient .I called [facility] to confirm prices of medications and that they will fill one day meds and pt can then get meds free at clinic on Monday. Pt is aware of this and agreeable. Picc line is now being discontinued by IV team. Pt was able to walk the unit without assistance or shortness of breath. Etta Quill

## 2015-05-02 NOTE — Discharge Summary (Signed)
Physician Discharge Summary  Kyle Conrad [id number] DOB: [birth year] DOA: 04/22/2015  PCP: No PCP Per Patient  Admit date: 04/22/2015 Discharge date: 05/02/2015  Time spent: >35 minutes  Recommendations for Outpatient Follow-up:  F/u with HF clinic F/u with PCP in 3-7 days as needed  Discharge Diagnoses:  Principal Problem:   Acute congestive heart failure ([place]) Active Problems:   Obstructive sleep apnea   HYPERTENSION, BENIGN   Anemia   Anasarca   Acute on chronic systolic heart failure [facility])   Discharge Condition: stable   Diet recommendation: low sodium   Filed Weights   04/30/15 0400 05/01/15 0600 05/02/15 0333  Weight: 90.674 kg (199 lb 14.4 oz) 89.585 kg (197 lb 8 oz) 89.041 kg (196 lb 4.8 oz)    History of present illness:  Principal Problem:  Acute congestive heart failure (HCC) Active Problems:  Obstructive sleep apnea  HYPERTENSION, BENIGN  Anemia  Anasarca  Acute on chronic systolic heart failure ([place])  49 y.o. male with a past medical history significant for HTN and NICM with episode of acute CHF in 2010 who presents with several months worsening dyspnea on exertion and weight gain.  The patient had an episode of congestive heart failure around 2010, had clean coronaries on cath with an EF of 20%, was treated heart failure clinic it appears for about one year, his EF returned around 60%, and then was lost to follow-up.  The patient reports to me that he was "all better" since then and so he didn't follow up with heart failure clinic or find a PCP. He was working as an Optometrist, and physically active (biking 30 miles twice a week and going to the gym). Around October of this year he lost his job, and it was in the context that he started to notice worsening shortness of breath.  Since that time, the patient has had progressive shortness of breath with exertion, increased swelling first in the ankles and legs, belly, arms, and scrotum, return of  orthopnea, paroxysmal nocturnal dyspnea (he describes this as "feeling like my chest gets heavy when I lay down, and then it all filters out when I sit back up"). In the last week he has been having apple cider vinegar as a diuretic, but his symptoms progressed so he came to the ER.   Hospital Course:  1. Acute systolic CHF:  [id number] [id number] with restrictive diastolic filling pattern and moderate right ventricular dysfunction. He has moderate to severe mitral regurgitation as well likely related to annular dilatation. PASP 48 mmHg. -Patient diuresed well 90 lbs, clinically looks better. Currently on Lasix 40 mg by mouth daily, hydralazine, Imdur, digoxin, Aldactone, Cardiology Started patient on Coreg 3.125 mg twice a day -Cardiac cath completed, EF 30% on cardiac cath, no significant coronary artery disease  2. Hypokalemia and hypomagnesemia. Replete 3. HTN: Hypertensive at admission. Patient had angioedema with lisinopril. -cont hydralazine, Coreg, cardiology did titrate medications based on 2-D echo 4. Microcytic Anemia and positive fecal occult blood: without gross bleeding or melena. -Intermittent NSAID use+. Presented with a hemoglobin of 7.9> 8.1 > 8.0>8.3 >8.7 after one unit of blood, INR 1.86, status post vitamin K 1.50 -Pt underwent EGD/colonoscopy by Dr. Michail Sermon, Small duodenal ulcer (nonbleeding). Mild antral gastritis.started on PPI.  Left-sided colonic diverticulosis. received ferrous gluconate 12/9.  5. Hypoalbuminemia: Patient anasarcic, but urine dipstick negative. Creatinine normal. Urine protein/creatinine ratio, UA negative for protein Concern for amyloidosis SPEP/UPEP with hypoalbuminemia. No apparent evidence of monoclonal protein. No M spike  observed 6. Right upper extremity swellingvenous Doppler negative for DVT 7. Coagulopathy, cirrhosis? CT of the abdomen and pelvis shows ascites, ammonia level within normal limits . Patient may need paracentesis in  future. Acute hepatitis panel negative  Procedures:  Echo: Impressions:  - Since the last exam in 2010 there have been significant changes: LV is severely dilated with severe systolic dysfunction and restrictive pattern of diastolic dysfunction. RV is moderately dilated with moderate systolic dysfunction. There is multivalvular regurgitation as a result of ventricular dilatation. This appears as end stage of hypertensive heart disease (moderate concentric LVH despite severe dilatation). CHF consult should be considered.  (i.e. Studies not automatically included, echos, thoracentesis, etc; not x-rays)  LHC 1. No significant coronary artery disease.  2. Near-normal left and right heart filling pressures. 3. Preserved cardiac output.  4. 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      Technique and Indications    Procedure: Right Heart Cath, Left Heart Cath, Selective Coronary Angiography, LV angiography  Indication: CHF, recently low output requiring milrinone, worsening EF.   Procedural Details: The right groin and right radial area were prepped, draped, and anesthetized with 1% lidocaine. Using the modified Seldinger technique a 7 French sheath was placed in the right femoral vein. A Swan-Ganz catheter was used for the right heart catheterization. Standard protocol was followed for recording of right heart pressures and sampling of oxygen saturations. Fick and thermodilution cardiac output was calculated. The right radial artery was entered using modified Seldinger technique and a 84F sheath was placed. The patient received 3 mg IA verapamil and weight-based IV heparin. Standard Judkins catheters were used for selective coronary angiography and left ventriculography. There were no immediate procedural complications. The patient was transferred to the post catheterization recovery area for further monitoring.Estimated blood  loss      Consultations:  Cardiology   Discharge Exam: Filed Vitals:   05/02/15 0333 05/02/15 0745  BP: 112/79 117/80  Pulse:  94  Temp: 98 F (36.7 C) 98.6 F (37 C)  Resp: 16 15    General: alert. No distress  Cardiovascular: s1,s2 rrr Respiratory: CTA BL  Discharge Instructions  Discharge Instructions    AMB Referral to HF Clinic    Complete by:  As directed      Diet - low sodium heart healthy    Complete by:  As directed      Discharge instructions    Complete by:  As directed   Please follow up with primary care doctor in 3-7 days     Increase activity slowly    Complete by:  As directed             Medication List    TAKE these medications        carvedilol 3.125 MG tablet  Commonly known as:  COREG  Take 1 tablet (3.125 mg total) by mouth 2 (two) times daily with a meal.     digoxin 0.125 MG tablet  Commonly known as:  LANOXIN  Take 1 tablet (0.125 mg total) by mouth daily.     furosemide 40 MG tablet  Commonly known as:  LASIX  Take 1 tablet (40 mg total) by mouth daily.     hydrALAZINE 25 MG tablet  Commonly known as:  APRESOLINE  Take 1 tablet (25 mg total) by mouth every 8 (eight) hours.     isosorbide mononitrate 60 MG 24 hr tablet  Commonly known as:  IMDUR  Take 1  tablet (60 mg total) by mouth daily.     magnesium oxide 400 (241.3 MG) MG tablet  Commonly known as:  MAG-OX  Take 1 tablet (400 mg total) by mouth 2 (two) times daily.     pantoprazole 40 MG tablet  Commonly known as:  PROTONIX  Take 1 tablet (40 mg total) by mouth 2 (two) times daily.     spironolactone 25 MG tablet  Commonly known as:  ALDACTONE  Take 1 tablet (25 mg total) by mouth daily.       Allergies  Allergen Reactions  . Lisinopril     REACTION: pt had a swelling episode.  His lips swelled  . Penicillins        Follow-up Information    Follow up with [facility] On 04/30/2015.   Why:  04/30/15 at 10:30am   Contact  information:   [address]  [place] [phone]      Follow up with Loralie Champagne, MD.   Specialty:  Cardiology   Why:  for post hospital follow up. Please bring all of your medications to your visit. The code for pt parking is 1000.   Contact information:   [address] [place] [address] [phone]        The results of significant diagnostics from this hospitalization (including imaging, microbiology, ancillary and laboratory) are listed below for reference.    Significant Diagnostic Studies: Dg Chest 2 View  04/22/2015  CLINICAL DATA:  Two week history of cough, congestion, weakness and upper extremity swelling. EXAM: CHEST  2 VIEW COMPARISON:  05/21/2009 FINDINGS: The heart is enlarged and somewhat accentuated by the AP projection. Given the shape of the heart could not exclude a pericardial effusion. There is mild tortuosity of the thoracic aorta. The lungs are clear. No pleural effusion. The bony thorax is grossly intact. IMPRESSION: Cardiac enlargement which represents a change since the prior study. Could not exclude a pericardial effusion. No acute pulmonary findings. Electronically Signed   By: Marijo Sanes M.D.   On: 04/22/2015 18:00   Ct Chest W Contrast  04/23/2015  CLINICAL DATA:  Acute shortness of breath and weight gain EXAM: CT CHEST, ABDOMEN, AND PELVIS WITH CONTRAST TECHNIQUE: Multidetector CT imaging of the chest, abdomen and pelvis was performed following the standard protocol during bolus administration of intravenous contrast. CONTRAST:  122mL OMNIPAQUE IOHEXOL 300 MG/ML  SOLN COMPARISON:  None. FINDINGS: CT CHEST FINDINGS Mediastinum/Nodes: Thoracic aorta is within normal limits. Pulmonary artery as visualized shows no focal abnormality. Cardiac shadow is enlarged. No pericardial effusion is seen. No significant hilar or mediastinal adenopathy is noted. Lungs/Pleura: Moderate right-sided pleural effusion is  noted. Patchy atelectatic changes are seen. No focal confluent infiltrate is noted. Musculoskeletal: No acute bony abnormality is noted. General anasarca in the soft tissues of the chest is noted. CT ABDOMEN PELVIS FINDINGS Hepatobiliary: The liver and gallbladder appear within normal limits. Pancreas: No acute abnormality noted. Spleen: No acute abnormality noted. Adrenals/Urinary Tract: Scattered cystic changes are noted within the left kidney. The adrenal glands appear within normal limits. Bladder is well distended. Stomach/Bowel: Fecal material is noted throughout the colon. Diverticular change is seen without definitive diverticulitis. No free air is noted. The appendix is not well appreciated Vascular/Lymphatic: Vascular structures appear within normal limits. Other: Mild ascites is identified. Generalized changes of anasarca are noted in the abdominal wall and pelvic soft tissues. There is a left inguinal hernia containing  ascites. Musculoskeletal: No acute bony abnormality is noted. IMPRESSION: Changes of ascites and anasarca as well as a right-sided pleural effusion. These changes may be related to underlying hepatic failure. Correlation with laboratory values is recommended. Mild atelectatic changes are noted bilaterally. Cystic changes in the left kidney Electronically Signed   By: Inez Catalina M.D.   On: 04/23/2015 19:43   Ct Abdomen Pelvis W Contrast  04/23/2015  CLINICAL DATA:  Acute shortness of breath and weight gain EXAM: CT CHEST, ABDOMEN, AND PELVIS WITH CONTRAST TECHNIQUE: Multidetector CT imaging of the chest, abdomen and pelvis was performed following the standard protocol during bolus administration of intravenous contrast. CONTRAST:  172mL OMNIPAQUE IOHEXOL 300 MG/ML  SOLN COMPARISON:  None. FINDINGS: CT CHEST FINDINGS Mediastinum/Nodes: Thoracic aorta is within normal limits. Pulmonary artery as visualized shows no focal abnormality. Cardiac shadow is enlarged. No pericardial effusion is  seen. No significant hilar or mediastinal adenopathy is noted. Lungs/Pleura: Moderate right-sided pleural effusion is noted. Patchy atelectatic changes are seen. No focal confluent infiltrate is noted. Musculoskeletal: No acute bony abnormality is noted. General anasarca in the soft tissues of the chest is noted. CT ABDOMEN PELVIS FINDINGS Hepatobiliary: The liver and gallbladder appear within normal limits. Pancreas: No acute abnormality noted. Spleen: No acute abnormality noted. Adrenals/Urinary Tract: Scattered cystic changes are noted within the left kidney. The adrenal glands appear within normal limits. Bladder is well distended. Stomach/Bowel: Fecal material is noted throughout the colon. Diverticular change is seen without definitive diverticulitis. No free air is noted. The appendix is not well appreciated Vascular/Lymphatic: Vascular structures appear within normal limits. Other: Mild ascites is identified. Generalized changes of anasarca are noted in the abdominal wall and pelvic soft tissues. There is a left inguinal hernia containing ascites. Musculoskeletal: No acute bony abnormality is noted. IMPRESSION: Changes of ascites and anasarca as well as a right-sided pleural effusion. These changes may be related to underlying hepatic failure. Correlation with laboratory values is recommended. Mild atelectatic changes are noted bilaterally. Cystic changes in the left kidney Electronically Signed   By: Inez Catalina M.D.   On: 04/23/2015 19:43    Microbiology: Recent Results (from the past 240 hour(s))  MRSA PCR Screening     Status: None   Collection Time: 04/25/15  1:51 PM  Result Value Ref Range Status   MRSA by PCR NEGATIVE NEGATIVE Final    Comment:        The GeneXpert MRSA Assay (FDA approved for NASAL specimens only), is one component of a comprehensive MRSA colonization surveillance program. It is not intended to diagnose MRSA infection nor to guide or monitor treatment for MRSA  infections.      Labs: Basic Metabolic Panel:  Recent Labs Lab 04/25/15 1250  04/26/15 0709  04/28/15 0433 04/29/15 0430 04/30/15 0430 04/30/15 1245 05/01/15 0500 05/02/15 0430  NA  --   < >  --   < > 134* 134* 134*  --  134* 134*  K  --   < >  --   < > 4.1 4.9 5.0  --  4.3 4.2  CL  --   < >  --   < > 92* 96* 101  --  102 104  CO2  --   < >  --   < > 35* 32 29  --  26 26  GLUCOSE  --   < >  --   < > 89 80 74  --  81 89  BUN  --   < >  --   < >  10 10 11   --  10 10  CREATININE  --   < >  --   < > 1.16 1.10 1.05 0.92 0.89 0.93  CALCIUM  --   < >  --   < > 8.6* 8.4* 8.3*  --  8.3* 8.2*  MG 1.7  --  1.6*  --  1.6*  --   --   --   --   --   < > = values in this interval not displayed. Liver Function Tests:  Recent Labs Lab 04/26/15 0500 04/27/15 0516 04/30/15 0430  AST 22 21 29   ALT 14* 16* 20  ALKPHOS 81 87 86  BILITOT 1.1 1.7* 1.2  PROT 5.6* 5.9* 6.1*  ALBUMIN 1.8* 1.9* 2.0*   No results for input(s): LIPASE, AMYLASE in the last 168 hours. No results for input(s): AMMONIA in the last 168 hours. CBC:  Recent Labs Lab 04/29/15 0430 04/30/15 0430 04/30/15 1245 05/01/15 0500 05/02/15 0430  WBC 4.5 4.6 3.6* 4.9 6.5  HGB 8.7* 8.7* 8.9* 9.4* 8.6*  HCT 29.3* 29.6* 30.3* 31.3* 28.6*  MCV 77.7* 78.5 78.7 79.2 79.0  PLT 347 349 320 336 297   Cardiac Enzymes: No results for input(s): CKTOTAL, CKMB, CKMBINDEX, TROPONINI in the last 168 hours. BNP: BNP (last 3 results)  Recent Labs  04/22/15 1743  BNP 2601.0*    ProBNP (last 3 results) No results for input(s): PROBNP in the last 8760 hours.  CBG: No results for input(s): GLUCAP in the last 168 hours.     SignedKinnie Feil  [facility] 05/02/2015, 11:05 AM

## 2015-05-02 NOTE — Progress Notes (Signed)
    SUBJECTIVE:  He feels good.     PHYSICAL EXAM Filed Vitals:   05/01/15 1919 05/01/15 2300 05/02/15 0333 05/02/15 0745  BP: 99/58 103/70 112/79 117/80  Pulse:    94  Temp: 98.6 F (37 C) 98.4 F (36.9 C) 98 F (36.7 C) 98.6 F (37 C)  TempSrc: Oral Oral Oral Oral  Resp: 13 20 16 15   Height:      Weight:   196 lb 4.8 oz (89.041 kg)   SpO2: 98% 98% 96% 98%   General:  No acute distress Lungs:  Clear Heart:  RRR Abdomen:   Positive bowel sounds, no rebound no guarding Extremities:  Mild edema.  LABS:  Results for orders placed or performed during the hospital encounter of 04/22/15 (from the past 24 hour(s))  Basic metabolic panel     Status: Abnormal   Collection Time: 05/02/15  4:30 AM  Result Value Ref Range   Sodium 134 (L) 135 - 145 mmol/L   Potassium 4.2 3.5 - 5.1 mmol/L   Chloride 104 101 - 111 mmol/L   CO2 26 22 - 32 mmol/L   Glucose, Bld 89 65 - 99 mg/dL   BUN 10 6 - 20 mg/dL   Creatinine, Ser 0.93 0.61 - 1.24 mg/dL   Calcium 8.2 (L) 8.9 - 10.3 mg/dL   GFR calc non Af Amer >60 >60 mL/min   GFR calc Af Amer >60 >60 mL/min   Anion gap 4 (L) 5 - 15  CBC     Status: Abnormal   Collection Time: 05/02/15  4:30 AM  Result Value Ref Range   WBC 6.5 4.0 - 10.5 K/uL   RBC 3.62 (L) 4.22 - 5.81 MIL/uL   Hemoglobin 8.6 (L) 13.0 - 17.0 g/dL   HCT 28.6 (L) 39.0 - 52.0 %   MCV 79.0 78.0 - 100.0 fL   MCH 23.8 (L) 26.0 - 34.0 pg   MCHC 30.1 30.0 - 36.0 g/dL   RDW 21.5 (H) 11.5 - 15.5 %   Platelets 297 150 - 400 K/uL  Carboxyhemoglobin     Status: Abnormal   Collection Time: 05/02/15  4:38 AM  Result Value Ref Range   Total hemoglobin 7.4 (L) 13.5 - 18.0 g/dL   O2 Saturation 76.6 %   Carboxyhemoglobin 1.7 (H) 0.5 - 1.5 %   Methemoglobin 0.5 0.0 - 1.5 %    Intake/Output Summary (Last 24 hours) at 05/02/15 1000 Last data filed at 05/02/15 0900  Gross per 24 hour  Intake    600 ml  Output   1400 ml  Net   -800 ml     ASSESSMENT AND PLAN:  ACUTE ON CHRONIC  CHF:  EF 15%.  Moderate severe MR and decreased RV systolic function.  Milrione has been weaned off.  Good CO on RHC.  No CAD on LHC.  CoOx 76.    Good UO.    Weight has come down nicely.  Meds were actually titrated down with hypotension.  I don't see any contraindication to going home from a cardiac standpoint.  We will need to make an appt for him and the office will call.        Kyle Conrad 05/02/2015 10:00 AM

## 2015-05-04 ENCOUNTER — Encounter (HOSPITAL_COMMUNITY): Payer: Self-pay | Admitting: Gastroenterology

## 2015-05-19 ENCOUNTER — Encounter (HOSPITAL_COMMUNITY): Payer: Self-pay

## 2015-05-19 ENCOUNTER — Ambulatory Visit (HOSPITAL_COMMUNITY)
Admission: RE | Admit: 2015-05-19 | Discharge: 2015-05-19 | Disposition: A | Discharge status: Home / Self Care | Payer: Self-pay | Source: Ambulatory Visit | Attending: Cardiology | Admitting: Cardiology

## 2015-05-19 VITALS — BP 106/70 | HR 91 | Wt 190.0 lb

## 2015-05-19 DIAGNOSIS — I5022 Chronic systolic (congestive) heart failure: Secondary | ICD-10-CM

## 2015-05-19 DIAGNOSIS — I10 Essential (primary) hypertension: Secondary | ICD-10-CM | POA: Insufficient documentation

## 2015-05-19 DIAGNOSIS — R601 Generalized edema: Secondary | ICD-10-CM | POA: Insufficient documentation

## 2015-05-19 DIAGNOSIS — E8809 Other disorders of plasma-protein metabolism, not elsewhere classified: Secondary | ICD-10-CM | POA: Insufficient documentation

## 2015-05-19 DIAGNOSIS — I429 Cardiomyopathy, unspecified: Secondary | ICD-10-CM | POA: Insufficient documentation

## 2015-05-19 DIAGNOSIS — Z79899 Other long term (current) drug therapy: Secondary | ICD-10-CM | POA: Insufficient documentation

## 2015-05-19 DIAGNOSIS — I5023 Acute on chronic systolic (congestive) heart failure: Secondary | ICD-10-CM | POA: Insufficient documentation

## 2015-05-19 DIAGNOSIS — K269 Duodenal ulcer, unspecified as acute or chronic, without hemorrhage or perforation: Secondary | ICD-10-CM | POA: Insufficient documentation

## 2015-05-19 DIAGNOSIS — D649 Anemia, unspecified: Secondary | ICD-10-CM | POA: Insufficient documentation

## 2015-05-19 LAB — BASIC METABOLIC PANEL
ANION GAP: 9 (ref 5–15)
BUN: 13 mg/dL (ref 6–20)
CALCIUM: 8.8 mg/dL — AB (ref 8.9–10.3)
CO2: 25 mmol/L (ref 22–32)
Chloride: 106 mmol/L (ref 101–111)
Creatinine, Ser: 0.78 mg/dL (ref 0.61–1.24)
GFR calc Af Amer: >60 mL/min (ref >60)
GLUCOSE: 87 mg/dL (ref 65–99)
Potassium: 4.1 mmol/L (ref 3.5–5.1)
SODIUM: 140 mmol/L (ref 135–145)

## 2015-05-19 LAB — BRAIN NATRIURETIC PEPTIDE: B Natriuretic Peptide: 1354.5 pg/mL — ABNORMAL HIGH (ref 0.0–100.0)

## 2015-05-19 MED ORDER — OMEPRAZOLE 20 MG PO TBEC: 20.0000 mg | DELAYED_RELEASE_TABLET | Freq: Every day | ORAL | Status: DC

## 2015-05-19 MED ORDER — CARVEDILOL 6.25 MG PO TABS: 3.1250 mg | ORAL_TABLET | Freq: Two times a day (BID) | ORAL | Status: DC

## 2015-05-19 MED ORDER — DIGOXIN 125 MCG PO TABS: 0.1250 mg | ORAL_TABLET | Freq: Every day | ORAL | Status: DC

## 2015-05-19 MED FILL — ?DIGITEK 125 MCG TABLET: 125 | 30 days supply | Qty: 30 | Fill #0

## 2015-05-19 MED FILL — CARVEDILOL 6.25 MG TABLET: 6.25 | 30 days supply | Qty: 30 | Fill #0

## 2015-05-19 MED FILL — ?OMEPRAZOLE DR 20 MG CAPSUL: 20 | 30 days supply | Qty: 30 | Fill #0

## 2015-05-19 NOTE — Patient Instructions (Signed)
INCREASE Carvedilol to 6.25mg  twice a day (new script sent to pharmacy)  TAKE Digoxin 0.125mg  daily.  TAKE 20mg  of Omeprazole daily.  PLEASE FOLLOW A 1500mg  SODIUM DIET.  Routine lab work today. (bmet bnp) Will notify you of abnormal results  FOLLOW UP : 1 month with Dr.Mclean  PLEASE COLLECT URINE IN CONTAINER GIVEN TO YOU FOR 24 hrs... PLEASE RETURN TO CLINIC.

## 2015-05-19 NOTE — Progress Notes (Signed)
Patient ID: Kyle Conrad, male   DOB: 1966/01/28, 50 y.o.   MRN: [id number] PCP: None Cardiology: Dr. Aundra Dubin  50 yo with history of nonischemic cardiomyopathy presents for cardiology followup. He was initially diagnosed with CHF in 2010.  He was admitted then and treated for CHF exacerbation, EF was low.  However, on repeat echo in 2010, EF was back to normal and he was lost to followup.  He was admitted in 12/16 with acute systolic CHF.  Dyspnea had gradually built up and he had anasarca.  Echo showed EF 10-15% with diffuse hypokinesis, moderate RV dysfunction, and moderate to severe MR.  Cardiac output was low and he was started on milrinone gtt and IV Lasix.  He was diuresed extensively and had coronary angiography showing no CAD.  He was weaned off milrinone and sent home.  During this stay, he was also noted to have iron deficiency anemia and FOBT+.  Colonscopy showed diverticulosis and EGD showed a nonbleeding duodenal ulcer.  He had hypoalbuminemia, elevated INR, and ascites but imaging of the liver was not suggestive of cirrhosis.   Since getting home, he has been doing well.  He is taking all his meds except digoxin and Protonix which were too expensive.  He can walk on flat ground without dyspnea.  No orthopnea/PND.  He has been riding and exercise bike.  He has not been particularly active since getting home.  Trying to follow low salt diet.  No chest pain, no palpitations, no lightheadedness.   Labs (12/16): K 4.2, creatinine 0.93, HCT 28.6, albumin 2, LFTs normal, SPEP negative, UPEP negative.   ECG: Sinus at 102 with poor R wave progression and low voltage, narrow QRS  PMH: 1. H/o angioedema with ACEI 2. Hypoalbuminemia 3. CHF: Nonischemic cardiomyopathy.  1st found in 2010.  Treated for cardiomyopathy and followup echo in 2010 showed recovery in EF to normal.  Admitted with acute systolic CHF in [id number]  Echo (12/16) with EF 10-15%, severe LV dilation, moderate-severe MR, RV moderately  dilated with moderately decreased systolic function.  LHC/RHC (12/16) with mean RA 6, PA 42/21 mean 33, mean PCWP 14, CI 5.1 Fick/3.6 thermo, PVR 2.84.  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  SPEP/UPEP negative.  4. Anemia: Fe-deficiency.  FOBT+.  Colonoscopy 12/16 with diverticulosis.  EGD with small, nonbleeding duodenal ulcer.   5. HTN 6. ?Cirrhosis: Elevated INR with ascites and hypoalbuminemia at 12/16 admission.  CT abdomen showed normal liver contour.   7. R>L arm edema with normal upper extremity venous dopplers in 12/16.   SH: Lives alone in [place].  No ETOH or smoking.    FH: No premature CAD, no CHF that he knows of.   ROS: All systems reviewed and negative except as per HPI.    Current Outpatient Prescriptions  Medication Sig Dispense Refill  . carvedilol (COREG) 6.25 MG tablet Take 0.5 tablets (3.125 mg total) by mouth 2 (two) times daily with a meal. 60 tablet 3  . furosemide (LASIX) 40 MG tablet Take 1 tablet (40 mg total) by mouth daily. 30 tablet 1  . hydrALAZINE (APRESOLINE) 25 MG tablet Take 1 tablet (25 mg total) by mouth every 8 (eight) hours. 90 tablet 1  . isosorbide mononitrate (IMDUR) 60 MG 24 hr tablet Take 1 tablet (60 mg total) by mouth daily. 30 tablet 2  . magnesium oxide (MAG-OX) 400 (241.3 MG) MG tablet Take 1 tablet (400 mg total) by mouth 2 (two) times daily. 60 tablet 1  .  spironolactone (ALDACTONE) 25 MG tablet Take 1 tablet (25 mg total) by mouth daily. 30 tablet 1  . digoxin (LANOXIN) 0.125 MG tablet Take 1 tablet (0.125 mg total) by mouth daily. 30 tablet 1  . Omeprazole 20 MG TBEC Take 1 tablet (20 mg total) by mouth daily. 30 each 3   No current facility-administered medications for this encounter.   BP 106/70 mmHg  Pulse 91  Wt 190 lb (86.183 kg)  SpO2 99% General: NAD Neck: JVP 8 cm, no thyromegaly or thyroid nodule.  Lungs: Clear to auscultation bilaterally with normal respiratory effort. CV: Nondisplaced PMI.   Heart regular S1/S2, no S3/S4, no murmur.  No peripheral edema.  No carotid bruit.  Normal pedal pulses.  Abdomen: Soft, nontender, no hepatosplenomegaly, no distention.  Skin: Intact without lesions or rashes.  Neurologic: Alert and oriented x 3.  Psych: Normal affect. Extremities: No clubbing or cyanosis.  HEENT: Normal.   Assessment/Plan: 1. Chronic systolic CHF: Nonischemic cardiomyopathy.  Echo with EF 10-15% and moderate RV dysfunction.  Coronary angiography 12/16 without significant CAD.  Cause uncertain.  He carries history of HTN but BP has not been high.  No family history of CHF.  SPEP/UPEP negative.  No evidence for pulmonary sarcoidosis on chest CT.  Possible viral myocarditis.  On exam today, he is not significantly volume overloaded.   - I will plan a cardiac MRI to assess for any evidence for infiltrative disease/myocarditis.   - He will need repeat echo in 6 months to decide on need for ICD.  - Restart digoxin 0.125 daily.  Pharmacist will help him find where this will be the cheapest.  - Increase Coreg to 6.25 mg bid.  - Continue current hydralazine/Imdur, spironolactone, and Lasix.  - Holding off on ACEI/ARB for now given angioedema with ACEI, may consider ARB in future.  - Continue low sodium diet. 2. Duodenal ulcer/anemia/FOBT+: He cannot afford Protonix, will have him start omeprazole.  3. Hypoalbuminemia: ?Etiology.  May be related to liver disease (decreased synthesis) but liver did not appear cirrhotic on CT.  I will arrange for 24 hour urine protein collection to look for nephrotic syndrome.  4. ?Cirrhosis: Some concern for cirrhosis based on low albumin, high INR, and ascites, but liver did not appear cirrhotic on CT.   Followup in 1 month  Loralie Champagne 05/19/2015

## 2015-05-28 ENCOUNTER — Encounter: Payer: Self-pay | Admitting: Family Medicine

## 2015-05-28 ENCOUNTER — Ambulatory Visit (INDEPENDENT_AMBULATORY_CARE_PROVIDER_SITE_OTHER): Payer: Self-pay | Admitting: Family Medicine

## 2015-05-28 VITALS — BP 113/75 | HR 88 | Temp 98.6°F | Resp 14 | Ht 72.0 in | Wt 189.0 lb

## 2015-05-28 DIAGNOSIS — Z23 Encounter for immunization: Secondary | ICD-10-CM

## 2015-05-28 DIAGNOSIS — I10 Essential (primary) hypertension: Secondary | ICD-10-CM

## 2015-05-28 DIAGNOSIS — D500 Iron deficiency anemia secondary to blood loss (chronic): Secondary | ICD-10-CM

## 2015-05-28 DIAGNOSIS — I5023 Acute on chronic systolic (congestive) heart failure: Secondary | ICD-10-CM

## 2015-05-28 LAB — CBC WITH DIFFERENTIAL/PLATELET
BASOS ABS: 0 10*3/uL (ref 0.0–0.1)
Basophils Relative: 1 % (ref 0–1)
EOS PCT: 5 % (ref 0–5)
Eosinophils Absolute: 0.2 10*3/uL (ref 0.0–0.7)
HEMATOCRIT: 30.1 % — AB (ref 39.0–52.0)
HEMOGLOBIN: 9.1 g/dL — AB (ref 13.0–17.0)
LYMPHS ABS: 1.4 10*3/uL (ref 0.7–4.0)
LYMPHS PCT: 35 % (ref 12–46)
MCH: 24.3 pg — ABNORMAL LOW (ref 26.0–34.0)
MCHC: 30.2 g/dL (ref 30.0–36.0)
MCV: 80.5 fL (ref 78.0–100.0)
MONO ABS: 0.5 10*3/uL (ref 0.1–1.0)
MPV: 8.1 fL — ABNORMAL LOW (ref 8.6–12.4)
Monocytes Relative: 13 % — ABNORMAL HIGH (ref 3–12)
NEUTROS ABS: 1.9 10*3/uL (ref 1.7–7.7)
Neutrophils Relative %: 46 % (ref 43–77)
Platelets: 325 10*3/uL (ref 150–400)
RBC: 3.74 MIL/uL — AB (ref 4.22–5.81)
RDW: 19.9 % — ABNORMAL HIGH (ref 11.5–15.5)
WBC: 4.1 10*3/uL (ref 4.0–10.5)

## 2015-05-28 LAB — POCT URINALYSIS DIP (DEVICE)
BILIRUBIN URINE: NEGATIVE
Glucose, UA: NEGATIVE mg/dL
HGB URINE DIPSTICK: NEGATIVE
Ketones, ur: NEGATIVE mg/dL
LEUKOCYTES UA: NEGATIVE
Nitrite: NEGATIVE
PH: 6 (ref 5.0–8.0)
Protein, ur: NEGATIVE mg/dL
SPECIFIC GRAVITY, URINE: 1.01 (ref 1.005–1.030)
Urobilinogen, UA: 0.2 mg/dL (ref 0.0–1.0)

## 2015-05-28 NOTE — Progress Notes (Signed)
Subjective:    Patient ID: Kyle Conrad, male    DOB: 03-28-1966, 50 y.o.   MRN: [id number]  HPI  Kyle Conrad, a 50 year old male with a history of systolic heart failure presents to establish care. He states that he previously had a primary provider due to lack of payer source. [name] was recently admitted to hospital on 04/22/2015 with worsening dyspnea on exertion and weight gain. He described admitting symptoms as  progressive shortness of breath with exertion, increased edema, and orthopnea. Patient has a history of CHF that was diagnosed in 2010. Current 2 D Echo showed EF 10-15% with diffuse hypokinesis, moderate RV dysfunction, and moderate to severe MR. Patient was diuresed while in the hospital. Patient was evaluated by Dr. Aundra Dubin, cardiologist on 05/19/2015. Patient had a colonoscopy while hospitalized due to iron deficiency anemia and a positive fecal occult blood. Colonoscopy revealed and non bleeding duodenal ulcer and diverticulosis.  He is not exercising and is adherent to a low sodium diet.  He does not check blood pressures at home. Patient denies chest pain, dyspnea, irregular heart beat, lower extremity edema, near-syncope, orthopnea, syncope and tachypnea.   Past Medical History  Diagnosis Date  . CHF (congestive heart failure) ([place])   . Hypertension    Allergies  Allergen Reactions  . Lisinopril     REACTION: pt had a swelling episode.  His lips swelled  . Penicillins    Immunization History  Administered Date(s) Administered  . Pneumococcal Polysaccharide-23 05/28/2015  . Tdap 05/28/2015   Social History   Social History  . Marital Status: Single    Spouse Name: N/A  . Number of Children: N/A  . Years of Education: N/A   Occupational History  . Not on file.   Social History Main Topics  . Smoking status: Never Smoker   . Smokeless tobacco: Not on file  . Alcohol Use: No  . Drug Use: No  . Sexual Activity: Not on file   Other Topics  Concern  . Not on file   Social History Narrative   Past Surgical History  Procedure Laterality Date  . Shoulder surgery    . Cardiac catheterization N/A 04/30/2015    Procedure: Right/Left Heart Cath and Coronary Angiography;  Surgeon: Larey Dresser, MD;  Location: [facility];  Service: Cardiovascular;  Laterality: N/A;  . Esophagogastroduodenoscopy (egd) with propofol N/A 05/01/2015    Procedure: ESOPHAGOGASTRODUODENOSCOPY (EGD) WITH PROPOFOL;  Surgeon: Wilford Corner, MD;  Location: [facility] ENDOSCOPY;  Service: Endoscopy;  Laterality: N/A;  . Colonoscopy with propofol N/A 05/01/2015    Procedure: COLONOSCOPY WITH PROPOFOL;  Surgeon: Wilford Corner, MD;  Location: [facility] ENDOSCOPY;  Service: Endoscopy;  Laterality: N/A;   Review of Systems  Constitutional: Negative for fatigue and unexpected weight change.  HENT: Negative.   Eyes: Negative.  Negative for visual disturbance.  Respiratory: Negative.   Cardiovascular: Negative.  Negative for chest pain, palpitations and leg swelling.  Gastrointestinal: Negative.   Endocrine: Negative.  Negative for polydipsia, polyphagia and polyuria.  Genitourinary: Negative for dysuria.  Musculoskeletal: Negative.   Skin: Negative.   Allergic/Immunologic: Negative.   Neurological: Negative.  Negative for weakness.  Hematological: Negative.   Psychiatric/Behavioral: Negative.        Objective:   Physical Exam  Constitutional: He is oriented to person, place, and time. He appears well-developed and well-nourished.  HENT:  Head: Normocephalic and atraumatic.  Right Ear: External ear normal.  Left Ear: External ear normal.  Nose: Nose normal.  Mouth/Throat: Oropharynx is clear and moist.  Eyes: Conjunctivae and EOM are normal. Pupils are equal, round, and reactive to light.  Neck: Normal range of motion. Neck supple.  Cardiovascular: Normal rate, regular rhythm, normal heart sounds and intact distal pulses.   Pulses:      Carotid  pulses are 2+ on the right side, and 2+ on the left side.      Radial pulses are 2+ on the right side, and 2+ on the left side.  Pulmonary/Chest: Effort normal and breath sounds normal.  Abdominal: Soft. Bowel sounds are normal.  Musculoskeletal: Normal range of motion.  Neurological: He is alert and oriented to person, place, and time. He has normal reflexes.  Skin: Skin is warm and dry.  Psychiatric: He has a normal mood and affect. His behavior is normal. Judgment and thought content normal.      BP 113/75 mmHg  Pulse 88  Temp(Src) 98.6 F (37 C) (Oral)  Resp 14  Ht 6' (1.829 m)  Wt 189 lb (85.73 kg)  BMI 25.63 kg/m2 Assessment & Plan:   1. Acute on chronic systolic heart failure (HCC) Reviewed echocardiogram, EF 10-15 %. Patient is to follow-up with Dr. Aundra Dubin, cardiology in 1 month as scheduled. Dr. Aundra Dubin reviewed patient's medication regimen on 05/18/2014; will continue as prescribed.   2. Need for Tdap vaccination  - Tdap vaccine greater than or equal to 7yo IM  3. Immunization due  - Pneumococcal polysaccharide vaccine 23-valent greater than or equal to 2yo subcutaneous/IM  4. HYPERTENSION, BENIGN Blood pressure at goal on current medication regimen - POCT urinalysis dipstick  5. Iron deficiency anemia due to chronic blood loss Reviewed previous CBC, hemoglobin 7.9, will repeat on today - CBC with Differential    Routine Health Maintenance:  Recent colonoscopy Recommend annual eye examination Recommend digital rectal examination and PSA on follow up.     RTC: 3 months for DRE, and PSA Dorena Dew, FNP

## 2015-05-28 NOTE — Patient Instructions (Addendum)
DASH Eating Plan  DASH stands for "Dietary Approaches to Stop Hypertension." The DASH eating plan is a healthy eating plan that has been shown to reduce high blood pressure (hypertension). Additional health benefits may include reducing the risk of type 2 diabetes mellitus, heart disease, and stroke. The DASH eating plan may also help with weight loss.  WHAT DO I NEED TO KNOW ABOUT THE DASH EATING PLAN?  For the DASH eating plan, you will follow these general guidelines:  · Choose foods with a percent daily value for sodium of less than 5% (as listed on the food label).  · Use salt-free seasonings or herbs instead of table salt or sea salt.  · Check with your health care provider or pharmacist before using salt substitutes.  · Eat lower-sodium products, often labeled as "lower sodium" or "no salt added."  · Eat fresh foods.  · Eat more vegetables, fruits, and low-fat dairy products.  · Choose whole grains. Look for the word "whole" as the first word in the ingredient list.  · Choose fish and skinless chicken or turkey more often than red meat. Limit fish, poultry, and meat to 6 oz (170 g) each day.  · Limit sweets, desserts, sugars, and sugary drinks.  · Choose heart-healthy fats.  · Limit cheese to 1 oz (28 g) per day.  · Eat more home-cooked food and less restaurant, buffet, and fast food.  · Limit fried foods.  · Cook foods using methods other than frying.  · Limit canned vegetables. If you do use them, rinse them well to decrease the sodium.  · When eating at a restaurant, ask that your food be prepared with less salt, or no salt if possible.  WHAT FOODS CAN I EAT?  Seek help from a dietitian for individual calorie needs.  Grains  Whole grain or whole wheat bread. Brown rice. Whole grain or whole wheat pasta. Quinoa, bulgur, and whole grain cereals. Low-sodium cereals. Corn or whole wheat flour tortillas. Whole grain cornbread. Whole grain crackers. Low-sodium crackers.  Vegetables  Fresh or frozen vegetables  (raw, steamed, roasted, or grilled). Low-sodium or reduced-sodium tomato and vegetable juices. Low-sodium or reduced-sodium tomato sauce and paste. Low-sodium or reduced-sodium canned vegetables.   Fruits  All fresh, canned (in natural juice), or frozen fruits.  Meat and Other Protein Products  Ground beef (85% or leaner), grass-fed beef, or beef trimmed of fat. Skinless chicken or turkey. Ground chicken or turkey. Pork trimmed of fat. All fish and seafood. Eggs. Dried beans, peas, or lentils. Unsalted nuts and seeds. Unsalted canned beans.  Dairy  Low-fat dairy products, such as skim or 1% milk, 2% or reduced-fat cheeses, low-fat ricotta or cottage cheese, or plain low-fat yogurt. Low-sodium or reduced-sodium cheeses.  Fats and Oils  Tub margarines without trans fats. Light or reduced-fat mayonnaise and salad dressings (reduced sodium). Avocado. Safflower, olive, or canola oils. Natural peanut or almond butter.  Other  Unsalted popcorn and pretzels.  The items listed above may not be a complete list of recommended foods or beverages. Contact your dietitian for more options.  WHAT FOODS ARE NOT RECOMMENDED?  Grains  White bread. White pasta. White rice. Refined cornbread. Bagels and croissants. Crackers that contain trans fat.  Vegetables  Creamed or fried vegetables. Vegetables in a cheese sauce. Regular canned vegetables. Regular canned tomato sauce and paste. Regular tomato and vegetable juices.  Fruits  Dried fruits. Canned fruit in light or heavy syrup. Fruit juice.  Meat and Other Protein   Products  Fatty cuts of meat. Ribs, chicken wings, bacon, sausage, bologna, salami, chitterlings, fatback, hot dogs, bratwurst, and packaged luncheon meats. Salted nuts and seeds. Canned beans with salt.  Dairy  Whole or 2% milk, cream, half-and-half, and cream cheese. Whole-fat or sweetened yogurt. Full-fat cheeses or blue cheese. Nondairy creamers and whipped toppings. Processed cheese, cheese spreads, or cheese  curds.  Condiments  Onion and garlic salt, seasoned salt, table salt, and sea salt. Canned and packaged gravies. Worcestershire sauce. Tartar sauce. Barbecue sauce. Teriyaki sauce. Soy sauce, including reduced sodium. Steak sauce. Fish sauce. Oyster sauce. Cocktail sauce. Horseradish. Ketchup and mustard. Meat flavorings and tenderizers. Bouillon cubes. Hot sauce. Tabasco sauce. Marinades. Taco seasonings. Relishes.  Fats and Oils  Butter, stick margarine, lard, shortening, ghee, and bacon fat. Coconut, palm kernel, or palm oils. Regular salad dressings.  Other  Pickles and olives. Salted popcorn and pretzels.  The items listed above may not be a complete list of foods and beverages to avoid. Contact your dietitian for more information.  WHERE CAN I FIND MORE INFORMATION?  National Heart, Lung, and Blood Institute: www.nhlbi.nih.gov/health/health-topics/topics/dash/     This information is not intended to replace advice given to you by your health care provider. Make sure you discuss any questions you have with your health care provider.     Document Released: 04/21/2011 Document Revised: 05/23/2014 Document Reviewed: 03/06/2013  Elsevier Interactive Patient Education ©2016 Elsevier Inc.

## 2015-06-19 ENCOUNTER — Ambulatory Visit (HOSPITAL_COMMUNITY)
Admission: RE | Admit: 2015-06-19 | Discharge: 2015-06-19 | Disposition: A | Discharge status: Home / Self Care | Payer: Self-pay | Source: Ambulatory Visit | Attending: Cardiology | Admitting: Cardiology

## 2015-06-19 VITALS — BP 133/82 | HR 90 | Ht 72.0 in | Wt 196.8 lb

## 2015-06-19 DIAGNOSIS — Z79899 Other long term (current) drug therapy: Secondary | ICD-10-CM | POA: Insufficient documentation

## 2015-06-19 DIAGNOSIS — R601 Generalized edema: Secondary | ICD-10-CM | POA: Insufficient documentation

## 2015-06-19 DIAGNOSIS — D649 Anemia, unspecified: Secondary | ICD-10-CM | POA: Insufficient documentation

## 2015-06-19 DIAGNOSIS — K269 Duodenal ulcer, unspecified as acute or chronic, without hemorrhage or perforation: Secondary | ICD-10-CM | POA: Insufficient documentation

## 2015-06-19 DIAGNOSIS — I5022 Chronic systolic (congestive) heart failure: Secondary | ICD-10-CM | POA: Insufficient documentation

## 2015-06-19 DIAGNOSIS — I429 Cardiomyopathy, unspecified: Secondary | ICD-10-CM | POA: Insufficient documentation

## 2015-06-19 LAB — BASIC METABOLIC PANEL
Anion gap: 7 (ref 5–15)
BUN: 13 mg/dL (ref 6–20)
CALCIUM: 9.5 mg/dL (ref 8.9–10.3)
CHLORIDE: 106 mmol/L (ref 101–111)
CO2: 27 mmol/L (ref 22–32)
CREATININE: 1 mg/dL (ref 0.61–1.24)
GFR calc non Af Amer: >60 mL/min (ref >60)
GLUCOSE: 145 mg/dL — AB (ref 65–99)
Potassium: 4.1 mmol/L (ref 3.5–5.1)
Sodium: 140 mmol/L (ref 135–145)

## 2015-06-19 LAB — BRAIN NATRIURETIC PEPTIDE: B Natriuretic Peptide: 222.4 pg/mL — ABNORMAL HIGH (ref 0.0–100.0)

## 2015-06-19 LAB — DIGOXIN LEVEL

## 2015-06-19 MED ORDER — CARVEDILOL 6.25 MG PO TABS: 6.2500 mg | ORAL_TABLET | Freq: Two times a day (BID) | ORAL | Status: DC

## 2015-06-19 MED ORDER — HYDRALAZINE HCL 50 MG PO TABS: 50.0000 mg | ORAL_TABLET | Freq: Three times a day (TID) | ORAL | Status: DC

## 2015-06-19 MED FILL — CARVEDILOL 6.25 MG TABLET: 6.25 | 30 days supply | Qty: 60 | Fill #0

## 2015-06-19 MED FILL — ?DIGITEK 125 MCG TABLET: 125 | 30 days supply | Qty: 30 | Fill #1

## 2015-06-19 MED FILL — hydrALAZINE HCL 50 MG TABS: 50 | 30 days supply | Qty: 90 | Fill #0

## 2015-06-19 NOTE — Patient Instructions (Addendum)
Increase Carvedilol to 6.25 mg Twice daily   Increase Hydralazine to 50 mg Three times a day   Labs today  Your physician has requested that you have a cardiac MRI. Cardiac MRI uses a computer to create images of your heart as its beating, producing both still and moving pictures of your heart and major blood vessels. For further information please visit [email or web address] Please follow the instruction sheet given to you today for more information.  Your physician recommends that you schedule a follow-up appointment in: 1 month

## 2015-06-20 NOTE — Progress Notes (Signed)
Patient ID: Kyle Conrad, male   DOB: 1965-11-23, 50 y.o.   MRN: [id number] PCP: [facility] Cardiology: Dr. Aundra Dubin  50 yo with history of nonischemic cardiomyopathy presents for cardiology followup. He was initially diagnosed with CHF in 2010.  He was admitted then and treated for CHF exacerbation, EF was low.  However, on repeat echo in 2010, EF was back to normal and he was lost to followup.  He was admitted in 12/16 with acute systolic CHF.  Dyspnea had gradually built up and he had anasarca.  Echo showed EF 10-15% with diffuse hypokinesis, moderate RV dysfunction, and moderate to severe MR.  Cardiac output was low and he was started on milrinone gtt and IV Lasix.  He was diuresed extensively and had coronary angiography showing no CAD.  He was weaned off milrinone and sent home.  During this stay, he was also noted to have iron deficiency anemia and FOBT+.  Colonscopy showed diverticulosis and EGD showed a nonbleeding duodenal ulcer.  He had hypoalbuminemia, elevated INR, and ascites but imaging of the liver was not suggestive of cirrhosis.   He has been doing well.  He is taking all his meds as ordered except Coreg. He is still taking 3.125 mg bid due to confusion with instructions after last appointment.  He goes to the gym and walks for exercise.  No chest pain, no palpitations, no lightheadedness.  No orthopnea/PND.  Labs (12/16): K 4.2, creatinine 0.93, HCT 28.6, albumin 2, LFTs normal, SPEP negative, UPEP negative.  Labs (1/17): K 4.1, creatinine 0.78, HCT 30.1, BNP 1354, UA negative for protein.   ECG: NSR, low voltage (12/16)  PMH: 1. H/o angioedema with ACEI 2. Hypoalbuminemia 3. CHF: Nonischemic cardiomyopathy.  1st found in 2010.  Treated for cardiomyopathy and followup echo in 2010 showed recovery in EF to normal.  Admitted with acute systolic CHF in [id number].  Echo (12/16) with EF 10-15%, severe LV dilation, moderate-severe MR, RV moderately dilated with moderately decreased systolic  function.  LHC/RHC (12/16) with mean RA 6, PA 42/21 mean 33, mean PCWP 14, CI 5.1 Fick/3.6 thermo, PVR 2.84.  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  SPEP/UPEP negative.  4. Anemia: Fe-deficiency.  FOBT+.  Colonoscopy 12/16 with diverticulosis.  EGD with small, nonbleeding duodenal ulcer.   5. HTN 6. ?Cirrhosis: Elevated INR with ascites and hypoalbuminemia at 12/16 admission.  CT abdomen showed normal liver contour.   7. R>L arm edema with normal upper extremity venous dopplers in 12/16.   SH: Lives alone in [place].  No ETOH or smoking.    FH: No premature CAD, no CHF that he knows of.   ROS: All systems reviewed and negative except as per HPI.    Current Outpatient Prescriptions  Medication Sig Dispense Refill  . carvedilol (COREG) 6.25 MG tablet Take 1 tablet (6.25 mg total) by mouth 2 (two) times daily with a meal. 60 tablet 3  . digoxin (LANOXIN) 0.125 MG tablet Take 1 tablet (0.125 mg total) by mouth daily. 30 tablet 1  . furosemide (LASIX) 40 MG tablet Take 1 tablet (40 mg total) by mouth daily. 30 tablet 1  . hydrALAZINE (APRESOLINE) 50 MG tablet Take 1 tablet (50 mg total) by mouth 3 (three) times daily. 90 tablet 6  . isosorbide mononitrate (IMDUR) 60 MG 24 hr tablet Take 1 tablet (60 mg total) by mouth daily. 30 tablet 2  . magnesium oxide (MAG-OX) 400 (241.3 MG) MG tablet Take 1 tablet (400 mg total) by mouth  2 (two) times daily. 60 tablet 1  . Omeprazole 20 MG TBEC Take 1 tablet (20 mg total) by mouth daily. 30 each 3  . spironolactone (ALDACTONE) 25 MG tablet Take 1 tablet (25 mg total) by mouth daily. 30 tablet 1   No current facility-administered medications for this encounter.   BP 133/82 mmHg  Pulse 90  Ht 6' (1.829 m)  Wt 196 lb 12.8 oz (89.268 kg)  BMI 26.69 kg/m2  SpO2 100% General: NAD Neck: JVP 7 cm, no thyromegaly or thyroid nodule.  Lungs: Clear to auscultation bilaterally with normal respiratory effort. CV: Nondisplaced PMI.   Heart regular S1/S2, no S3/S4, no murmur.  No peripheral edema.  No carotid bruit.  Normal pedal pulses.  Abdomen: Soft, nontender, no hepatosplenomegaly, no distention.  Skin: Intact without lesions or rashes.  Neurologic: Alert and oriented x 3.  Psych: Normal affect. Extremities: No clubbing or cyanosis.  HEENT: Normal.   Assessment/Plan: 1. Chronic systolic CHF: Nonischemic cardiomyopathy.  Echo with EF 10-15% and moderate RV dysfunction.  Coronary angiography 12/16 without significant CAD.  Cause uncertain.  He carries history of HTN but BP has not been high.  No family history of CHF.  SPEP/UPEP negative.  No evidence for pulmonary sarcoidosis on chest CT.  Possible viral myocarditis, also have not ruled out transthyretin amyloidosis.  On exam today, he is not significantly volume overloaded.   - I will plan a cardiac MRI to assess for evidence of myocarditis or amyloidosis.  - He will need repeat echo in 6/16 to decide on need for ICD.  - Continue digoxin, check level today.  - Increase Coreg to 6.25 mg bid.  - Increase hydralazine to 50 mg tid.  Continue current Imdur. - Continue current spironolactone and Lasix. BMET today.  - Holding off on ACEI/ARB for now given angioedema with ACEI, may consider ARB in future.  - Continue low sodium diet. 2. Duodenal ulcer/anemia/FOBT+: Continue omeprazole.  3. Hypoalbuminemia: ?Etiology.  May be related to liver disease (decreased synthesis) but liver did not appear cirrhotic on CT.  He had heavy proteinuria in the hospital, but most recent UA was negative for protein surprisingly. 4. ?Cirrhosis: Some concern for cirrhosis based on low albumin, high INR, and ascites, but liver did not appear cirrhotic on CT.   Followup in 1 month  Loralie Champagne 06/20/2015

## 2015-06-23 MED FILL — ?OMEPRAZOLE DR 20 MG CAPSUL: 20 | 30 days supply | Qty: 30 | Fill #1 | Status: TO

## 2015-07-16 ENCOUNTER — Telehealth (HOSPITAL_COMMUNITY): Payer: Self-pay | Admitting: Vascular Surgery

## 2015-07-16 NOTE — Telephone Encounter (Signed)
Left second message to change pt appt

## 2015-07-16 NOTE — Telephone Encounter (Signed)
Left pt message to change appt 

## 2015-07-17 ENCOUNTER — Encounter (HOSPITAL_COMMUNITY): Payer: Self-pay

## 2015-07-23 ENCOUNTER — Ambulatory Visit (HOSPITAL_COMMUNITY)
Admission: RE | Admit: 2015-07-23 | Discharge: 2015-07-23 | Disposition: A | Discharge status: Home / Self Care | Payer: BLUE CROSS/BLUE SHIELD | Source: Ambulatory Visit | Attending: Internal Medicine | Admitting: Internal Medicine

## 2015-07-23 VITALS — BP 168/104 | HR 68 | Wt 213.6 lb

## 2015-07-23 DIAGNOSIS — I5022 Chronic systolic (congestive) heart failure: Secondary | ICD-10-CM | POA: Insufficient documentation

## 2015-07-23 DIAGNOSIS — I10 Essential (primary) hypertension: Secondary | ICD-10-CM

## 2015-07-23 DIAGNOSIS — I428 Other cardiomyopathies: Secondary | ICD-10-CM | POA: Insufficient documentation

## 2015-07-23 DIAGNOSIS — K269 Duodenal ulcer, unspecified as acute or chronic, without hemorrhage or perforation: Secondary | ICD-10-CM | POA: Diagnosis not present

## 2015-07-23 DIAGNOSIS — E8809 Other disorders of plasma-protein metabolism, not elsewhere classified: Secondary | ICD-10-CM

## 2015-07-23 DIAGNOSIS — I110 Hypertensive heart disease with heart failure: Secondary | ICD-10-CM | POA: Diagnosis not present

## 2015-07-23 DIAGNOSIS — D509 Iron deficiency anemia, unspecified: Secondary | ICD-10-CM | POA: Insufficient documentation

## 2015-07-23 DIAGNOSIS — E663 Overweight: Secondary | ICD-10-CM | POA: Diagnosis not present

## 2015-07-23 DIAGNOSIS — Z79899 Other long term (current) drug therapy: Secondary | ICD-10-CM | POA: Diagnosis not present

## 2015-07-23 DIAGNOSIS — I5023 Acute on chronic systolic (congestive) heart failure: Secondary | ICD-10-CM | POA: Diagnosis present

## 2015-07-23 DIAGNOSIS — R188 Other ascites: Secondary | ICD-10-CM | POA: Diagnosis not present

## 2015-07-23 DIAGNOSIS — D500 Iron deficiency anemia secondary to blood loss (chronic): Secondary | ICD-10-CM

## 2015-07-23 LAB — BASIC METABOLIC PANEL
ANION GAP: 9 (ref 5–15)
BUN: 11 mg/dL (ref 6–20)
CALCIUM: 9.2 mg/dL (ref 8.9–10.3)
CO2: 23 mmol/L (ref 22–32)
Chloride: 108 mmol/L (ref 101–111)
Creatinine, Ser: 0.96 mg/dL (ref 0.61–1.24)
GLUCOSE: 104 mg/dL — AB (ref 65–99)
POTASSIUM: 4 mmol/L (ref 3.5–5.1)
Sodium: 140 mmol/L (ref 135–145)

## 2015-07-23 LAB — BRAIN NATRIURETIC PEPTIDE: B Natriuretic Peptide: 72.5 pg/mL (ref 0.0–100.0)

## 2015-07-23 MED ORDER — ISOSORBIDE MONONITRATE ER 60 MG PO TB24: 60.0000 mg | ORAL_TABLET | Freq: Every day | ORAL | Status: DC

## 2015-07-23 MED ORDER — DIGOXIN 125 MCG PO TABS: 0.1250 mg | ORAL_TABLET | Freq: Every day | ORAL | Status: DC

## 2015-07-23 MED ORDER — SPIRONOLACTONE 25 MG PO TABS: 25.0000 mg | ORAL_TABLET | Freq: Every day | ORAL | Status: DC

## 2015-07-23 MED ORDER — FUROSEMIDE 40 MG PO TABS: 40.0000 mg | ORAL_TABLET | Freq: Every day | ORAL | Status: DC

## 2015-07-23 MED ORDER — CARVEDILOL 6.25 MG PO TABS: 6.2500 mg | ORAL_TABLET | Freq: Two times a day (BID) | ORAL | Status: DC

## 2015-07-23 MED ORDER — HYDRALAZINE HCL 50 MG PO TABS: 50.0000 mg | ORAL_TABLET | Freq: Three times a day (TID) | ORAL | Status: DC

## 2015-07-23 MED FILL — DIGOXIN 125 MCG TABLET: 125 | 30 days supply | Qty: 30 | Fill #0

## 2015-07-23 MED FILL — ?SPIRONOLACTONE 25 MG TABLE: 25 | 30 days supply | Qty: 30 | Fill #0

## 2015-07-23 MED FILL — FUROSEMIDE 40 MG TABLET: 40 | 30 days supply | Qty: 30 | Fill #0

## 2015-07-23 MED FILL — hydrALAZINE HCL 50 MG TABS: 50 | 30 days supply | Qty: 90 | Fill #0

## 2015-07-23 MED FILL — ISOSORBIDE MN ER 60 MG TAB: 60 | 30 days supply | Qty: 30 | Fill #0

## 2015-07-23 MED FILL — CARVEDILOL 6.25 MG TABLET: 6.25 | 30 days supply | Qty: 60 | Fill #0

## 2015-07-23 NOTE — Patient Instructions (Signed)
Labs today  All medication refills have been sent into your pharmacy.   Your physician has requested that you have a cardiac MRI. Cardiac MRI uses a computer to create images of your heart as its beating, producing both still and moving pictures of your heart and major blood vessels. For further information please visit [email or web address] Please follow the instruction sheet given to you today for more information. - We will call you one this has been scheduled  Your physician recommends that you schedule a follow-up appointment in: 2 weeks with the CHF pharmacist and in 2 months with Dr. Aundra Dubin  Do the following things EVERYDAY: 1) Weigh yourself in the morning before breakfast. Write it down and keep it in a log. 2) Take your medicines as prescribed 3) Eat low salt foods-Limit salt (sodium) to 2000 mg per day.  4) Stay as active as you can everyday 5) Limit all fluids for the day to less than 2 liters 6)

## 2015-07-23 NOTE — Progress Notes (Signed)
Advanced Heart Failure Medication Review by a Pharmacist  Does the patient  feel that his/her medications are working for him/her?  yes  Has the patient been experiencing any side effects to the medications prescribed?  Yes.  Some stomach upset if he doesn't take with food.   Does the patient measure his/her own blood pressure or blood glucose at home?  no   Does the patient have any problems obtaining medications due to transportation or finances?   yes  Understanding of regimen: good Understanding of indications: good Potential of compliance: fair Patient understands to avoid NSAIDs. Patient understands to avoid decongestants.  Issues to address at subsequent visits: Compliance   Pharmacist comments: 50 YO pleasant male presenting to clinic. Pt states he took hydralazine, mag ox, digoxin, and  prilosec this AM but is out of his other meds.  Pt states he needs refills on Imdur, Carvedilol, Spironolactone and Lasix. Pt states he ran out in the last day or 2 but this is questionable as patient has questionable adherence  Time with patient: 10 min  Preparation and documentation time: 5 min  Total time: 15 min

## 2015-07-23 NOTE — Progress Notes (Signed)
Patient ID: Kyle Conrad, male   DOB: October 31, 1965, 50 y.o.   MRN: [id number]   Leitha Schuller   PCP: [facility] Cardiology: Dr. Aundra Dubin  50 yo with history of nonischemic cardiomyopathy presents for cardiology followup. He was initially diagnosed with CHF in 2010.  He was admitted then and treated for CHF exacerbation, EF was low.  However, on repeat echo in 2010, EF was back to normal and he was lost to followup.  He was admitted in 12/16 with acute systolic CHF.  Dyspnea had gradually built up and he had anasarca.  Echo showed EF 10-15% with diffuse hypokinesis, moderate RV dysfunction, and moderate to severe MR.  Cardiac output was low and he was started on milrinone gtt and IV Lasix.  He was diuresed extensively and had coronary angiography showing no CAD.  He was weaned off milrinone and sent home.  During this stay, he was also noted to have iron deficiency anemia and FOBT+.  Colonscopy showed diverticulosis and EGD showed a nonbleeding duodenal ulcer.  He had hypoalbuminemia, elevated INR, and ascites but imaging of the liver was not suggestive of cirrhosis.   He presents today for regular follow up. Overall feels good. Has been off coreg, lasix, and imdur x 2 days. Does not take BP at home.  His weight shows he is up 17 lbs since last visit. Has been working out and eating a lot more. Going to the gym, mostly lifts weight.  Only cardio really is walking.  Can get around a grocery store without SOB. Sleeps on 2-3 pillows chronically. Denies bendopnea.  No CP or palpitations.  Occasionally lightheaded with rapid standing, not marked or limiting.    Labs (12/16): K 4.2, creatinine 0.93, HCT 28.6, albumin 2, LFTs normal, SPEP negative, UPEP negative.  Labs (1/17): K 4.1, creatinine 0.78, HCT 30.1, BNP 1354, UA negative for protein.   ECG: NSR, low voltage (12/16)  PMH: 1. H/o angioedema with ACEI 2. Hypoalbuminemia 3. CHF: Nonischemic cardiomyopathy.  1st found in 2010.  Treated for  cardiomyopathy and followup echo in 2010 showed recovery in EF to normal.  Admitted with acute systolic CHF in Q000111Q.  Echo (12/16) with EF 10-15%, severe LV dilation, moderate-severe MR, RV moderately dilated with moderately decreased systolic function.  LHC/RHC (12/16) with mean RA 6, PA 42/21 mean 33, mean PCWP 14, CI 5.1 Fick/3.6 thermo, PVR 2.84.  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  SPEP/UPEP negative.  4. Anemia: Fe-deficiency.  FOBT+.  Colonoscopy 12/16 with diverticulosis.  EGD with small, nonbleeding duodenal ulcer.   5. HTN 6. ?Cirrhosis: Elevated INR with ascites and hypoalbuminemia at 12/16 admission.  CT abdomen showed normal liver contour.   7. R>L arm edema with normal upper extremity venous dopplers in 12/16.   SH: Lives alone in [place].  No ETOH or smoking.    FH: No premature CAD, no CHF that he knows of.   ROS: All systems reviewed and negative except as per HPI.    Current Outpatient Prescriptions  Medication Sig Dispense Refill  . carvedilol (COREG) 6.25 MG tablet Take 1 tablet (6.25 mg total) by mouth 2 (two) times daily with a meal. 60 tablet 3  . digoxin (LANOXIN) 0.125 MG tablet Take 1 tablet (0.125 mg total) by mouth daily. 30 tablet 1  . furosemide (LASIX) 40 MG tablet Take 1 tablet (40 mg total) by mouth daily. 30 tablet 1  . hydrALAZINE (APRESOLINE) 50 MG tablet Take 1 tablet (50 mg total)  by mouth 3 (three) times daily. 90 tablet 6  . isosorbide mononitrate (IMDUR) 60 MG 24 hr tablet Take 1 tablet (60 mg total) by mouth daily. 30 tablet 2  . magnesium oxide (MAG-OX) 400 (241.3 MG) MG tablet Take 1 tablet (400 mg total) by mouth 2 (two) times daily. 60 tablet 1  . Omeprazole 20 MG TBEC Take 1 tablet (20 mg total) by mouth daily. 30 each 3  . spironolactone (ALDACTONE) 25 MG tablet Take 1 tablet (25 mg total) by mouth daily. 30 tablet 1   No current facility-administered medications for this encounter.   BP 168/104 mmHg  Pulse 68   Wt 213 lb 9.6 oz (96.888 kg)  SpO2 98%   Wt Readings from Last 3 Encounters:  07/23/15 213 lb 9.6 oz (96.888 kg)  06/19/15 196 lb 12.8 oz (89.268 kg)  05/28/15 189 lb (85.73 kg)    General: NAD Neck: JVP 7-8 cm, no thyromegaly or thyroid nodule.  Lungs: CTAB, normal effort CV: Nondisplaced PMI.  Heart regular S1/S2, no S3/S4, no murmur.  Trace ankle edema.  No carotid bruit.  Normal pedal pulses.  Abdomen: Soft, NT, ND, no HSM. No bruits or masses. +BS Skin: Intact without lesions or rashes.  Neurologic: Alert and oriented x 3.  Psych: Normal affect. Extremities: No clubbing or cyanosis.  HEENT: Normal.   Assessment/Plan: 1. Chronic systolic CHF: Nonischemic cardiomyopathy.  Echo with EF 10-15% and moderate RV dysfunction.  Coronary angiography 12/16 without significant CAD.  Cause uncertain.  He carries history of HTN but BP has not been high.  No family history of CHF.  SPEP/UPEP negative.  No evidence for pulmonary sarcoidosis on chest CT.  Possible viral myocarditis, also have not ruled out transthyretin amyloidosis.   - Despite weight gain, he does not look overloaded on exam.  - Resume lasix 40 mg daily. Can take extra lasix prn for weight gains of 3 lbs overnight or 5 lbs in one week.  - Has cMRI ordered but needs to be scheduled.  - Continue digoxin, check level today.  - Resume Coreg 6.25 mg bid.  - Continue hydralazine 50 mg tid.  Resume Imdur 60 mg daily - Resume spironolactone 25 mg daily. BMET BNP today.   - Holding off on ACEI/ARB for now given angioedema with ACEI, may consider ARB in future.  - Continue low sodium diet. - Encouraged him to weight daily and to get a BP cuff to check his pressures.   2. Duodenal ulcer/anemia/FOBT+: Continue omeprazole.  3. Hypoalbuminemia: ?Etiology.  May be related to liver disease (decreased synthesis) but liver did not appear cirrhotic on CT.   4. ?Cirrhosis: Some concern for cirrhosis based on low albumin, high INR, and ascites,  but liver did not appear cirrhotic on CT.  5. HTN - Has been off of meds for several days.  Will not make any changes now.  To get BP cuff at [facility] and call if remains elevated.  Will recheck at visit x 2 weeks with Pharm-D.   Followup up 2 weeks with Pharmacist. 2 months with MD. Schedule cMRI. BMET/BNP today.   Satira Mccallum Tillery PA-C 07/23/2015

## 2015-07-30 ENCOUNTER — Encounter: Payer: Self-pay | Admitting: Cardiology

## 2015-08-10 ENCOUNTER — Encounter (HOSPITAL_COMMUNITY): Payer: Self-pay

## 2015-08-10 ENCOUNTER — Ambulatory Visit (HOSPITAL_COMMUNITY)
Admission: RE | Admit: 2015-08-10 | Discharge: 2015-08-10 | Disposition: A | Discharge status: Home / Self Care | Payer: BLUE CROSS/BLUE SHIELD | Source: Ambulatory Visit | Attending: Internal Medicine | Admitting: Internal Medicine

## 2015-08-10 VITALS — BP 162/80 | HR 93 | Wt 221.0 lb

## 2015-08-10 DIAGNOSIS — K269 Duodenal ulcer, unspecified as acute or chronic, without hemorrhage or perforation: Secondary | ICD-10-CM | POA: Diagnosis not present

## 2015-08-10 DIAGNOSIS — E8809 Other disorders of plasma-protein metabolism, not elsewhere classified: Secondary | ICD-10-CM | POA: Insufficient documentation

## 2015-08-10 DIAGNOSIS — R188 Other ascites: Secondary | ICD-10-CM | POA: Insufficient documentation

## 2015-08-10 DIAGNOSIS — I10 Essential (primary) hypertension: Secondary | ICD-10-CM

## 2015-08-10 DIAGNOSIS — Z79899 Other long term (current) drug therapy: Secondary | ICD-10-CM | POA: Insufficient documentation

## 2015-08-10 DIAGNOSIS — I428 Other cardiomyopathies: Secondary | ICD-10-CM | POA: Diagnosis not present

## 2015-08-10 DIAGNOSIS — I110 Hypertensive heart disease with heart failure: Secondary | ICD-10-CM | POA: Diagnosis not present

## 2015-08-10 DIAGNOSIS — D649 Anemia, unspecified: Secondary | ICD-10-CM | POA: Insufficient documentation

## 2015-08-10 DIAGNOSIS — E663 Overweight: Secondary | ICD-10-CM

## 2015-08-10 DIAGNOSIS — I5023 Acute on chronic systolic (congestive) heart failure: Secondary | ICD-10-CM

## 2015-08-10 DIAGNOSIS — I5022 Chronic systolic (congestive) heart failure: Secondary | ICD-10-CM

## 2015-08-10 LAB — BASIC METABOLIC PANEL
ANION GAP: 8 (ref 5–15)
BUN: 9 mg/dL (ref 6–20)
CHLORIDE: 106 mmol/L (ref 101–111)
CO2: 26 mmol/L (ref 22–32)
CREATININE: 1.03 mg/dL (ref 0.61–1.24)
Calcium: 9.1 mg/dL (ref 8.9–10.3)
GFR calc non Af Amer: >60 mL/min (ref >60)
GLUCOSE: 111 mg/dL — AB (ref 65–99)
Potassium: 4.2 mmol/L (ref 3.5–5.1)
Sodium: 140 mmol/L (ref 135–145)

## 2015-08-10 MED ORDER — CARVEDILOL 12.5 MG PO TABS: 12.5000 mg | ORAL_TABLET | Freq: Two times a day (BID) | ORAL | Status: DC

## 2015-08-10 MED FILL — CARVEDILOL 12.5 MG TABLET: 12.5 | 30 days supply | Qty: 60 | Fill #0

## 2015-08-10 NOTE — Patient Instructions (Addendum)
Thank you for coming in today.  Please remember to pick up your Blood Pressure cuff from [facility] this week.  Continue Lasix (furosemide) 40 mg daily.  Call the Heart failure clinic if your weight changes by 3 lbs in one day or by 5 lbs in one week.  Increase Carvedilol to 12.5 mg by mouth twice daily (you can take two of the 6.25 mg tabs twice daily until you run out of that prescription then take one 12.5 mg tablet twice daily with the new prescription) Please keep your appointment with Dr Loralie Champagne on 09/22/15

## 2015-08-10 NOTE — Progress Notes (Signed)
HPI:  Kyle Conrad is a 50 yo AA male with history of nonischemic cardiomyopathy. He was initially diagnosed with HFrEF in 2010. However, on repeat echo in 2010, EF was back to normal and he was lost to followup. He was admitted in 12/16 with acute systolic CHF.Echo showed EF 10-15% with diffuse hypokinesis, moderate RV dysfunction, and moderate to severe MR. Cardiac output was low and he was started on milrinone gtt and IV Lasix. He was diuresed extensively and had coronary angiography showing no CAD. He was weaned off milrinone and sent home. During this stay, he was also noted to have iron deficiency anemia and FOBT+. Colonscopy showed diverticulosis and EGD showed a nonbleeding duodenal ulcer. He had hypoalbuminemia, elevated INR, and ascites but imaging of the liver was not suggestive of cirrhosis.   He presents today for pharmacist-led medication titration visit. Overall feels good. Does not take BP at home. His weight shows he is up 8 lbs since last visit. Has been working out and eating a lot more. Going to the gym, mostly lifts weight. Only cardio really is walking about 2 miles daily without SOB. Sleeps on 2-3 pillows chronically. Occasionally lightheaded with rapid standing, not marked or limiting.   . Shortness of breath/dyspnea on exertion? No  . Orthopnea/PND? Yes, 2 pillows.  Denies waking up from SOB . Edema? No LEE, abdomen soft non distended  . Lightheadedness/dizziness? Occasionally after he takes his meds then it goes away after he sits down . Daily weights at home? Yes - ~210 lb stable  . Blood pressure/heart rate monitoring at home? No . Following low-sodium/fluid-restricted diet? No.  Reports drinking multiple pitchers of brita water per day but reports trying to limit his salt intake. He reports reading the label for most foods before he eats them.  . Reports exercise 4-5 days/week with moderate intensity. Denies shortness of breath with exercise.  Mostly does weight  lifting, bike riding and walking.   HF Medications: Carvedilol 6.25 mg PO BID Digoxin 0.125 mg PO daily Furosemide 40 mg PO daily Hydralazine 50 mg PO TID Isosorbide mononitrate 60 mg PO daily Spironolactone 25 mg PO daily  Precautions/Contraindications: Lisinopril - lip swelling  Has the patient been experiencing any side effects to the medications prescribed?  Yes reports some dizziness and tiredness after he takes his meds in the AM so he sits down for a while and is able to tolerate them.   Pt reports missing 1-2 doses of lasix since last visit.   Does the patient have any problems obtaining medications due to transportation or finances?   No - [employer]  Understanding of regimen: good Understanding of indications: good Potential of compliance: good Patient understands to avoid NSAIDs. Patient understands to avoid decongestants.    Pertinent Lab Values : . 08/10/15 - Serum creatinine 1.03, CO2 26, Potassium 4.2, Sodium 140 . 07/23/15 - Serum creatinine 0.96, CO2 23, Potassium 4.0, Sodium 140, BNP 72.5, Digoxin <0.2 (06/19/15)   Vital Signs: Filed Vitals:   08/10/15 1411 08/10/15 1447  BP: 158/82 162/80  Pulse: 88 93  Weight: 221 lb (100.245 kg)   SpO2:  98%  *Weight at last HFc visit: 213 lb   Heart Failure Assessment & Plan: . Ejection fraction: 20-25% . NYHA symptom class: II . JVP 7-8 cm . Based on clinical presentation, vital signs, and recent labs, will recommend the following.   REDS VEST READING= 38 CHEST RULER=3  VEST FITTING TASKS: POSTURE= Standing HEIGHT MARKER= Tall CENTER STRIP= Shifted  COMMENTS: Large chest    Summary: 1) Medication changes: Increase Carvedilol to 12.5 mg BID  2) Labs: BMET today.  3) Follow-up: 09/22/15 with Loralie Champagne, MD  Assessment/Plan: 1. HFrEF  - Despite weight gain, he does not look overloaded on exam. He is able to exercise 4-5x/week without shortness of breath.  - Continue lasix 40 mg daily. Can take extra lasix  prn for weight gains of 3 lbs overnight or 5 lbs in one week.  - Increase Coreg to 12.5 mg bid.  - Continue hydralazine 50 mg tid, Imdur 60 mg daily, spironolactone 25 mg daily.   - Holding off on ACEI/ARB for now given angioedema (mild lip swelling only) with ACEI, may consider ARB in future. Discussed ARNI and ARB with patient and he was willing to try at next visit.  - Continue low sodium diet. - Reviewed indications for each of his HF medications and the importance of compliance.  - Encouraged to continue to weigh daily and to get a BP cuff to check his pressures.  2. Duodenal ulcer/anemia/FOBT+: Continue omeprazole.  3. Hypoalbuminemia: ?Etiology. May be related to liver disease (decreased synthesis) but liver did not appear cirrhotic on CT. 4. ?Cirrhosis: Some concern for cirrhosis based on low albumin, high INR, and ascites, but liver did not appear cirrhotic on CT.  5. HTN - More adherent to meds but BP still elevated. Recommended to get BP cuff at [facility] ([employer] $26.99) and call if remains elevated by the end of the week.   Patient seen with Bennye Alm, PharmD Pharmacy Resident and Kristeen Mans, PharmD Pharmacy Resident   Ruta Hinds. Velva Harman, PharmD, BCPS, CPP Clinical Pharmacist Pager: [phone] Phone: [phone] 08/10/2015 3:48 PM

## 2015-08-14 ENCOUNTER — Ambulatory Visit (HOSPITAL_COMMUNITY)
Admission: RE | Admit: 2015-08-14 | Discharge: 2015-08-14 | Disposition: A | Discharge status: Home / Self Care | Payer: BLUE CROSS/BLUE SHIELD | Source: Ambulatory Visit | Attending: Cardiology | Admitting: Cardiology

## 2015-08-14 DIAGNOSIS — R601 Generalized edema: Secondary | ICD-10-CM

## 2015-08-14 DIAGNOSIS — I517 Cardiomegaly: Secondary | ICD-10-CM | POA: Diagnosis not present

## 2015-08-14 DIAGNOSIS — I5022 Chronic systolic (congestive) heart failure: Secondary | ICD-10-CM

## 2015-08-14 DIAGNOSIS — I429 Cardiomyopathy, unspecified: Secondary | ICD-10-CM | POA: Diagnosis not present

## 2015-08-14 MED ORDER — GADOBENATE DIMEGLUMINE 529 MG/ML IV SOLN
34.0000 mL | Freq: Once | INTRAVENOUS | Status: AC | PRN
  Administered 2015-08-14: 34 mL via INTRAVENOUS

## 2015-08-17 MED ORDER — GADOBENATE DIMEGLUMINE 529 MG/ML IV SOLN
34.0000 mL | Freq: Once | INTRAVENOUS | Status: AC | PRN
  Administered 2015-08-14: 34 mL via INTRAVENOUS

## 2015-08-18 ENCOUNTER — Telehealth (HOSPITAL_COMMUNITY): Payer: Self-pay

## 2015-08-18 NOTE — Telephone Encounter (Signed)
VM left on patient's secure VM with echo results and return call back # for any further questions/concerns.  Renee Pain

## 2015-08-28 ENCOUNTER — Ambulatory Visit: Payer: Self-pay | Admitting: Family Medicine

## 2015-09-10 ENCOUNTER — Ambulatory Visit (INDEPENDENT_AMBULATORY_CARE_PROVIDER_SITE_OTHER): Payer: BLUE CROSS/BLUE SHIELD | Admitting: Family Medicine

## 2015-09-10 ENCOUNTER — Encounter: Payer: Self-pay | Admitting: Family Medicine

## 2015-09-10 VITALS — BP 140/84 | HR 78 | Temp 98.9°F | Resp 16 | Ht 72.0 in | Wt 226.0 lb

## 2015-09-10 DIAGNOSIS — D500 Iron deficiency anemia secondary to blood loss (chronic): Secondary | ICD-10-CM

## 2015-09-10 DIAGNOSIS — I10 Essential (primary) hypertension: Secondary | ICD-10-CM

## 2015-09-10 LAB — POCT URINALYSIS DIP (DEVICE)
BILIRUBIN URINE: NEGATIVE
Glucose, UA: NEGATIVE mg/dL
Hgb urine dipstick: NEGATIVE
Ketones, ur: NEGATIVE mg/dL
Leukocytes, UA: NEGATIVE
NITRITE: NEGATIVE
PH: 5.5 (ref 5.0–8.0)
PROTEIN: NEGATIVE mg/dL
Specific Gravity, Urine: 1.01 (ref 1.005–1.030)
UROBILINOGEN UA: 0.2 mg/dL (ref 0.0–1.0)

## 2015-09-10 LAB — CBC WITH DIFFERENTIAL/PLATELET
BASOS PCT: 1 %
Basophils Absolute: 40 cells/uL (ref 0–200)
EOS ABS: 160 {cells}/uL (ref 15–500)
Eosinophils Relative: 4 %
HCT: 33.9 % — ABNORMAL LOW (ref 38.5–50.0)
Hemoglobin: 11.2 g/dL — ABNORMAL LOW (ref 13.2–17.1)
LYMPHS PCT: 44 %
Lymphs Abs: 1760 cells/uL (ref 850–3900)
MCH: 25.7 pg — ABNORMAL LOW (ref 27.0–33.0)
MCHC: 33 g/dL (ref 32.0–36.0)
MCV: 77.9 fL — AB (ref 80.0–100.0)
MONOS PCT: 13 %
MPV: 8.8 fL (ref 7.5–12.5)
Monocytes Absolute: 520 cells/uL (ref 200–950)
Neutro Abs: 1520 cells/uL (ref 1500–7800)
Neutrophils Relative %: 38 %
PLATELETS: 330 10*3/uL (ref 140–400)
RBC: 4.35 MIL/uL (ref 4.20–5.80)
RDW: 16.5 % — AB (ref 11.0–15.0)
WBC: 4 10*3/uL (ref 3.8–10.8)

## 2015-09-10 NOTE — Progress Notes (Signed)
Subjective:    Patient ID: Jayvion Kinsler, male    DOB: 28-Nov-1965, 50 y.o.   MRN: [id number]  HPI  Mr. Harris Capellan, a 50 year old male with a history of systolic heart failure presents for a 3 month follow-up.   Patient has a history of CHF that was diagnosed in 2010. Current 2 D Echo showed EF 10-15% with diffuse hypokinesis, moderate RV dysfunction, and moderate to severe MR. Patient was evaluated by Dr. Aundra Dubin, cardiologist 1 month ago. Patient had a colonoscopy while hospitalized due to iron deficiency anemia and a positive fecal occult blood. Colonoscopy revealed and non bleeding duodenal ulcer and diverticulosis. He is doing cardiovascular exercise 4-5 days per week and is adherent to a low sodium diet.  He does not check blood pressures at home. Patient denies chest pain, dyspnea, irregular heart beat, lower extremity edema, near-syncope, orthopnea, syncope and tachypnea.   Past Medical History  Diagnosis Date  . CHF (congestive heart failure) ([place])   . Hypertension    Allergies  Allergen Reactions  . Lisinopril Swelling    REACTION: pt had a swelling episode.  His lips swelled  . Penicillins    Immunization History  Administered Date(s) Administered  . Pneumococcal Polysaccharide-23 05/28/2015  . Tdap 05/28/2015   Social History   Social History  . Marital Status: Single    Spouse Name: N/A  . Number of Children: N/A  . Years of Education: N/A   Occupational History  . Not on file.   Social History Main Topics  . Smoking status: Never Smoker   . Smokeless tobacco: Not on file  . Alcohol Use: No  . Drug Use: No  . Sexual Activity: Not on file   Other Topics Concern  . Not on file   Social History Narrative   Past Surgical History  Procedure Laterality Date  . Shoulder surgery    . Cardiac catheterization N/A 04/30/2015    Procedure: Right/Left Heart Cath and Coronary Angiography;  Surgeon: Larey Dresser, MD;  Location: [place] [facility];   Service: Cardiovascular;  Laterality: N/A;  . Esophagogastroduodenoscopy (egd) with propofol N/A 05/01/2015    Procedure: ESOPHAGOGASTRODUODENOSCOPY (EGD) WITH PROPOFOL;  Surgeon: Wilford Corner, MD;  Location: [facility] ENDOSCOPY;  Service: Endoscopy;  Laterality: N/A;  . Colonoscopy with propofol N/A 05/01/2015    Procedure: COLONOSCOPY WITH PROPOFOL;  Surgeon: Wilford Corner, MD;  Location: [facility] ENDOSCOPY;  Service: Endoscopy;  Laterality: N/A;   Review of Systems  Constitutional: Negative for fatigue and unexpected weight change.  HENT: Negative.   Eyes: Negative.  Negative for visual disturbance.  Respiratory: Negative.   Cardiovascular: Negative.  Negative for chest pain, palpitations and leg swelling.  Gastrointestinal: Negative.   Endocrine: Negative.  Negative for polydipsia, polyphagia and polyuria.  Genitourinary: Negative for dysuria.  Musculoskeletal: Negative.   Skin: Negative.   Allergic/Immunologic: Negative.   Neurological: Negative.  Negative for weakness.  Hematological: Negative.   Psychiatric/Behavioral: Negative.        Objective:   Physical Exam  Constitutional: He is oriented to person, place, and time. He appears well-developed and well-nourished.  HENT:  Head: Normocephalic and atraumatic.  Right Ear: External ear normal.  Left Ear: External ear normal.  Nose: Nose normal.  Mouth/Throat: Oropharynx is clear and moist.  Eyes: Conjunctivae and EOM are normal. Pupils are equal, round, and reactive to light.  Neck: Normal range of motion. Neck supple.  Cardiovascular: Normal rate, regular rhythm, normal heart sounds and intact distal pulses.  Pulses:      Carotid pulses are 2+ on the right side, and 2+ on the left side.      Radial pulses are 2+ on the right side, and 2+ on the left side.  Pulmonary/Chest: Effort normal and breath sounds normal.  Abdominal: Soft. Bowel sounds are normal.  Musculoskeletal: Normal range of motion.  Neurological: He is alert  and oriented to person, place, and time. He has normal reflexes.  Skin: Skin is warm and dry.  Psychiatric: He has a normal mood and affect. His behavior is normal. Judgment and thought content normal.      BP 140/84 mmHg  Pulse 78  Temp(Src) 98.9 F (37.2 C) (Oral)  Resp 16  Ht 6' (1.829 m)  Wt 226 lb (102.513 kg)  BMI 30.64 kg/m2  SpO2 99% Assessment & Plan:   1. Essential hypertension Blood pressure is at goal on current medication regimen. The patient is asked to make an attempt to improve diet and exercise patterns to aid in medical management of this problem. Patient has not had a routine eye exam, recommend due to long history of cardiovascular disease. Also, follow up with cardiology as scheduled. Continue doing cardiovascular exercising, strength training and medication. Will continue all medications as prescribed.  - POCT urinalysis dipstick - Ambulatory referral to Ophthalmology  2. Iron deficiency anemia due to chronic blood loss Previous hemoglobin was 9.1, will check hemoglobin. Recommend a diet rich in foods containing protein and iron. Also, recommend an adequate intake of iron in diet.  - CBC with Differential   Routine Health Maintenance:  Recommend annual eye examination Recommend digital rectal examination and PSA, he refuses a digital rectal exam on today.   Patient refuse  RTC: 6 months for DRE and PSA Dorena Dew, FNP

## 2015-09-22 ENCOUNTER — Ambulatory Visit (HOSPITAL_COMMUNITY)
Admission: RE | Admit: 2015-09-22 | Discharge: 2015-09-22 | Disposition: A | Discharge status: Home / Self Care | Payer: BLUE CROSS/BLUE SHIELD | Source: Ambulatory Visit | Attending: Cardiology | Admitting: Cardiology

## 2015-09-22 VITALS — BP 158/96 | HR 64 | Wt 234.5 lb

## 2015-09-22 DIAGNOSIS — Z79899 Other long term (current) drug therapy: Secondary | ICD-10-CM | POA: Insufficient documentation

## 2015-09-22 DIAGNOSIS — I110 Hypertensive heart disease with heart failure: Secondary | ICD-10-CM | POA: Diagnosis not present

## 2015-09-22 DIAGNOSIS — I5023 Acute on chronic systolic (congestive) heart failure: Secondary | ICD-10-CM

## 2015-09-22 DIAGNOSIS — I428 Other cardiomyopathies: Secondary | ICD-10-CM | POA: Insufficient documentation

## 2015-09-22 DIAGNOSIS — K269 Duodenal ulcer, unspecified as acute or chronic, without hemorrhage or perforation: Secondary | ICD-10-CM | POA: Insufficient documentation

## 2015-09-22 DIAGNOSIS — D649 Anemia, unspecified: Secondary | ICD-10-CM | POA: Diagnosis not present

## 2015-09-22 DIAGNOSIS — I5022 Chronic systolic (congestive) heart failure: Secondary | ICD-10-CM

## 2015-09-22 LAB — BASIC METABOLIC PANEL
ANION GAP: 9 (ref 5–15)
BUN: 9 mg/dL (ref 6–20)
CO2: 24 mmol/L (ref 22–32)
Calcium: 8.8 mg/dL — ABNORMAL LOW (ref 8.9–10.3)
Chloride: 107 mmol/L (ref 101–111)
Creatinine, Ser: 1.13 mg/dL (ref 0.61–1.24)
GLUCOSE: 102 mg/dL — AB (ref 65–99)
POTASSIUM: 4.1 mmol/L (ref 3.5–5.1)
Sodium: 140 mmol/L (ref 135–145)

## 2015-09-22 MED ORDER — DIGOXIN 125 MCG PO TABS: 0.1250 mg | ORAL_TABLET | Freq: Every day | ORAL | Status: DC

## 2015-09-22 MED ORDER — SPIRONOLACTONE 25 MG PO TABS: 25.0000 mg | ORAL_TABLET | Freq: Every day | ORAL | Status: DC

## 2015-09-22 MED ORDER — CARVEDILOL 12.5 MG PO TABS: 12.5000 mg | ORAL_TABLET | Freq: Two times a day (BID) | ORAL | Status: DC

## 2015-09-22 MED ORDER — FUROSEMIDE 40 MG PO TABS: 40.0000 mg | ORAL_TABLET | Freq: Two times a day (BID) | ORAL | Status: DC

## 2015-09-22 MED ORDER — ISOSORBIDE MONONITRATE ER 60 MG PO TB24: 60.0000 mg | ORAL_TABLET | Freq: Every day | ORAL | Status: DC

## 2015-09-22 MED ORDER — OMEPRAZOLE 20 MG PO TBEC: 20.0000 mg | DELAYED_RELEASE_TABLET | Freq: Every day | ORAL | Status: DC

## 2015-09-22 MED ORDER — HYDRALAZINE HCL 50 MG PO TABS: 50.0000 mg | ORAL_TABLET | Freq: Three times a day (TID) | ORAL | Status: DC

## 2015-09-22 MED ORDER — MAGNESIUM OXIDE 400 (241.3 MG) MG PO TABS: 400.0000 mg | ORAL_TABLET | Freq: Two times a day (BID) | ORAL | Status: DC

## 2015-09-22 MED FILL — ?FUROSEMIDE 40 MG TABLET: 40 | 30 days supply | Qty: 60 | Fill #0

## 2015-09-22 MED FILL — ?OMEPRAZOLE DR 20 MG CAPSUL: 20 | 30 days supply | Qty: 30 | Fill #0

## 2015-09-22 MED FILL — ISOSORBIDE MN ER 60 MG TAB: 60 | 30 days supply | Qty: 30 | Fill #0

## 2015-09-22 MED FILL — hydrALAZINE HCL 50 MG TABS: 50 | 30 days supply | Qty: 90 | Fill #0

## 2015-09-22 MED FILL — ?DIGITEK 125 MCG TABLET: 125 | 30 days supply | Qty: 30 | Fill #0

## 2015-09-22 MED FILL — SPIRONOLACTONE 25 MG TABLET: 25 | 30 days supply | Qty: 30 | Fill #0

## 2015-09-22 NOTE — Patient Instructions (Addendum)
Take Lasix 40mg  twice daily.   Routine lab work today. Will notify you of abnormal results  Repeat labs: bmet, dig  Follow up in  2 weeks with Dr.McLean

## 2015-09-23 NOTE — Progress Notes (Signed)
Patient ID: Kyle Conrad, male   DOB: 1965/07/12, 50 y.o.   MRN: [id number] PCP: [facility] Cardiology: Dr. Aundra Dubin  50 y.o. with history of nonischemic cardiomyopathy presents for cardiology followup. He was initially diagnosed with CHF in 2010.  He was admitted then and treated for CHF exacerbation, EF was low.  However, on repeat echo in 2010, EF was back to normal and he was lost to followup.  He was admitted in 12/16 with acute systolic CHF.  Dyspnea had gradually built up and he had anasarca.  Echo showed EF 10-15% with diffuse hypokinesis, moderate RV dysfunction, and moderate to severe MR.  Cardiac output was low and he was started on milrinone gtt and IV Lasix.  He was diuresed extensively and had coronary angiography showing no CAD.  He was weaned off milrinone and sent home.  During this stay, he was also noted to have iron deficiency anemia and FOBT+.  Colonscopy showed diverticulosis and EGD showed a nonbleeding duodenal ulcer.  He had hypoalbuminemia, elevated INR, and ascites but imaging of the liver was not suggestive of cirrhosis.  Cardiac MRI done to followup in 4/17 showed EF improved to 40% with no late gadolinium enhancement.   He has been doing well symptomatically.  However, he ran out of all his meds over the weekend.  He was taking everything until that time.  Weight is up a lot, about 20 lbs.  He has been exercising a lot, lifting weights in the gym and walking on the treadmill.  He rides his bike outside. He denies exertional dyspnea, chest pain, orthopnea/PND.    Labs (12/16): K 4.2, creatinine 0.93, HCT 28.6, albumin 2, LFTs normal, SPEP negative, UPEP negative.  Labs (1/17): K 4.1, creatinine 0.78, HCT 30.1, BNP 1354, UA negative for protein.  Labs (5/17): K 4.1, creatinine 1.13  ECG: NSR, low voltage (12/16)  PMH: 1. H/o angioedema with ACEI 2. Hypoalbuminemia 3. CHF: Nonischemic cardiomyopathy.  1st found in 2010.  Treated for cardiomyopathy and followup echo in 2010 showed  recovery in EF to normal.  Admitted with acute systolic CHF in Q000111Q.  Echo (12/16) with EF 10-15%, severe LV dilation, moderate-severe MR, RV moderately dilated with moderately decreased systolic function.  LHC/RHC (12/16) with mean RA 6, PA 42/21 mean 33, mean PCWP 14, CI 5.1 Fick/3.6 thermo, PVR 2.84.  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  SPEP/UPEP negative.  - Cardiac MRI (4/17): EF 40%, diffuse hypokinesis, mild LVH, normal RV size and systolic function, no LGE.  4. Anemia: Fe-deficiency.  FOBT+.  Colonoscopy 12/16 with diverticulosis.  EGD with small, nonbleeding duodenal ulcer.   5. HTN 6. ?Cirrhosis: Elevated INR with ascites and hypoalbuminemia at 12/16 admission.  CT abdomen showed normal liver contour.   7. R>L arm edema with normal upper extremity venous dopplers in 12/16.   SH: Lives alone in [place].  No ETOH or smoking.    FH: No premature CAD, no CHF that he knows of.   ROS: All systems reviewed and negative except as per HPI.    Current Outpatient Prescriptions  Medication Sig Dispense Refill  . Omeprazole 20 MG TBEC Take 1 tablet (20 mg total) by mouth daily. 30 each 3  . carvedilol (COREG) 12.5 MG tablet Take 1 tablet (12.5 mg total) by mouth 2 (two) times daily with a meal. 60 tablet 2  . digoxin (LANOXIN) 0.125 MG tablet Take 1 tablet (0.125 mg total) by mouth daily. 30 tablet 3  . furosemide (LASIX) 40  MG tablet Take 1 tablet (40 mg total) by mouth 2 (two) times daily. 60 tablet 3  . hydrALAZINE (APRESOLINE) 50 MG tablet Take 1 tablet (50 mg total) by mouth 3 (three) times daily. 90 tablet 3  . isosorbide mononitrate (IMDUR) 60 MG 24 hr tablet Take 1 tablet (60 mg total) by mouth daily. 30 tablet 3  . magnesium oxide (MAG-OX) 400 (241.3 Mg) MG tablet Take 1 tablet (400 mg total) by mouth 2 (two) times daily. 60 tablet 1  . spironolactone (ALDACTONE) 25 MG tablet Take 1 tablet (25 mg total) by mouth daily. 30 tablet 3   No current  facility-administered medications for this encounter.   BP 158/96 mmHg  Pulse 64  Wt 234 lb 8 oz (106.369 kg)  SpO2 97% General: NAD Neck: JVP 8 cm, no thyromegaly or thyroid nodule.  Lungs: Clear to auscultation bilaterally with normal respiratory effort. CV: Nondisplaced PMI.  Heart regular S1/S2, no S3/S4, no murmur.  Trace ankle edema.  No carotid bruit.  Normal pedal pulses.  Abdomen: Soft, nontender, no hepatosplenomegaly, no distention.  Skin: Intact without lesions or rashes.  Neurologic: Alert and oriented x 3.  Psych: Normal affect. Extremities: No clubbing or cyanosis.  HEENT: Normal.   Assessment/Plan: 1. Chronic systolic CHF: Nonischemic cardiomyopathy.  Echo with EF 10-15% and moderate RV dysfunction.  Coronary angiography 12/16 without significant CAD.  EF improved to 40% on cardiac MRI in 4/17. Cause uncertain.  He carries history of HTN but BP has not been high.  No family history of CHF.  SPEP/UPEP negative.  No evidence for pulmonary sarcoidosis on chest CT.  Possible viral myocarditis.  Cardiac MRI showed no late gadolinium enhancement.  Weight is up a lot, but he has only NYHA class II symptoms and mild volume overload.   He is out of his meds currently, but I am optimistic given improved echo in 4/17 that his LV systolic function will continue to improve if he can stay on his medications.  - He is currently out of range for ICD.   - Continue digoxin, check level today.  - Restart Coreg, Imdur, hydralazine, spironolactone.  - Holding off on ACEI/ARBARNI for now given angioedema with ACEI, may consider ARB in future.  - Continue low sodium diet. - Increase Lasix to 40 mg bid.  BMET today and in 2 wks.  2. Duodenal ulcer/anemia/FOBT+: Continue omeprazole.  3. ?Cirrhosis: Some concern for cirrhosis based on low albumin, high INR, and ascites at last admission, but liver did not appear cirrhotic on CT.   Followup in 2 wks.   Loralie Champagne 09/23/2015

## 2015-10-02 ENCOUNTER — Ambulatory Visit (HOSPITAL_COMMUNITY)
Admission: RE | Admit: 2015-10-02 | Discharge: 2015-10-02 | Disposition: A | Discharge status: Home / Self Care | Payer: BLUE CROSS/BLUE SHIELD | Source: Ambulatory Visit | Attending: Cardiology | Admitting: Cardiology

## 2015-10-02 DIAGNOSIS — I5021 Acute systolic (congestive) heart failure: Secondary | ICD-10-CM

## 2015-10-02 LAB — BASIC METABOLIC PANEL
Anion gap: 10 (ref 5–15)
BUN: 15 mg/dL (ref 6–20)
CALCIUM: 9.3 mg/dL (ref 8.9–10.3)
CHLORIDE: 105 mmol/L (ref 101–111)
CO2: 26 mmol/L (ref 22–32)
CREATININE: 1.38 mg/dL — AB (ref 0.61–1.24)
GFR calc Af Amer: >60 mL/min (ref >60)
GFR calc non Af Amer: 58 mL/min — ABNORMAL LOW (ref >60)
GLUCOSE: 124 mg/dL — AB (ref 65–99)
Potassium: 4.2 mmol/L (ref 3.5–5.1)
Sodium: 141 mmol/L (ref 135–145)

## 2015-10-07 ENCOUNTER — Ambulatory Visit (HOSPITAL_COMMUNITY)
Admission: RE | Admit: 2015-10-07 | Discharge: 2015-10-07 | Disposition: A | Discharge status: Home / Self Care | Payer: BLUE CROSS/BLUE SHIELD | Source: Ambulatory Visit | Attending: Cardiology | Admitting: Cardiology

## 2015-10-07 VITALS — BP 150/82 | HR 73 | Wt 233.5 lb

## 2015-10-07 DIAGNOSIS — I5023 Acute on chronic systolic (congestive) heart failure: Secondary | ICD-10-CM

## 2015-10-07 DIAGNOSIS — R188 Other ascites: Secondary | ICD-10-CM | POA: Diagnosis not present

## 2015-10-07 DIAGNOSIS — I5022 Chronic systolic (congestive) heart failure: Secondary | ICD-10-CM | POA: Insufficient documentation

## 2015-10-07 DIAGNOSIS — D649 Anemia, unspecified: Secondary | ICD-10-CM | POA: Diagnosis not present

## 2015-10-07 DIAGNOSIS — K269 Duodenal ulcer, unspecified as acute or chronic, without hemorrhage or perforation: Secondary | ICD-10-CM | POA: Insufficient documentation

## 2015-10-07 DIAGNOSIS — I110 Hypertensive heart disease with heart failure: Secondary | ICD-10-CM | POA: Insufficient documentation

## 2015-10-07 DIAGNOSIS — I429 Cardiomyopathy, unspecified: Secondary | ICD-10-CM | POA: Diagnosis not present

## 2015-10-07 LAB — BASIC METABOLIC PANEL
ANION GAP: 6 (ref 5–15)
BUN: 8 mg/dL (ref 6–20)
CHLORIDE: 106 mmol/L (ref 101–111)
CO2: 25 mmol/L (ref 22–32)
Calcium: 9.2 mg/dL (ref 8.9–10.3)
Creatinine, Ser: 1.17 mg/dL (ref 0.61–1.24)
GFR calc Af Amer: >60 mL/min (ref >60)
GLUCOSE: 89 mg/dL (ref 65–99)
POTASSIUM: 3.8 mmol/L (ref 3.5–5.1)
SODIUM: 137 mmol/L (ref 135–145)

## 2015-10-07 LAB — DIGOXIN LEVEL: DIGOXIN LVL: 0.3 ng/mL — AB (ref 0.8–2.0)

## 2015-10-07 MED ORDER — FUROSEMIDE 40 MG PO TABS: 40.0000 mg | ORAL_TABLET | Freq: Every day | ORAL | Status: DC

## 2015-10-07 MED ORDER — CARVEDILOL 12.5 MG PO TABS: 18.7500 mg | ORAL_TABLET | Freq: Two times a day (BID) | ORAL | Status: DC

## 2015-10-07 NOTE — Patient Instructions (Addendum)
Increase Carvedilol to 18.75 mg Twice daily   Decrease Furosemide to 40 mg ONCE DAILY  Labs today  You have been referred to [facility]  Your physician recommends that you schedule a follow-up appointment in: 3 months

## 2015-10-08 NOTE — Progress Notes (Signed)
Patient ID: Kyle Conrad, male   DOB: 1965-05-31, 50 y.o.   MRN: [id number] PCP: [facility] Cardiology: Dr. Aundra Dubin  50 yo with history of nonischemic cardiomyopathy presents for cardiology followup. He was initially diagnosed with CHF in 2010.  He was admitted then and treated for CHF exacerbation, EF was low.  However, on repeat echo in 2010, EF was back to normal and he was lost to followup.  He was admitted in 12/16 with acute systolic CHF.  Dyspnea had gradually built up and he had anasarca.  Echo showed EF 10-15% with diffuse hypokinesis, moderate RV dysfunction, and moderate to severe MR.  Cardiac output was low and he was started on milrinone gtt and IV Lasix.  He was diuresed extensively and had coronary angiography showing no CAD.  He was weaned off milrinone and sent home.  During this stay, he was also noted to have iron deficiency anemia and FOBT+.  Colonscopy showed diverticulosis and EGD showed a nonbleeding duodenal ulcer.  He had hypoalbuminemia, elevated INR, and ascites but imaging of the liver was not suggestive of cirrhosis.  Cardiac MRI done to followup in 4/17 showed EF improved to 40% with no late gadolinium enhancement.   He has been doing well symptomatically.  He is now taking all his meds.  BP is still high.  Weight is down 1 lb.  He has been exercising a lot, lifting weights in the gym and walking on the treadmill.  He rides his bike outside for 3 miles or so. He denies exertional dyspnea, chest pain, orthopnea/PND.    Labs (12/16): K 4.2, creatinine 0.93, HCT 28.6, albumin 2, LFTs normal, SPEP negative, UPEP negative.  Labs (1/17): K 4.1, creatinine 0.78, HCT 30.1, BNP 1354, UA negative for protein.  Labs (5/17): K 4.1 => 4.2, creatinine 1.13 => 1.38  ECG: NSR, low voltage (12/16)  PMH: 1. H/o angioedema with ACEI 2. Hypoalbuminemia 3. CHF: Nonischemic cardiomyopathy.  1st found in 2010.  Treated for cardiomyopathy and followup echo in 2010 showed recovery in EF to normal.   Admitted with acute systolic CHF in Q000111Q.  Echo (12/16) with EF 10-15%, severe LV dilation, moderate-severe MR, RV moderately dilated with moderately decreased systolic function.  LHC/RHC (12/16) with mean RA 6, PA 42/21 mean 33, mean PCWP 14, CI 5.1 Fick/3.6 thermo, PVR 2.84.  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  SPEP/UPEP negative.  - Cardiac MRI (4/17): EF 40%, diffuse hypokinesis, mild LVH, normal RV size and systolic function, no LGE.  4. Anemia: Fe-deficiency.  FOBT+.  Colonoscopy 12/16 with diverticulosis.  EGD with small, nonbleeding duodenal ulcer.   5. HTN 6. ?Cirrhosis: Elevated INR with ascites and hypoalbuminemia at 12/16 admission.  CT abdomen showed normal liver contour.   7. R>L arm edema with normal upper extremity venous dopplers in 12/16.   SH: Lives alone in [place].  No ETOH or smoking.    FH: No premature CAD, no CHF that he knows of.   ROS: All systems reviewed and negative except as per HPI.    Current Outpatient Prescriptions  Medication Sig Dispense Refill  . carvedilol (COREG) 12.5 MG tablet Take 1.5 tablets (18.75 mg total) by mouth 2 (two) times daily with a meal. 90 tablet 2  . digoxin (LANOXIN) 0.125 MG tablet Take 1 tablet (0.125 mg total) by mouth daily. 30 tablet 3  . furosemide (LASIX) 40 MG tablet Take 1 tablet (40 mg total) by mouth daily. 60 tablet 3  . hydrALAZINE (APRESOLINE) 50  MG tablet Take 1 tablet (50 mg total) by mouth 3 (three) times daily. 90 tablet 3  . isosorbide mononitrate (IMDUR) 60 MG 24 hr tablet Take 1 tablet (60 mg total) by mouth daily. 30 tablet 3  . magnesium oxide (MAG-OX) 400 (241.3 Mg) MG tablet Take 1 tablet (400 mg total) by mouth 2 (two) times daily. 60 tablet 1  . Omeprazole 20 MG TBEC Take 1 tablet (20 mg total) by mouth daily. 30 each 3  . spironolactone (ALDACTONE) 25 MG tablet Take 1 tablet (25 mg total) by mouth daily. 30 tablet 3   No current facility-administered medications for this  encounter.   BP 150/82 mmHg  Pulse 73  Wt 233 lb 8 oz (105.915 kg)  SpO2 97% General: NAD Neck: No JVD, no thyromegaly or thyroid nodule.  Lungs: Clear to auscultation bilaterally with normal respiratory effort. CV: Nondisplaced PMI.  Heart regular S1/S2, no S3/S4, no murmur.  No edema.  No carotid bruit.  Normal pedal pulses.  Abdomen: Soft, nontender, no hepatosplenomegaly, no distention.  Skin: Intact without lesions or rashes.  Neurologic: Alert and oriented x 3.  Psych: Normal affect. Extremities: No clubbing or cyanosis.  HEENT: Normal.   Assessment/Plan: 1. Chronic systolic CHF: Nonischemic cardiomyopathy.  Echo with EF 10-15% and moderate RV dysfunction.  Coronary angiography 12/16 without significant CAD.  EF improved to 40% on cardiac MRI in 4/17. Cause uncertain.  He carries history of HTN, possible hypertensive cardiomyopathy.  No family history of CHF.  SPEP/UPEP negative.  No evidence for pulmonary sarcoidosis on chest CT.  Possible viral myocarditis.  Cardiac MRI showed no late gadolinium enhancement. He is now taking all his meds.  NYHA class II.  Not volume overloaded. - He is currently out of range for ICD.   - Continue digoxin, check level today.  - Increase Coreg to 18.75 mg bid.  - Continue hydralazine/Imdur, spironolactone.  - Holding off on ACEI/ARBARNI for now given angioedema with ACEI, may consider ARB in future.  - Continue low sodium diet. - I think he can try decreasing Lasix to 40 mg daily. - BMET/BNP today.   2. Duodenal ulcer/anemia/FOBT+: Continue omeprazole.  3. ?Cirrhosis: Some concern for cirrhosis based on low albumin, high INR, and ascites at last admission, but liver did not appear cirrhotic on CT.   Followup in 1 month in pharmacy med clinic to increase hydralazine/Imdur, and followup in 3 months with me.   Loralie Champagne 10/08/2015

## 2015-11-09 ENCOUNTER — Ambulatory Visit (HOSPITAL_COMMUNITY)
Admission: RE | Admit: 2015-11-09 | Discharge: 2015-11-09 | Disposition: A | Discharge status: Home / Self Care | Payer: BLUE CROSS/BLUE SHIELD | Source: Ambulatory Visit | Attending: Internal Medicine | Admitting: Internal Medicine

## 2015-11-09 VITALS — BP 140/90 | HR 71 | Wt 238.2 lb

## 2015-11-09 DIAGNOSIS — Z79899 Other long term (current) drug therapy: Secondary | ICD-10-CM | POA: Insufficient documentation

## 2015-11-09 DIAGNOSIS — I5023 Acute on chronic systolic (congestive) heart failure: Secondary | ICD-10-CM | POA: Insufficient documentation

## 2015-11-09 DIAGNOSIS — I428 Other cardiomyopathies: Secondary | ICD-10-CM | POA: Insufficient documentation

## 2015-11-09 DIAGNOSIS — R42 Dizziness and giddiness: Secondary | ICD-10-CM | POA: Diagnosis not present

## 2015-11-09 LAB — BASIC METABOLIC PANEL
Anion gap: 6 (ref 5–15)
BUN: 10 mg/dL (ref 6–20)
CHLORIDE: 107 mmol/L (ref 101–111)
CO2: 25 mmol/L (ref 22–32)
Calcium: 8.9 mg/dL (ref 8.9–10.3)
Creatinine, Ser: 1.18 mg/dL (ref 0.61–1.24)
GFR calc non Af Amer: >60 mL/min (ref >60)
Glucose, Bld: 109 mg/dL — ABNORMAL HIGH (ref 65–99)
Potassium: 3.8 mmol/L (ref 3.5–5.1)
Sodium: 138 mmol/L (ref 135–145)

## 2015-11-09 MED ORDER — ISOSORBIDE MONONITRATE ER 60 MG PO TB24: 90.0000 mg | ORAL_TABLET | Freq: Every day | ORAL | Status: DC

## 2015-11-09 MED ORDER — HYDRALAZINE HCL 50 MG PO TABS: 75.0000 mg | ORAL_TABLET | Freq: Three times a day (TID) | ORAL | Status: DC

## 2015-11-09 MED ORDER — FUROSEMIDE 40 MG PO TABS: 40.0000 mg | ORAL_TABLET | Freq: Every day | ORAL | Status: DC

## 2015-11-09 MED FILL — hydrALAZINE HCL 50 MG TABS: 50 | 30 days supply | Qty: 135 | Fill #0

## 2015-11-09 MED FILL — ISOSORBIDE MN ER 60 MG TAB: 60 | 30 days supply | Qty: 45 | Fill #0

## 2015-11-09 NOTE — Patient Instructions (Signed)
It was great to see you!  Please INCREASE your hydralazine to 75 mg (1 and 1/2 tablets) three times a day.  Please INCREASE your isosorbide mononitrate to 90 mg (1 and 1/2 tablets) once daily.   Please take an additional 20 mg (1/2 tablet) of furosemide for weight gain >3 lbs in 1 day or >5 lbs in 1 week.  Please keep your appointment with Dr. Aundra Dubin on 8/29.

## 2015-11-09 NOTE — Progress Notes (Signed)
HPI:  Mr. Marra is a pleasant 50 yo AA male with history of nonischemic HFrEF (20-25% by ECHO in 04/2015). He was initially diagnosed with CHF with reduced EF in 2010.However, on repeat echo, EF was back to normal and he was lost to followup. He was admitted in 12/16 with acute systolic CHF with EF [id number]. Cardiac output was low and he was started on milrinone gtt and IV Lasix. He was diuresed extensively and had coronary angiography showing no CAD. He was weaned off milrinone and sent home. During this stay, he was also noted to have iron deficiency anemia and FOBT+. Colonscopy showed diverticulosis and EGD showed a nonbleeding duodenal ulcer. He had hypoalbuminemia, elevated INR, and ascites but imaging of the liver was not suggestive of cirrhosis. Cardiac MRI done to followup in 4/17 showed EF improved to 40% with no late gadolinium enhancement.   He presents today for pharmacist-led HF medication titration. At last HF clinic visit his furosemide was decreased to 40 mg daily from BID and carvedilol was increased to 18.75 mg BID from 12.5 mg BID. He has been doing well symptomatically. He states that he is now taking all his meds but admits to missing his noon dose of hydralazine 2-3 times per week. BP is improved but still not at goal of SBP <130 mmHg in accordance with the 2017 ACC/AHA/HFSA HF guideline focused update. Weight up 5 lbs since last visit 1 month ago. He attributes this to muscle gain since has been exercising a lot, lifting more weights in the gym and walking on the treadmill.He rides his bike outside for 15-20 miles about 2-3 times per week. He denies exertional dyspnea, chest pain, orthopnea/PND. Awaiting new CPAP mask.     Marland Kitchen Shortness of breath/dyspnea on exertion? No  . Orthopnea/PND? No . Edema? No . Lightheadedness/dizziness? Yes - a few minutes after taking meds  . Daily weights at home? No - weighs every 3-4 days (at home closer to 230 lbs but stable)  . Blood  pressure/heart rate monitoring at home? No . Following low-sodium/fluid-restricted diet? No - does not add salt and mostly eats veggie burgers and roasted chicken/turkey but does admit to eating [employer] veggie burritos and [employer]; does not fluid restrict and drinks tea and water  HF Medications: Carvedilol 18.75 mg PO BID Digoxin 0.125 mg PO daily Furosemide 40 mg PO daily Hydralazine 50 mg PO TID Isosorbide mononitrate 60 mg PO daily Spironolactone 25 mg PO daily   Has the patient been experiencing any side effects to the medications prescribed?  Yes - lightheadedness after taking medications in the morning but improved since staggering times  Does the patient have any problems obtaining medications due to transportation or finances?   Yes - working on disability/Medicaid   Understanding of regimen: good Understanding of indications: good Potential of compliance: fair Patient understands to avoid NSAIDs. Patient understands to avoid decongestants.    Pertinent Lab Values: . 11/09/15: Serum creatinine 1.18 (BL ~1), CO2 25, Potassium 3.8, Sodium 138 . 10/07/15: Digoxin 0.3   Vital Signs: . Weight: 238.2 lb (Last HFc weight: 233 lbs) . Blood pressure: 140/90 mmHg  . Heart rate: 71 bpm    Heart Failure Assessment & Plan: . Ejection fraction: 20-25% (ECHO on 04/24/2015) . NYHA symptom class: I-II . Based on clinical presentation, vital signs, and recent labs, will recommend to increase hydralazine to 75 mg TID and isosorbide mononitrate to 90 mg daily, take an additional 20 mg of furosemide for weight gain >3  lbs in 1 day or >5 lbs in 1 week  Summary: 1. Chronic systolic HFrEF (0000000): Nonischemic cardiomyopathy. EF improved to 40% on cardiac MRI in 4/17.NYHA  Cause uncertain. NYHA class I-II. Not significantly volume overloaded.  - Continue digoxin 0.125 mg daily, carvedilol 18.75 mg bid, spironolactone 25 mg daily, furosemide 40 mg daily (take additional 20 mg for weight gain >3  lbs in 1 day or >5 lbs in 1 week) - Increase hydralazine to 75 mg TID and isosorbide mononitrate to 90 mg daily - BMET today with recent decrease in furosemide and slight weight gain - Holding off on ACEI/ARBARNI for now given angioedema with ACEI, may consider ARB in future - Once approved for Medicaid, can consider switch to Bidil which should help with compliance - Basic disease state pathophysiology, medication indication, mechanism and side effects reviewed at length with patient and she verbalized understanding  Plan:  1) Medication changes: Increase hydralazine to 75 mg PO TID and isosorbide mononitrate to 90 mg daily, take an additional 20 mg of furosemide for weight >3 lbs in 1 day or >5 lbs in 1 week 2) Labs: BMET today  3) Follow-up: Dr. Aundra Dubin on 8/29   Ruta Hinds. Velva Harman, PharmD, BCPS, CPP Clinical Pharmacist Pager: [phone] Phone: [phone] 11/09/2015 1:47 PM

## 2015-11-18 MED FILL — SPIRONOLACTONE 25 MG TABLET: 25 | 30 days supply | Qty: 30 | Fill #1

## 2015-11-18 MED FILL — ?DIGITEK 125 MCG TABLET: 125 | 30 days supply | Qty: 30 | Fill #1

## 2015-11-18 MED FILL — ?OMEPRAZOLE DR 20 MG CAPSUL: 20 | 30 days supply | Qty: 30 | Fill #1

## 2016-01-12 ENCOUNTER — Encounter (HOSPITAL_COMMUNITY): Payer: BLUE CROSS/BLUE SHIELD

## 2016-01-12 ENCOUNTER — Telehealth (HOSPITAL_COMMUNITY): Payer: Self-pay | Admitting: Vascular Surgery

## 2016-01-12 NOTE — Telephone Encounter (Signed)
Returned pt call. left message tp call back to get rescheduled, pt called to cancel appt w/ [facility]

## 2016-01-20 MED FILL — ISOSORBIDE MN ER 60 MG TAB: 60 | 30 days supply | Qty: 45 | Fill #1

## 2016-01-20 MED FILL — DIGITEK 125 MCG TABLET: 125 | 30 days supply | Qty: 30 | Fill #2

## 2016-01-20 MED FILL — ?SPIRONOLACTONE 25 MG TABLE: 25 MG | 30 days supply | Qty: 30 | Fill #2

## 2016-01-20 MED FILL — ?OMEPRAZOLE DR 20 MG CAPSUL: 20 | 30 days supply | Qty: 30 | Fill #2

## 2016-01-20 MED FILL — ?HYDRALAZINE 50 MG TABLET: 50 | 30 days supply | Qty: 135 | Fill #1

## 2016-02-09 ENCOUNTER — Other Ambulatory Visit (HOSPITAL_COMMUNITY): Payer: Self-pay | Admitting: *Deleted

## 2016-02-09 DIAGNOSIS — I5023 Acute on chronic systolic (congestive) heart failure: Secondary | ICD-10-CM

## 2016-02-09 MED ORDER — CARVEDILOL 12.5 MG PO TABS: 18.7500 mg | ORAL_TABLET | Freq: Two times a day (BID) | ORAL | 2 refills | Status: DC

## 2016-02-09 MED FILL — CARVEDILOL 12.5 MG TABLET: 12.5 | 30 days supply | Qty: 90 | Fill #0

## 2016-02-16 ENCOUNTER — Ambulatory Visit (HOSPITAL_COMMUNITY)
Admission: RE | Admit: 2016-02-16 | Discharge: 2016-02-16 | Disposition: A | Discharge status: Home / Self Care | Payer: BLUE CROSS/BLUE SHIELD | Source: Ambulatory Visit | Attending: Cardiology | Admitting: Cardiology

## 2016-02-16 ENCOUNTER — Encounter (HOSPITAL_COMMUNITY): Payer: Self-pay

## 2016-02-16 VITALS — BP 144/90 | HR 81 | Wt 245.8 lb

## 2016-02-16 DIAGNOSIS — I110 Hypertensive heart disease with heart failure: Secondary | ICD-10-CM | POA: Diagnosis not present

## 2016-02-16 DIAGNOSIS — I5022 Chronic systolic (congestive) heart failure: Secondary | ICD-10-CM | POA: Diagnosis not present

## 2016-02-16 DIAGNOSIS — I429 Cardiomyopathy, unspecified: Secondary | ICD-10-CM | POA: Insufficient documentation

## 2016-02-16 DIAGNOSIS — I5023 Acute on chronic systolic (congestive) heart failure: Secondary | ICD-10-CM

## 2016-02-16 DIAGNOSIS — K269 Duodenal ulcer, unspecified as acute or chronic, without hemorrhage or perforation: Secondary | ICD-10-CM | POA: Insufficient documentation

## 2016-02-16 DIAGNOSIS — G4733 Obstructive sleep apnea (adult) (pediatric): Secondary | ICD-10-CM | POA: Insufficient documentation

## 2016-02-16 DIAGNOSIS — I10 Essential (primary) hypertension: Secondary | ICD-10-CM | POA: Diagnosis not present

## 2016-02-16 LAB — BASIC METABOLIC PANEL
Anion gap: 7 (ref 5–15)
BUN: 12 mg/dL (ref 6–20)
CALCIUM: 9.2 mg/dL (ref 8.9–10.3)
CO2: 22 mmol/L (ref 22–32)
CREATININE: 1.14 mg/dL (ref 0.61–1.24)
Chloride: 109 mmol/L (ref 101–111)
Glucose, Bld: 130 mg/dL — ABNORMAL HIGH (ref 65–99)
Potassium: 4.2 mmol/L (ref 3.5–5.1)
SODIUM: 138 mmol/L (ref 135–145)

## 2016-02-16 LAB — BRAIN NATRIURETIC PEPTIDE: B Natriuretic Peptide: 12.9 pg/mL (ref 0.0–100.0)

## 2016-02-16 LAB — MAGNESIUM: Magnesium: 1.9 mg/dL (ref 1.7–2.4)

## 2016-02-16 MED ORDER — FUROSEMIDE 40 MG PO TABS: 40.0000 mg | ORAL_TABLET | Freq: Every day | ORAL | 3 refills | Status: DC

## 2016-02-16 MED ORDER — CARVEDILOL 12.5 MG PO TABS: 18.7500 mg | ORAL_TABLET | Freq: Two times a day (BID) | ORAL | 3 refills | Status: DC

## 2016-02-16 NOTE — Progress Notes (Signed)
Advanced Heart Failure Medication Review by a Pharmacist  Does the patient  feel that his/her medications are working for him/her?  yes  Has the patient been experiencing any side effects to the medications prescribed?  yes  Does the patient measure his/her own blood pressure or blood glucose at home?  no   Does the patient have any problems obtaining medications due to transportation or finances?   No - [employer] commercial   Understanding of regimen: good Understanding of indications: good Potential of compliance: fair Patient understands to avoid NSAIDs. Patient understands to avoid decongestants.  Issues to address at subsequent visits: Compliance   Pharmacist comments:  Kyle Conrad is a pleasant 50 yo M presenting without a medication list but with good recall of his regimen. He does admit to only taking his furosemide 1-2 times per week since it makes him tired when he takes it and can't always get to a bathroom during the day. He also states that he ran out of carvedilol at least 2 days ago and was only taking 12.5 mg BID instead of 18.75 mg BID 2/2 confusion at the pharmacy. He did not have any other medication-related questions or concerns for me at this time.   Ruta Hinds. Velva Harman, PharmD, BCPS, CPP Clinical Pharmacist Pager: [phone] Phone: [phone] 02/16/2016 1:50 PM      Time with patient: 10 minutes Preparation and documentation time: 2 minutes Total time: 12 minutes

## 2016-02-16 NOTE — Patient Instructions (Signed)
Increase Carvedilol to 18.75 mg (1 & 1/2 tabs) Twice daily   Take Furosemide (Lasix) 40 mg daily  Labs today  Labs in 2 weeks  Your physician recommends that you schedule a follow-up appointment in: 2 weeks

## 2016-02-17 NOTE — Progress Notes (Signed)
Patient ID: Kyle Conrad, male   DOB: 30-Jun-1965, 50 y.o.   MRN: [id number] PCP: [name] Cardiology: Dr. Aundra Dubin  50 yo with history of nonischemic cardiomyopathy presents for cardiology followup. He was initially diagnosed with CHF in 2010.  He was admitted then and treated for CHF exacerbation, EF was low.  However, on repeat echo in 2010, EF was back to normal and he was lost to followup.  He was admitted in 12/16 with acute systolic CHF.  Dyspnea had gradually built up and he had anasarca.  Echo showed EF 10-15% with diffuse hypokinesis, moderate RV dysfunction, and moderate to severe MR.  Cardiac output was low and he was started on milrinone gtt and IV Lasix.  He was diuresed extensively and had coronary angiography showing no CAD.  He was weaned off milrinone and sent home.  During this stay, he was also noted to have iron deficiency anemia and FOBT+.  Colonscopy showed diverticulosis and EGD showed a nonbleeding duodenal ulcer.  He had hypoalbuminemia, elevated INR, and ascites but imaging of the liver was not suggestive of cirrhosis.  Cardiac MRI done to followup in 4/17 showed EF improved to 40% with no late gadolinium enhancement.   He has been out of Coreg for 3 days.  He does not take Lasix regularly.  Weight is up 12 lbs and BP is high.  He has noticed dyspnea with heavier exertion.  Still able to ride his bicycle with no problems.  Overall more fatigued.  Not on CPAP yet.  No orthopnea/PND.  No palpitations, no chest pain.    Labs (12/16): K 4.2, creatinine 0.93, HCT 28.6, albumin 2, LFTs normal, SPEP negative, UPEP negative.  Labs (1/17): K 4.1, creatinine 0.78, HCT 30.1, BNP 1354, UA negative for protein.  Labs (5/17): K 4.1 => 4.2, creatinine 1.13 => 1.38, digoxin 0.3 Labs (6/17): K 3.8, creatinine 1.18  ECG: NSR, low voltage (12/16)  PMH: 1. H/o angioedema with ACEI 2. Hypoalbuminemia 3. CHF: Nonischemic cardiomyopathy.  1st found in 2010.  Treated for cardiomyopathy and followup  echo in 2010 showed recovery in EF to normal.  Admitted with acute systolic CHF in Q000111Q.  Echo (12/16) with EF 10-15%, severe LV dilation, moderate-severe MR, RV moderately dilated with moderately decreased systolic function.  LHC/RHC (12/16) with mean RA 6, PA 42/21 mean 33, mean PCWP 14, CI 5.1 Fick/3.6 thermo, PVR 2.84.  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  SPEP/UPEP negative.  - Cardiac MRI (4/17): EF 40%, diffuse hypokinesis, mild LVH, normal RV size and systolic function, no LGE.  4. Anemia: Fe-deficiency.  FOBT+.  Colonoscopy 12/16 with diverticulosis.  EGD with small, nonbleeding duodenal ulcer.   5. HTN 6. ?Cirrhosis: Elevated INR with ascites and hypoalbuminemia at 12/16 admission.  CT abdomen showed normal liver contour.   7. R>L arm edema with normal upper extremity venous dopplers in 12/16.  8. OSA  SH: Lives alone in [place].  No ETOH or smoking.    FH: No premature CAD, no CHF that he knows of.   ROS: All systems reviewed and negative except as per HPI.    Current Outpatient Prescriptions  Medication Sig Dispense Refill  . digoxin (LANOXIN) 0.125 MG tablet Take 1 tablet (0.125 mg total) by mouth daily. 30 tablet 3  . furosemide (LASIX) 40 MG tablet Take 1 tablet (40 mg total) by mouth daily. Take an additional 20 mg (1/2 tablet) for 3 lb weight gain in 1 day or 5 lb weight gain in  1 week 60 tablet 3  . hydrALAZINE (APRESOLINE) 50 MG tablet Take 1.5 tablets (75 mg total) by mouth 3 (three) times daily. 135 tablet 5  . isosorbide mononitrate (IMDUR) 60 MG 24 hr tablet Take 1.5 tablets (90 mg total) by mouth daily. 45 tablet 5  . Omeprazole 20 MG TBEC Take 1 tablet (20 mg total) by mouth daily. 30 each 3  . OVER THE COUNTER MEDICATION Take 1 Bottle by mouth daily as needed (when cycling).    Marland Kitchen spironolactone (ALDACTONE) 25 MG tablet Take 1 tablet (25 mg total) by mouth daily. 30 tablet 3  . carvedilol (COREG) 12.5 MG tablet Take 1.5 tablets (18.75  mg total) by mouth 2 (two) times daily with a meal. 90 tablet 3   No current facility-administered medications for this encounter.    BP (!) 144/90   Pulse 81   Wt 245 lb 12 oz (111.5 kg)   SpO2 99%   BMI 33.33 kg/m  General: NAD Neck: JVP 8 cm, no thyromegaly or thyroid nodule.  Lungs: Clear to auscultation bilaterally with normal respiratory effort. CV: Nondisplaced PMI.  Heart regular S1/S2, no S3/S4, no murmur.  No edema.  No carotid bruit.  Normal pedal pulses.  Abdomen: Soft, nontender, no hepatosplenomegaly, no distention.  Skin: Intact without lesions or rashes.  Neurologic: Alert and oriented x 3.  Psych: Normal affect. Extremities: No clubbing or cyanosis.  HEENT: Normal.   Assessment/Plan: 1. Chronic systolic CHF: Nonischemic cardiomyopathy.  Echo with EF 10-15% and moderate RV dysfunction.  Coronary angiography 12/16 without significant CAD.  EF improved to 40% on cardiac MRI in 4/17. Cause uncertain.  He carries history of HTN, possible hypertensive cardiomyopathy.  No family history of CHF.  SPEP/UPEP negative.  No evidence for pulmonary sarcoidosis on chest CT.  Possible viral myocarditis.  Cardiac MRI showed no late gadolinium enhancement. He is now taking all his meds.  NYHA class II.  Weight is up and he is mildly volume overloaded.  He is not compliant with Lasix. - Start Lasix 40 mg daily, BMET/BNP today and again in 2 wks.  - He is currently out of range for ICD.   - Continue digoxin, check level today.  - Start back on Coreg 18.75 mg bid.   - Continue hydralazine/Imdur, spironolactone.  - Holding off on ACEI/ARB/ARNI for now given angioedema with ACEI, may consider ARB in future.  - Continue low sodium diet. - Echo at followup in 2 months.   2. Duodenal ulcer/anemia/FOBT+: Continue omeprazole.  3. ?Cirrhosis: Some concern for cirrhosis based on low albumin, high INR, and ascites at last admission, but liver did not appear cirrhotic on CT.   Followup in 2 months  with echo.   Loralie Champagne 02/17/2016

## 2016-03-03 MED FILL — ?SPIRONOLACTONE 25 MG TABLE: 25 MG | 30 days supply | Qty: 30 | Fill #3

## 2016-03-03 MED FILL — DIGITEK 125 MCG TABLET: 125 | 30 days supply | Qty: 30 | Fill #3

## 2016-03-03 MED FILL — ?OMEPRAZOLE DR 20 MG CAPSUL: 20 | 30 days supply | Qty: 30 | Fill #3

## 2016-03-03 MED FILL — ?HYDRALAZINE 50 MG TABLET: 50 | 30 days supply | Qty: 135 | Fill #2

## 2016-03-04 ENCOUNTER — Inpatient Hospital Stay (HOSPITAL_COMMUNITY): Admission: RE | Admit: 2016-03-04 | Payer: BLUE CROSS/BLUE SHIELD | Source: Ambulatory Visit

## 2016-03-11 ENCOUNTER — Ambulatory Visit: Payer: BLUE CROSS/BLUE SHIELD | Admitting: Family Medicine

## 2016-04-15 ENCOUNTER — Other Ambulatory Visit (HOSPITAL_COMMUNITY): Payer: Self-pay | Admitting: Cardiology

## 2016-04-15 DIAGNOSIS — I5023 Acute on chronic systolic (congestive) heart failure: Secondary | ICD-10-CM

## 2016-04-15 MED FILL — ?SPIRONOLACTONE 25 MG TABLE: 25 MG | 30 days supply | Qty: 30 | Fill #0

## 2016-04-15 MED FILL — DIGITEK 125 MCG TABLET: 125 | 30 days supply | Qty: 30 | Fill #0

## 2016-04-15 MED FILL — ?OMEPRAZOLE DR 20 MG CAPSUL: 20 | 30 days supply | Qty: 30 | Fill #0

## 2016-04-18 ENCOUNTER — Ambulatory Visit (HOSPITAL_COMMUNITY)
Admission: RE | Admit: 2016-04-18 | Discharge: 2016-04-18 | Disposition: A | Discharge status: Home / Self Care | Payer: BLUE CROSS/BLUE SHIELD | Source: Ambulatory Visit | Attending: Cardiology | Admitting: Cardiology

## 2016-04-18 ENCOUNTER — Encounter (HOSPITAL_COMMUNITY): Payer: Self-pay

## 2016-04-18 ENCOUNTER — Ambulatory Visit (HOSPITAL_BASED_OUTPATIENT_CLINIC_OR_DEPARTMENT_OTHER)
Admission: RE | Admit: 2016-04-18 | Discharge: 2016-04-18 | Disposition: A | Discharge status: Home / Self Care | Payer: BLUE CROSS/BLUE SHIELD | Source: Ambulatory Visit | Attending: Cardiology | Admitting: Cardiology

## 2016-04-18 VITALS — BP 162/100 | HR 69 | Wt 250.1 lb

## 2016-04-18 DIAGNOSIS — I5022 Chronic systolic (congestive) heart failure: Secondary | ICD-10-CM | POA: Insufficient documentation

## 2016-04-18 DIAGNOSIS — G4733 Obstructive sleep apnea (adult) (pediatric): Secondary | ICD-10-CM | POA: Insufficient documentation

## 2016-04-18 DIAGNOSIS — I429 Cardiomyopathy, unspecified: Secondary | ICD-10-CM | POA: Insufficient documentation

## 2016-04-18 DIAGNOSIS — I5021 Acute systolic (congestive) heart failure: Secondary | ICD-10-CM

## 2016-04-18 DIAGNOSIS — I110 Hypertensive heart disease with heart failure: Secondary | ICD-10-CM | POA: Insufficient documentation

## 2016-04-18 DIAGNOSIS — K269 Duodenal ulcer, unspecified as acute or chronic, without hemorrhage or perforation: Secondary | ICD-10-CM | POA: Insufficient documentation

## 2016-04-18 DIAGNOSIS — I10 Essential (primary) hypertension: Secondary | ICD-10-CM

## 2016-04-18 LAB — BASIC METABOLIC PANEL
Anion gap: 6 (ref 5–15)
BUN: 7 mg/dL (ref 6–20)
CALCIUM: 9.3 mg/dL (ref 8.9–10.3)
CO2: 25 mmol/L (ref 22–32)
CREATININE: 1.11 mg/dL (ref 0.61–1.24)
Chloride: 107 mmol/L (ref 101–111)
GFR calc Af Amer: >60 mL/min (ref >60)
Glucose, Bld: 95 mg/dL (ref 65–99)
Potassium: 3.9 mmol/L (ref 3.5–5.1)
SODIUM: 138 mmol/L (ref 135–145)

## 2016-04-18 LAB — ECHOCARDIOGRAM COMPLETE
CHL CUP MV DEC (S): 232
E decel time: 232 msec
E/e' ratio: 6.76
FS: 22 % — AB (ref 28–44)
IVS/LV PW RATIO, ED: 0.87
LA ID, A-P, ES: 44 mm
LA diam end sys: 44 mm
LA vol: 76.9 mL
LADIAMINDEX: 1.89 cm/m2
LAVOLA4C: 78.4 mL
LAVOLIN: 33 mL/m2
LDCA: 4.15 cm2
LV TDI E'MEDIAL: 4.84
LVEEAVG: 6.76
LVEEMED: 6.76
LVELAT: 8.61 cm/s
LVOT diameter: 23 mm
Lateral S' vel: 15 cm/s
MV pk A vel: 84.4 m/s
MV pk E vel: 58.2 m/s
PW: 11.5 mm — AB (ref 0.6–1.1)
RV TAPSE: 26.4 mm
TDI e' lateral: 8.61

## 2016-04-18 LAB — DIGOXIN LEVEL: Digoxin Level: <0.2 ng/mL — ABNORMAL LOW (ref 0.8–2.0)

## 2016-04-18 MED ORDER — HYDRALAZINE HCL 100 MG PO TABS: 100.0000 mg | ORAL_TABLET | Freq: Three times a day (TID) | ORAL | 3 refills | Status: DC

## 2016-04-18 MED ORDER — CARVEDILOL 25 MG PO TABS: 25.0000 mg | ORAL_TABLET | Freq: Two times a day (BID) | ORAL | 3 refills | Status: DC

## 2016-04-18 MED FILL — CARVEDILOL 25 MG TABLET: 25 | 30 days supply | Qty: 60 | Fill #0

## 2016-04-18 MED FILL — hydrALAZINE HCL 100 MG TABS: 100 | 30 days supply | Qty: 90 | Fill #0

## 2016-04-18 NOTE — Progress Notes (Signed)
Patient ID: Kyle Conrad, male   DOB: 1965/10/21, 50 y.o.   MRN: [id number] PCP: Kyle Conrad Cardiology: Dr. Aundra Conrad  50 yo with history of nonischemic cardiomyopathy presents for cardiology followup. He was initially diagnosed with CHF in 2010.  He was admitted then and treated for CHF exacerbation, EF was low.  However, on repeat echo in 2010, EF was back to normal and he was lost to followup.  He was admitted in 12/16 with acute systolic CHF.  Dyspnea had gradually built up and he had anasarca.  Echo showed EF 10-15% with diffuse hypokinesis, moderate RV dysfunction, and moderate to severe MR.  Cardiac output was low and he was started on milrinone gtt and IV Lasix.  He was diuresed extensively and had coronary angiography showing no CAD.  He was weaned off milrinone and sent home.  During this stay, he was also noted to have iron deficiency anemia and FOBT+.  Colonscopy showed diverticulosis and EGD showed a nonbleeding duodenal ulcer.  He had hypoalbuminemia, elevated INR, and ascites but imaging of the liver was not suggestive of cirrhosis.  Cardiac MRI done to followup in 4/17 showed EF improved to 40% with no late gadolinium enhancement.   He is taking all his medications.  BP still running high.  Weight is up about 5 lbs.  Only rare lightheadedness.  No dyspnea walking on flat ground. Climbs stairs a lot at work, no problems.  No orthopnea/PND.  No palpitations, no chest pain.    Labs (12/16): K 4.2, creatinine 0.93, HCT 28.6, albumin 2, LFTs normal, SPEP negative, UPEP negative.  Labs (1/17): K 4.1, creatinine 0.78, HCT 30.1, BNP 1354, UA negative for protein.  Labs (5/17): K 4.1 => 4.2, creatinine 1.13 => 1.38, digoxin 0.3 Labs (6/17): K 3.8, creatinine 1.18 Labs (10/17): K 4.2, creatinine 1.14, BNP 13  PMH: 1. H/o angioedema with ACEI 2. Hypoalbuminemia 3. CHF: Nonischemic cardiomyopathy.  1st found in 2010.  Treated for cardiomyopathy and followup echo in 2010 showed recovery in EF to  normal.  Admitted with acute systolic CHF in [id number].  Echo (12/16) with EF 10-15%, severe LV dilation, moderate-severe MR, RV moderately dilated with moderately decreased systolic function.  LHC/RHC (12/16) with mean RA 6, PA 42/21 mean 33, mean PCWP 14, CI 5.1 Fick/3.6 thermo, PVR 2.84.  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  SPEP/UPEP negative.  - Cardiac MRI (4/17): EF 40%, diffuse hypokinesis, mild LVH, normal RV size and systolic function, no LGE.  - Echo (12/17): EF 40-45%, mild MR, mild RV dilation with normal RV systolic function.  4. Anemia: Fe-deficiency.  FOBT+.  Colonoscopy 12/16 with diverticulosis.  EGD with small, nonbleeding duodenal ulcer.   5. HTN 6. ?Cirrhosis: Elevated INR with ascites and hypoalbuminemia at 12/16 admission.  CT abdomen showed normal liver contour.   7. R>L arm edema with normal upper extremity venous dopplers in 12/16.  8. OSA  SH: Lives alone in [place].  No ETOH or smoking.  Works as Designer, jewellery.   FH: No premature CAD, no CHF that he knows of.   ROS: All systems reviewed and negative except as per HPI.    Current Outpatient Prescriptions  Medication Sig Dispense Refill  . carvedilol (COREG) 25 MG tablet Take 1 tablet (25 mg total) by mouth 2 (two) times daily with a meal. 60 tablet 3  . DIGITEK 125 MCG tablet TAKE 1 TABLET BY MOUTH DAILY 30 tablet 3  . furosemide (LASIX) 40 MG tablet Take 1 tablet (  40 mg total) by mouth daily. Take an additional 20 mg (1/2 tablet) for 3 lb weight gain in 1 day or 5 lb weight gain in 1 week 60 tablet 3  . hydrALAZINE (APRESOLINE) 100 MG tablet Take 1 tablet (100 mg total) by mouth 3 (three) times daily. 90 tablet 3  . isosorbide mononitrate (IMDUR) 60 MG 24 hr tablet Take 1.5 tablets (90 mg total) by mouth daily. 45 tablet 5  . omeprazole (PRILOSEC) 20 MG capsule TAKE 1 CAPSULE BY MOUTH DAILY 30 capsule 3  . OVER THE COUNTER MEDICATION Take 1 Bottle by mouth daily as needed (when  cycling).    Marland Kitchen spironolactone (ALDACTONE) 25 MG tablet TAKE 1 TABLET BY MOUTH DAILY. 30 tablet 3   No current facility-administered medications for this encounter.    BP (!) 162/100   Pulse 69   Wt 250 lb 1.9 oz (113.5 kg)   SpO2 97%   BMI 33.92 kg/m  General: NAD Neck: JVP 7 cm, no thyromegaly or thyroid nodule.  Lungs: Clear to auscultation bilaterally with normal respiratory effort. CV: Nondisplaced PMI.  Heart regular S1/S2, no S3/S4, no murmur.  No edema.  No carotid bruit.  Normal pedal pulses.  Abdomen: Soft, nontender, no hepatosplenomegaly, no distention.  Skin: Intact without lesions or rashes.  Neurologic: Alert and oriented x 3.  Psych: Normal affect. Extremities: No clubbing or cyanosis.  HEENT: Normal.   Assessment/Plan: 1. Chronic systolic CHF: Nonischemic cardiomyopathy.  Echo with EF 10-15% and moderate RV dysfunction.  Coronary angiography 12/16 without significant CAD.  EF improved to 40% on cardiac MRI in 4/17 and up to 40-45% on 12/17 echo. Cause uncertain.  He carries history of HTN, possible hypertensive cardiomyopathy.  No family history of CHF.  SPEP/UPEP negative.  No evidence for pulmonary sarcoidosis on chest CT.  Possible viral myocarditis.  Cardiac MRI showed no late gadolinium enhancement. He is now taking all his meds.  NYHA class II.  He is not volume overloaded.  - Continue current Lasix.  Check BMET.  - EF is currently out of range for ICD.   - Continue digoxin, check level today.  - Increase Coreg to 25 mg bid.  - Continue spironolactone.  - Increase hydralazine to 100 mg tid and continue Imdur.  - Holding off on ACEI/ARB/ARNI for now given angioedema with ACEI, may consider ARB in future.  - Continue low sodium diet. 2. Duodenal ulcer/anemia/FOBT+: Continue omeprazole.  3. ?Cirrhosis: Some concern for cirrhosis based on low albumin, high INR, and ascites at last admission, but liver did not appear cirrhotic on CT.  4. HTN: BP continues to run  high.  Increasing Coreg and hydralazine as above.   Followup in 6 wks with pharmacist for BP check and med adjustment.  Followup with me in 3 months.   Kyle Conrad 04/18/2016

## 2016-04-18 NOTE — Patient Instructions (Signed)
Labs today (will call for abnormal results, otherwise no news is good news)  Increase Coreg to 25 mg (1Tab) Two Times Daily  Increase Hydralazine to 100 mg (1 Tab) Three Times Daily  Follow up with PharMD in 6 weeks  Follow up in 3 months

## 2016-04-18 NOTE — Progress Notes (Signed)
  Echocardiogram 2D Echocardiogram has been performed.  Kyle Conrad 04/18/2016, 10:38 AM

## 2016-04-21 ENCOUNTER — Ambulatory Visit: Payer: BLUE CROSS/BLUE SHIELD | Admitting: Family Medicine

## 2016-05-30 ENCOUNTER — Ambulatory Visit (HOSPITAL_COMMUNITY)
Admission: RE | Admit: 2016-05-30 | Discharge: 2016-05-30 | Disposition: A | Discharge status: Home / Self Care | Payer: BLUE CROSS/BLUE SHIELD | Source: Ambulatory Visit | Attending: Cardiology | Admitting: Cardiology

## 2016-05-30 ENCOUNTER — Encounter (HOSPITAL_COMMUNITY): Payer: Self-pay

## 2016-05-30 DIAGNOSIS — I428 Other cardiomyopathies: Secondary | ICD-10-CM | POA: Insufficient documentation

## 2016-05-30 DIAGNOSIS — I509 Heart failure, unspecified: Secondary | ICD-10-CM | POA: Diagnosis not present

## 2016-05-30 DIAGNOSIS — D509 Iron deficiency anemia, unspecified: Secondary | ICD-10-CM | POA: Insufficient documentation

## 2016-05-30 DIAGNOSIS — I10 Essential (primary) hypertension: Secondary | ICD-10-CM | POA: Insufficient documentation

## 2016-05-30 DIAGNOSIS — K269 Duodenal ulcer, unspecified as acute or chronic, without hemorrhage or perforation: Secondary | ICD-10-CM | POA: Diagnosis not present

## 2016-05-30 DIAGNOSIS — K5730 Diverticulosis of large intestine without perforation or abscess without bleeding: Secondary | ICD-10-CM | POA: Insufficient documentation

## 2016-05-30 MED ORDER — SPIRONOLACTONE 50 MG PO TABS: 50.0000 mg | ORAL_TABLET | Freq: Every day | ORAL | 5 refills | Status: DC

## 2016-05-30 MED ORDER — ISOSORBIDE MONONITRATE ER 120 MG PO TB24: 120.0000 mg | ORAL_TABLET | Freq: Every day | ORAL | 5 refills | Status: DC

## 2016-05-30 MED FILL — CARVEDILOL 25 MG TABLET: 25 | 30 days supply | Qty: 60 | Fill #1

## 2016-05-30 MED FILL — DIGITEK 125 MCG TABLET: 125 | 30 days supply | Qty: 30 | Fill #1

## 2016-05-30 MED FILL — ISOSORBIDE MN ER 60 MG TAB: 60 | 30 days supply | Qty: 45 | Fill #2

## 2016-05-30 MED FILL — ?OMEPRAZOLE DR 20 MG CAPSUL: 20 | 30 days supply | Qty: 30 | Fill #1

## 2016-05-30 MED FILL — ?FUROSEMIDE 40 MG TABLET: 40 | 30 days supply | Qty: 60 | Fill #1

## 2016-05-30 MED FILL — ?SPIRONOLACTONE 25 MG TABLE: 25 MG | 30 days supply | Qty: 30 | Fill #1

## 2016-05-30 NOTE — Patient Instructions (Signed)
Nice to see you today!  Increase spironolactone to 50mg  daily and Imdur to 120mg  daily. We sent two new prescriptions.  Otherwise, continue to take all of your other medications the same.  Follow up appointment scheduled on 06/13/16 at 1:15PM.

## 2016-05-30 NOTE — Progress Notes (Signed)
HPI:  51 yo AA male with history of nonischemic cardiomyopathy. He was initially diagnosed with CHF in 2010.  He was admitted then and treated for CHF exacerbation, EF was low.  However, on repeat echo in 2010, EF was back to normal and he was lost to followup.  He was admitted in 12/16 with acute systolic CHF.  Dyspnea had gradually built up and he had anasarca.  Echo showed EF 10-15% with diffuse hypokinesis, moderate RV dysfunction, and moderate to severe MR.  Cardiac output was low and he was started on milrinone gtt and IV Lasix.  He was diuresed extensively and had coronary angiography showing no CAD. He was weaned off milrinone and sent home.  During this stay, he was also noted to have iron deficiency anemia and FOBT+.  Colonscopy showed diverticulosis and EGD showed a nonbleeding duodenal ulcer. He had hypoalbuminemia, elevated INR, and ascites but imaging of the liver was not suggestive of cirrhosis. Cardiac MRI done to followup in 4/17 showed EF improved to 40% with no late gadolinium enhancement.   He presents today for pharmacist-led HF medication titration. At his last HF clinic visit on 04/18/16, his hydralazine was increased to 100 mg TID and carvedilol was increased to 25 mg BID. He is taking all his medications excepts skips lasix sometimes if he is going to be traveling.  BP still running high.  Weight is up about 5 lbs - patient attributes this to the holidays.  Only rare lightheadedness.  No dyspnea walking on flat ground. Climbs stairs a lot at work, no problems. Cycling at home and also back at the gym - usually about 1-2 hours 3 times a week.  . Shortness of breath/dyspnea on exertion? Yes - patient thinks this is related to a cold he has had over the past week . Orthopnea/PND? No  . Edema? No  . Lightheadedness/dizziness? Yes - lightheaded when he first wakes up and gets moving around . Daily weights at home? No  . Blood pressure/heart rate monitoring at home? No - but does have  blood pressure machine at home . Following low-sodium/fluid-restricted diet? Yes  HF Medications: Digoxin 142mcg daily  Coreg 25 mg bid Spironolactone 25mg  daily  Hydralazine 100 mg tid Imdur 90mg  daily  Has the patient been experiencing any side effects to the medications prescribed?  no  Does the patient have any problems obtaining medications due to transportation or finances?   no  Understanding of regimen: good Understanding of indications: good Potential of compliance: good Patient understands to avoid NSAIDs. Patient understands to avoid decongestants.    Pertinent Lab Values: . 04/18/16: Serum creatinine 1.11, CO2 25, Potassium 3.9, Sodium 138, Digoxin <0.2   Vital Signs: . Weight: 256.4 (dry weight: 250) . Blood pressure: 146/90 mmHg  . Heart rate: 70 bpm   Assessment: 1. Chronic systolic HFrEF (0000000): Nonischemic cardiomyopathy. EF improved to 40% on cardiac MRI in 4/17. Cause uncertain.NYHA class I-II. Not significantly volume overloaded. BP still up despite recent dose increases and increased exercise. - Continue digoxin 0.125 mg daily, carvedilol 25 mg bid, hydralazine 100 mg TID, furosemide 40 mg daily (take additional 20 mg for weight gain >3 lbs in 1 day or >5 lbs in 1 week) - Increase spironolactone to 50 mg daily and isosorbide mononitrate to 120 mg daily - Holding off on ACEI/ARBARNI for now given angioedema with ACEI, may consider ARB in future per MD - Once approved for Medicaid, can consider switch to Bidil which should help with  compliance - Basic disease state pathophysiology, medication indication, mechanism and side effects reviewed at length with patient and he verbalized understanding 2. Duodenal ulcer/anemia/FOBT+ - Continue omeprazole 3. ?Cirrhosis - Some concern for cirrhosis based on low albumin, high INR, and ascites at last admission, but liver did not appear cirrhotic on CT 4. HTN - BP continues to run high - Increasing Arlyce Harman and  Imdur as above  Plan: 1) Medication changes: Based on clinical presentation, vital signs and recent labs will increase spironolactone to 50 mg daily and isosorbide mononitrate to 120 mg daily. Continue other heart failure medications as prescribed 2) Labs: follow-up BMET in 2 weeks 3) Follow-up: Pharmacy Clinic on 06/13/16 at 1:15PM  Cruz Condon, PharmD, [place] PGY2 Pharmacy Resident  Ruta Hinds. Velva Harman, PharmD, BCPS, CPP Clinical Pharmacist Pager: [phone] Phone: [phone] 05/30/2016 1:39 PM

## 2016-06-13 ENCOUNTER — Inpatient Hospital Stay (HOSPITAL_COMMUNITY): Admission: RE | Admit: 2016-06-13 | Payer: BLUE CROSS/BLUE SHIELD | Source: Ambulatory Visit

## 2016-08-08 MED FILL — DIGITEK 125 MCG TABLET: 125 | 30 days supply | Qty: 30 | Fill #2

## 2016-08-08 MED FILL — ?SPIRONOLACTONE 25 MG TABLE: 25 | 30 days supply | Qty: 30 | Fill #2

## 2016-08-08 MED FILL — hydrALAZINE HCL 100 MG TABS: 100 | 30 days supply | Qty: 90 | Fill #1

## 2016-08-08 MED FILL — CARVEDILOL 25 MG TABLET: 25 | 30 days supply | Qty: 60 | Fill #2

## 2016-08-08 MED FILL — ?OMEPRAZOLE DR 20 MG CAPSUL: 20 | 30 days supply | Qty: 30 | Fill #2

## 2016-09-30 ENCOUNTER — Other Ambulatory Visit (HOSPITAL_COMMUNITY): Payer: Self-pay | Admitting: Cardiology

## 2016-09-30 DIAGNOSIS — I5023 Acute on chronic systolic (congestive) heart failure: Secondary | ICD-10-CM

## 2016-09-30 MED FILL — ISOSORBIDE MN ER 60 MG TAB: 60 | 30 days supply | Qty: 45 | Fill #3

## 2016-09-30 MED FILL — CARVEDILOL 25 MG TABLET: 25 | 30 days supply | Qty: 60 | Fill #3

## 2016-09-30 MED FILL — SPIRONOLACTONE 25 MG TABLET: 25 | 30 days supply | Qty: 30 | Fill #3

## 2016-09-30 MED FILL — ?DIGITEK 125 MCG TABLET: 125 | 30 days supply | Qty: 30 | Fill #3

## 2016-09-30 MED FILL — OMEPRAZOLE DR 20 MG CAPSULE: 20 | 30 days supply | Qty: 30 | Fill #3

## 2016-09-30 MED FILL — hydrALAZINE HCL 100 MG TABS: 100 | 30 days supply | Qty: 90 | Fill #2

## 2017-03-14 ENCOUNTER — Telehealth (HOSPITAL_COMMUNITY): Payer: Self-pay

## 2017-03-14 NOTE — Telephone Encounter (Signed)
LVMTCB to schedule next fu apt with CHF clinic on APP side

## 2017-06-05 ENCOUNTER — Telehealth (HOSPITAL_COMMUNITY): Payer: Self-pay | Admitting: Cardiology

## 2017-06-05 NOTE — Telephone Encounter (Signed)
Called patient and LVM for him to call the office.  Per staff message from Kevan Rosebush, RN pt is due for recall appt with Dr. Aundra Dubin or APP.

## 2017-06-09 ENCOUNTER — Encounter (HOSPITAL_COMMUNITY): Payer: Self-pay | Admitting: Cardiology

## 2018-03-10 ENCOUNTER — Encounter (HOSPITAL_COMMUNITY): Payer: Self-pay

## 2018-03-10 ENCOUNTER — Inpatient Hospital Stay (HOSPITAL_COMMUNITY)
Admission: EM | Admit: 2018-03-10 | Discharge: 2018-03-20 | DRG: 291 | Disposition: A | Discharge status: Home / Self Care | Payer: Self-pay | Attending: Internal Medicine | Admitting: Internal Medicine

## 2018-03-10 ENCOUNTER — Other Ambulatory Visit: Payer: Self-pay

## 2018-03-10 ENCOUNTER — Inpatient Hospital Stay (HOSPITAL_COMMUNITY): Payer: Self-pay

## 2018-03-10 ENCOUNTER — Emergency Department (HOSPITAL_COMMUNITY): Payer: Self-pay

## 2018-03-10 DIAGNOSIS — I5082 Biventricular heart failure: Secondary | ICD-10-CM | POA: Diagnosis present

## 2018-03-10 DIAGNOSIS — E871 Hypo-osmolality and hyponatremia: Secondary | ICD-10-CM | POA: Diagnosis present

## 2018-03-10 DIAGNOSIS — I959 Hypotension, unspecified: Secondary | ICD-10-CM | POA: Diagnosis present

## 2018-03-10 DIAGNOSIS — Z888 Allergy status to other drugs, medicaments and biological substances status: Secondary | ICD-10-CM

## 2018-03-10 DIAGNOSIS — D649 Anemia, unspecified: Secondary | ICD-10-CM

## 2018-03-10 DIAGNOSIS — Z9114 Patient's other noncompliance with medication regimen: Secondary | ICD-10-CM

## 2018-03-10 DIAGNOSIS — I428 Other cardiomyopathies: Secondary | ICD-10-CM | POA: Diagnosis present

## 2018-03-10 DIAGNOSIS — K7460 Unspecified cirrhosis of liver: Secondary | ICD-10-CM | POA: Diagnosis present

## 2018-03-10 DIAGNOSIS — I2729 Other secondary pulmonary hypertension: Secondary | ICD-10-CM | POA: Diagnosis present

## 2018-03-10 DIAGNOSIS — I509 Heart failure, unspecified: Secondary | ICD-10-CM

## 2018-03-10 DIAGNOSIS — R17 Unspecified jaundice: Secondary | ICD-10-CM

## 2018-03-10 DIAGNOSIS — J9601 Acute respiratory failure with hypoxia: Secondary | ICD-10-CM

## 2018-03-10 DIAGNOSIS — I5023 Acute on chronic systolic (congestive) heart failure: Secondary | ICD-10-CM

## 2018-03-10 DIAGNOSIS — I110 Hypertensive heart disease with heart failure: Principal | ICD-10-CM | POA: Diagnosis present

## 2018-03-10 DIAGNOSIS — I10 Essential (primary) hypertension: Secondary | ICD-10-CM

## 2018-03-10 DIAGNOSIS — Z23 Encounter for immunization: Secondary | ICD-10-CM

## 2018-03-10 DIAGNOSIS — R109 Unspecified abdominal pain: Secondary | ICD-10-CM

## 2018-03-10 DIAGNOSIS — Z801 Family history of malignant neoplasm of trachea, bronchus and lung: Secondary | ICD-10-CM

## 2018-03-10 DIAGNOSIS — R079 Chest pain, unspecified: Secondary | ICD-10-CM

## 2018-03-10 DIAGNOSIS — Z9111 Patient's noncompliance with dietary regimen: Secondary | ICD-10-CM

## 2018-03-10 DIAGNOSIS — Z79899 Other long term (current) drug therapy: Secondary | ICD-10-CM

## 2018-03-10 DIAGNOSIS — E538 Deficiency of other specified B group vitamins: Secondary | ICD-10-CM | POA: Diagnosis present

## 2018-03-10 DIAGNOSIS — K5900 Constipation, unspecified: Secondary | ICD-10-CM | POA: Diagnosis present

## 2018-03-10 DIAGNOSIS — R188 Other ascites: Secondary | ICD-10-CM | POA: Diagnosis present

## 2018-03-10 DIAGNOSIS — E8809 Other disorders of plasma-protein metabolism, not elsewhere classified: Secondary | ICD-10-CM | POA: Diagnosis present

## 2018-03-10 DIAGNOSIS — K828 Other specified diseases of gallbladder: Secondary | ICD-10-CM | POA: Diagnosis present

## 2018-03-10 DIAGNOSIS — L8990 Pressure ulcer of unspecified site, unspecified stage: Secondary | ICD-10-CM

## 2018-03-10 DIAGNOSIS — G4733 Obstructive sleep apnea (adult) (pediatric): Secondary | ICD-10-CM | POA: Diagnosis present

## 2018-03-10 DIAGNOSIS — I081 Rheumatic disorders of both mitral and tricuspid valves: Secondary | ICD-10-CM | POA: Diagnosis present

## 2018-03-10 DIAGNOSIS — I5043 Acute on chronic combined systolic (congestive) and diastolic (congestive) heart failure: Secondary | ICD-10-CM | POA: Diagnosis present

## 2018-03-10 DIAGNOSIS — Z880 Allergy status to penicillin: Secondary | ICD-10-CM

## 2018-03-10 LAB — COMPREHENSIVE METABOLIC PANEL
ALT: 36 U/L (ref 0–44)
ANION GAP: 10 (ref 5–15)
AST: 29 U/L (ref 15–41)
Albumin: 2 g/dL — ABNORMAL LOW (ref 3.5–5.0)
Alkaline Phosphatase: 99 U/L (ref 38–126)
BUN: 13 mg/dL (ref 6–20)
CHLORIDE: 110 mmol/L (ref 98–111)
CO2: 20 mmol/L — ABNORMAL LOW (ref 22–32)
Calcium: 7.7 mg/dL — ABNORMAL LOW (ref 8.9–10.3)
Creatinine, Ser: 1.06 mg/dL (ref 0.61–1.24)
GFR calc non Af Amer: >60 mL/min (ref >60)
Glucose, Bld: 112 mg/dL — ABNORMAL HIGH (ref 70–99)
POTASSIUM: 3.6 mmol/L (ref 3.5–5.1)
Sodium: 140 mmol/L (ref 135–145)
Total Bilirubin: 4.9 mg/dL — ABNORMAL HIGH (ref 0.3–1.2)
Total Protein: 6 g/dL — ABNORMAL LOW (ref 6.5–8.1)

## 2018-03-10 LAB — I-STAT ARTERIAL BLOOD GAS, ED
Acid-base deficit: 3 mmol/L — ABNORMAL HIGH (ref 0.0–2.0)
Bicarbonate: 18.8 mmol/L — ABNORMAL LOW (ref 20.0–28.0)
O2 SAT: 100 %
PH ART: 7.468 — AB (ref 7.350–7.450)
TCO2: 20 mmol/L — ABNORMAL LOW (ref 22–32)
pCO2 arterial: 26 mmHg — ABNORMAL LOW (ref 32.0–48.0)
pO2, Arterial: 380 mmHg — ABNORMAL HIGH (ref 83.0–108.0)

## 2018-03-10 LAB — CBC WITH DIFFERENTIAL/PLATELET
Abs Immature Granulocytes: 0.02 10*3/uL (ref 0.00–0.07)
BASOS PCT: 0 %
Basophils Absolute: 0 10*3/uL (ref 0.0–0.1)
EOS PCT: 0 %
Eosinophils Absolute: 0 10*3/uL (ref 0.0–0.5)
HCT: 37.9 % — ABNORMAL LOW (ref 39.0–52.0)
Hemoglobin: 12.3 g/dL — ABNORMAL LOW (ref 13.0–17.0)
Immature Granulocytes: 0 %
Lymphocytes Relative: 10 %
Lymphs Abs: 0.5 10*3/uL — ABNORMAL LOW (ref 0.7–4.0)
MCH: 30.3 pg (ref 26.0–34.0)
MCHC: 32.5 g/dL (ref 30.0–36.0)
MCV: 93.3 fL (ref 80.0–100.0)
MONO ABS: 0.3 10*3/uL (ref 0.1–1.0)
Monocytes Relative: 7 %
Neutro Abs: 3.8 10*3/uL (ref 1.7–7.7)
Neutrophils Relative %: 83 %
PLATELETS: 197 10*3/uL (ref 150–400)
RBC: 4.06 MIL/uL — AB (ref 4.22–5.81)
RDW: 18.9 % — ABNORMAL HIGH (ref 11.5–15.5)
WBC: 4.7 10*3/uL (ref 4.0–10.5)
nRBC: 0 % (ref 0.0–0.2)

## 2018-03-10 LAB — BRAIN NATRIURETIC PEPTIDE: B NATRIURETIC PEPTIDE 5: 2304.5 pg/mL — AB (ref 0.0–100.0)

## 2018-03-10 LAB — I-STAT CHEM 8, ED
BUN: 14 mg/dL (ref 6–20)
CREATININE: 0.7 mg/dL (ref 0.61–1.24)
Calcium, Ion: 0.95 mmol/L — ABNORMAL LOW (ref 1.15–1.40)
Chloride: 110 mmol/L (ref 98–111)
Glucose, Bld: 101 mg/dL — ABNORMAL HIGH (ref 70–99)
HEMATOCRIT: 37 % — AB (ref 39.0–52.0)
HEMOGLOBIN: 12.6 g/dL — AB (ref 13.0–17.0)
POTASSIUM: 3.3 mmol/L — AB (ref 3.5–5.1)
Sodium: 145 mmol/L (ref 135–145)
TCO2: 22 mmol/L (ref 22–32)

## 2018-03-10 LAB — I-STAT TROPONIN, ED: Troponin i, poc: 0 ng/mL (ref 0.00–0.08)

## 2018-03-10 MED ORDER — FUROSEMIDE 10 MG/ML IJ SOLN
40.0000 mg | Freq: Two times a day (BID) | INTRAMUSCULAR | Status: DC
  Administered 2018-03-11: 40 mg via INTRAVENOUS
  Filled 2018-03-10: qty 4

## 2018-03-10 MED ORDER — FUROSEMIDE 10 MG/ML IJ SOLN
40.0000 mg | Freq: Once | INTRAMUSCULAR | Status: AC
  Administered 2018-03-10: 40 mg via INTRAVENOUS
  Filled 2018-03-10: qty 4

## 2018-03-10 MED ORDER — ENOXAPARIN SODIUM 40 MG/0.4ML ~~LOC~~ SOLN
40.0000 mg | Freq: Every day | SUBCUTANEOUS | Status: DC
  Administered 2018-03-11 – 2018-03-20 (×10): 40 mg via SUBCUTANEOUS
  Filled 2018-03-10 (×11): qty 0.4

## 2018-03-10 MED ORDER — POTASSIUM CHLORIDE CRYS ER 20 MEQ PO TBCR
40.0000 meq | EXTENDED_RELEASE_TABLET | Freq: Once | ORAL | Status: AC
  Administered 2018-03-10: 40 meq via ORAL
  Filled 2018-03-10: qty 2

## 2018-03-10 NOTE — ED Provider Notes (Signed)
[facility] EMERGENCY DEPARTMENT Provider Note   CSN: [id number] Arrival date & time: 03/10/18  2010     History   Chief Complaint Chief Complaint  Patient presents with  . Shortness of Breath    HPI Kyle Conrad is a 52 y.o. male history of heart failure but noncompliant with meds, hypertension, here presenting shortness of breath.  Patient has been short of breath for the last several weeks.  Shortness of breath is progressively gotten worse.  Noticed increasing swelling both the legs and arms as well as in the scrotum area.  Patient states that he was at target today and came out of the store and had sudden onset of worsening shortness of breath.  He states that he laid on the ball outside of target and someone called EMS.  EMS noticed that he was tachypneic and had rales up to the mid lung fields and talking in 2-3 word sentences so placed patient on CPAP. Patient hasn't seen a doctor for the last 2-3 years and isn't taking any medicines.   The history is provided by the patient.    Past Medical History:  Diagnosis Date  . CHF (congestive heart failure) ([place])   . Hypertension     Patient Active Problem List   Diagnosis Date Noted  . Essential hypertension 08/10/2015  . Hypoalbuminemia 05/19/2015  . Anasarca   . Acute congestive heart failure ([place]) 04/22/2015  . Anemia 04/22/2015  . Obstructive sleep apnea 12/15/2008  . FATIGUE / MALAISE 10/23/2008  . DYSPNEA 10/23/2008  . SYSTOLIC HEART FAILURE, CHRONIC 09/25/2008  . Overweight 09/18/2008  . VENTRICULAR TACHYCARDIA 09/18/2008    Past Surgical History:  Procedure Laterality Date  . CARDIAC CATHETERIZATION N/A 04/30/2015   Procedure: Right/Left Heart Cath and Coronary Angiography;  Surgeon: Larey Dresser, MD;  Location: [facility] LAB;  Service: Cardiovascular;  Laterality: N/A;  . COLONOSCOPY WITH PROPOFOL N/A 05/01/2015   Procedure: COLONOSCOPY WITH PROPOFOL;  Surgeon: Wilford Corner,  MD;  Location: [facility] ENDOSCOPY;  Service: Endoscopy;  Laterality: N/A;  . ESOPHAGOGASTRODUODENOSCOPY (EGD) WITH PROPOFOL N/A 05/01/2015   Procedure: ESOPHAGOGASTRODUODENOSCOPY (EGD) WITH PROPOFOL;  Surgeon: Wilford Corner, MD;  Location: [facility] ENDOSCOPY;  Service: Endoscopy;  Laterality: N/A;  . SHOULDER SURGERY          Home Medications    Prior to Admission medications   Medication Sig Start Date End Date Taking? Authorizing Provider  OVER THE COUNTER MEDICATION Take 1 Bottle by mouth daily as needed (when cycling).   Yes [provider]  carvedilol (COREG) 25 MG tablet Take 1 tablet (25 mg total) by mouth 2 (two) times daily with a meal. Needs office visit Patient not taking: Reported on 03/10/2018 10/04/16   Larey Dresser, MD  digoxin (DIGITEK) 0.125 MG tablet Take 1 tablet (125 mcg total) by mouth daily. Needs office visit Patient not taking: Reported on 03/10/2018 10/04/16   Larey Dresser, MD  furosemide (LASIX) 40 MG tablet Take 1 tablet (40 mg total) by mouth daily. Take an additional 20 mg (1/2 tablet) for 3 lb weight gain in 1 day or 5 lb weight gain in 1 week Patient not taking: Reported on 03/10/2018 02/16/16   Larey Dresser, MD  hydrALAZINE (APRESOLINE) 100 MG tablet Take 1 tablet (100 mg total) by mouth 3 (three) times daily. Patient not taking: Reported on 03/10/2018 04/18/16   Larey Dresser, MD  isosorbide mononitrate (IMDUR) 120 MG 24 hr tablet Take 1 tablet (120  mg total) by mouth daily. Patient not taking: Reported on 03/10/2018 05/30/16   Larey Dresser, MD  omeprazole (PRILOSEC) 20 MG capsule TAKE 1 CAPSULE BY MOUTH DAILY Patient not taking: Reported on 03/10/2018 04/15/16   Larey Dresser, MD  spironolactone (ALDACTONE) 25 MG tablet Take 1 tablet (25 mg total) by mouth daily. Needs office visit Patient not taking: Reported on 03/10/2018 10/04/16   Larey Dresser, MD  spironolactone (ALDACTONE) 50 MG tablet Take 1 tablet (50 mg total) by mouth  daily. Patient not taking: Reported on 03/10/2018 05/30/16   Larey Dresser, MD    Family History Family History  Problem Relation Age of Onset  . Lung cancer Mother     Social History Social History   Tobacco Use  . Smoking status: Never Smoker  . Smokeless tobacco: Never Used  Substance Use Topics  . Alcohol use: No  . Drug use: No     Allergies   Lisinopril and Penicillins   Review of Systems Review of Systems  Respiratory: Positive for shortness of breath.   Genitourinary: Positive for penile swelling and scrotal swelling.  All other systems reviewed and are negative.    Physical Exam Updated Vital Signs BP (!) 114/99   Resp 14   Ht 6' (1.829 m)   Wt 124.7 kg   SpO2 94%   BMI 37.30 kg/m   Physical Exam  Constitutional:  Chronically ill, tachypneic   HENT:  Head: Normocephalic.  Mouth/Throat: Oropharynx is clear and moist.  Eyes: Pupils are equal, round, and reactive to light. EOM are normal.  Neck: Normal range of motion.  Cardiovascular: Normal rate.  Pulmonary/Chest:  Tachypneic, moderate distress, crackles bilateral bases   Abdominal: Soft. Bowel sounds are normal.  There is swelling and edema around his penis, whitish area at the base of the penis. Scrotal edema, no obvious cellulitis in the scrotal area   Musculoskeletal:       Right lower leg: He exhibits edema.       Left lower leg: He exhibits edema.  2+ edema bilateral arms and legs   Neurological: He is alert.  Skin: Skin is warm. Capillary refill takes less than 2 seconds.  Psychiatric: He has a normal mood and affect. His behavior is normal.  Nursing note and vitals reviewed.    ED Treatments / Results  Labs (all labs ordered are listed, but only abnormal results are displayed) Labs Reviewed  CBC WITH DIFFERENTIAL/PLATELET - Abnormal; Notable for the following components:      Result Value   RBC 4.06 (*)    Hemoglobin 12.3 (*)    HCT 37.9 (*)    RDW 18.9 (*)    Lymphs Abs  0.5 (*)    All other components within normal limits  COMPREHENSIVE METABOLIC PANEL - Abnormal; Notable for the following components:   CO2 20 (*)    Glucose, Bld 112 (*)    Calcium 7.7 (*)    Total Protein 6.0 (*)    Albumin 2.0 (*)    Total Bilirubin 4.9 (*)    All other components within normal limits  BRAIN NATRIURETIC PEPTIDE - Abnormal; Notable for the following components:   B Natriuretic Peptide 2,304.5 (*)    All other components within normal limits  I-STAT CHEM 8, ED - Abnormal; Notable for the following components:   Potassium 3.3 (*)    Glucose, Bld 101 (*)    Calcium, Ion 0.95 (*)    Hemoglobin 12.6 (*)  HCT 37.0 (*)    All other components within normal limits  I-STAT ARTERIAL BLOOD GAS, ED - Abnormal; Notable for the following components:   pH, Arterial 7.468 (*)    pCO2 arterial 26.0 (*)    pO2, Arterial 380.0 (*)    Bicarbonate 18.8 (*)    TCO2 20 (*)    Acid-base deficit 3.0 (*)    All other components within normal limits  BLOOD GAS, ARTERIAL  URINALYSIS, ROUTINE W REFLEX MICROSCOPIC  I-STAT TROPONIN, ED    EKG EKG Interpretation  Date/Time:  Saturday March 10 2018 20:21:18 EDT Ventricular Rate:  102 PR Interval:    QRS Duration: 94 QT Interval:  380 QTC Calculation: 495 R Axis:   73 Text Interpretation:  Sinus tachycardia Low voltage with right axis deviation Consider anterior infarct Nonspecific T abnormalities, lateral leads No significant change since last tracing Confirmed by Wandra Arthurs ([id number]) on 03/10/2018 8:25:45 PM   Radiology Dg Chest Port 1 View  Result Date: 03/10/2018 CLINICAL DATA:  Shortness of breath EXAM: PORTABLE CHEST 1 VIEW COMPARISON:  Chest radiograph April 22, 2015 and chest CT April 23, 2015 FINDINGS: There is patchy opacity in the right base region. Lungs elsewhere are clear. There is cardiomegaly with pulmonary vascularity normal. No adenopathy. There is degenerative change in each shoulder. IMPRESSION: Patchy  opacity right base concerning for pneumonia. Lungs elsewhere clear. There is cardiomegaly. No adenopathy evident. Electronically Signed   By: Lowella Grip III M.D.   On: 03/10/2018 21:54    Procedures Procedures (including critical care time)  CRITICAL CARE Performed by: Wandra Arthurs   Total critical care time: 30 minutes  Critical care time was exclusive of separately billable procedures and treating other patients.  Critical care was necessary to treat or prevent imminent or life-threatening deterioration.  Critical care was time spent personally by me on the following activities: development of treatment plan with patient and/or surrogate as well as nursing, discussions with consultants, evaluation of patient's response to treatment, examination of patient, obtaining history from patient or surrogate, ordering and performing treatments and interventions, ordering and review of laboratory studies, ordering and review of radiographic studies, pulse oximetry and re-evaluation of patient's condition.   Medications Ordered in ED Medications  furosemide (LASIX) injection 40 mg (40 mg Intravenous Given 03/10/18 2126)     Initial Impression / Assessment and Plan / ED Course  I have reviewed the triage vital signs and the nursing notes.  Pertinent labs & imaging results that were available during my care of the patient were reviewed by me and considered in my medical decision making (see chart for details).    Ulrick Methot is a 52 y.o. male here with SOB, leg swelling. Likely CHF exacerbation. Will get labs, BNP, CXR. Will get ABG as well. Has scrotal edema and penile swelling but no obvious signs of cellulitis or fournier's gangrene, I think likely just dependent edema.   11:19 PM ABG reassuring, patient on 5 L [place] now. CXR showed possible pneumonia but I think likely just pulmonary edema. BNP 2300. Given lasix IV. Will admit for CHF exacerbation with respiratory distress.     Final Clinical Impressions(s) / ED Diagnoses   Final diagnoses:  None    ED Discharge Orders    None       Drenda Freeze, MD 03/10/18 2320

## 2018-03-10 NOTE — ED Triage Notes (Signed)
Pt BIB [facility] for eval of SOB. Pt reports that he has been SOB x 2 weeks, gradually worsening and weakness for last 1-2 days. Non compliant w/ medications x 2 years. Pt arrives on CPAP by EMS, soiled in stool and urine. Pt has diffuse +4 edema to hands and feet, states has been like this for weeks.

## 2018-03-10 NOTE — H&P (Signed)
History and Physical    Kyle Conrad GEX:[id number] DOB: 07-Oct-1965 DOA: 03/10/2018  PCP: Dorena Dew, FNP Patient coming from: Home  Chief Complaint: Shortness of breath, edema  HPI: Kyle Conrad is a 52 y.o. male with medical history  significant of hypertension, chronic combined systolic and diastolic CHF being brought into the hospital via EMS for evaluation of shortness of breath and edema.  Patient states he has not seen a doctor in the past 3 years and has not taken any medications since he does not have insurance.  States he was walking in the parking lot at [employer] today and felt very short of breath.  He had to sit down to rest and called EMS.  Reports having shortness of breath for the past 2 months and fatigue for the past 1 week.  Denies having orthopnea.  States he started noticing both of his hands and legs have been swollen recently.  He has been having a dry cough.  His diet includes a lot of salt and he likes to drink a lot of water.  States his weight at baseline is around 240 pounds. Denies having any fevers or chills.  Reports having substernal 4 out of 10 intensity pressure since after receiving Lasix in the emergency room.  Denies having any chest pain previously.  ED Course: Patient was brought to the ED on CPAP by EMS.  He was initially placed on nonrebreather.  ABG showing pH 7.46, PCO2 26, and P O2 380.  Patient subsequently transitioned to 6 L oxygen via nasal cannula.  Not tachycardic or tachypneic.  Blood pressure 102/86 on arrival.  No leukocytosis.  Bicarb 20. T bili 4.9.  Remainder of LFTs normal.  BNP 2304.  I-STAT troponin negative.  EKG not suggestive of ACS. UA pending.  Chest x-ray showing patchy opacity at the right lung base concerning for pneumonia and evidence of cardiomegaly.  Patient received IV Lasix 40 mg once in the ED.  TR[facility] paged to admit.  Review of Systems: As per HPI otherwise 10 point review of systems negative.  Past Medical History:    Diagnosis Date  . CHF (congestive heart failure) ([place])   . Hypertension     Past Surgical History:  Procedure Laterality Date  . CARDIAC CATHETERIZATION N/A 04/30/2015   Procedure: Right/Left Heart Cath and Coronary Angiography;  Surgeon: Larey Dresser, MD;  Location: [facility];  Service: Cardiovascular;  Laterality: N/A;  . COLONOSCOPY WITH PROPOFOL N/A 05/01/2015   Procedure: COLONOSCOPY WITH PROPOFOL;  Surgeon: Wilford Corner, MD;  Location: [facility] ENDOSCOPY;  Service: Endoscopy;  Laterality: N/A;  . ESOPHAGOGASTRODUODENOSCOPY (EGD) WITH PROPOFOL N/A 05/01/2015   Procedure: ESOPHAGOGASTRODUODENOSCOPY (EGD) WITH PROPOFOL;  Surgeon: Wilford Corner, MD;  Location: [facility] ENDOSCOPY;  Service: Endoscopy;  Laterality: N/A;  . SHOULDER SURGERY       reports that he has never smoked. He has never used smokeless tobacco. He reports that he does not drink alcohol or use drugs.  Allergies  Allergen Reactions  . Lisinopril Swelling    REACTION: pt had a swelling episode.  His lips swelled  . Penicillins Hives    Has patient had a PCN reaction causing immediate rash, facial/tongue/throat swelling, SOB or lightheadedness with hypotension: Yes Has patient had a PCN reaction causing severe rash involving mucus membranes or skin necrosis: No Has patient had a PCN reaction that required hospitalization: No Has patient had a PCN reaction occurring within the last 10 years: No If all of the above  answers are "NO", then may proceed with Cephalosporin use.    Family History  Problem Relation Age of Onset  . Lung cancer Mother     Prior to Admission medications   Medication Sig Start Date End Date Taking? Authorizing Provider  OVER THE COUNTER MEDICATION Take 1 Bottle by mouth daily as needed (when cycling).   Yes [provider]  carvedilol (COREG) 25 MG tablet Take 1 tablet (25 mg total) by mouth 2 (two) times daily with a meal. Needs office visit Patient not taking: Reported  on 03/10/2018 10/04/16   Larey Dresser, MD  digoxin (DIGITEK) 0.125 MG tablet Take 1 tablet (125 mcg total) by mouth daily. Needs office visit Patient not taking: Reported on 03/10/2018 10/04/16   Larey Dresser, MD  furosemide (LASIX) 40 MG tablet Take 1 tablet (40 mg total) by mouth daily. Take an additional 20 mg (1/2 tablet) for 3 lb weight gain in 1 day or 5 lb weight gain in 1 week Patient not taking: Reported on 03/10/2018 02/16/16   Larey Dresser, MD  hydrALAZINE (APRESOLINE) 100 MG tablet Take 1 tablet (100 mg total) by mouth 3 (three) times daily. Patient not taking: Reported on 03/10/2018 04/18/16   Larey Dresser, MD  isosorbide mononitrate (IMDUR) 120 MG 24 hr tablet Take 1 tablet (120 mg total) by mouth daily. Patient not taking: Reported on 03/10/2018 05/30/16   Larey Dresser, MD  omeprazole (PRILOSEC) 20 MG capsule TAKE 1 CAPSULE BY MOUTH DAILY Patient not taking: Reported on 03/10/2018 04/15/16   Larey Dresser, MD  spironolactone (ALDACTONE) 25 MG tablet Take 1 tablet (25 mg total) by mouth daily. Needs office visit Patient not taking: Reported on 03/10/2018 10/04/16   Larey Dresser, MD  spironolactone (ALDACTONE) 50 MG tablet Take 1 tablet (50 mg total) by mouth daily. Patient not taking: Reported on 03/10/2018 05/30/16   Larey Dresser, MD    Physical Exam: Vitals:   03/10/18 2115 03/10/18 2130 03/10/18 2215 03/10/18 2242  BP: 99/89 (!) 109/95 (!) 114/99 113/85  Pulse:    95  Resp: 15 14 14  (!) 100  SpO2:    100%  Weight:      Height:       Physical Exam  Constitutional: He is oriented to person, place, and time. He appears well-developed and well-nourished.  HENT:  Head: Normocephalic and atraumatic.  Mouth/Throat: Oropharynx is clear and moist.  Eyes: Right eye exhibits no discharge. Left eye exhibits no discharge.  Scleral icterus  Neck: Neck supple. No tracheal deviation present.  Cardiovascular: Normal rate, regular rhythm and intact distal  pulses.  Pulmonary/Chest: He has no wheezes.  Increased work of breathing.  On 5 L supplemental oxygen via nasal cannula. Bibasilar rales appreciated on auscultation.  Abdominal: Bowel sounds are normal. He exhibits distension. There is no tenderness. There is no guarding.  Musculoskeletal: He exhibits edema.  Noted to have diffuse anasarca (hands, forearms, abdomen, and bilateral lower extremities) with +3 pitting edema.  Neurological: He is alert and oriented to person, place, and time.  Skin: Skin is warm and dry. He is not diaphoretic.  Psychiatric: His behavior is normal.     Labs on Admission: I have personally reviewed following labs and imaging studies  CBC: Recent Labs  Lab 03/10/18 2025 03/10/18 2038  WBC 4.7  --   NEUTROABS 3.8  --   HGB 12.3* 12.6*  HCT 37.9* 37.0*  MCV 93.3  --   PLT  197  --    Basic Metabolic Panel: Recent Labs  Lab 03/10/18 2025 03/10/18 2038  NA 140 145  K 3.6 3.3*  CL 110 110  CO2 20*  --   GLUCOSE 112* 101*  BUN 13 14  CREATININE 1.06 0.70  CALCIUM 7.7*  --    GFR: Estimated Creatinine Clearance: 143.9 mL/min (by C-G formula based on SCr of 0.7 mg/dL). Liver Function Tests: Recent Labs  Lab 03/10/18 2025  AST 29  ALT 36  ALKPHOS 99  BILITOT 4.9*  PROT 6.0*  ALBUMIN 2.0*   No results for input(s): LIPASE, AMYLASE in the last 168 hours. No results for input(s): AMMONIA in the last 168 hours. Coagulation Profile: No results for input(s): INR, PROTIME in the last 168 hours. Cardiac Enzymes: No results for input(s): CKTOTAL, CKMB, CKMBINDEX, TROPONINI in the last 168 hours. BNP (last 3 results) No results for input(s): PROBNP in the last 8760 hours. HbA1C: No results for input(s): HGBA1C in the last 72 hours. CBG: No results for input(s): GLUCAP in the last 168 hours. Lipid Profile: No results for input(s): CHOL, HDL, LDLCALC, TRIG, CHOLHDL, LDLDIRECT in the last 72 hours. Thyroid Function Tests: No results for  input(s): TSH, T4TOTAL, FREET4, T3FREE, THYROIDAB in the last 72 hours. Anemia Panel: No results for input(s): VITAMINB12, FOLATE, FERRITIN, TIBC, IRON, RETICCTPCT in the last 72 hours. Urine analysis:    Component Value Date/Time   COLORURINE AMBER (A) 04/22/2015 1948   APPEARANCEUR CLEAR 04/22/2015 1948   LABSPEC 1.010 09/10/2015 1351   PHURINE 5.5 09/10/2015 1351   GLUCOSEU NEGATIVE 09/10/2015 1351   HGBUR NEGATIVE 09/10/2015 1351   BILIRUBINUR NEGATIVE 09/10/2015 1351   KETONESUR NEGATIVE 09/10/2015 1351   PROTEINUR NEGATIVE 09/10/2015 1351   UROBILINOGEN 0.2 09/10/2015 1351   NITRITE NEGATIVE 09/10/2015 1351   LEUKOCYTESUR NEGATIVE 09/10/2015 1351    Radiological Exams on Admission: Dg Chest Port 1 View  Result Date: 03/10/2018 CLINICAL DATA:  Shortness of breath EXAM: PORTABLE CHEST 1 VIEW COMPARISON:  Chest radiograph April 22, 2015 and chest CT April 23, 2015 FINDINGS: There is patchy opacity in the right base region. Lungs elsewhere are clear. There is cardiomegaly with pulmonary vascularity normal. No adenopathy. There is degenerative change in each shoulder. IMPRESSION: Patchy opacity right base concerning for pneumonia. Lungs elsewhere clear. There is cardiomegaly. No adenopathy evident. Electronically Signed   By: Lowella Grip III M.D.   On: 03/10/2018 21:54    EKG: Independently reviewed.  Sinus tachycardia (heart rate 102) and nonspecific T wave abnormality in the lateral leads; seen on previous EKG as well.  Assessment/Plan Principal Problem:   Acute hypoxemic respiratory failure (HCC) Active Problems:   Chronic anemia   Hypoalbuminemia   HTN (hypertension)   Acute exacerbation of CHF (congestive heart failure) (HCC)   Chest pain   Hyperbilirubinemia  Acute hypoxemic respiratory failure secondary to acute exacerbation of chronic combined systolic and diastolic congestive heart failure -Last echo done in December 2017 showing EF 40 to 45% and grade 1  diastolic dysfunction. -Patient is presenting with fatigue, dyspnea, anasarca, and significant weight gain. Baseline weight 240 pounds, now is 272 pounds.  30+ pound weight gain. -BNP 2304.  X-ray with evidence of cardiomegaly. -Currently on 5 L oxygen via nasal cannula. -Received a dose of IV Lasix 40 mg once in the ED.  Continue IV Lasix 40 mg twice daily.  His renal function is normal (creatinine 0.7). -Hold off starting a beta-blocker in the setting of acutely  decompensated heart failure -Echocardiogram -Low-sodium diet with fluid restriction -Intake and output, daily weights -Consult cardiology in the morning -Continue to monitor renal function with diuresis -Supplemental oxygen as needed  Chest pain I-STAT troponin negative.  EKG not suggestive of ACS. Patient reports having substernal chest pressure after receiving Lasix in the ED. -Repeat stat EKG -Continue to trend troponin -Monitor on telemetry  ? CAP Chest x-ray showing patchy opacity at the right lung base concerning for pneumonia. Patient denies having any fevers or chills.  No leukocytosis on labs.  He is not tachycardic. -Will hold off starting antibiotics at this time.  Check STAT procalcitonin first.   Chronic anemia -Stable.  Hemoglobin 12.3 at baseline.  ?  Hypokalemia Potassium 3.6 on initial CMP but 3.3 on i-STAT chemistry. -Replete orally -Check mag -BMP in a.m.  Hypertension -Currently normotensive. Patient has not taken his home medications in 3 years. -IV diuresis as above -Continue to monitor blood pressure  Hypoalbuminemia Albumin 2.0.  -UA to check for proteinuria; rule out nephrotic syndrome -Right upper quadrant ultrasound to look for signs liver cirrhosis  Hyperbilirubinemia T bili 4.9.  Manger of LFTs normal.  Noted to have scleral icterus on exam. -Right upper quadrant ultrasound -Check PT/INR -Check direct and indirect bilirubin  Lack of insurance -Care management consult  DVT  prophylaxis: Lovenox Code Status: Patient wishes to be full code. Family Communication: No family present at bedside. Disposition Plan: Anticipate discharge to home in 2 to 3 days. Consults called: None Admission status: It is my clinical opinion that admission to INPATIENT is reasonable and necessary in this 52 y.o. male . presenting with symptoms of fatigue, dyspnea, anasarca, and significant weight gain, concerning for Acute hypoxemic respiratory failure secondary to acute exacerbation of chronic combined systolic and diastolic congestive heart failure . with pertinent positives on physical exam including: Supplemental oxygen requirement, anasarca, bibasilar rales . and pertinent positives on radiographic and laboratory data including: Evidence of cardiomegaly, elevated BNP . Workup and treatment include IV diuresis  Given the aforementioned, the predictability of an adverse outcome is felt to be significant. I expect that the patient will require at least 2 midnights in the hospital to treat this condition.    Shela Leff MD [facility] Pager [phone]  If 7PM-7AM, please contact night-coverage [email or web address] Password [email or web address]  03/11/2018, 12:18 AM

## 2018-03-10 NOTE — Progress Notes (Signed)
ABG drawn, per Pa02 results on ABG pt taken off NRB and placed on 6L [place].  Will continue to monitor.

## 2018-03-10 NOTE — ED Notes (Signed)
Portable Xray at bedside.

## 2018-03-11 ENCOUNTER — Inpatient Hospital Stay (HOSPITAL_COMMUNITY): Payer: Self-pay

## 2018-03-11 DIAGNOSIS — I5043 Acute on chronic combined systolic (congestive) and diastolic (congestive) heart failure: Secondary | ICD-10-CM

## 2018-03-11 DIAGNOSIS — I351 Nonrheumatic aortic (valve) insufficiency: Secondary | ICD-10-CM

## 2018-03-11 DIAGNOSIS — J9601 Acute respiratory failure with hypoxia: Secondary | ICD-10-CM

## 2018-03-11 DIAGNOSIS — R079 Chest pain, unspecified: Secondary | ICD-10-CM

## 2018-03-11 LAB — URINALYSIS, ROUTINE W REFLEX MICROSCOPIC
BILIRUBIN URINE: NEGATIVE
GLUCOSE, UA: NEGATIVE mg/dL
KETONES UR: NEGATIVE mg/dL
LEUKOCYTES UA: NEGATIVE
Nitrite: NEGATIVE
PH: 5 (ref 5.0–8.0)
Protein, ur: NEGATIVE mg/dL
SPECIFIC GRAVITY, URINE: 1.008 (ref 1.005–1.030)

## 2018-03-11 LAB — TROPONIN I: Troponin I: 0.04 ng/mL (ref <0.03)

## 2018-03-11 LAB — PROTIME-INR
INR: 2.19
Prothrombin Time: 24.1 seconds — ABNORMAL HIGH (ref 11.4–15.2)

## 2018-03-11 LAB — HIV ANTIBODY (ROUTINE TESTING W REFLEX): HIV Screen 4th Generation wRfx: NONREACTIVE

## 2018-03-11 LAB — MAGNESIUM: Magnesium: 1.5 mg/dL — ABNORMAL LOW (ref 1.7–2.4)

## 2018-03-11 LAB — PROCALCITONIN: Procalcitonin: 0.19 ng/mL

## 2018-03-11 MED ORDER — FUROSEMIDE 10 MG/ML IJ SOLN
80.0000 mg | Freq: Two times a day (BID) | INTRAMUSCULAR | Status: DC
  Administered 2018-03-11 – 2018-03-12 (×3): 80 mg via INTRAVENOUS
  Filled 2018-03-11 (×3): qty 8

## 2018-03-11 MED ORDER — POTASSIUM CHLORIDE CRYS ER 20 MEQ PO TBCR
40.0000 meq | EXTENDED_RELEASE_TABLET | Freq: Once | ORAL | Status: AC
  Administered 2018-03-11: 40 meq via ORAL
  Filled 2018-03-11: qty 2

## 2018-03-11 MED ORDER — MAGNESIUM OXIDE 400 (241.3 MG) MG PO TABS
800.0000 mg | ORAL_TABLET | Freq: Once | ORAL | Status: AC
  Administered 2018-03-11: 800 mg via ORAL
  Filled 2018-03-11: qty 2

## 2018-03-11 NOTE — ED Notes (Signed)
NM medicine informed me HIDA scan would not be able to be done today due to pt eating breakfast at 0841. Test would need to be done today after being NPO for 6 hours.

## 2018-03-11 NOTE — ED Notes (Signed)
Sent message via [facility]rop 0.04 as per lab.

## 2018-03-11 NOTE — Consult Note (Addendum)
Cardiology Consultation:   Patient ID: Kyle Conrad; [id number]; 1966/03/25   Admit date: 03/10/2018 Date of Consult: 03/11/2018  Primary Care Provider: Dorena Dew, FNP Primary Cardiologist: Aundra Dubin   Patient Profile:   Kyle Conrad is a 52 y.o. male with a hx of nonobstructive coronary artery disease by cardiac catheterization in 34/2876, chronic systolic CHF secondary to nonischemic cardiomyopathy, hypoalbuminemia, abnormal liver function testing with elevated INR and ascites, hypertension, iron deficiency anemia with prior positive occult blood testing with colonoscopy in 04/2015 showing diverticulosis and EGD showing small, nonbleeding duodenal ulcer, ACE inhibitor induced angioedema, and sleep apnea not compliant with CPAP who is being seen today for the evaluation of CHF at the request of Dr. Marlowe Sax.  History of Present Illness:   Kyle Conrad was diagnosed with heart failure in 2010.  At that time, he was admitted and treated for CHF exacerbation with EF noted to be low.  Details are unclear.  Repeat echo later on in 2010 that showed improved EF.  He was then lost to follow-up until he was admitted in 04/2015 with acute systolic CHF.  At that time, echo showed an EF of 10 to 50%, diffuse hypokinesis, moderate RV dysfunction, and moderate to severe mitral regurgitation.  Cardiac output was low and he was started on milrinone drip with IV Lasix.  He was diuresed extensively.  Right and left cardiac catheterization at that time showed no significant coronary artery disease with a mean RA 6, PA 42/21 mean 33, mean PCWP 14, CI 5.1 Fick/3.6 thermo, PVR 2.84.  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  SPEP/UPEP negative.  Cardiac MRI in 08/2015 demonstrated an EF of 40%, diffuse hypokinesis, mild LVH, normal RV size and systolic function, no LGE.  Most recent echocardiogram from 04/2016 showed an EF of 40 to 45%, mild MR, mild RV dilation with normal RV  systolic function.  He was last seen by the advanced heart failure clinic in 04/2016 for routine follow-up and reported compliance with medications at that time.  It was felt to be euvolemic with a weight of 113.5 kg.  He has been lost to follow-up since.  Patient indicates he has been off all medications for greater than 1 year and off CPAP for approximately 3 years.  He has had intermittent increased shortness of breath and lower extremity swelling over this time.  However, starting in late August to early September, 2019 the patient began to develop progressive shortness of breath, lower extremity swelling, orthopnea, and weight gain.  However, he is not able to provide specifics regarding his weight gain as he has not been weighing at home.  He does report not monitoring his p.o. fluid or salt intake.  Patient was at [employer] on 10/26 and was walking in from the parking lot.  He became significantly dyspneic with this and had to lay across 1 of the red balls out in front of the store.  With this, he fell to the floor.  He did not suffer LOC.  911 was called and he was brought to the ED for further evaluation.  Upon the patient's arrival to [facility] they were found to have stable vitals, weight 124.7 kg. EKG as below, CXR showed patchy opacity along the right base concerning for possible pneumonia. Labs showed troponin less than 0.03 trending to 0.04, BNP 2304, albumin 2.0, total bilirubin 4.9, AST 29, ALT 36, calcium 7.7, serum creatinine 1.06, potassium 3.6, WBC 4.7, globin 12.3, platelet count 197  magnesium 1.5, PCT 0.19, INR 2.19.  He has been maintained on IV Lasix 40 mg twice daily with patient reported good urine output.  Shortness of breath is about the same.  Past Medical History:  Diagnosis Date  . CHF (congestive heart failure) ([place])   . Hypertension     Past Surgical History:  Procedure Laterality Date  . CARDIAC CATHETERIZATION N/A 04/30/2015   Procedure: Right/Left Heart Cath and Coronary  Angiography;  Surgeon: Larey Dresser, MD;  Location: [place] [facility];  Service: Cardiovascular;  Laterality: N/A;  . COLONOSCOPY WITH PROPOFOL N/A 05/01/2015   Procedure: COLONOSCOPY WITH PROPOFOL;  Surgeon: Wilford Corner, MD;  Location: [facility] ENDOSCOPY;  Service: Endoscopy;  Laterality: N/A;  . ESOPHAGOGASTRODUODENOSCOPY (EGD) WITH PROPOFOL N/A 05/01/2015   Procedure: ESOPHAGOGASTRODUODENOSCOPY (EGD) WITH PROPOFOL;  Surgeon: Wilford Corner, MD;  Location: [facility] ENDOSCOPY;  Service: Endoscopy;  Laterality: N/A;  . SHOULDER SURGERY       Home Meds: Prior to Admission medications   Medication Sig Start Date End Date Taking? Authorizing Provider  OVER THE COUNTER MEDICATION Take 1 Bottle by mouth daily as needed (when cycling).   Yes [provider]  carvedilol (COREG) 25 MG tablet Take 1 tablet (25 mg total) by mouth 2 (two) times daily with a meal. Needs office visit Patient not taking: Reported on 03/10/2018 10/04/16   Larey Dresser, MD  digoxin (DIGITEK) 0.125 MG tablet Take 1 tablet (125 mcg total) by mouth daily. Needs office visit Patient not taking: Reported on 03/10/2018 10/04/16   Larey Dresser, MD  furosemide (LASIX) 40 MG tablet Take 1 tablet (40 mg total) by mouth daily. Take an additional 20 mg (1/2 tablet) for 3 lb weight gain in 1 day or 5 lb weight gain in 1 week Patient not taking: Reported on 03/10/2018 02/16/16   Larey Dresser, MD  hydrALAZINE (APRESOLINE) 100 MG tablet Take 1 tablet (100 mg total) by mouth 3 (three) times daily. Patient not taking: Reported on 03/10/2018 04/18/16   Larey Dresser, MD  isosorbide mononitrate (IMDUR) 120 MG 24 hr tablet Take 1 tablet (120 mg total) by mouth daily. Patient not taking: Reported on 03/10/2018 05/30/16   Larey Dresser, MD  omeprazole (PRILOSEC) 20 MG capsule TAKE 1 CAPSULE BY MOUTH DAILY Patient not taking: Reported on 03/10/2018 04/15/16   Larey Dresser, MD  spironolactone (ALDACTONE) 25 MG tablet Take 1  tablet (25 mg total) by mouth daily. Needs office visit Patient not taking: Reported on 03/10/2018 10/04/16   Larey Dresser, MD  spironolactone (ALDACTONE) 50 MG tablet Take 1 tablet (50 mg total) by mouth daily. Patient not taking: Reported on 03/10/2018 05/30/16   Larey Dresser, MD    Inpatient Medications: Scheduled Meds: . enoxaparin (LOVENOX) injection  40 mg Subcutaneous Daily  . furosemide  40 mg Intravenous BID  . magnesium oxide  800 mg Oral Once  . potassium chloride  40 mEq Oral Once   Continuous Infusions:  PRN Meds:   Allergies:   Allergies  Allergen Reactions  . Lisinopril Swelling    REACTION: pt had a swelling episode.  His lips swelled  . Penicillins Hives    Has patient had a PCN reaction causing immediate rash, facial/tongue/throat swelling, SOB or lightheadedness with hypotension: Yes Has patient had a PCN reaction causing severe rash involving mucus membranes or skin necrosis: No Has patient had a PCN reaction that required hospitalization: No Has patient had a PCN reaction occurring within  the last 10 years: No If all of the above answers are "NO", then may proceed with Cephalosporin use.    Social History:   Social History   Socioeconomic History  . Marital status: Single    Spouse name: Not on file  . Number of children: Not on file  . Years of education: Not on file  . Highest education level: Not on file  Occupational History  . Not on file  Social Needs  . Financial resource strain: Not on file  . Food insecurity:    Worry: Not on file    Inability: Not on file  . Transportation needs:    Medical: Not on file    Non-medical: Not on file  Tobacco Use  . Smoking status: Never Smoker  . Smokeless tobacco: Never Used  Substance and Sexual Activity  . Alcohol use: No  . Drug use: No  . Sexual activity: Not on file  Lifestyle  . Physical activity:    Days per week: Not on file    Minutes per session: Not on file  . Stress: Not on  file  Relationships  . Social connections:    Talks on phone: Not on file    Gets together: Not on file    Attends religious service: Not on file    Active member of club or organization: Not on file    Attends meetings of clubs or organizations: Not on file    Relationship status: Not on file  . Intimate partner violence:    Fear of current or ex partner: Not on file    Emotionally abused: Not on file    Physically abused: Not on file    Forced sexual activity: Not on file  Other Topics Concern  . Not on file  Social History Narrative  . Not on file     Family History:   Family History  Problem Relation Age of Onset  . Lung cancer Mother     ROS:  Review of Systems  Constitutional: Positive for malaise/fatigue. Negative for chills, diaphoresis, fever and weight loss.  HENT: Negative for congestion.   Eyes: Negative for discharge and redness.  Respiratory: Positive for cough and shortness of breath. Negative for hemoptysis, sputum production and wheezing.   Cardiovascular: Positive for orthopnea and leg swelling. Negative for chest pain, palpitations, claudication and PND.  Gastrointestinal: Positive for abdominal pain. Negative for blood in stool, constipation, diarrhea, heartburn, melena, nausea and vomiting.  Genitourinary: Negative for hematuria.  Musculoskeletal: Negative for falls and myalgias.  Skin: Negative for rash.  Neurological: Positive for weakness. Negative for dizziness, tingling, tremors, sensory change, speech change, focal weakness and loss of consciousness.  Endo/Heme/Allergies: Does not bruise/bleed easily.  Psychiatric/Behavioral: Negative for substance abuse. The patient is not nervous/anxious.   All other systems reviewed and are negative.     Physical Exam/Data:   Vitals:   03/11/18 0600 03/11/18 0630 03/11/18 0730 03/11/18 0800  BP: 97/86 104/88 112/89 100/86  Pulse:  83 85   Resp:  12 13 19   SpO2:  100% 100%   Weight:      Height:        No intake or output data in the 24 hours ending 03/11/18 1023 Filed Weights   03/10/18 2023  Weight: 124.7 kg   Body mass index is 37.3 kg/m.   Physical Exam: General: Well developed, well nourished, in no acute distress. Head: Normocephalic, atraumatic, sclera non-icteric, no xanthomas, nares without discharge.  Neck: Negative  for carotid bruits. JVD elevated to the angle of the mandible. Lungs: Diminished breath sounds bilaterally with crackles along the bilateral bases. Heart: RRR with S1 S2. No murmurs, rubs, or gallops appreciated. Abdomen: Soft, non-tender, distended with normoactive bowel sounds. No hepatomegaly. No rebound/guarding. No obvious abdominal masses. Msk:  Strength and tone appear normal for age. Extremities: No clubbing or cyanosis.  2+ pitting edema to the sacrum with changes consistent with chronic woody edema. Distal pedal pulses are 2+ and equal bilaterally. Neuro: Alert and oriented X 3. No facial asymmetry. No focal deficit. Moves all extremities spontaneously. Psych:  Responds to questions appropriately with a normal affect.   EKG:  The EKG was personally reviewed and demonstrates: sinus tachycardia, 102 bpm, low voltage, nonspecific st/t changes Telemetry:  Telemetry was personally reviewed and demonstrates: NSR  Weights: Filed Weights   03/10/18 2023  Weight: 124.7 kg    Relevant CV Studies: Echo 04/2016: Study Conclusions  - Left ventricle: The cavity size was normal. Wall thickness was   normal. Systolic function was mildly to moderately reduced. The   estimated ejection fraction was in the range of 40% to 45%.   Diffuse hypokinesis. Doppler parameters are consistent with   abnormal left ventricular relaxation (grade 1 diastolic   dysfunction). - Mitral valve: There was mild regurgitation. - Left atrium: The atrium was mildly dilated. Anterior-posterior   dimension: 44 mm. - Right ventricle: The cavity size was mildly dilated. Wall    thickness was normal. - Right atrium: The atrium was mildly dilated.  Impressions:  - EF is much improved when compared to prior study (10-15%) __________  [facility] 04/2015: Conclusion   1. No significant coronary artery disease.   2. Near-normal left and right heart filling pressures. 3. Preserved cardiac output.  4. 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    Laboratory Data:  Chemistry Recent Labs  Lab 03/10/18 2025 03/10/18 2038  NA 140 145  K 3.6 3.3*  CL 110 110  CO2 20*  --   GLUCOSE 112* 101*  BUN 13 14  CREATININE 1.06 0.70  CALCIUM 7.7*  --   GFRNONAA >60  --   GFRAA >60  --   ANIONGAP 10  --     Recent Labs  Lab 03/10/18 2025  PROT 6.0*  ALBUMIN 2.0*  AST 29  ALT 36  ALKPHOS 99  BILITOT 4.9*   Hematology Recent Labs  Lab 03/10/18 2025 03/10/18 2038  WBC 4.7  --   RBC 4.06*  --   HGB 12.3* 12.6*  HCT 37.9* 37.0*  MCV 93.3  --   MCH 30.3  --   MCHC 32.5  --   RDW 18.9*  --   PLT 197  --    Cardiac Enzymes Recent Labs  Lab 03/11/18 0114 03/11/18 0602  TROPONINI <0.03 0.04*    Recent Labs  Lab 03/10/18 2037  TROPIPOC 0.00    BNP Recent Labs  Lab 03/10/18 2026  BNP 2,304.5*    DDimer No results for input(s): DDIMER in the last 168 hours.  Radiology/Studies:  Dg Chest Port 1 View  Result Date: 03/10/2018 IMPRESSION: Patchy opacity right base concerning for pneumonia. Lungs elsewhere clear. There is cardiomegaly. No adenopathy evident. Electronically Signed   By: Lowella Grip III M.D.   On: 03/10/2018 21:54   US Abdomen Limited Ruq  Result Date: 03/11/2018 IMPRESSION: 1.  Moderate ascites. 2. Sludge and cholelithiasis. Gallbladder wall is mildly thickened. This mild wall  thickening may be due to ascites. Acute cholecystitis cannot be excluded, however. In this regard, it may be reasonable to consider nuclear medicine hepatobiliary imaging study to assess for cystic duct  patency. 3. Nodular liver contour, a finding indicative of hepatic cirrhosis. Electronically Signed   By: Lowella Grip III M.D.   On: 03/11/2018 01:09    Assessment and Plan:   1.  Acute on chronic systolic CHF secondary to nonischemic cardiomyopathy: -Exacerbated by dietary and medication noncompliance -Patient is significantly volume overloaded  -Continue with IV Lasix 40 mg twice daily with KCl repletion as indicated.  May need to escalate diuretic therapy based on urine output -Echocardiogram pending -Not currently on beta-blocker secondary to decompensated heart failure -Has not been maintained on ACE inhibitor secondary to angioedema -Would look to resume Imdur/hydralazine/spironolactone/beta-blocker as he progresses through his admission -Suspect some of his fluid retention is related to third spacing in the setting of his hypoalbuminemia -CHF education -Strict I's and O's  2.  Ascites/elevated INR/elevated total bilirubin/hypoalbuminemia: -Has been previously question if patient has cirrhosis however imaging has not previously appeared to be cirrhotic -Work-up per internal medicine  3.  Hypomagnesemia: -Recommend repletion to goal 2.0 -Has received 800 mg magnesium oxide p.o. this morning   For questions or updates, please contact [facility] Please consult [email or web address] for contact info under Cardiology/STEMI.   Signed, Christell Faith, PA-C [place] Pager: [phone] 03/11/2018, 10:23 AM   Attending Note:   The patient was seen and examined.  Agree with assessment and plan as noted above.  Changes made to the above note as needed.  Patient seen and independently examined with Christell Faith, PA .   We discussed all aspects of the encounter. I agree with the assessment and plan as stated above.  1.  Acute on Chronic combined CHF Pt is admitted with massive anasarca Has not taken his meds for the past year Is responding well to lasix   Will repeat echo .   Will gradually restart his home meds and titrate up  Will restart coreg once his volume status US better.     I have spent a total of 40 minutes with patient reviewing hospital  notes , telemetry, EKGs, labs and examining patient as well as establishing an assessment and plan that was discussed with the patient. > 50% of time was spent in direct patient care.    Thayer Headings, Brooke Bonito., MD, [facility] 03/11/2018, 11:49 AM [address]  [place] Pager [phone]

## 2018-03-11 NOTE — ED Notes (Signed)
Pt awake up in bed eating breakfast.

## 2018-03-11 NOTE — ED Notes (Signed)
Admitting at bedside 

## 2018-03-11 NOTE — Progress Notes (Signed)
PROGRESS NOTE    Khris Jansson  KWI:[id number] DOB: 1965/08/10 DOA: 03/10/2018 PCP: Dorena Dew, FNP   Brief Narrative:  52 year old with a history of combined systolic and diastolic congestive heart failure with ejection fraction 40%, medical noncompliance came to the hospital with complains of progressive shortness of breath and generalized edema.  Patient stated he stopped taking his medication several months ago.  He used to follow with Dr. Aundra Dubin, last seen by them 2 years ago.  He was admitted as he was found to be in severe fluid overload and CHF exacerbation.   Assessment & Plan:   Principal Problem:   Acute hypoxemic respiratory failure (HCC) Active Problems:   Chronic anemia   Hypoalbuminemia   HTN (hypertension)   Acute exacerbation of CHF (congestive heart failure) (HCC)   Chest pain   Hyperbilirubinemia   Acute hypoxic respiratory distress with hypoxia, 70% on room air Acute exacerbation of combined systolic and diastolic congestive heart failure, ejection fraction 24%, grade 1 diastolic dysfunction, class IV Generalized anasarca - Increase diuretics to Lasix 80 mg twice daily, aggressively replete electrolytes -Daily input and output, fluid restriction 1200 cc -Order condom catheter for accurate input and output - Repeat echocardiogram ordered - Consulted cardiology - Wean off oxygen as deemed necessary - Right upper extremity more swollen than left, I suspect this is due to blood pressure cuff which I have taken off.  If this remains the case we will consider getting an ultrasound. Very low suspicion for DVT at this time.   Elevated troponin -Suspect this is from demand ischemia from fluid overload.  Patient is chest pain-free.  Continue to monitor him.  Elevated total bilirubin - Unknown etiology, possible from congestion.   -Right upper quadrant ultrasound-moderate ascites, sludging and cholelithiasis with mildly thickened gallbladder wall.  Unable to  rule out acute cholecystitis. - Ordered HIDA scan but I am not sure if he will be able to lay flat at this time.  Medication noncompliance - Counseled on importance of remain medically compliant.   DVT prophylaxis: Lovenox Code Status: Full code Family Communication: None Disposition Plan: Maintain inpatient stay for aggressive IV diuresis.  Consultants:   Cardiology  Procedures:   None  Antimicrobials:   None   Subjective: States he feels slightly better at rest but still has significant amount of exertional dyspnea even with minimal movement.  Still has 3+ pitting edema bilateral upper and lower extremity.  Review of Systems Otherwise negative except as per HPI, including: General: Denies fever, chills, night sweats or unintended weight loss. Resp: Denies cough, wheezing, shortness of breath. Cardiac: Denies chest pain, palpitations, orthopnea, paroxysmal nocturnal dyspnea. GI: Denies abdominal pain, nausea, vomiting, diarrhea or constipation GU: Denies dysuria, frequency, hesitancy or incontinence MS: Denies muscle aches, joint pain or swelling Neuro: Denies headache, neurologic deficits (focal weakness, numbness, tingling), abnormal gait Psych: Denies anxiety, depression, SI/HI/AVH Skin: Denies new rashes or lesions ID: Denies sick contacts, exotic exposures, travel  Objective: Vitals:   03/11/18 0600 03/11/18 0630 03/11/18 0730 03/11/18 0800  BP: 97/86 104/88 112/89 100/86  Pulse:  83 85   Resp:  12 13 19   SpO2:  100% 100%   Weight:      Height:       No intake or output data in the 24 hours ending 03/11/18 1013 Filed Weights   03/10/18 2023  Weight: 124.7 kg    Examination:  General exam: Appears calm and comfortable  Respiratory system: Clear to auscultation. Respiratory effort normal.  Cardiovascular system: S1 & S2 heard, RRR. No JVD, murmurs, rubs, gallops or clicks.  Generalized anasarca with 3+ pitting edema bilateral upper and lower  extremity Gastrointestinal system: Abdomen is nondistended, soft and nontender. No organomegaly or masses felt. Normal bowel sounds heard. Central nervous system: Alert and oriented. No focal neurological deficits. Extremities: Symmetric 5 x 5 power. Skin: No rashes, lesions or ulcers Psychiatry: Judgement and insight appear normal. Mood & affect appropriate.     Data Reviewed:   CBC: Recent Labs  Lab 03/10/18 2025 03/10/18 2038  WBC 4.7  --   NEUTROABS 3.8  --   HGB 12.3* 12.6*  HCT 37.9* 37.0*  MCV 93.3  --   PLT 197  --    Basic Metabolic Panel: Recent Labs  Lab 03/10/18 2025 03/10/18 2038 03/11/18 0114  NA 140 145  --   K 3.6 3.3*  --   CL 110 110  --   CO2 20*  --   --   GLUCOSE 112* 101*  --   BUN 13 14  --   CREATININE 1.06 0.70  --   CALCIUM 7.7*  --   --   MG  --   --  1.5*   GFR: Estimated Creatinine Clearance: 143.9 mL/min (by C-G formula based on SCr of 0.7 mg/dL). Liver Function Tests: Recent Labs  Lab 03/10/18 2025  AST 29  ALT 36  ALKPHOS 99  BILITOT 4.9*  PROT 6.0*  ALBUMIN 2.0*   No results for input(s): LIPASE, AMYLASE in the last 168 hours. No results for input(s): AMMONIA in the last 168 hours. Coagulation Profile: Recent Labs  Lab 03/11/18 0602  INR 2.19   Cardiac Enzymes: Recent Labs  Lab 03/11/18 0114 03/11/18 0602  TROPONINI <0.03 0.04*   BNP (last 3 results) No results for input(s): PROBNP in the last 8760 hours. HbA1C: No results for input(s): HGBA1C in the last 72 hours. CBG: No results for input(s): GLUCAP in the last 168 hours. Lipid Profile: No results for input(s): CHOL, HDL, LDLCALC, TRIG, CHOLHDL, LDLDIRECT in the last 72 hours. Thyroid Function Tests: No results for input(s): TSH, T4TOTAL, FREET4, T3FREE, THYROIDAB in the last 72 hours. Anemia Panel: No results for input(s): VITAMINB12, FOLATE, FERRITIN, TIBC, IRON, RETICCTPCT in the last 72 hours. Sepsis Labs: Recent Labs  Lab 03/11/18 0114   PROCALCITON 0.19    No results found for this or any previous visit (from the past 240 hour(s)).       Radiology Studies: Dg Chest Port 1 View  Result Date: 03/10/2018 CLINICAL DATA:  Shortness of breath EXAM: PORTABLE CHEST 1 VIEW COMPARISON:  Chest radiograph April 22, 2015 and chest CT April 23, 2015 FINDINGS: There is patchy opacity in the right base region. Lungs elsewhere are clear. There is cardiomegaly with pulmonary vascularity normal. No adenopathy. There is degenerative change in each shoulder. IMPRESSION: Patchy opacity right base concerning for pneumonia. Lungs elsewhere clear. There is cardiomegaly. No adenopathy evident. Electronically Signed   By: Lowella Grip III M.D.   On: 03/10/2018 21:54   US Abdomen Limited Ruq  Result Date: 03/11/2018 CLINICAL DATA:  Elevated serum bilirubin EXAM: ULTRASOUND ABDOMEN LIMITED RIGHT UPPER QUADRANT COMPARISON:  None. FINDINGS: Gallbladder: Gallbladder is filled with sludge. In addition, there are intermingled gallstones, largest measuring 1 cm. Gallbladder wall is mildly thickened but does not appear edematous. There is no pericholecystic fluid. No sonographic Murphy sign noted by sonographer. Common bile duct: Diameter: 4 mm proximally. Distal common bile duct  could not be visualized due to overlying gas. Liver: Liver has a nodular contour.  The liver echogenicity is normal. The portal vein is patent on color Doppler imaging with normal direction of blood flow towards the liver. There is moderate ascites.  There is also a right pleural effusion. IMPRESSION: 1.  Moderate ascites. 2. Sludge and cholelithiasis. Gallbladder wall is mildly thickened. This mild wall thickening may be due to ascites. Acute cholecystitis cannot be excluded, however. In this regard, it may be reasonable to consider nuclear medicine hepatobiliary imaging study to assess for cystic duct patency. 3. Nodular liver contour, a finding indicative of hepatic cirrhosis.  Electronically Signed   By: Lowella Grip III M.D.   On: 03/11/2018 01:09        Scheduled Meds: . enoxaparin (LOVENOX) injection  40 mg Subcutaneous Daily  . furosemide  40 mg Intravenous BID  . magnesium oxide  800 mg Oral Once  . potassium chloride  40 mEq Oral Once   Continuous Infusions:   LOS: 1 day   Time spent= 45 mins    Iley Deignan Arsenio Loader, MD [facility] Pager [phone]   If 7PM-7AM, please contact night-coverage [email or web address] Password [email or web address] 03/11/2018, 10:13 AM

## 2018-03-11 NOTE — ED Notes (Signed)
Report called to 3E

## 2018-03-11 NOTE — ED Notes (Signed)
Dr Amin in w/pt. 

## 2018-03-11 NOTE — ED Notes (Signed)
Condom catheter applied to pt with leg strap and drainage bag.

## 2018-03-11 NOTE — ED Notes (Signed)
Pt having difficult time with eating salad and called service response to request a protein shake.

## 2018-03-11 NOTE — Progress Notes (Signed)
  Echocardiogram 2D Echocardiogram has been performed.  Kyle Conrad F 03/11/2018, 4:45 PM

## 2018-03-11 NOTE — Progress Notes (Signed)
Per patient he injured his penis with the toilet seat at home over a week ago. The penis is swollen and disfigured. Patient has a large abrasion to the left ankle and open areas between some of the toes. And small places to the right lower leg. The patient's is overall in very poor condition with gross edema generalized and acknowledging that he has not taken any medication nor has he seen a doctor in over 2 years.

## 2018-03-11 NOTE — ED Notes (Signed)
Attempted to assist patient to move to a more comfortable bed.  Unable to stand due to shortness of breath.  Assisted back to lying on stretcher and pulled over to hospital bed with 3 person assist.  Tolerated well.  Reports being more comfortable.  Incontinent of large amount of urine.  Peri care provided.

## 2018-03-11 NOTE — ED Notes (Signed)
Pt eating breakfast 

## 2018-03-11 NOTE — ED Notes (Addendum)
Moved pts BP cuff on his left ankle due to significant swelling in both upper extremities. Condom Cathter readjusted - having a difficult time keeping on due to penis and scrotum swelling and drainage. Pts arms and legs elevated on clean dry linens due to swelling.

## 2018-03-12 ENCOUNTER — Inpatient Hospital Stay (HOSPITAL_COMMUNITY): Payer: Self-pay

## 2018-03-12 ENCOUNTER — Inpatient Hospital Stay: Payer: Self-pay

## 2018-03-12 DIAGNOSIS — I5023 Acute on chronic systolic (congestive) heart failure: Secondary | ICD-10-CM

## 2018-03-12 DIAGNOSIS — J9601 Acute respiratory failure with hypoxia: Secondary | ICD-10-CM

## 2018-03-12 LAB — ECHOCARDIOGRAM COMPLETE
HEIGHTINCHES: 72 in
Weight: 4400 oz

## 2018-03-12 LAB — BASIC METABOLIC PANEL
Anion gap: 9 (ref 5–15)
BUN: 13 mg/dL (ref 6–20)
CO2: 23 mmol/L (ref 22–32)
Calcium: 8.2 mg/dL — ABNORMAL LOW (ref 8.9–10.3)
Chloride: 107 mmol/L (ref 98–111)
Creatinine, Ser: 1.02 mg/dL (ref 0.61–1.24)
GFR calc Af Amer: >60 mL/min (ref >60)
GLUCOSE: 105 mg/dL — AB (ref 70–99)
POTASSIUM: 3.4 mmol/L — AB (ref 3.5–5.1)
Sodium: 139 mmol/L (ref 135–145)

## 2018-03-12 LAB — MAGNESIUM: Magnesium: 1.5 mg/dL — ABNORMAL LOW (ref 1.7–2.4)

## 2018-03-12 MED ORDER — HYDRALAZINE HCL 25 MG PO TABS
12.5000 mg | ORAL_TABLET | Freq: Three times a day (TID) | ORAL | Status: DC
  Administered 2018-03-12 – 2018-03-17 (×14): 12.5 mg via ORAL
  Filled 2018-03-12 (×15): qty 1

## 2018-03-12 MED ORDER — ACETAMINOPHEN 325 MG PO TABS
650.0000 mg | ORAL_TABLET | Freq: Four times a day (QID) | ORAL | Status: DC | PRN
  Administered 2018-03-12 – 2018-03-14 (×2): 650 mg via ORAL
  Filled 2018-03-12 (×3): qty 2

## 2018-03-12 MED ORDER — SODIUM CHLORIDE 0.9% FLUSH
10.0000 mL | INTRAVENOUS | Status: DC | PRN
  Administered 2018-03-13: 10 mL
  Administered 2018-03-14 – 2018-03-16 (×2): 20 mL
  Administered 2018-03-20: 30 mL
  Filled 2018-03-12 (×4): qty 40

## 2018-03-12 MED ORDER — POTASSIUM CHLORIDE CRYS ER 20 MEQ PO TBCR
40.0000 meq | EXTENDED_RELEASE_TABLET | Freq: Two times a day (BID) | ORAL | Status: AC
  Administered 2018-03-12 (×2): 40 meq via ORAL
  Filled 2018-03-12 (×2): qty 2

## 2018-03-12 MED ORDER — SODIUM CHLORIDE 0.9% FLUSH
10.0000 mL | Freq: Two times a day (BID) | INTRAVENOUS | Status: DC
  Administered 2018-03-14: 10 mL

## 2018-03-12 MED ORDER — TECHNETIUM TC 99M MEBROFENIN IV KIT
5.3000 | PACK | Freq: Once | INTRAVENOUS | Status: AC | PRN
  Administered 2018-03-12: 5.3 via INTRAVENOUS

## 2018-03-12 MED ORDER — ISOSORBIDE MONONITRATE ER 30 MG PO TB24
30.0000 mg | ORAL_TABLET | Freq: Every day | ORAL | Status: DC
  Administered 2018-03-12 – 2018-03-17 (×6): 30 mg via ORAL
  Filled 2018-03-12 (×7): qty 1

## 2018-03-12 MED ORDER — FUROSEMIDE 10 MG/ML IJ SOLN
10.0000 mg/h | INTRAVENOUS | Status: DC
  Administered 2018-03-12 – 2018-03-15 (×5): 15 mg/h via INTRAVENOUS
  Administered 2018-03-16: 10 mg/h via INTRAVENOUS
  Filled 2018-03-12: qty 21
  Filled 2018-03-12 (×4): qty 25
  Filled 2018-03-12: qty 21
  Filled 2018-03-12: qty 20

## 2018-03-12 MED ORDER — MAGNESIUM OXIDE 400 (241.3 MG) MG PO TABS
800.0000 mg | ORAL_TABLET | Freq: Two times a day (BID) | ORAL | Status: AC
  Administered 2018-03-12 (×2): 800 mg via ORAL
  Filled 2018-03-12 (×2): qty 2

## 2018-03-12 MED ORDER — SPIRONOLACTONE 12.5 MG HALF TABLET
12.5000 mg | ORAL_TABLET | Freq: Every day | ORAL | Status: DC
  Administered 2018-03-12: 12.5 mg via ORAL
  Filled 2018-03-12 (×2): qty 1

## 2018-03-12 MED ORDER — SPIRONOLACTONE 12.5 MG HALF TABLET
12.5000 mg | ORAL_TABLET | Freq: Every day | ORAL | Status: DC
  Filled 2018-03-12: qty 1

## 2018-03-12 NOTE — Progress Notes (Signed)
Peripherally Inserted Central Catheter/Midline Placement  The IV Nurse has discussed with the patient and/or persons authorized to consent for the patient, the purpose of this procedure and the potential benefits and risks involved with this procedure.  The benefits include less needle sticks, lab draws from the catheter, and the patient may be discharged home with the catheter. Risks include, but not limited to, infection, bleeding, blood clot (thrombus formation), and puncture of an artery; nerve damage and irregular heartbeat and possibility to perform a PICC exchange if needed/ordered by physician.  Alternatives to this procedure were also discussed.  Bard Power PICC patient education guide, fact sheet on infection prevention and patient information card has been provided to patient /or left at bedside.    PICC/Midline Placement Documentation  PICC Double Lumen 57/47/34 PICC Left Cephalic 42 cm 0 cm (Active)  Indication for Insertion or Continuance of Line Vasoactive infusions;Chronic illness with exacerbations (CF, Sickle Cell, etc.) 03/12/2018  8:29 PM  Exposed Catheter (cm) 0 cm 03/12/2018  8:29 PM  Site Assessment Clean;Dry;Intact 03/12/2018  8:29 PM  Lumen #1 Status Flushed;Saline locked;Blood return noted 03/12/2018  8:29 PM  Lumen #2 Status Flushed;Saline locked;Blood return noted 03/12/2018  8:29 PM  Dressing Type Transparent 03/12/2018  8:29 PM  Dressing Status Clean;Dry;Intact;Antimicrobial disc in place 03/12/2018  8:29 PM  Dressing Change Due 03/19/18 03/12/2018  8:29 PM       Gordan Payment 03/12/2018, 8:32 PM

## 2018-03-12 NOTE — Progress Notes (Signed)
Paged MD  Reesa Chew  A for cardiac order that expired.

## 2018-03-12 NOTE — Care Management Note (Signed)
Case Management Note  Patient Details  Name: Kyle Conrad MRN: [id number] Date of Birth: [birth year]  Subjective/Objective:   Acute Hypoxic Resp Failure                Action/Plan: Patient lives at home; PCP: Dorena Dew, FNP; noted no medical insurance, Financial Counselor to see the patient to determine what he might qualify for; CM following for progression of care; awaiting for Physical Therapy for disposition needs.  Expected Discharge Date:  Possibly 03/16/2018               Expected Discharge Plan:  Home/Self Care  In-House Referral:   Financial Counselor  Discharge planning Services  CM Consult  Status of Service:  In process, will continue to follow  Sherrilyn Rist [phone] 03/12/2018, 10:55 AM

## 2018-03-12 NOTE — Progress Notes (Signed)
Paged Ortho tech

## 2018-03-12 NOTE — Consult Note (Addendum)
Advanced Heart Failure Team Consult Note   Primary Physician: Dorena Dew, FNP PCP-Cardiologist:  Dr Aundra Dubin  Reason for Consultation:  Heart Failure  HPI:    Kyle Conrad is seen today for evaluation of heart failure  at the request of Dr Reesa Chew  Kyle Conrad is a 52 year old with a history of NICM,  Angioedema with ACE, anemia, HTN, cirrhosis,  OSA, and chronic systolic heart failure.   He was last seen in the HF clinic a few years ago. Says he ran out of medications and never called for a refill   Off HF medications for almost 2 years. On 10/26 he was had increase shortness of breath at [employer]. Says he almost passed out. Presented to ED via EMS. Initially on NRB. CXR with opacity RLL concerning for pneumonia. Started on IV lasix 80 mg twice a day. Pertinent admission labs included: Troponin 0.03>0.04 , Procalcitonin 0.19, BNP 2304, creatinine 0.7, K 3.3 .  Complaining increased shortness of breath. Denies chest pain.   Echo this admit pending.  ECHO EF Biventricular HF 10-15%.  Echo 2017 EF 40-45% Grade 1DD  RV  Echo 2016 EF 20-25%   LHC 2016 no coronary artery disease.   Review of Systems: [y] = yes, [ ]  = no   General: Weight gain [Y ]; Weight loss [ ] ; Anorexia [ ] ; Fatigue [Y ]; Fever [ ] ; Chills [ ] ; Weakness [Y ]  Cardiac: Chest pain/pressure [ ] ; Resting SOB [Y ]; Exertional SOB [ Y]; Orthopnea [Y ]; Pedal Edema [Y  ]; Palpitations [ ] ; Syncope [ ] ; Presyncope [ ] ; Paroxysmal nocturnal dyspnea[ ]   Pulmonary: Cough [ ] ; Wheezing[ ] ; Hemoptysis[ ] ; Sputum [ ] ; Snoring [ ]   GI: Vomiting[ ] ; Dysphagia[ ] ; Melena[ ] ; Hematochezia [ ] ; Heartburn[ ] ; Abdominal pain [ ] ; Constipation [ ] ; Diarrhea [ ] ; BRBPR [ ]   GU: Hematuria[ ] ; Dysuria [ ] ; Nocturia[ ]   Vascular: Pain in legs with walking [ ] ; Pain in feet with lying flat [ ] ; Non-healing sores [ ] ; Stroke [ ] ; TIA [ ] ; Slurred speech [ ] ;  Neuro: Headaches[ ] ; Vertigo[ ] ; Seizures[ ] ; Paresthesias[ ] ;Blurred vision [  ]; Diplopia [ ] ; Vision changes [ ]   Ortho/Skin: Arthritis [ ] ; Joint pain [Y ]; Muscle pain [ ] ; Joint swelling [ ] ; Back Pain [Y ]; Rash [ ]   Psych: Depression[ ] ; Anxiety[ ]   Heme: Bleeding problems [ ] ; Clotting disorders [ ] ; Anemia [ ]   Endocrine: Diabetes [ ] ; Thyroid dysfunction[ ]   Home Medications Prior to Admission medications   Medication Sig Start Date End Date Taking? Authorizing Provider  OVER THE COUNTER MEDICATION Take 1 Bottle by mouth daily as needed (when cycling).   Yes [provider]  carvedilol (COREG) 25 MG tablet Take 1 tablet (25 mg total) by mouth 2 (two) times daily with a meal. Needs office visit Patient not taking: Reported on 03/10/2018 10/04/16   Larey Dresser, MD  digoxin (DIGITEK) 0.125 MG tablet Take 1 tablet (125 mcg total) by mouth daily. Needs office visit Patient not taking: Reported on 03/10/2018 10/04/16   Larey Dresser, MD  furosemide (LASIX) 40 MG tablet Take 1 tablet (40 mg total) by mouth daily. Take an additional 20 mg (1/2 tablet) for 3 lb weight gain in 1 day or 5 lb weight gain in 1 week Patient not taking: Reported on 03/10/2018 02/16/16   Larey Dresser, MD  hydrALAZINE (APRESOLINE) 100 MG tablet  Take 1 tablet (100 mg total) by mouth 3 (three) times daily. Patient not taking: Reported on 03/10/2018 04/18/16   Larey Dresser, MD  isosorbide mononitrate (IMDUR) 120 MG 24 hr tablet Take 1 tablet (120 mg total) by mouth daily. Patient not taking: Reported on 03/10/2018 05/30/16   Larey Dresser, MD  omeprazole (PRILOSEC) 20 MG capsule TAKE 1 CAPSULE BY MOUTH DAILY Patient not taking: Reported on 03/10/2018 04/15/16   Larey Dresser, MD  spironolactone (ALDACTONE) 25 MG tablet Take 1 tablet (25 mg total) by mouth daily. Needs office visit Patient not taking: Reported on 03/10/2018 10/04/16   Larey Dresser, MD  spironolactone (ALDACTONE) 50 MG tablet Take 1 tablet (50 mg total) by mouth daily. Patient not taking: Reported on  03/10/2018 05/30/16   Larey Dresser, MD    Past Medical History: Past Medical History:  Diagnosis Date  . CHF (congestive heart failure) ([place])   . Hypertension     Past Surgical History: Past Surgical History:  Procedure Laterality Date  . CARDIAC CATHETERIZATION N/A 04/30/2015   Procedure: Right/Left Heart Cath and Coronary Angiography;  Surgeon: Larey Dresser, MD;  Location: [facility];  Service: Cardiovascular;  Laterality: N/A;  . COLONOSCOPY WITH PROPOFOL N/A 05/01/2015   Procedure: COLONOSCOPY WITH PROPOFOL;  Surgeon: Wilford Corner, MD;  Location: [facility] ENDOSCOPY;  Service: Endoscopy;  Laterality: N/A;  . ESOPHAGOGASTRODUODENOSCOPY (EGD) WITH PROPOFOL N/A 05/01/2015   Procedure: ESOPHAGOGASTRODUODENOSCOPY (EGD) WITH PROPOFOL;  Surgeon: Wilford Corner, MD;  Location: [facility] ENDOSCOPY;  Service: Endoscopy;  Laterality: N/A;  . SHOULDER SURGERY      Family History: Family History  Problem Relation Age of Onset  . Lung cancer Mother     Social History: Social History   Socioeconomic History  . Marital status: Single    Spouse name: Not on file  . Number of children: Not on file  . Years of education: Not on file  . Highest education level: Not on file  Occupational History  . Not on file  Social Needs  . Financial resource strain: Not on file  . Food insecurity:    Worry: Not on file    Inability: Not on file  . Transportation needs:    Medical: Not on file    Non-medical: Not on file  Tobacco Use  . Smoking status: Never Smoker  . Smokeless tobacco: Never Used  Substance and Sexual Activity  . Alcohol use: No  . Drug use: No  . Sexual activity: Not on file  Lifestyle  . Physical activity:    Days per week: Not on file    Minutes per session: Not on file  . Stress: Not on file  Relationships  . Social connections:    Talks on phone: Not on file    Gets together: Not on file    Attends religious service: Not on file    Active member of club  or organization: Not on file    Attends meetings of clubs or organizations: Not on file    Relationship status: Not on file  Other Topics Concern  . Not on file  Social History Narrative  . Not on file    Allergies:  Allergies  Allergen Reactions  . Lisinopril Swelling    REACTION: pt had a swelling episode.  His lips swelled  . Penicillins Hives    Has patient had a PCN reaction causing immediate rash, facial/tongue/throat swelling, SOB or lightheadedness with hypotension: Yes Has patient had  a PCN reaction causing severe rash involving mucus membranes or skin necrosis: No Has patient had a PCN reaction that required hospitalization: No Has patient had a PCN reaction occurring within the last 10 years: No If all of the above answers are "NO", then may proceed with Cephalosporin use.    Objective:    Vital Signs:   Temp:  [97.4 F (36.3 C)-98.5 F (36.9 C)] 97.4 F (36.3 C) (10/28 0439) Pulse Rate:  [80-100] 87 (10/28 0439) Resp:  [11-26] 18 (10/28 0711) BP: (102-143)/(75-106) 124/94 (10/28 0711) SpO2:  [90 %-100 %] 100 % (10/28 0711) Weight:  [111.1 kg-111.3 kg] 111.1 kg (10/28 0439) Last BM Date: 03/10/18  Weight change: Filed Weights   03/10/18 2023 03/11/18 1515 03/12/18 0439  Weight: 124.7 kg 111.3 kg 111.1 kg    Intake/Output:   Intake/Output Summary (Last 24 hours) at 03/12/2018 0917 Last data filed at 03/12/2018 0451 Gross per 24 hour  Intake 240 ml  Output 2150 ml  Net -1910 ml      Physical Exam    General:  Massive Anasarca.  Dyspneic when he talking.  HEENT: normal Neck: supple. JVP to ear  . Carotids 2+ bilat; no bruits. No lymphadenopathy or thyromegaly appreciated. Cor: PMI nondisplaced. Regular rate & rhythm. No rubs, gallops or murmurs. Lungs: decreaed in the bases on 1 lite=r Abdomen: soft, nontender, distended. No hepatosplenomegaly. No bruits or masses. Good bowel sounds. Extremities: no cyanosis, clubbing, rash, R and LLE 3+   edema Neuro: alert & orientedx3, cranial nerves grossly intact. moves all 4 extremities w/o difficulty. Affect pleasant   Telemetry   SR 90s personally reviewed EKG    SR 101 bpm low volts    Labs   Basic Metabolic Panel: Recent Labs  Lab 03/10/18 2025 03/10/18 2038 03/11/18 0114 03/12/18 0512  NA 140 145  --  139  K 3.6 3.3*  --  3.4*  CL 110 110  --  107  CO2 20*  --   --  23  GLUCOSE 112* 101*  --  105*  BUN 13 14  --  13  CREATININE 1.06 0.70  --  1.02  CALCIUM 7.7*  --   --  8.2*  MG  --   --  1.5* 1.5*    Liver Function Tests: Recent Labs  Lab 03/10/18 2025  AST 29  ALT 36  ALKPHOS 99  BILITOT 4.9*  PROT 6.0*  ALBUMIN 2.0*   No results for input(s): LIPASE, AMYLASE in the last 168 hours. No results for input(s): AMMONIA in the last 168 hours.  CBC: Recent Labs  Lab 03/10/18 2025 03/10/18 2038  WBC 4.7  --   NEUTROABS 3.8  --   HGB 12.3* 12.6*  HCT 37.9* 37.0*  MCV 93.3  --   PLT 197  --     Cardiac Enzymes: Recent Labs  Lab 03/11/18 0114 03/11/18 0602  TROPONINI <0.03 0.04*    BNP: BNP (last 3 results) Recent Labs    03/10/18 2026  BNP 2,304.5*    ProBNP (last 3 results) No results for input(s): PROBNP in the last 8760 hours.   CBG: No results for input(s): GLUCAP in the last 168 hours.  Coagulation Studies: Recent Labs    03/11/18 0602  LABPROT 24.1*  INR 2.19     Imaging    No results found.   Medications:     Current Medications: . enoxaparin (LOVENOX) injection  40 mg Subcutaneous Daily  . furosemide  80  mg Intravenous BID  . magnesium oxide  800 mg Oral BID WC  . potassium chloride  40 mEq Oral BID WC     Infusions:     Patient Profile   Kyle Conrad is a 52 year old with a history of NICM,  Angioedema with ACE, anemia, HTN, cirrhosis,  OSA, and chronic systolic heart failure.   Admitted with massive anasarca and A/C systolic heart failure.    Assessment/Plan   1. A/C Systolic Heart  Failure, NICM  Massive volume overload. Stop intermittent IV lasix. Start lasix drip 15 mg per hour.  Place PICC. Check CO-OX once PICC placed.  - No bb for now.  - Start 12.5 mg spironolactone daily - Start hydralazine 12.5 mg three times a day. Add 30 mg imdur   2. Abdominal Pain  HIDDA scan today. WBC not elevated.  Maybe realted to low output.   3. CXR with questionable pneumonia Not on antibiotics.   4. Noncompliance  He will need close follow up. Need to identify barriers.  - He has trouble paying for medications.    Medication concerns reviewed with patient and pharmacy team. Barriers identified: Yes he will need HF medications.   Length of Stay: 2  Darrick Grinder, NP  03/12/2018, 9:17 AM  Advanced Heart Failure Team Pager [phone] (M-F; 7a - 4p)  Please contact [facility] for night-coverage after hours (4p -7a ) and weekends on [email or web address]  Patient seen with NP, agree with the above note.    Echo reviewed: EF 20-25% with severe LV dilation, severe RV dilation with severe systolic dysfunction, severe Kyle and severe TR (likely functional).    He has a history of NICM but quit taking all his medications x 2 years.  He developed progressive dyspnea to the point where he was short of breath with any exertion.   On exam, he has anasarca.  JVP 16+ cm.  Heart regular S1S2 with 2/6 HSM LLSB/apex.   1. Acute on chronic systolic CHF: History of nonischemic cardiomyopathy, prior echo in 2017 with EF up to 40-45%. Cardiac MRI in 3/17 with no LGE, EF 40%. No ETOH intake, HIV negative. Quit taking all meds x 2 years. Echo this admission shows EF 20-25% with severe LV dilation, severe RV dilation with severe systolic dysfunction, severe Kyle and severe TR (likely functional). He is markedly volume overloaded with anasarca.  BP is stable.  - He got Lasix 80 mg IV x 1 today, will start Lasix gtt at 15 mg/hr.  - Start hydralazine 12.5 tid and Imdur 30 daily.   - Start spironolactone 12.5  daily.  - No beta blocker yet with marked volume overload.  - No ACEI or ARNI with angioedema with lisinopril.   - Place PICC, check co-ox.  - Narrow QRS, not candidate for CRT.  2. Abdominal pain: Abdominal US with Sludge/cholelithiasis in GB, cannot rule out acute cholecystitis but wall thickening could be due to ascites.  - Pending HIDA scan.  3. Cirrhosis: Liver has cirrhotic appearance on abdominal US.  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  He has moderate ascites.   Loralie Champagne 03/12/2018 1:05 PM

## 2018-03-12 NOTE — Progress Notes (Signed)
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  Patient stated he stopped taking his medication several months ago.  He used to follow with Dr. Aundra Dubin, last seen by them 2 years ago.  He was admitted as he was found to be in severe fluid overload and CHF exacerbation.   Assessment & Plan:   Principal Problem:   Acute hypoxemic respiratory failure (HCC) Active Problems:   Chronic anemia   Hypoalbuminemia   HTN (hypertension)   Acute exacerbation of CHF (congestive heart failure) (HCC)   Chest pain   Hyperbilirubinemia   Acute hypoxic respiratory distress with hypoxia, 70% on room air.  Requiring 3 L nasal cannula Acute exacerbation of combined systolic and diastolic congestive heart failure, ejection fraction 67%, grade 1 diastolic dysfunction, class IV Generalized anasarca - 80 mg of IV Lasix twice daily.  Will continue to monitor input and output. -Daily input and output, fluid restriction 1200 cc. Albumin 2.0 -Condom catheter in place to get accurate input and output - Echocardiogram results are pending at this time - Cardiology consulted - Wean off oxygen as deemed necessary  Elevated troponin, resolved -Patient is chest pain-free.  Elevated total bilirubin - Unknown etiology, possible from congestion.   -Right upper quadrant ultrasound-moderate ascites, sludging and cholelithiasis with mildly thickened gallbladder wall.  Unable to rule out acute cholecystitis. - HIDA scan done, results pending  Medication noncompliance - Counseled on importance of remain medically compliant.  DVT prophylaxis: Lovenox Code Status: Full code Family Communication:  None Disposition Plan: Maintain inpatient stay for IV diuresis.  Patient is significantly volume overloaded generalized anasarca and significant hypoxia with minimal exertion.  Consultants:   Cardiology  Procedures:   None  Antimicrobials:   None   Subjective: Remains very short of breath even with minimal exertion.  Review of Systems Otherwise negative except as per HPI, including: General = no fevers, chills, dizziness, malaise, fatigue HEENT/EYES = negative for pain, redness, loss of vision, double vision, blurred vision, loss of hearing, sore throat, hoarseness, dysphagia Cardiovascular= negative for chest pain, palpitation, murmurs Respiratory/lungs= negative for  cough, hemoptysis, wheezing, mucus production Gastrointestinal= negative for nausea, vomiting,, abdominal pain, melena, hematemesis Genitourinary= negative for Dysuria, Hematuria, Change in Urinary Frequency MSK = Negative for arthralgia, myalgias, Back Pain, Joint swelling  Neurology= Negative for headache, seizures, numbness, tingling  Psychiatry= Negative for anxiety, depression, suicidal and homocidal ideation Allergy/Immunology= Medication/Food allergy as listed  Skin= Negative for Rash, lesions, ulcers, itching    Objective: Vitals:   03/12/18 0054 03/12/18 0439 03/12/18 0711 03/12/18 1118  BP: (!) 129/97 (!) 142/102 (!) 124/94 123/86  Pulse: 87 87  91  Resp: 20 20 18 18   Temp: 98.1 F (36.7 C) (!) 97.4 F (36.3 C)    TempSrc: Oral Oral    SpO2: 97% 90% 100% 98%  Weight:  111.1 kg    Height:        Intake/Output Summary (Last 24 hours) at 03/12/2018 1215 Last data filed at 03/12/2018 1108 Gross per 24 hour  Intake 360 ml  Output 2850 ml  Net -2490 ml   Filed Weights   03/10/18 2023 03/11/18 1515 03/12/18 0439  Weight: 124.7 kg 111.3 kg 111.1 kg    Examination:  Constitutional: NAD, calm, comfortable, 2 L nasal cannula.  Generalized anasarca. Eyes: PERRL, lids and conjunctivae  normal ENMT: Mucous membranes are moist. Posterior pharynx clear of any exudate or lesions.Normal dentition.  Neck: normal, supple, no masses, no thyromegaly Respiratory: clear to auscultation bilaterally, no wheezing, no crackles. Normal respiratory effort. No accessory muscle use.  Cardiovascular: Regular rate and rhythm, no murmurs / rubs / gallops.2+ pedal pulses. No carotid bruits.  3+ bilateral pitting edema upper and lower extremities Abdomen: no tenderness, no masses palpated. No hepatosplenomegaly. Bowel sounds positive.  Musculoskeletal: no clubbing / cyanosis. No joint deformity upper and lower extremities. Good ROM, no contractures. Normal muscle tone.  Skin: no rashes, lesions, ulcers. No induration Neurologic: CN 2-12 grossly intact. Sensation intact, DTR normal. Strength 5/5 in all 4.  Psychiatric: Normal judgment and insight. Alert and oriented x 3. Normal mood.    Data Reviewed:   CBC: Recent Labs  Lab 03/10/18 2025 03/10/18 2038  WBC 4.7  --   NEUTROABS 3.8  --   HGB 12.3* 12.6*  HCT 37.9* 37.0*  MCV 93.3  --   PLT 197  --    Basic Metabolic Panel: Recent Labs  Lab 03/10/18 2025 03/10/18 2038 03/11/18 0114 03/12/18 0512  NA 140 145  --  139  K 3.6 3.3*  --  3.4*  CL 110 110  --  107  CO2 20*  --   --  23  GLUCOSE 112* 101*  --  105*  BUN 13 14  --  13  CREATININE 1.06 0.70  --  1.02  CALCIUM 7.7*  --   --  8.2*  MG  --   --  1.5* 1.5*   GFR: Estimated Creatinine Clearance: 106.6 mL/min (by C-G formula based on SCr of 1.02 mg/dL). Liver Function Tests: Recent Labs  Lab 03/10/18 2025  AST 29  ALT 36  ALKPHOS 99  BILITOT 4.9*  PROT 6.0*  ALBUMIN 2.0*   No results for input(s): LIPASE, AMYLASE in the last 168 hours. No results for input(s): AMMONIA in the last 168 hours. Coagulation Profile: Recent Labs  Lab 03/11/18 0602  INR 2.19   Cardiac Enzymes: Recent Labs  Lab 03/11/18 0114 03/11/18 0602  TROPONINI <0.03 0.04*   BNP (last 3  results) No results for input(s): PROBNP in the last 8760 hours. HbA1C: No results for input(s): HGBA1C in the last 72 hours. CBG: No results for input(s): GLUCAP in the last 168 hours. Lipid Profile: No results for input(s): CHOL, HDL, LDLCALC, TRIG, CHOLHDL, LDLDIRECT in the last 72 hours. Thyroid Function Tests: No results for input(s): TSH, T4TOTAL, FREET4, T3FREE, THYROIDAB in the last 72 hours. Anemia Panel: No results for input(s): VITAMINB12, FOLATE, FERRITIN, TIBC, IRON, RETICCTPCT in the last 72 hours. Sepsis Labs: Recent Labs  Lab 03/11/18 0114  PROCALCITON 0.19    No results found for this or any previous visit (from the past 240 hour(s)).       Radiology Studies: Dg Chest Port 1 View  Result Date: 03/10/2018 CLINICAL DATA:  Shortness of breath EXAM: PORTABLE CHEST 1 VIEW COMPARISON:  Chest radiograph April 22, 2015 and chest CT April 23, 2015 FINDINGS: There is patchy opacity in the right base region. Lungs elsewhere are clear. There is cardiomegaly with pulmonary vascularity normal. No adenopathy. There is degenerative change in each shoulder. IMPRESSION: Patchy opacity right base concerning for pneumonia. Lungs elsewhere clear. There is cardiomegaly. No adenopathy evident. Electronically Signed   By: Lowella Grip III  M.D.   On: 03/10/2018 21:54   [place] Ekg Site Rite  Result Date: 03/12/2018 If Site Rite image not attached, placement could not be confirmed due to current cardiac rhythm.  US Abdomen Limited Ruq  Result Date: 03/11/2018 CLINICAL DATA:  Elevated serum bilirubin EXAM: ULTRASOUND ABDOMEN LIMITED RIGHT UPPER QUADRANT COMPARISON:  None. FINDINGS: Gallbladder: Gallbladder is filled with sludge. In addition, there are intermingled gallstones, largest measuring 1 cm. Gallbladder wall is mildly thickened but does not appear edematous. There is no pericholecystic fluid. No sonographic Murphy sign noted by sonographer. Common bile duct: Diameter: 4 mm  proximally. Distal common bile duct could not be visualized due to overlying gas. Liver: Liver has a nodular contour.  The liver echogenicity is normal. The portal vein is patent on color Doppler imaging with normal direction of blood flow towards the liver. There is moderate ascites.  There is also a right pleural effusion. IMPRESSION: 1.  Moderate ascites. 2. Sludge and cholelithiasis. Gallbladder wall is mildly thickened. This mild wall thickening may be due to ascites. Acute cholecystitis cannot be excluded, however. In this regard, it may be reasonable to consider nuclear medicine hepatobiliary imaging study to assess for cystic duct patency. 3. Nodular liver contour, a finding indicative of hepatic cirrhosis. Electronically Signed   By: Lowella Grip III M.D.   On: 03/11/2018 01:09        Scheduled Meds: . enoxaparin (LOVENOX) injection  40 mg Subcutaneous Daily  . hydrALAZINE  12.5 mg Oral Q8H  . isosorbide mononitrate  30 mg Oral Daily  . magnesium oxide  800 mg Oral BID WC  . potassium chloride  40 mEq Oral BID WC  . spironolactone  12.5 mg Oral Daily  . spironolactone  12.5 mg Oral Daily   Continuous Infusions: . furosemide (LASIX) infusion       LOS: 2 days   Time spent= 35 mins    Eron Staat Arsenio Loader, MD [facility] Pager [phone]   If 7PM-7AM, please contact night-coverage [email or web address] Password [email or web address] 03/12/2018, 12:15 PM

## 2018-03-12 NOTE — Progress Notes (Signed)
Orthopedic Tech Progress Note Patient Details:  Kyle Conrad 04-Aug-1965 [id number]  Ortho Devices Type of Ortho Device: Louretta Parma boot Ortho Device/Splint Location: Bilateral unna boots Ortho Device/Splint Interventions: Application   Post Interventions Patient Tolerated: Well Instructions Provided: Care of device   [name] 03/12/2018, 1:49 PM

## 2018-03-12 NOTE — Consult Note (Signed)
[facility] Nurse wound consult note Reason for Consult: Consult requested for penis, bilat toes, and right leg. Wound type: Pt states he slammed the toilet seat down on the underside of his penis last week and "took the skin off an area."  This is currently covered by a urine collection bag which is intact with a good seal; unable to visualize the location. Please consider a urology consult if area continues to be a problem for the patient. Right anterior calf with partial thickness wound; .3X.3X.1cm, pink and moist.  Bilat legs and feet with generalized edema and mod amt yellow drainage weeping. Spaces between toes are red, moist and macerated; appearance is consistent with moisture associated skin damage. Plan: Foam dressing to protect and promote healing to right leg wound.  Tuck gauze between toes to absorb drainage and promote drying and healing. Please re-consult if further assistance is needed.  Thank-you,  Julien Girt MSN, [place], [place], [place], [place]

## 2018-03-12 NOTE — Progress Notes (Signed)
Requesting PRN pain medication. None on MAR. Paged MD

## 2018-03-12 NOTE — Progress Notes (Signed)
Tylenol 650 mg every 6 hrs per verbal order with read back per MD Staci Righter

## 2018-03-12 NOTE — Progress Notes (Signed)
PT Cancellation Note  Patient Details Name: Kyle Conrad MRN: [id number] DOB: 04-09-1966   Cancelled Treatment:    Reason Eval/Treat Not Completed: Other (comment) Ortho tech currently in room applying unna boot. Will follow up as schedule allows.   Leighton Ruff, PT, DPT  Acute Rehabilitation Services  Pager: [phone] Office: [phone]  Rudean Hitt 03/12/2018, 1:35 PM

## 2018-03-13 LAB — COOXEMETRY PANEL
CARBOXYHEMOGLOBIN: 0.9 % (ref 0.5–1.5)
METHEMOGLOBIN: 1.5 % (ref 0.0–1.5)
O2 Saturation: 63.9 %
Total hemoglobin: 11.7 g/dL — ABNORMAL LOW (ref 12.0–16.0)

## 2018-03-13 LAB — BASIC METABOLIC PANEL
ANION GAP: 7 (ref 5–15)
BUN: 15 mg/dL (ref 6–20)
CHLORIDE: 104 mmol/L (ref 98–111)
CO2: 27 mmol/L (ref 22–32)
CREATININE: 1.02 mg/dL (ref 0.61–1.24)
Calcium: 8.2 mg/dL — ABNORMAL LOW (ref 8.9–10.3)
GFR calc Af Amer: >60 mL/min (ref >60)
GLUCOSE: 93 mg/dL (ref 70–99)
POTASSIUM: 3.7 mmol/L (ref 3.5–5.1)
Sodium: 138 mmol/L (ref 135–145)

## 2018-03-13 LAB — MAGNESIUM: MAGNESIUM: 1.3 mg/dL — AB (ref 1.7–2.4)

## 2018-03-13 LAB — TSH: TSH: 2.229 u[IU]/mL (ref 0.350–4.500)

## 2018-03-13 MED ORDER — MAGNESIUM SULFATE 4 GM/100ML IV SOLN
4.0000 g | Freq: Once | INTRAVENOUS | Status: AC
  Administered 2018-03-13: 4 g via INTRAVENOUS
  Filled 2018-03-13: qty 100

## 2018-03-13 MED ORDER — POTASSIUM CHLORIDE CRYS ER 20 MEQ PO TBCR
40.0000 meq | EXTENDED_RELEASE_TABLET | Freq: Once | ORAL | Status: AC
  Administered 2018-03-13: 40 meq via ORAL
  Filled 2018-03-13: qty 2

## 2018-03-13 MED ORDER — MAGNESIUM OXIDE 400 (241.3 MG) MG PO TABS
800.0000 mg | ORAL_TABLET | Freq: Three times a day (TID) | ORAL | Status: AC
  Administered 2018-03-13 (×3): 800 mg via ORAL
  Filled 2018-03-13 (×3): qty 2

## 2018-03-13 MED ORDER — SPIRONOLACTONE 25 MG PO TABS
25.0000 mg | ORAL_TABLET | Freq: Every day | ORAL | Status: DC
  Administered 2018-03-13 – 2018-03-17 (×5): 25 mg via ORAL
  Filled 2018-03-13 (×6): qty 1

## 2018-03-13 MED ORDER — LOSARTAN POTASSIUM 25 MG PO TABS
12.5000 mg | ORAL_TABLET | Freq: Every day | ORAL | Status: DC
  Administered 2018-03-13: 12.5 mg via ORAL
  Filled 2018-03-13: qty 1

## 2018-03-13 NOTE — Progress Notes (Signed)
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  Patient stated he stopped taking his medication several months ago.  He used to follow with Dr. Aundra Dubin, last seen by them 2 years ago.  He was admitted as he was found to be in severe fluid overload and CHF exacerbation.  Cardiology was consulted and patient was started on aggressive diuretics-Lasix drip.  PICC line was placed on 03/12/2018.  Spironolactone, hydralazine and Imdur were started.   Assessment & Plan:   Principal Problem:   Acute hypoxemic respiratory failure (HCC) Active Problems:   Chronic anemia   Hypoalbuminemia   HTN (hypertension)   Acute exacerbation of CHF (congestive heart failure) (HCC)   Chest pain   Hyperbilirubinemia   Acute hypoxic respiratory distress with hypoxia, slowly improving Acute exacerbation of combined systolic and diastolic congestive heart failure, ejection fraction 22%, grade 1 diastolic dysfunction, class IV Generalized anasarca, persist - Status post PICC line placement 10/28.  Currently on Lasix drip - Hydralazine, Imdur and spironolactone added. -Daily input and output, fluid restriction 1200 cc. Albumin 2.0 -Condom catheter in place to get accurate input and output - Echocardiogram -EF 20-25% with severe LV/RV dilatation, severe MR and TR. - Wean off oxygen as deemed necessary -Heart failure team following  Elevated troponin, resolved -Patient is chest pain-free.  Elevated total bilirubin - Unknown etiology, possible from congestion.   -Right upper quadrant ultrasound-moderate ascites, sludging and cholelithiasis with mildly thickened gallbladder wall.  Unable to rule out acute  cholecystitis. - HIDA scan shows some gallbladder dysfunction but no evidence of obstruction.  Hold off on further surgical evaluation at this time as he needs to be aggressively diuresed and stabilized from cardiac standpoint.  Medication noncompliance - Counseled on importance of remain medically compliant.  DVT prophylaxis: Lovenox Code Status: Full code Family Communication: None Disposition Plan: Patient needs to be aggressively diuresed.  Should be in the hospital for at least 3-4 days if not more.  Consultants:   Cardiology  Procedures:   None  Antimicrobials:   None   Subjective: Still remains exertionally short of breath.  But feels slightly better at rest  Review of Systems Otherwise negative except as per HPI, including: General = no fevers, chills, dizziness, malaise, fatigue HEENT/EYES = negative for pain, redness, loss of vision, double vision, blurred vision, loss of hearing, sore throat, hoarseness, dysphagia Cardiovascular= negative for chest pain, palpitation, murmurs, lower extremity swelling Respiratory/lungs= negative for cough, hemoptysis, wheezing, mucus production Gastrointestinal= negative for nausea, vomiting,, abdominal pain, melena, hematemesis Genitourinary= negative for Dysuria, Hematuria, Change in Urinary Frequency MSK = Negative for arthralgia, myalgias, Back Pain, Joint swelling  Neurology= Negative for headache, seizures, numbness, tingling  Psychiatry= Negative for anxiety, depression, suicidal and homocidal ideation Allergy/Immunology= Medication/Food allergy as listed  Skin= Negative for Rash, lesions, ulcers, itching     Objective: Vitals:   03/12/18 2152 03/13/18 0542 03/13/18 0627 03/13/18 1032  BP: 118/65 132/87  127/78  Pulse:  80  92  Resp:  18  18  Temp:  (!) 97.5 F (36.4 C)  98.2 F (36.8 C)  TempSrc:  Oral  Oral  SpO2:  99%  100%  Weight:   107.6 kg   Height:  Intake/Output Summary (Last 24 hours) at  03/13/2018 1225 Last data filed at 03/13/2018 1202 Gross per 24 hour  Intake 834.23 ml  Output 5800 ml  Net -4965.77 ml   Filed Weights   03/11/18 1515 03/12/18 0439 03/13/18 0627  Weight: 111.3 kg 111.1 kg 107.6 kg    Examination: Constitutional: NAD, calm, comfortable, generalized anasarca with bilateral upper and lower extremity swelling Eyes: PERRL, lids and conjunctivae normal ENMT: Mucous membranes are moist. Posterior pharynx clear of any exudate or lesions.Normal dentition. 14+ JVD Neck: normal, supple, no masses, no thyromegaly Respiratory: Bilateral diminished breath sounds Cardiovascular: Regular rate and rhythm, no murmurs / rubs / gallops.  3+ bilateral upper and lower extremity extremity pitting edema 2+ pedal pulses. No carotid bruits.  Abdomen: no tenderness, no masses palpated. No hepatosplenomegaly. Bowel sounds positive.  Musculoskeletal: no clubbing / cyanosis. No joint deformity upper and lower extremities. Good ROM, no contractures. Normal muscle tone.  Skin: no rashes, lesions, ulcers. No induration Neurologic: CN 2-12 grossly intact. Sensation intact, DTR normal. Strength 5/5 in all 4.  Psychiatric: Normal judgment and insight. Alert and oriented x 3. Normal mood.     Data Reviewed:   CBC: Recent Labs  Lab 03/10/18 2025 03/10/18 2038  WBC 4.7  --   NEUTROABS 3.8  --   HGB 12.3* 12.6*  HCT 37.9* 37.0*  MCV 93.3  --   PLT 197  --    Basic Metabolic Panel: Recent Labs  Lab 03/10/18 2025 03/10/18 2038 03/11/18 0114 03/12/18 0512 03/13/18 0345  NA 140 145  --  139 138  K 3.6 3.3*  --  3.4* 3.7  CL 110 110  --  107 104  CO2 20*  --   --  23 27  GLUCOSE 112* 101*  --  105* 93  BUN 13 14  --  13 15  CREATININE 1.06 0.70  --  1.02 1.02  CALCIUM 7.7*  --   --  8.2* 8.2*  MG  --   --  1.5* 1.5* 1.3*   GFR: Estimated Creatinine Clearance: 104.9 mL/min (by C-G formula based on SCr of 1.02 mg/dL). Liver Function Tests: Recent Labs  Lab  03/10/18 2025  AST 29  ALT 36  ALKPHOS 99  BILITOT 4.9*  PROT 6.0*  ALBUMIN 2.0*   No results for input(s): LIPASE, AMYLASE in the last 168 hours. No results for input(s): AMMONIA in the last 168 hours. Coagulation Profile: Recent Labs  Lab 03/11/18 0602  INR 2.19   Cardiac Enzymes: Recent Labs  Lab 03/11/18 0114 03/11/18 0602  TROPONINI <0.03 0.04*   BNP (last 3 results) No results for input(s): PROBNP in the last 8760 hours. HbA1C: No results for input(s): HGBA1C in the last 72 hours. CBG: No results for input(s): GLUCAP in the last 168 hours. Lipid Profile: No results for input(s): CHOL, HDL, LDLCALC, TRIG, CHOLHDL, LDLDIRECT in the last 72 hours. Thyroid Function Tests: No results for input(s): TSH, T4TOTAL, FREET4, T3FREE, THYROIDAB in the last 72 hours. Anemia Panel: No results for input(s): VITAMINB12, FOLATE, FERRITIN, TIBC, IRON, RETICCTPCT in the last 72 hours. Sepsis Labs: Recent Labs  Lab 03/11/18 0114  PROCALCITON 0.19    No results found for this or any previous visit (from the past 240 hour(s)).       Radiology Studies: Nm Hepato W/eject Fract  Result Date: 03/12/2018 CLINICAL DATA:  Gallstones and sludge. EXAM: NUCLEAR MEDICINE HEPATOBILIARY IMAGING WITH GALLBLADDER EF TECHNIQUE: Sequential images of the abdomen were  obtained out to 60 minutes following intravenous administration of radiopharmaceutical. After oral ingestion of Ensure, gallbladder ejection fraction was determined. At 60 min, normal ejection fraction is greater than 33%. RADIOPHARMACEUTICALS:  5 mCi Tc-60m  Choletec IV COMPARISON:  Right upper quadrant abdominal ultrasound of March 11, 2018 FINDINGS: Prompt uptake and biliary excretion of activity by the liver is seen. Gallbladder activity is visualized, consistent with patency of cystic duct. Biliary activity passes into small bowel, consistent with patent common bile duct. Calculated gallbladder ejection fraction is 19%%. (Normal  gallbladder ejection fraction with Ensure is greater than 33%.) IMPRESSION: Findings compatible with gallbladder dysfunction. No evidence of cystic duct or common bile duct obstruction. Electronically Signed   By: David  Martinique M.D.   On: 03/12/2018 13:27   [place] Ekg Site Rite  Result Date: 03/12/2018 If Site Rite image not attached, placement could not be confirmed due to current cardiac rhythm.       Scheduled Meds: . enoxaparin (LOVENOX) injection  40 mg Subcutaneous Daily  . hydrALAZINE  12.5 mg Oral Q8H  . isosorbide mononitrate  30 mg Oral Daily  . losartan  12.5 mg Oral Daily  . magnesium oxide  800 mg Oral TID WC  . sodium chloride flush  10-40 mL Intracatheter Q12H  . spironolactone  25 mg Oral Daily   Continuous Infusions: . furosemide (LASIX) infusion 15 mg/hr (03/12/18 2206)  . magnesium sulfate 1 - 4 g bolus IVPB 4 g (03/13/18 1159)     LOS: 3 days   Time spent= 36 mins    Kyle Conrad Arsenio Loader, MD [facility] Pager [phone]   If 7PM-7AM, please contact night-coverage [email or web address] Password [email or web address] 03/13/2018, 12:25 PM

## 2018-03-13 NOTE — Evaluation (Signed)
Physical Therapy Evaluation Patient Details Name: Kyle Conrad MRN: [id number] DOB: [birth year] Today's Date: 03/13/2018   History of Present Illness  Pt is a 52 y.o. male admitted 03/10/18 with SOB and edema; worked up for CHF, found to have massive volume overload. CXR with opacity RLL concerning for pneumonia. PMH includes CHF, HTN, cirrhosis, OSA.    Clinical Impression  Pt presents with an overall decrease in functional mobility secondary to above. PTA, pt indep and lives alone; reports worsening SOB limiting amb distance at home. Today, pt required significant increased time and effort for all mobility secondary to c/o LE pain and swelling; modA for bed mobility, only able to take a few steps at EOB with RW. Encouraged BUE/BLE AROM and elevation. Unsure if pt's mobility will progress quickly enough for safe return home alone. Currently recommend SNF-level therapies to maximize functional mobility and independence.   SpO2 97% on RA during session    Follow Up Recommendations SNF;Supervision for mobility/OOB(pending progression)    Equipment Recommendations  Rolling walker with 5" wheels;3in1 (PT)    Recommendations for Other Services OT consult     Precautions / Restrictions Precautions Precautions: Fall Precaution Comments: Bilateral unna boots Restrictions Weight Bearing Restrictions: No      Mobility  Bed Mobility Overal bed mobility: Needs Assistance Bed Mobility: Supine to Sit     Supine to sit: Mod assist;HOB elevated     General bed mobility comments: Took >10 minutes moving to EOB, eventually requiring modA for UE support to assist trunk elevation and hips to EOB. Left sitting EOB to eat breakfast  Transfers Overall transfer level: Needs assistance Equipment used: Rolling walker (2 wheeled) Transfers: Sit to/from Stand Sit to Stand: Mod assist;From elevated surface         General transfer comment: Significant increased time and effort, cues for all  aspects of set up as pt attempting to stand scooted far back and knees extended out in front. ModA to assist trunk elevation; limited by c/o foot pain, unable to achieve fully upright posture with c/o hip pain  Ambulation/Gait Ambulation/Gait assistance: Min guard Gait Distance (Feet): 3 Feet Assistive device: Rolling walker (2 wheeled) Gait Pattern/deviations: Step-to pattern;Trunk flexed;Antalgic Gait velocity: Decreased Gait velocity interpretation: <1.31 ft/sec, indicative of household ambulator General Gait Details: Slow, antalgic side steps towards HOB; min guard for balance. Heavy reliance on UE support to offload c/o BLE pain. SpO2 97% on RA; DOE 2/4. Further amb limited by pain/fatigue  Stairs            Wheelchair Mobility    Modified Rankin (Stroke Patients Only)       Balance Overall balance assessment: Needs assistance   Sitting balance-[name]: Fair       Standing balance-[name]: Poor Standing balance comment: Heavy reliance on UE support                             Pertinent Vitals/Pain Pain Assessment: 0-10 Pain Score: 5  Pain Location: Bilateral legs, back Pain Descriptors / Indicators: Sore;Grimacing Pain Intervention(s): Monitored during session;Limited activity within patient's tolerance    Home Living Family/patient expects to be discharged to:: Private residence Living Arrangements: Alone Available Help at Discharge: Family;Friend(s);Available PRN/intermittently Type of Home: Apartment Home Access: Level entry     Home Layout: One level Home Equipment: None Additional Comments: Toilet seat broken    Prior Function Level of Independence: Independent  Comments: Drives     Hand Dominance        Extremity/Trunk Assessment   Upper Extremity Assessment Upper Extremity Assessment: RUE deficits/detail;LUE deficits/detail RUE Deficits / Details: BUE edema (R>L); limited strength/ROM secondary to this,  although able to flex fingers, wrist, elbow RUE: Unable to fully assess due to pain RUE Coordination: decreased fine motor;decreased gross motor LUE Deficits / Details: BUE edema (R>L); limited strength/ROM secondary to this, although able to flex fingers, wrist, elbow LUE: Unable to fully assess due to pain LUE Coordination: decreased gross motor;decreased fine motor    Lower Extremity Assessment Lower Extremity Assessment: RLE deficits/detail;LLE deficits/detail RLE Deficits / Details: Functionally at least 3/5; edema, unna boots RLE: Unable to fully assess due to pain RLE Coordination: decreased fine motor;decreased gross motor LLE Deficits / Details: Functionally at least 3/5; edema, unna boots LLE: Unable to fully assess due to pain LLE Coordination: decreased fine motor;decreased gross motor       Communication   Communication: No difficulties  Cognition Arousal/Alertness: Awake/alert Behavior During Therapy: WFL for tasks assessed/performed Overall Cognitive Status: Impaired/Different from baseline Area of Impairment: Safety/judgement;Problem solving                         Safety/Judgement: Decreased awareness of deficits;Decreased awareness of safety   Problem Solving: Slow processing;Requires verbal cues General Comments: Unsure if true slowed processing, or pt just slow to wake up this morning      General Comments General comments (skin integrity, edema, etc.): SpO2 97% on RA throughout session. Removed LUE "restricted extremity" bracelet secondary to UE edema (NT notified to replace with looser fitting bracelet). Educ on BUE ROM and elevation for edema reduction    Exercises     Assessment/Plan    PT Assessment Patient needs continued PT services  PT Problem List Decreased strength;Decreased range of motion;Decreased activity tolerance;Decreased balance;Decreased mobility;Decreased knowledge of use of DME;Decreased safety awareness;Pain       PT  Treatment Interventions DME instruction;Gait training;Stair training;Functional mobility training;Therapeutic activities;Therapeutic exercise;Balance training;Patient/family education    PT Goals (Current goals can be found in the Care Plan section)  Acute Rehab PT Goals Patient Stated Goal: Less pain PT Goal Formulation: With patient Time For Goal Achievement: 03/27/18 Potential to Achieve Goals: Good    Frequency Min 3X/week   Barriers to discharge Decreased caregiver support      Co-evaluation               AM-PAC PT "6 Clicks" Daily Activity  Outcome Measure Difficulty turning over in bed (including adjusting bedclothes, sheets and blankets)?: Unable Difficulty moving from lying on back to sitting on the side of the bed? : Unable Difficulty sitting down on and standing up from a chair with arms (e.g., wheelchair, bedside commode, etc,.)?: Unable Help needed moving to and from a bed to chair (including a wheelchair)?: A Little Help needed walking in hospital room?: A Little Help needed climbing 3-5 steps with a railing? : A Lot 6 Click Score: 11    End of Session Equipment Utilized During Treatment: Gait belt Activity Tolerance: Patient limited by fatigue;Patient limited by pain Patient left: in bed;with call bell/phone within reach;with bed alarm set(seated EOB eating breakfast) Nurse Communication: Mobility status PT Visit Diagnosis: Other abnormalities of gait and mobility (R26.89);Muscle weakness (generalized) (M62.81);Pain Pain - Right/Left: (Bilatearl) Pain - part of body: Leg    Time: 8338-2505 PT Time Calculation (min) (ACUTE ONLY): 32 min   Charges:  PT Evaluation $PT Eval Moderate Complexity: 1 Mod PT Treatments $Therapeutic Activity: 8-22 mins       Mabeline Caras, PT, DPT Acute Rehabilitation Services  Pager [phone] Office [phone]  Derry Lory 03/13/2018, 8:50 AM

## 2018-03-13 NOTE — Progress Notes (Addendum)
Advanced Heart Failure Rounding Note  PCP-Cardiologist: No primary care provider on file.   Subjective:    Started on lasix drip @ 15 mg/hr. Great diuresis with I/O -3.9 L. Weight is down 7 lbs.   Creatinine 1.02, Mag 1.3, K 3.7.  Also started on spiro, hydral, and imur yesterday. SBP 110-130s. PICC line in place. Coox pending.   HIDDA scan 03/12/18: Findings compatible with gallbladder dysfunction. No evidence of cystic duct or common bile duct obstruct  Denies CP. Still SOB on exertion. He feels mildly dizzy constantly (unchanged). Still having some abdominal pain.  Objective:   Weight Range: 107.6 kg Body mass index is 32.17 kg/m.   Vital Signs:   Temp:  [97.4 F (36.3 C)-97.7 F (36.5 C)] 97.5 F (36.4 C) (10/29 0542) Pulse Rate:  [80-91] 80 (10/29 0542) Resp:  [18-20] 18 (10/29 0542) BP: (110-132)/(65-87) 132/87 (10/29 0542) SpO2:  [91 %-100 %] 99 % (10/29 0542) Weight:  [107.6 kg] 107.6 kg (10/29 0627) Last BM Date: 03/12/18  Weight change: Filed Weights   03/11/18 1515 03/12/18 0439 03/13/18 0627  Weight: 111.3 kg 111.1 kg 107.6 kg    Intake/Output:   Intake/Output Summary (Last 24 hours) at 03/13/2018 0809 Last data filed at 03/13/2018 0546 Gross per 24 hour  Intake 714.23 ml  Output 4600 ml  Net -3885.77 ml      Physical Exam    General:  Anasarca No resp difficulty HEENT: Normal Neck: Supple. JVP to jaw. Carotids 2+ bilat; no bruits. No lymphadenopathy or thyromegaly appreciated. Cor: PMI nondisplaced. Regular rate & rhythm. No rubs, gallops or murmurs. Lungs: Clear, distant.  Abdomen: Soft, nontender, ++distended. No hepatosplenomegaly. No bruits or masses. Good bowel sounds. Extremities: No cyanosis, clubbing, rash, 2-3+ edema in all extremities. BLE unna boots. LUE PICC line Neuro: Alert & orientedx3, cranial nerves grossly intact. moves all 4 extremities w/o difficulty. Affect pleasant  Telemetry   NSR 80s. Personally reviewed.    EKG    No new tracings.   Labs    CBC Recent Labs    03/10/18 2025 03/10/18 2038  WBC 4.7  --   NEUTROABS 3.8  --   HGB 12.3* 12.6*  HCT 37.9* 37.0*  MCV 93.3  --   PLT 197  --    Basic Metabolic Panel Recent Labs    03/12/18 0512 03/13/18 0345  NA 139 138  K 3.4* 3.7  CL 107 104  CO2 23 27  GLUCOSE 105* 93  BUN 13 15  CREATININE 1.02 1.02  CALCIUM 8.2* 8.2*  MG 1.5* 1.3*   Liver Function Tests Recent Labs    03/10/18 2025  AST 29  ALT 36  ALKPHOS 99  BILITOT 4.9*  PROT 6.0*  ALBUMIN 2.0*   No results for input(s): LIPASE, AMYLASE in the last 72 hours. Cardiac Enzymes Recent Labs    03/11/18 0114 03/11/18 0602  TROPONINI <0.03 0.04*    BNP: BNP (last 3 results) Recent Labs    03/10/18 2026  BNP 2,304.5*    ProBNP (last 3 results) No results for input(s): PROBNP in the last 8760 hours.   D-Dimer No results for input(s): DDIMER in the last 72 hours. Hemoglobin A1C No results for input(s): HGBA1C in the last 72 hours. Fasting Lipid Panel No results for input(s): CHOL, HDL, LDLCALC, TRIG, CHOLHDL, LDLDIRECT in the last 72 hours. Thyroid Function Tests No results for input(s): TSH, T4TOTAL, T3FREE, THYROIDAB in the last 72 hours.  Invalid input(s): FREET3  Other results:   Imaging    Nm Hepato W/eject Fract  Result Date: 03/12/2018 CLINICAL DATA:  Gallstones and sludge. EXAM: NUCLEAR MEDICINE HEPATOBILIARY IMAGING WITH GALLBLADDER EF TECHNIQUE: Sequential images of the abdomen were obtained out to 60 minutes following intravenous administration of radiopharmaceutical. After oral ingestion of Ensure, gallbladder ejection fraction was determined. At 60 min, normal ejection fraction is greater than 33%. RADIOPHARMACEUTICALS:  5 mCi Tc-14m  Choletec IV COMPARISON:  Right upper quadrant abdominal ultrasound of March 11, 2018 FINDINGS: Prompt uptake and biliary excretion of activity by the liver is seen. Gallbladder activity is  visualized, consistent with patency of cystic duct. Biliary activity passes into small bowel, consistent with patent common bile duct. Calculated gallbladder ejection fraction is 19%%. (Normal gallbladder ejection fraction with Ensure is greater than 33%.) IMPRESSION: Findings compatible with gallbladder dysfunction. No evidence of cystic duct or common bile duct obstruction. Electronically Signed   By: David  Martinique M.D.   On: 03/12/2018 13:27   [place] Ekg Site Rite  Result Date: 03/12/2018 If Site Rite image not attached, placement could not be confirmed due to current cardiac rhythm.     Medications:     Scheduled Medications: . enoxaparin (LOVENOX) injection  40 mg Subcutaneous Daily  . hydrALAZINE  12.5 mg Oral Q8H  . isosorbide mononitrate  30 mg Oral Daily  . magnesium oxide  800 mg Oral TID WC  . potassium chloride  40 mEq Oral Once  . sodium chloride flush  10-40 mL Intracatheter Q12H  . spironolactone  12.5 mg Oral Daily     Infusions: . furosemide (LASIX) infusion 15 mg/hr (03/12/18 2206)     PRN Medications:  acetaminophen, sodium chloride flush    Patient Profile   Mr Christie is a 52 year old with a history of NICM,  Angioedema with ACE, anemia, HTN, cirrhosis,  OSA, and chronic systolic heart failure.   Admitted with massive anasarca and A/C systolic heart failure.    Assessment/Plan   1. A/C Systolic Heart Failure, NICM  - Remains massive volume overload, but good response to lasix drip.  Check TSH.  - Continue lasix drip 15 mg per hour.  - PICC now in place. Coox pending.  - No bb for now.  - Increase spiro to 25 mg daily - Continue hydralazine 12.5 mg three times a day and 30 mg imdur  - Angioedema with ACE.   2. Abdominal Pain  - HIDDA scan 03/12/18: Findings compatible with gallbladder dysfunction. No evidence of cystic duct or common bile duct obstruction. - Afebrile. No WBC today, but has been normal - Maybe realted to low output.  -  Still having some abdominal pain. Coox pending as above.   3. CXR with questionable pneumonia - Not on antibiotics. Afebrile.   4. Cirrhosis - Cirrhotic appearance on abdominal US. 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 Has moderate ascites.   5. Noncompliance  - He will need close follow up. Need to identify barriers.  - He has trouble paying for medications. Will need to provide HF medications at DC.    Medication concerns reviewed with patient and pharmacy team. Barriers identified: Yes he will need HF medications.   Length of Stay: 2  Darrick Grinder, NP  03/12/2018, 9:17 AM  Advanced Heart Failure Team Pager [phone] (M-F; 7a - 4p)  Please contact [facility] for night-coverage after hours (4p -7a ) and weekends on [email or web address]  Length of Stay: [place], NP  03/13/2018,  8:09 AM  Advanced Heart Failure Team Pager [phone] (M-F; 7a - 4p)  Please contact [facility] for night-coverage after hours (4p -7a ) and weekends on [email or web address]  Patient seen with NP, agree with the above note.    Patient diuresed well yesterday, weight down 7 lbs.  Creatinine stable.  BP stable.    On exam, he has anasarca.  JVP 16+ cm.  Heart regular S1S2 with 2/6 HSM LLSB/apex.   1. Acute on chronic systolic CHF: History of nonischemic cardiomyopathy, prior echo in 2017 with EF up to 40-45%. Cardiac MRI in 3/17 with no LGE, EF 40%. No ETOH intake, HIV negative. Quit taking all meds x 2 years. Echo this admission shows EF 20-25% with severe LV dilation, severe RV dilation with severe systolic dysfunction, severe MR and severe TR (likely functional). He remains markedly volume overloaded with anasarca.  BP is stable.  - Continue Lasix gtt at 15 mg/hr.  - Continue hydralazine 12.5 tid and Imdur 30 daily.   - Increase spironolactone to 25 mg daily.  - Replace magnesium.  - No beta blocker yet with marked volume overload.  - No ACEI or ARNI with angioedema with lisinopril.  Ok to add low  dose losartan 12.5 mg daily today, risk of angioedema on ARB with angioedema on ACEI is low.  - Send off co-ox today off PICC.   - Narrow QRS, not candidate for CRT.  2. Abdominal pain: Abdominal US with Sludge/cholelithiasis in GB, cannot rule out acute cholecystitis but wall thickening could be due to ascites. HIDA scan with no cystic duct or CBD obstruction, but decreased gallbladder ejection fraction.  - Per primary service.  3. Cirrhosis: Liver has cirrhotic appearance on abdominal US.  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  He has moderate ascites.  4. Anasarca: Albumin low at 2.  May be nutritional, but would get 24 hour urine protein collection to assess for nephrotic syndrome.   Loralie Champagne 03/13/2018 8:45 AM

## 2018-03-14 LAB — BASIC METABOLIC PANEL
Anion gap: 7 (ref 5–15)
BUN: 12 mg/dL (ref 6–20)
CHLORIDE: 99 mmol/L (ref 98–111)
CO2: 29 mmol/L (ref 22–32)
CREATININE: 0.93 mg/dL (ref 0.61–1.24)
Calcium: 8.1 mg/dL — ABNORMAL LOW (ref 8.9–10.3)
GFR calc Af Amer: >60 mL/min (ref >60)
GFR calc non Af Amer: >60 mL/min (ref >60)
GLUCOSE: 97 mg/dL (ref 70–99)
Potassium: 3.6 mmol/L (ref 3.5–5.1)
SODIUM: 135 mmol/L (ref 135–145)

## 2018-03-14 LAB — CREATININE, URINE, 24 HOUR
Collection Interval-UCRE24: 24 hours
Creatinine, Urine: <10 mg/dL
Urine Total Volume-UCRE24: 8000 mL

## 2018-03-14 LAB — BILIRUBIN, FRACTIONATED(TOT/DIR/INDIR)
BILIRUBIN TOTAL: 3.4 mg/dL — AB (ref 0.3–1.2)
Bilirubin, Direct: 1.8 mg/dL — ABNORMAL HIGH (ref 0.0–0.2)
Indirect Bilirubin: 1.6 mg/dL — ABNORMAL HIGH (ref 0.3–0.9)

## 2018-03-14 LAB — PROTEIN, URINE, 24 HOUR
COLLECTION INTERVAL-UPROT: 24 h
Protein, Urine: <6 mg/dL
URINE TOTAL VOLUME-UPROT: 8000 mL

## 2018-03-14 LAB — MAGNESIUM: MAGNESIUM: 1.7 mg/dL (ref 1.7–2.4)

## 2018-03-14 MED ORDER — BISACODYL 5 MG PO TBEC
10.0000 mg | DELAYED_RELEASE_TABLET | Freq: Once | ORAL | Status: AC
  Administered 2018-03-14: 10 mg via ORAL
  Filled 2018-03-14: qty 2

## 2018-03-14 MED ORDER — MAGNESIUM SULFATE 4 GM/100ML IV SOLN
4.0000 g | Freq: Once | INTRAVENOUS | Status: AC
  Administered 2018-03-14: 4 g via INTRAVENOUS
  Filled 2018-03-14: qty 100

## 2018-03-14 MED ORDER — MAGNESIUM OXIDE 400 (241.3 MG) MG PO TABS
800.0000 mg | ORAL_TABLET | Freq: Two times a day (BID) | ORAL | Status: AC
  Administered 2018-03-14 (×2): 800 mg via ORAL
  Filled 2018-03-14 (×2): qty 2

## 2018-03-14 MED ORDER — LOSARTAN POTASSIUM 25 MG PO TABS
25.0000 mg | ORAL_TABLET | Freq: Every day | ORAL | Status: DC
  Administered 2018-03-14 – 2018-03-17 (×4): 25 mg via ORAL
  Filled 2018-03-14 (×5): qty 1

## 2018-03-14 MED ORDER — POTASSIUM CHLORIDE CRYS ER 20 MEQ PO TBCR
40.0000 meq | EXTENDED_RELEASE_TABLET | Freq: Once | ORAL | Status: AC
  Administered 2018-03-14: 40 meq via ORAL
  Filled 2018-03-14: qty 2

## 2018-03-14 NOTE — Progress Notes (Signed)
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  Patient stated he stopped taking his medication several months ago.  He used to follow with Dr. Aundra Dubin, last seen by them 2 years ago.  He was admitted as he was found to be in severe fluid overload and CHF exacerbation.  Cardiology was consulted and patient was started on aggressive diuretics-Lasix drip.  PICC line was placed on 03/12/2018.  Spironolactone, hydralazine, Imdur and Losartan started.    Assessment & Plan:   Principal Problem:   Acute hypoxemic respiratory failure (HCC) Active Problems:   Chronic anemia   Hypoalbuminemia   HTN (hypertension)   Acute exacerbation of CHF (congestive heart failure) (HCC)   Chest pain   Hyperbilirubinemia   Acute hypoxic respiratory distress with hypoxia, slowly improving Acute exacerbation of combined systolic and diastolic congestive heart failure, ejection fraction 06%, grade 1 diastolic dysfunction, class IV Generalized anasarca, persist - Status post PICC line placement 10/28.  Currently continue heparin drip, -6.2 L - Hydralazine, Imdur, Losartan and spironolactone added. -Daily input and output, fluid restriction 1200 cc. Albumin 2.0 -Condom catheter in place to get accurate input and output - Echocardiogram -EF 20-25% with severe LV/RV dilatation, severe MR and TR. - Wean off oxygen as deemed necessary -CHF team following  Elevated troponin, resolved -Currently patient is chest pain-free  Elevated total bilirubin - We will check repeat LFTs again tomorrow morning to see the trend of his bilirubin, also check indirect bilirubin.  Curbside a general surgery and will be  available for formal consultation if it becomes necessary.  Appreciate their input -Right upper quadrant ultrasound-moderate ascites, sludging and cholelithiasis with mildly thickened gallbladder wall.  Difficult to rule out acute cholecystitis - HIDA scan shows some gallbladder dysfunction but no evidence of obstruction.    Hypoalbuminemia -24-hour albumin levels collected to r/o Nephrotic syndrome.   Medication noncompliance - Counseled on importance of remain medically compliant.  DVT prophylaxis: Lovenox Code Status: Full code Family Communication: None Disposition Plan: Patient still continues to require an hospital stay for aggressive diuresis.  He is significantly volume overloaded.  Consultants:   Cardiology  Procedures:   None  Antimicrobials:   None   Subjective: Frustrated this morning that he was not able to get his breakfast properly otherwise no new complaints.  He is able to move to the chair without much of an issue and was able to come off supplemental oxygen.  Review of Systems Otherwise negative except as per HPI, including: General = no fevers, chills, dizziness, malaise, fatigue HEENT/EYES = negative for pain, redness, loss of vision, double vision, blurred vision, loss of hearing, sore throat, hoarseness, dysphagia Cardiovascular= negative for chest pain, palpitation, murmurs, lower extremity swelling Respiratory/lungs= negative for cough, hemoptysis, wheezing, mucus production Gastrointestinal= negative for nausea, vomiting,, abdominal pain, melena, hematemesis Genitourinary= negative for Dysuria, Hematuria, Change in Urinary Frequency MSK = Negative for arthralgia, myalgias, Back Pain, Joint swelling  Neurology= Negative for headache, seizures, numbness, tingling  Psychiatry= Negative for anxiety, depression, suicidal and homocidal ideation Allergy/Immunology= Medication/Food allergy as listed  Skin= Negative for Rash, lesions, ulcers,  itching     Objective: Vitals:   03/13/18 1736 03/13/18 1941 03/14/18 0010 03/14/18 0433  BP: 107/85 123/76  122/81 124/81  Pulse: 79 87 92 83  Resp: 17 20 20 18   Temp: 97.8 F (36.6 C) (!) 97.4 F (36.3 C) (!) 97.4 F (36.3 C) (!) 97.5 F (36.4 C)  TempSrc: Oral Oral Oral Oral  SpO2: 97% 100% 100% 98%  Weight:    104.5 kg  Height:        Intake/Output Summary (Last 24 hours) at 03/14/2018 1210 Last data filed at 03/14/2018 4481 Gross per 24 hour  Intake 1001.61 ml  Output 5700 ml  Net -4698.39 ml   Filed Weights   03/12/18 0439 03/13/18 0627 03/14/18 0433  Weight: 111.1 kg 107.6 kg 104.5 kg    Examination: Constitutional: NAD, calm, comfortable, generalized anasarca Eyes: PERRL, lids and conjunctivae normal ENMT: Mucous membranes are moist. Posterior pharynx clear of any exudate or lesions.Normal dentition.  Neck: normal, supple, no masses, no thyromegaly Respiratory: clear to auscultation bilaterally, no wheezing, no crackles. Normal respiratory effort. No accessory muscle use.  Cardiovascular: Regular rate and rhythm, no murmurs / rubs / gallops.  3+ bilateral lower and upper extremity pitting edema. 2+ pedal pulses. No carotid bruits.  Abdomen: no tenderness, no masses palpated. No hepatosplenomegaly. Bowel sounds positive.  Musculoskeletal: no clubbing / cyanosis. No joint deformity upper and lower extremities. Good ROM, no contractures. Normal muscle tone.  Skin: no rashes, lesions, ulcers. No induration Neurologic: CN 2-12 grossly intact. Sensation intact, DTR normal. Strength 5/5 in all 4.  Psychiatric: Normal judgment and insight. Alert and oriented x 3. Normal mood.     Data Reviewed:   CBC: Recent Labs  Lab 03/10/18 2025 03/10/18 2038  WBC 4.7  --   NEUTROABS 3.8  --   HGB 12.3* 12.6*  HCT 37.9* 37.0*  MCV 93.3  --   PLT 197  --    Basic Metabolic Panel: Recent Labs  Lab 03/10/18 2025 03/10/18 2038 03/11/18 0114 03/12/18 0512  03/13/18 0345 03/14/18 0357  NA 140 145  --  139 138 135  K 3.6 3.3*  --  3.4* 3.7 3.6  CL 110 110  --  107 104 99  CO2 20*  --   --  23 27 29   GLUCOSE 112* 101*  --  105* 93 97  BUN 13 14  --  13 15 12   CREATININE 1.06 0.70  --  1.02 1.02 0.93  CALCIUM 7.7*  --   --  8.2* 8.2* 8.1*  MG  --   --  1.5* 1.5* 1.3* 1.7   GFR: Estimated Creatinine Clearance: 113.5 mL/min (by C-G formula based on SCr of 0.93 mg/dL). Liver Function Tests: Recent Labs  Lab 03/10/18 2025  AST 29  ALT 36  ALKPHOS 99  BILITOT 4.9*  PROT 6.0*  ALBUMIN 2.0*   No results for input(s): LIPASE, AMYLASE in the last 168 hours. No results for input(s): AMMONIA in the last 168 hours. Coagulation Profile: Recent Labs  Lab 03/11/18 0602  INR 2.19   Cardiac Enzymes: Recent Labs  Lab 03/11/18 0114 03/11/18 0602  TROPONINI <0.03 0.04*   BNP (last 3 results) No results for input(s): PROBNP in the last 8760 hours. HbA1C: No results for input(s): HGBA1C in the last 72 hours. CBG: No results for input(s): GLUCAP in the last 168 hours. Lipid Profile: No results for input(s): CHOL, HDL, LDLCALC, TRIG, CHOLHDL, LDLDIRECT in the last 72 hours. Thyroid Function Tests: Recent Labs    03/13/18 1140  TSH 2.229   Anemia Panel: No results for input(s): VITAMINB12, FOLATE,  FERRITIN, TIBC, IRON, RETICCTPCT in the last 72 hours. Sepsis Labs: Recent Labs  Lab 03/11/18 0114  PROCALCITON 0.19    No results found for this or any previous visit (from the past 240 hour(s)).       Radiology Studies: Nm Hepato W/eject Fract  Result Date: 03/12/2018 CLINICAL DATA:  Gallstones and sludge. EXAM: NUCLEAR MEDICINE HEPATOBILIARY IMAGING WITH GALLBLADDER EF TECHNIQUE: Sequential images of the abdomen were obtained out to 60 minutes following intravenous administration of radiopharmaceutical. After oral ingestion of Ensure, gallbladder ejection fraction was determined. At 60 min, normal ejection fraction is greater  than 33%. RADIOPHARMACEUTICALS:  5 mCi Tc-70m  Choletec IV COMPARISON:  Right upper quadrant abdominal ultrasound of March 11, 2018 FINDINGS: Prompt uptake and biliary excretion of activity by the liver is seen. Gallbladder activity is visualized, consistent with patency of cystic duct. Biliary activity passes into small bowel, consistent with patent common bile duct. Calculated gallbladder ejection fraction is 19%%. (Normal gallbladder ejection fraction with Ensure is greater than 33%.) IMPRESSION: Findings compatible with gallbladder dysfunction. No evidence of cystic duct or common bile duct obstruction. Electronically Signed   By: David  Martinique M.D.   On: 03/12/2018 13:27        Scheduled Meds: . enoxaparin (LOVENOX) injection  40 mg Subcutaneous Daily  . hydrALAZINE  12.5 mg Oral Q8H  . isosorbide mononitrate  30 mg Oral Daily  . losartan  25 mg Oral Daily  . magnesium oxide  800 mg Oral BID WC  . sodium chloride flush  10-40 mL Intracatheter Q12H  . spironolactone  25 mg Oral Daily   Continuous Infusions: . furosemide (LASIX) infusion 15 mg/hr (03/14/18 7867)  . magnesium sulfate 1 - 4 g bolus IVPB 4 g (03/14/18 1047)     LOS: 4 days   Time spent= 30 mins    Maycol Hoying Arsenio Loader, MD [facility] Pager [phone]   If 7PM-7AM, please contact night-coverage [email or web address] Password [email or web address] 03/14/2018, 12:10 PM

## 2018-03-14 NOTE — Progress Notes (Signed)
Physical Therapy Treatment Patient Details Name: Kyle Conrad MRN: [id number] DOB: Sep 17, 1965 Today's Date: 03/14/2018    History of Present Illness Pt is a 52 y.o. male admitted 03/10/18 with SOB and edema; worked up for CHF, found to have massive volume overload. CXR with opacity RLL concerning for pneumonia. PMH includes CHF, HTN, cirrhosis, OSA.   PT Comments    Pt currently requiring min-modA+2 for mobility; remains limited by BUE/BLE pain and swelling, easily fatigued with little mobility. Continue to recommend post-acute rehab prior to return home.   Follow Up Recommendations  SNF;Supervision for mobility/OOB     Equipment Recommendations  Rolling walker with 5" wheels;3in1 (PT)    Recommendations for Other Services       Precautions / Restrictions Precautions Precautions: Fall Precaution Comments: Bilateral unna boots, BUE edema Restrictions Weight Bearing Restrictions: No    Mobility  Bed Mobility Overal bed mobility: Needs Assistance Bed Mobility: Supine to Sit     Supine to sit: Min assist;+2 for physical assistance;HOB elevated     General bed mobility comments: assist for LEs over EOB and to raise trunk, pt able to scoot hips to EOB with increased time  Transfers Overall transfer level: Needs assistance Equipment used: Rolling walker (2 wheeled) Transfers: Sit to/from Stand Sit to Stand: Mod assist;+2 physical assistance;From elevated surface         General transfer comment: increased time and effort, cues for hand placement, use of pad to rise and assist to steady  Ambulation/Gait Ambulation/Gait assistance: +2 safety/equipment;Min assist Gait Distance (Feet): 6 Feet Assistive device: Rolling walker (2 wheeled) Gait Pattern/deviations: Step-to pattern;Trunk flexed;Antalgic Gait velocity: Decreased Gait velocity interpretation: <1.31 ft/sec, indicative of household ambulator General Gait Details: Slow, antalgic amb with RW; minA for balance  and RW navigation. Pt very limited by c/o pain, easily fatigued   Stairs             Wheelchair Mobility    Modified Rankin (Stroke Patients Only)       Balance Overall balance assessment: Needs assistance   Sitting balance-[name]: Fair       Standing balance-[name]: Poor Standing balance comment: Heavy reliance on UE support                            Cognition Arousal/Alertness: Awake/alert Behavior During Therapy: WFL for tasks assessed/performed Overall Cognitive Status: No family/caregiver present to determine baseline cognitive functioning Area of Impairment: Problem solving                             Problem Solving: Slow processing;Requires verbal cues        Exercises      General Comments        Pertinent Vitals/Pain Pain Assessment: Faces Faces Pain Scale: Hurts even more Pain Location: generalized Pain Descriptors / Indicators: Sore;Grimacing;Moaning Pain Intervention(s): Monitored during session;Limited activity within patient's tolerance    Home Living Family/patient expects to be discharged to:: Private residence Living Arrangements: Alone Available Help at Discharge: Family;Friend(s);Available PRN/intermittently Type of Home: Apartment Home Access: Level entry   Home Layout: One level Home Equipment: None      Prior Function Level of Independence: Independent      Comments: Drives   PT Goals (current goals can now be found in the care plan section) Acute Rehab PT Goals Patient Stated Goal: to get stronger PT Goal Formulation: With patient Time For  Goal Achievement: 03/27/18 Potential to Achieve Goals: Good Progress towards PT goals: Progressing toward goals    Frequency    Min 3X/week      PT Plan Current plan remains appropriate    Co-evaluation PT/OT/SLP Co-Evaluation/Treatment: Yes Reason for Co-Treatment: For patient/therapist safety;To address functional/ADL transfers PT  goals addressed during session: Mobility/safety with mobility;Balance;Proper use of DME OT goals addressed during session: ADL's and self-care      AM-PAC PT "6 Clicks" Daily Activity  Outcome Measure  Difficulty turning over in bed (including adjusting bedclothes, sheets and blankets)?: Unable Difficulty moving from lying on back to sitting on the side of the bed? : Unable Difficulty sitting down on and standing up from a chair with arms (e.g., wheelchair, bedside commode, etc,.)?: Unable Help needed moving to and from a bed to chair (including a wheelchair)?: A Little Help needed walking in hospital room?: A Little Help needed climbing 3-5 steps with a railing? : A Lot 6 Click Score: 11    End of Session Equipment Utilized During Treatment: Gait belt Activity Tolerance: Patient limited by fatigue;Patient limited by pain Patient left: in chair;with call bell/phone within reach;with chair alarm set Nurse Communication: Mobility status PT Visit Diagnosis: Other abnormalities of gait and mobility (R26.89);Muscle weakness (generalized) (M62.81);Pain Pain - Right/Left: (Bilateral) Pain - part of body: Leg;Arm     Time: 9355-2174 PT Time Calculation (min) (ACUTE ONLY): 28 min  Charges:  $Therapeutic Activity: 8-22 mins                    Mabeline Caras, PT, DPT Acute Rehabilitation Services  Pager [phone] Office [phone]  Derry Lory 03/14/2018, 12:51 PM

## 2018-03-14 NOTE — Progress Notes (Signed)
Second call to the cafeteria regarding pt breakfast tray

## 2018-03-14 NOTE — Progress Notes (Addendum)
Advanced Heart Failure Rounding Note  PCP-Cardiologist: No primary care provider on file.   Subjective:    Remains on lasix drip @ 15 mg/hr. Massive diuresis with I/O -6.3 L. Weight down another 7 lbs (15 lbs total).   Creatinine stable 0.93. K 3.6, mag 1.7. Coox 64%  Started on losartan yesterday. SBP 120s. 24 hour protein in process.  SOB slightly improved.   Objective:   Weight Range: 104.5 kg Body mass index is 31.25 kg/m.   Vital Signs:   Temp:  [97.4 F (36.3 C)-98.2 F (36.8 C)] 97.5 F (36.4 C) (10/30 0433) Pulse Rate:  [79-92] 83 (10/30 0433) Resp:  [17-20] 18 (10/30 0433) BP: (107-127)/(76-85) 124/81 (10/30 0433) SpO2:  [97 %-100 %] 98 % (10/30 0433) Weight:  [104.5 kg] 104.5 kg (10/30 0433) Last BM Date: 03/12/18  Weight change: Filed Weights   03/12/18 0439 03/13/18 0627 03/14/18 0433  Weight: 111.1 kg 107.6 kg 104.5 kg    Intake/Output:   Intake/Output Summary (Last 24 hours) at 03/14/2018 0816 Last data filed at 03/14/2018 0614 Gross per 24 hour  Intake 1305.01 ml  Output 7600 ml  Net -6294.99 ml      Physical Exam    General: Anasarca No resp difficulty. HEENT: Normal Neck: Supple. JVP to jaw. Carotids 2+ bilat; no bruits. No thyromegaly or nodule noted. Cor: PMI nondisplaced. RRR, No M/G/R noted Lungs: CTAB, normal effort. Abdomen: Soft, non-tender, ++distended, no HSM. No bruits or masses. +BS  Extremities: No cyanosis, clubbing, or rash. 2-3+ edema in all extremities. BLE unna boots. LUE PICC   Neuro: Alert & orientedx3, cranial nerves grossly intact. moves all 4 extremities w/o difficulty. Affect pleasant  Telemetry   NSR 70-80s. Personally reviewed.   EKG    No new tracings.   Labs    CBC No results for input(s): WBC, NEUTROABS, HGB, HCT, MCV, PLT in the last 72 hours. Basic Metabolic Panel Recent Labs    03/13/18 0345 03/14/18 0357  NA 138 135  K 3.7 3.6  CL 104 99  CO2 27 29  GLUCOSE 93 97  BUN 15 12    CREATININE 1.02 0.93  CALCIUM 8.2* 8.1*  MG 1.3* 1.7   Liver Function Tests No results for input(s): AST, ALT, ALKPHOS, BILITOT, PROT, ALBUMIN in the last 72 hours. No results for input(s): LIPASE, AMYLASE in the last 72 hours. Cardiac Enzymes No results for input(s): CKTOTAL, CKMB, CKMBINDEX, TROPONINI in the last 72 hours.  BNP: BNP (last 3 results) Recent Labs    03/10/18 2026  BNP 2,304.5*    ProBNP (last 3 results) No results for input(s): PROBNP in the last 8760 hours.   D-Dimer No results for input(s): DDIMER in the last 72 hours. Hemoglobin A1C No results for input(s): HGBA1C in the last 72 hours. Fasting Lipid Panel No results for input(s): CHOL, HDL, LDLCALC, TRIG, CHOLHDL, LDLDIRECT in the last 72 hours. Thyroid Function Tests Recent Labs    03/13/18 1140  TSH 2.229    Other results:   Imaging    No results found.   Medications:     Scheduled Medications: . enoxaparin (LOVENOX) injection  40 mg Subcutaneous Daily  . hydrALAZINE  12.5 mg Oral Q8H  . isosorbide mononitrate  30 mg Oral Daily  . losartan  12.5 mg Oral Daily  . magnesium oxide  800 mg Oral BID WC  . potassium chloride  40 mEq Oral Once  . sodium chloride flush  10-40 mL Intracatheter Q12H  .  spironolactone  25 mg Oral Daily    Infusions: . furosemide (LASIX) infusion 15 mg/hr (03/14/18 0614)    PRN Medications: acetaminophen, sodium chloride flush    Patient Profile   [name] is a 52 year old with a history of NICM,  Angioedema with ACE, anemia, HTN, cirrhosis,  OSA, and chronic systolic heart failure.   Admitted with massive anasarca and A/C systolic heart failure.    Assessment/Plan   1. A/C Systolic Heart Failure, NICM  - Echo 03/11/18: EF 20-25% - Remains massive volume overload, but good response to lasix drip.  - Continue lasix drip 15 mg per hour.  - PICC now in place. Coox 64% - No bb for now.  - Continue spiro 25 mg daily - Continue hydralazine  12.5 mg three times a day and 30 mg imdur  - Increase losartan to 25 mg daily. No ACEi or ARNI with angioedema with ACEi  2. Abdominal Pain  - HIDDA scan 03/12/18: Findings compatible with gallbladder dysfunction. No evidence of cystic duct or common bile duct obstruction. - Afebrile. No WBC today, but has been normal - Coox normal, so not likely related to low output.  - Per primary team.   3. CXR with questionable pneumonia - Not on antibiotics. Afebrile. No change.   4. Cirrhosis - Cirrhotic appearance on abdominal US. 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 Has moderate ascites. No change.   5. Hypomagnesiumia - Mag 1.7 today. Supp.   6. Anasarca - Albumin low at 2. 24 hour urine protein currently being collected to assess for nephrotic syndrome.   7. Noncompliance  - He will need close follow up. Need to identify barriers.  - He has trouble paying for medications. Will need to provide HF medications at DC. No change.   8. Deconditioning - PT recommending SNF  Medication concerns reviewed with patient and pharmacy team. Barriers identified: Yes he will need HF medications.    Length of Stay: [place], NP  03/14/2018, 8:16 AM  Advanced Heart Failure Team Pager [phone] (M-F; 7a - 4p)  Please contact [facility] for night-coverage after hours (4p -7a ) and weekends on [email or web address]  Patient seen with NP, agree with the above note.   Patient diuresed well again yesterday, weight continues to drop.  Creatinine stable.  BP stable.  He complains of diffuse joint pain.  Very limited with PT.   On exam, he still has anasarca. JVP 14 cm. Heart regular S1S2 with 2/6 HSM LLSB/apex.   1. Acute on chronic systolic CHF: History of nonischemic cardiomyopathy, prior echo in 2017 with EF up to 40-45%. Cardiac MRI in 3/17 with no LGE, EF 40%. No ETOH intake, HIV negative. Quit taking all meds x 2 years. Echo this admission shows EF 20-25% with severe LV dilation, severe  RV dilation with severe systolic dysfunction, severe MR and severe TR (likely functional). He is diuresing well but remains volume overloaded.  Creatinine stable. Co-ox 64%.  - Continue Lasix gtt at 15 mg/hr.  - Continue hydralazine 12.5 tid and Imdur 30 daily.  - Continue spironolactone 25 mg daily and increase losartan to 25 mg daily. H/o angioedema with ACEI so will not use ACEI or ARNI.  - Replace magnesium.  - No beta blocker yet with marked volume overload.  - Narrow QRS, not candidate for CRT.  2. Abdominal pain: Abdominal US with Sludge/cholelithiasis in GB, cannot rule out acute cholecystitis but wall thickening could be due to ascites.  HIDA scan with no cystic duct or CBD obstruction, but decreased gallbladder ejection fraction.  - Per primary service.  3. Cirrhosis: Liver has cirrhotic appearance on abdominal US. 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 He has moderate ascites.  4. Anasarca: Albumin low at 2.  May be nutritional, but getting 24 hour urine protein collection to assess for nephrotic syndrome.   Loralie Champagne 03/14/2018 11:23 AM

## 2018-03-14 NOTE — Progress Notes (Signed)
Collected pt 24 hour urine and sent to main lab

## 2018-03-14 NOTE — Evaluation (Signed)
Occupational Therapy Evaluation Patient Details Name: Kyle Conrad MRN: [id number] DOB: Mar 10, 1966 Today's Date: 03/14/2018    History of Present Illness Pt is a 52 y.o. male admitted 03/10/18 with SOB and edema; worked up for CHF, found to have massive volume overload. CXR with opacity RLL concerning for pneumonia. PMH includes CHF, HTN, cirrhosis, OSA.   Clinical Impression   Pt is typically independent. Presents with generalized pain and weakness with moderate to significant UE B UE edema. Pt requiring 2 person assist for OOB mobility. Fatigues quickly with short distance ambulation. Pt requires minimum to total assist for ADL. Educated in edema management techniques and left pt with squeeze ball and B UEs elevated. Pt lives alone. At this point, pt will need post acute rehab prior to return home.     Follow Up Recommendations  SNF;Supervision/Assistance - 24 hour    Equipment Recommendations  3 in 1 bedside commode    Recommendations for Other Services       Precautions / Restrictions Precautions Precautions: Fall Precaution Comments: Bilateral unna boots, B UE edema Restrictions Weight Bearing Restrictions: No      Mobility Bed Mobility Overal bed mobility: Needs Assistance Bed Mobility: Supine to Sit     Supine to sit: +2 for physical assistance;Min assist;HOB elevated     General bed mobility comments: assist for LEs over EOB and to raise trunk, pt able to scoot hips to EOB with increased time  Transfers Overall transfer level: Needs assistance Equipment used: Rolling walker (2 wheeled) Transfers: Sit to/from Stand Sit to Stand: +2 physical assistance;Mod assist;From elevated surface         General transfer comment: increased time and effort, cues for hand placement, use of pad to rise and assist to steady    Balance Overall balance assessment: Needs assistance   Sitting balance-[name]: Fair       Standing balance-[name]: Poor Standing  balance comment: Heavy reliance on UE support                           ADL either performed or assessed with clinical judgement   ADL Overall ADL's : Needs assistance/impaired Eating/Feeding: Set up;Bed level Eating/Feeding Details (indicate cue type and reason): assist to open containers, cut food, self feed with L hand Grooming: Moderate assistance;Sitting   Upper Body Bathing: Maximal assistance;Sitting   Lower Body Bathing: Total assistance;+2 for physical assistance;Sit to/from stand   Upper Body Dressing : Moderate assistance;Sitting   Lower Body Dressing: Total assistance;+2 for physical assistance;Sit to/from stand   Toilet Transfer: +2 for physical assistance;Minimal assistance;Ambulation;RW;BSC   Toileting- Clothing Manipulation and Hygiene: +2 for physical assistance;Total assistance;Sitting/lateral lean       Functional mobility during ADLs: +2 for physical assistance;Minimal assistance;Rolling walker       Vision Patient Visual Report: No change from baseline       Perception     Praxis      Pertinent Vitals/Pain Pain Assessment: Faces Faces Pain Scale: Hurts even more Pain Location: generalized Pain Descriptors / Indicators: Sore;Grimacing;Moaning Pain Intervention(s): Monitored during session;Repositioned     Hand Dominance Right   Extremity/Trunk Assessment Upper Extremity Assessment Upper Extremity Assessment: RUE deficits/detail;LUE deficits/detail RUE Deficits / Details: significant edema and generalized weakness, minimal functional use, elevated on 2 pillow and gave pt squeeze ball RUE Coordination: decreased fine motor;decreased gross motor LUE Deficits / Details: moderate edema and generalized weakness, limited functional use, elevated on 1 pillow and gave  pt squeeze ball LUE Coordination: decreased gross motor;decreased fine motor   Lower Extremity Assessment Lower Extremity Assessment: Defer to PT evaluation   Cervical /  Trunk Assessment Cervical / Trunk Assessment: Other exceptions(weakness, difficulty standing upright)   Communication Communication Communication: No difficulties   Cognition Arousal/Alertness: Awake/alert Behavior During Therapy: WFL for tasks assessed/performed Overall Cognitive Status: Within Functional Limits for tasks assessed                                     General Comments       Exercises     Shoulder Instructions      Home Living Family/patient expects to be discharged to:: Private residence Living Arrangements: Alone Available Help at Discharge: Family;Friend(s);Available PRN/intermittently Type of Home: Apartment Home Access: Level entry     Home Layout: One level     Bathroom Shower/Tub: Teacher, early years/pre: Standard     Home Equipment: None          Prior Functioning/Environment Level of Independence: Independent        Comments: Drives        OT Problem List: Decreased strength;Decreased activity tolerance;Impaired balance (sitting and/or standing);Impaired UE functional use;Increased edema;Decreased knowledge of use of DME or AE;Decreased coordination      OT Treatment/Interventions: Self-care/ADL training;DME and/or AE instruction;Therapeutic activities;Patient/family education;Balance training;Therapeutic exercise    OT Goals(Current goals can be found in the care plan section) Acute Rehab OT Goals Patient Stated Goal: to get stronger OT Goal Formulation: With patient Time For Goal Achievement: 03/28/18 Potential to Achieve Goals: Good ADL Goals Pt Will Perform Eating: with set-up;sitting(with R hand) Pt Will Perform Grooming: with set-up;sitting Pt Will Perform Upper Body Dressing: with min assist;sitting Pt Will Perform Lower Body Dressing: with min assist;with adaptive equipment;sit to/from stand Pt Will Transfer to Toilet: with min guard assist;ambulating;bedside commode Pt Will Perform Toileting -  Clothing Manipulation and hygiene: with min assist;sit to/from stand Pt/caregiver will Perform Home Exercise Program: Increased ROM;Increased strength;Both right and left upper extremity;Independently(AROM) Additional ADL Goal #1: Pt will be knowledgeable in techniques to reduce B UE edema.  OT Frequency: Min 2X/week   Barriers to D/C: Decreased caregiver support          Co-evaluation PT/OT/SLP Co-Evaluation/Treatment: Yes Reason for Co-Treatment: For patient/therapist safety   OT goals addressed during session: ADL's and self-care      AM-PAC PT "6 Clicks" Daily Activity     Outcome Measure Help from another person eating meals?: A Little Help from another person taking care of personal grooming?: A Lot Help from another person toileting, which includes using toliet, bedpan, or urinal?: Total Help from another person bathing (including washing, rinsing, drying)?: A Lot Help from another person to put on and taking off regular upper body clothing?: A Lot Help from another person to put on and taking off regular lower body clothing?: Total 6 Click Score: 11   End of Session Equipment Utilized During Treatment: Gait belt;Rolling walker Nurse Communication: Mobility status  Activity Tolerance: Patient limited by fatigue;Patient limited by pain Patient left: in chair;with call bell/phone within reach;with chair alarm set  OT Visit Diagnosis: Unsteadiness on feet (R26.81);Other abnormalities of gait and mobility (R26.89);Muscle weakness (generalized) (M62.81);Pain                Time: 9983-3825 OT Time Calculation (min): 37 min Charges:  OT General Charges $OT Visit: 1  Visit OT Evaluation $OT Eval Moderate Complexity: [place], OTR/L Acute Rehabilitation Services Pager: [phone] Office: [phone]  Malka So 03/14/2018, 10:46 AM

## 2018-03-14 NOTE — Progress Notes (Signed)
Pt upset for incomplete breakfast  Re ordered tray per pt request Awaiting breakfast tray before administering medications

## 2018-03-15 LAB — COMPREHENSIVE METABOLIC PANEL
ALBUMIN: 1.8 g/dL — AB (ref 3.5–5.0)
ALK PHOS: 114 U/L (ref 38–126)
ALT: 31 U/L (ref 0–44)
AST: 24 U/L (ref 15–41)
Anion gap: 5 (ref 5–15)
BUN: 14 mg/dL (ref 6–20)
CALCIUM: 8.1 mg/dL — AB (ref 8.9–10.3)
CHLORIDE: 95 mmol/L — AB (ref 98–111)
CO2: 33 mmol/L — AB (ref 22–32)
CREATININE: 1 mg/dL (ref 0.61–1.24)
GFR calc Af Amer: >60 mL/min (ref >60)
GFR calc non Af Amer: >60 mL/min (ref >60)
GLUCOSE: 84 mg/dL (ref 70–99)
Potassium: 3.8 mmol/L (ref 3.5–5.1)
SODIUM: 133 mmol/L — AB (ref 135–145)
Total Bilirubin: 3.5 mg/dL — ABNORMAL HIGH (ref 0.3–1.2)
Total Protein: 6.3 g/dL — ABNORMAL LOW (ref 6.5–8.1)

## 2018-03-15 LAB — COOXEMETRY PANEL
Carboxyhemoglobin: 1 % (ref 0.5–1.5)
METHEMOGLOBIN: 1.7 % — AB (ref 0.0–1.5)
O2 Saturation: 68.6 %
Total hemoglobin: 12.1 g/dL (ref 12.0–16.0)

## 2018-03-15 LAB — MAGNESIUM: Magnesium: 1.9 mg/dL (ref 1.7–2.4)

## 2018-03-15 MED ORDER — POTASSIUM CHLORIDE CRYS ER 20 MEQ PO TBCR
40.0000 meq | EXTENDED_RELEASE_TABLET | Freq: Every day | ORAL | Status: DC
  Administered 2018-03-16 – 2018-03-17 (×2): 40 meq via ORAL
  Filled 2018-03-15 (×2): qty 2

## 2018-03-15 MED ORDER — POTASSIUM CHLORIDE CRYS ER 20 MEQ PO TBCR
20.0000 meq | EXTENDED_RELEASE_TABLET | Freq: Once | ORAL | Status: AC
  Administered 2018-03-15: 20 meq via ORAL
  Filled 2018-03-15: qty 1

## 2018-03-15 MED ORDER — MAGNESIUM OXIDE 400 (241.3 MG) MG PO TABS
800.0000 mg | ORAL_TABLET | Freq: Once | ORAL | Status: AC
  Administered 2018-03-15: 800 mg via ORAL
  Filled 2018-03-15: qty 2

## 2018-03-15 MED ORDER — MAGNESIUM SULFATE 2 GM/50ML IV SOLN
2.0000 g | Freq: Once | INTRAVENOUS | Status: AC
  Administered 2018-03-15: 2 g via INTRAVENOUS
  Filled 2018-03-15: qty 50

## 2018-03-15 NOTE — Progress Notes (Signed)
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  Patient stated he stopped taking his medication several months ago.  He used to follow with Dr. Aundra Dubin, last seen by them 2 years ago.  He was admitted as he was found to be in severe fluid overload and CHF exacerbation.  Cardiology was consulted and patient was started on aggressive diuretics-Lasix drip.  PICC line was placed on 03/12/2018.  Spironolactone, hydralazine, Imdur and Losartan started.    Assessment & Plan:   Principal Problem:   Acute hypoxemic respiratory failure (HCC) Active Problems:   Chronic anemia   Hypoalbuminemia   HTN (hypertension)   Acute exacerbation of CHF (congestive heart failure) (HCC)   Chest pain   Hyperbilirubinemia   Acute hypoxic respiratory distress with hypoxia, slowly improving Acute exacerbation of combined systolic and diastolic congestive heart failure, ejection fraction 55%, grade 1 diastolic dysfunction, class IV Generalized anasarca, persist - Status post PICC line placement 10/28.  Continue Lasix drip at this time, patient responding well.  -4.6 L in last 24 hours - Hydralazine, Imdur, Losartan and spironolactone added. -Daily input and output, fluid restriction 1200 cc. Albumin 2.0 -Condom catheter in place to get accurate input and output - Echocardiogram -EF 20-25% with severe LV/RV dilatation, severe MR and TR. - Wean off oxygen as deemed necessary -CHF team is following -PT-recommending skilled nursing facility  Elevated troponin, resolved -Currently patient is chest pain-free  Elevated total bilirubin - Total bilirubin slightly trended down to 3.5.  Direct and indirect  bilirubin elevated but rest of LFTs are normal.  Continue to monitor this. -Right upper quadrant ultrasound-moderate ascites, sludging and cholelithiasis with mildly thickened gallbladder wall.  Difficult to rule out acute cholecystitis - HIDA scan shows some gallbladder dysfunction but no evidence of obstruction.    Hypoalbuminemia -24-hour protein in the urine less than 6 and creatinine less than 10  Medication noncompliance - Counseled on importance of remain medically compliant.  DVT prophylaxis: Lovenox Code Status: Full code Family Communication: None Disposition Plan: Continue hospital stay for aggressive diuresis  Consultants:  Heart failure team following Procedures:   None  Antimicrobials:   None   Subjective: Patient is currently off oxygen this morning.  Feels a little better with diuresis.  Review of Systems Otherwise negative except as per HPI, including: General = no fevers, chills, dizziness, malaise, fatigue HEENT/EYES = negative for pain, redness, loss of vision, double vision, blurred vision, loss of hearing, sore throat, hoarseness, dysphagia Cardiovascular= negative for chest pain, palpitation, murmurs, lower extremity swelling Respiratory/lungs= negative for shortness of breath, cough, hemoptysis, wheezing, mucus production Gastrointestinal= negative for nausea, vomiting,, abdominal pain, melena, hematemesis Genitourinary= negative for Dysuria, Hematuria, Change in Urinary Frequency MSK = Negative for arthralgia, myalgias, Back Pain, Joint swelling  Neurology= Negative for headache, seizures, numbness, tingling  Psychiatry= Negative for anxiety, depression, suicidal and homocidal ideation Allergy/Immunology= Medication/Food allergy as listed  Skin= Negative for Rash, lesions, ulcers, itching  Objective: Vitals:   03/15/18 0312 03/15/18 0437 03/15/18 0748 03/15/18 1009  BP:  113/75 117/75   Pulse:  83 79   Resp:  18 18   Temp:  (!) 97.3 F (36.3  C) 97.6 F (36.4 C)   TempSrc:  Oral  Oral   SpO2:  97% 98%   Weight: 96.6 kg   93.3 kg  Height:        Intake/Output Summary (Last 24 hours) at 03/15/2018 1117 Last data filed at 03/15/2018 1010 Gross per 24 hour  Intake 1502.34 ml  Output 7700 ml  Net -6197.66 ml   Filed Weights   03/14/18 0433 03/15/18 0312 03/15/18 1009  Weight: 104.5 kg 96.6 kg 93.3 kg    Examination: Constitutional: NAD, calm, comfortable, generalized anasarca Eyes: PERRL, lids and conjunctivae normal ENMT: Mucous membranes are moist. Posterior pharynx clear of any exudate or lesions.Normal dentition.  Neck: normal, supple, no masses, no thyromegaly Respiratory: clear to auscultation bilaterally, no wheezing, no crackles. Normal respiratory effort. No accessory muscle use.  Cardiovascular: Regular rate and rhythm, no murmurs / rubs / gallops.  3+ bilateral upper and lower extremity pitting edema-improving. 2+ pedal pulses. No carotid bruits.  Abdomen: no tenderness, no masses palpated. No hepatosplenomegaly. Bowel sounds positive.  Musculoskeletal: no clubbing / cyanosis. No joint deformity upper and lower extremities. Good ROM, no contractures. Normal muscle tone.  Skin: no rashes, lesions, ulcers. No induration Neurologic: CN 2-12 grossly intact. Sensation intact, DTR normal. Strength 5/5 in all 4.  Psychiatric: Normal judgment and insight. Alert and oriented x 3. Normal mood.    Data Reviewed:   CBC: Recent Labs  Lab 03/10/18 2025 03/10/18 2038  WBC 4.7  --   NEUTROABS 3.8  --   HGB 12.3* 12.6*  HCT 37.9* 37.0*  MCV 93.3  --   PLT 197  --    Basic Metabolic Panel: Recent Labs  Lab 03/10/18 2025 03/10/18 2038 03/11/18 0114 03/12/18 0512 03/13/18 0345 03/14/18 0357 03/15/18 0409  NA 140 145  --  139 138 135 133*  K 3.6 3.3*  --  3.4* 3.7 3.6 3.8  CL 110 110  --  107 104 99 95*  CO2 20*  --   --  23 27 29  33*  GLUCOSE 112* 101*  --  105* 93 97 84  BUN 13 14  --  13 15 12 14     CREATININE 1.06 0.70  --  1.02 1.02 0.93 1.00  CALCIUM 7.7*  --   --  8.2* 8.2* 8.1* 8.1*  MG  --   --  1.5* 1.5* 1.3* 1.7 1.9   GFR: Estimated Creatinine Clearance: 100.2 mL/min (by C-G formula based on SCr of 1 mg/dL). Liver Function Tests: Recent Labs  Lab 03/10/18 2025 03/14/18 1752 03/15/18 0409  AST 29  --  24  ALT 36  --  31  ALKPHOS 99  --  114  BILITOT 4.9* 3.4* 3.5*  PROT 6.0*  --  6.3*  ALBUMIN 2.0*  --  1.8*   No results for input(s): LIPASE, AMYLASE in the last 168 hours. No results for input(s): AMMONIA in the last 168 hours. Coagulation Profile: Recent Labs  Lab 03/11/18 0602  INR 2.19   Cardiac Enzymes: Recent Labs  Lab 03/11/18 0114 03/11/18 0602  TROPONINI <0.03 0.04*   BNP (last 3 results) No results for input(s): PROBNP in the last 8760 hours. HbA1C: No results for input(s): HGBA1C in the last 72 hours. CBG: No results for input(s): GLUCAP in the last 168 hours. Lipid Profile: No results for input(s): CHOL, HDL, LDLCALC, TRIG, CHOLHDL, LDLDIRECT in the last 72 hours. Thyroid Function Tests: Recent Labs    03/13/18 1140  TSH 2.229   Anemia Panel: No results for input(s): VITAMINB12, FOLATE, FERRITIN,  TIBC, IRON, RETICCTPCT in the last 72 hours. Sepsis Labs: Recent Labs  Lab 03/11/18 0114  PROCALCITON 0.19    No results found for this or any previous visit (from the past 240 hour(s)).       Radiology Studies: No results found.      Scheduled Meds: . enoxaparin (LOVENOX) injection  40 mg Subcutaneous Daily  . hydrALAZINE  12.5 mg Oral Q8H  . isosorbide mononitrate  30 mg Oral Daily  . losartan  25 mg Oral Daily  . [START ON 03/16/2018] potassium chloride  40 mEq Oral Daily  . sodium chloride flush  10-40 mL Intracatheter Q12H  . spironolactone  25 mg Oral Daily   Continuous Infusions: . furosemide (LASIX) infusion 15 mg/hr (03/15/18 0725)  . magnesium sulfate 1 - 4 g bolus IVPB 2 g (03/15/18 1059)     LOS: 5 days    Time spent= 30 mins    Laiken Sandy Arsenio Loader, MD [facility] Pager [phone]   If 7PM-7AM, please contact night-coverage [email or web address] Password [email or web address] 03/15/2018, 11:17 AM

## 2018-03-15 NOTE — Progress Notes (Addendum)
Physical Therapy Treatment Patient Details Name: Kyle Conrad MRN: [id number] DOB: May 14, 1966 Today's Date: 03/15/2018    History of Present Illness Pt is a 52 y.o. male admitted 03/10/18 with SOB and edema; worked up for CHF, found to have massive volume overload. CXR with opacity RLL concerning for pneumonia. PMH includes CHF, HTN, cirrhosis, OSA.   PT Comments    Pt slowly progressing with mobility. Required modA to stand, min guard for very short ambulation distance using RW, and currently dependent for perianal care (also with bowel incontinence). Poor problem solving and decreased awareness. Continue to recommend SNF-level therapies to maximize functional mobility and independence prior to return home. Pt unsure he will have any family/friend support available.   Follow Up Recommendations  SNF;Supervision for mobility/OOB     Equipment Recommendations  Rolling walker with 5" wheels;3in1 (PT);Wheelchair (measurements PT)(if to return home, will need w/c)    Recommendations for Other Services       Precautions / Restrictions Precautions Precautions: Fall Precaution Comments: Bilateral unna boots, BUE edema Restrictions Weight Bearing Restrictions: No    Mobility  Bed Mobility               General bed mobility comments: Received sitting in recliner  Transfers Overall transfer level: Needs assistance Equipment used: Rolling walker (2 wheeled) Transfers: Sit to/from Stand Sit to Stand: Mod assist         General transfer comment: Significant increased time and effort to scoot to EOB, educ on technique to scoot one hip at a time. Cues for all aspects of preparing to stand/body position. ModA to assist trunk elevation; heavy reliance on UE support to push into standing. Upon standing, pt did not realize he had BM in chair  Ambulation/Gait Ambulation/Gait assistance: Min guard Gait Distance (Feet): 20 Feet Assistive device: Rolling walker (2 wheeled)   Gait  velocity: Decreased Gait velocity interpretation: <1.31 ft/sec, indicative of household ambulator General Gait Details: Slow, antalgic amb with RW and min guard for balance. Prolonged standing for pericare prior to ambulation; easily fatigued. Pt with further bowel incontinence while walking in room   Stairs             Wheelchair Mobility    Modified Rankin (Stroke Patients Only)       Balance Overall balance assessment: Needs assistance   Sitting balance-[name]: Fair       Standing balance-[name]: Poor Standing balance comment: Reliant on UE support for dynamic balance; able to pick up RW when making a turn while static standing                            Cognition Arousal/Alertness: Awake/alert Behavior During Therapy: WFL for tasks assessed/performed Overall Cognitive Status: No family/caregiver present to determine baseline cognitive functioning Area of Impairment: Attention;Safety/judgement;Awareness;Problem solving                   Current Attention Level: Selective     Safety/Judgement: Decreased awareness of deficits;Decreased awareness of safety Awareness: Emergent Problem Solving: Slow processing;Requires verbal cues General Comments: Pt with poor problem solving and awareness. Reports he will drive to car (which is in [employer] parking lot), although he stated his keys were thrown out by EMS and spare car key is currently locked in house. Pt not at all tracking with this PT when attempting to figure out how to resolve this problem, and importance of needing to be able to walk household distances  in order to return home; does not understand why he might need w/c. A lot of "blank stares" this session      Exercises      General Comments General comments (skin integrity, edema, etc.): Very poor awareness throughout session      Pertinent Vitals/Pain Pain Assessment: 0-10 Pain Score: 5  Pain Location: Back, BLEs Pain  Descriptors / Indicators: Sore;Grimacing Pain Intervention(s): Monitored during session;Limited activity within patient's tolerance    Home Living                      Prior Function            PT Goals (current goals can now be found in the care plan section) Acute Rehab PT Goals Patient Stated Goal: to get stronger PT Goal Formulation: With patient Time For Goal Achievement: 03/27/18 Potential to Achieve Goals: Good Progress towards PT goals: Progressing toward goals    Frequency    Min 3X/week      PT Plan Current plan remains appropriate    Co-evaluation              AM-PAC PT "6 Clicks" Daily Activity  Outcome Measure  Difficulty turning over in bed (including adjusting bedclothes, sheets and blankets)?: Unable Difficulty moving from lying on back to sitting on the side of the bed? : Unable Difficulty sitting down on and standing up from a chair with arms (e.g., wheelchair, bedside commode, etc,.)?: Unable Help needed moving to and from a bed to chair (including a wheelchair)?: A Little Help needed walking in hospital room?: A Little Help needed climbing 3-5 steps with a railing? : A Lot 6 Click Score: 11    End of Session Equipment Utilized During Treatment: Gait belt Activity Tolerance: Patient limited by fatigue;Patient limited by pain Patient left: with call bell/phone within reach(seated on [facility]) Nurse Communication: Mobility status(NT notified pt on BSC having BM) PT Visit Diagnosis: Other abnormalities of gait and mobility (R26.89);Muscle weakness (generalized) (M62.81);Pain Pain - part of body: Leg;Arm     Time: 0929-5747 PT Time Calculation (min) (ACUTE ONLY): 40 min  Charges:  $Gait Training: 8-22 mins $Therapeutic Activity: 23-37 mins                    Mabeline Caras, PT, DPT Acute Rehabilitation Services  Pager [phone] Office [place] 03/15/2018, 1:28 PM

## 2018-03-15 NOTE — Clinical Social Work Note (Signed)
Discussed patient in quality collaborative meeting this morning. Surveyor, quantity of social work Manufacturing systems engineer to work with patient as much as possible to prepare for potential need to discharge home. Patient has no insurance.  Kyle Conrad, [place]

## 2018-03-15 NOTE — Progress Notes (Addendum)
Advanced Heart Failure Rounding Note  PCP-Cardiologist: No primary care provider on file.   Subjective:    Remains on lasix drip @ 15 mg/hr. Great diuresis with I/O -4.6 L. Weight inaccurate today (down 18 lbs). Repeat pending.   Creatinine stable 1.0. K 3.8, mag 1.9. Coox 69%. SBP 100-110s.  24 hour urine collection: Urine protein <6, urine creatinine <10  Tbili 3.4, direct 1.8, indirect 1.6  No orthopnea or SOB. Feels weak. Abdominal pain about the same.   Objective:   Weight Range: 96.6 kg Body mass index is 28.88 kg/m.   Vital Signs:   Temp:  [97.3 F (36.3 C)-97.6 F (36.4 C)] 97.3 F (36.3 C) (10/31 0437) Pulse Rate:  [74-84] 83 (10/31 0437) Resp:  [18-19] 18 (10/31 0437) BP: (99-113)/(67-80) 113/75 (10/31 0437) SpO2:  [97 %-100 %] 97 % (10/31 0437) Weight:  [96.6 kg] 96.6 kg (10/31 0312) Last BM Date: 03/10/18  Weight change: Filed Weights   03/13/18 0627 03/14/18 0433 03/15/18 0312  Weight: 107.6 kg 104.5 kg 96.6 kg    Intake/Output:   Intake/Output Summary (Last 24 hours) at 03/15/2018 0740 Last data filed at 03/15/2018 0440 Gross per 24 hour  Intake 1084.04 ml  Output 5700 ml  Net -4615.96 ml      Physical Exam    General: Anasarca. No resp difficulty. Lying in bed HEENT: Normal Neck: Supple. JVP to jaw. Carotids 2+ bilat; no bruits. No thyromegaly or nodule noted. Cor: PMI nondisplaced. RRR, No M/G/R noted Lungs: CTAB, normal effort. Abdomen: Soft, non-tender, ++distended, no HSM. No bruits or masses. +BS  Extremities: No cyanosis, clubbing, or rash. 2+ edema in all extremities. BLE unna boots. LUE PICC.  Neuro: Alert & orientedx3, cranial nerves grossly intact. moves all 4 extremities w/o difficulty. Affect pleasant  Telemetry   NSR 80s. Personally reviewed.   EKG    No new tracings.   Labs    CBC No results for input(s): WBC, NEUTROABS, HGB, HCT, MCV, PLT in the last 72 hours. Basic Metabolic Panel Recent Labs   03/14/18 0357 03/15/18 0409  NA 135 133*  K 3.6 3.8  CL 99 95*  CO2 29 33*  GLUCOSE 97 84  BUN 12 14  CREATININE 0.93 1.00  CALCIUM 8.1* 8.1*  MG 1.7 1.9   Liver Function Tests Recent Labs    03/14/18 1752 03/15/18 0409  AST  --  24  ALT  --  31  ALKPHOS  --  114  BILITOT 3.4* 3.5*  PROT  --  6.3*  ALBUMIN  --  1.8*   No results for input(s): LIPASE, AMYLASE in the last 72 hours. Cardiac Enzymes No results for input(s): CKTOTAL, CKMB, CKMBINDEX, TROPONINI in the last 72 hours.  BNP: BNP (last 3 results) Recent Labs    03/10/18 2026  BNP 2,304.5*    ProBNP (last 3 results) No results for input(s): PROBNP in the last 8760 hours.   D-Dimer No results for input(s): DDIMER in the last 72 hours. Hemoglobin A1C No results for input(s): HGBA1C in the last 72 hours. Fasting Lipid Panel No results for input(s): CHOL, HDL, LDLCALC, TRIG, CHOLHDL, LDLDIRECT in the last 72 hours. Thyroid Function Tests Recent Labs    03/13/18 1140  TSH 2.229    Other results:   Imaging    No results found.   Medications:     Scheduled Medications: . enoxaparin (LOVENOX) injection  40 mg Subcutaneous Daily  . hydrALAZINE  12.5 mg Oral Q8H  .  isosorbide mononitrate  30 mg Oral Daily  . losartan  25 mg Oral Daily  . magnesium oxide  800 mg Oral Once  . potassium chloride  20 mEq Oral Once  . sodium chloride flush  10-40 mL Intracatheter Q12H  . spironolactone  25 mg Oral Daily    Infusions: . furosemide (LASIX) infusion 15 mg/hr (03/15/18 0327)    PRN Medications: acetaminophen, sodium chloride flush    Patient Profile   Kyle Conrad is a 52 year old with a history of NICM,  Angioedema with ACE, anemia, HTN, cirrhosis,  OSA, and chronic systolic heart failure.   Admitted with massive anasarca and A/C systolic heart failure.    Assessment/Plan   1. A/C Systolic Heart Failure, NICM  - Echo 03/11/18: EF 20-25% - Remains massive volume overload, but good  response to lasix drip.  - Continue lasix drip 15 mg per hour.  - PICC now in place. Coox 69% - No bb for now.  - Continue spiro 25 mg daily - Continue hydralazine 12.5 mg three times a day and 30 mg imdur. SBP 100-110s - Continue losartan 25 mg daily. No ACEi or ARNI with angioedema with ACEi - Continue unna boots  2. Abdominal Pain  - HIDDA scan 03/12/18: Findings compatible with gallbladder dysfunction. No evidence of cystic duct or common bile duct obstruction. - Afebrile. No WBC today, but has been normal - Coox normal, so not likely related to low output.  - Tbili 3.5. Indirect 1.6, direct 1.8. Per Primary.   3. CXR with questionable pneumonia - Not on antibiotics. Afebrile. No change.   4. Cirrhosis - Cirrhotic appearance on abdominal US. 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 Has moderate ascites. No change.   5. Hypomagnesiumia - Mag 1.9 today. Supp.   6. Anasarca - Albumin low at 2. 24 hour urine protein completed to assess for nephrotic syndrome. Urine protein <6, urine creatinine <10  7. Noncompliance  - He will need close follow up. Need to identify barriers.  - He has trouble paying for medications. Will need to provide HF medications at DC. No change.   8. Deconditioning - PT recommending SNF  Medication concerns reviewed with patient and pharmacy team. Barriers identified: Yes he will need HF medications.    Length of Stay: [place], NP  03/15/2018, 7:40 AM  Advanced Heart Failure Team Pager [phone] (M-F; [place])  Please contact [facility] for night-coverage after hours (4p -7a ) and weekends on [email or web address]   Patient seen and examined with the above-signed Advanced Practice Provider and/or Housestaff. I personally reviewed laboratory data, imaging studies and relevant notes. I independently examined the patient and formulated the important aspects of the plan. I have edited the note to reflect any of my changes or salient points. I have  personally discussed the plan with the patient and/or family.  Remains on lasix gtt. Continues to diurese well. Co-ox 69% Still volume overloaded with likely another 10-15 of fluid to go. Renal function stable. Supp K and Mg. 24 hour urine without significant proteinuria.  Continue IV lasix.   Glori Bickers, MD  5:21 PM

## 2018-03-16 DIAGNOSIS — E8809 Other disorders of plasma-protein metabolism, not elsewhere classified: Secondary | ICD-10-CM

## 2018-03-16 LAB — COMPREHENSIVE METABOLIC PANEL
ALT: 32 U/L (ref 0–44)
AST: 65 U/L — AB (ref 15–41)
Albumin: 1.8 g/dL — ABNORMAL LOW (ref 3.5–5.0)
Alkaline Phosphatase: 119 U/L (ref 38–126)
Anion gap: 7 (ref 5–15)
BUN: 15 mg/dL (ref 6–20)
CHLORIDE: 90 mmol/L — AB (ref 98–111)
CO2: 32 mmol/L (ref 22–32)
CREATININE: 0.99 mg/dL (ref 0.61–1.24)
Calcium: 8.1 mg/dL — ABNORMAL LOW (ref 8.9–10.3)
GFR calc non Af Amer: >60 mL/min (ref >60)
Glucose, Bld: 95 mg/dL (ref 70–99)
Potassium: 3.6 mmol/L (ref 3.5–5.1)
Sodium: 129 mmol/L — ABNORMAL LOW (ref 135–145)
Total Bilirubin: 3.6 mg/dL — ABNORMAL HIGH (ref 0.3–1.2)
Total Protein: 6.3 g/dL — ABNORMAL LOW (ref 6.5–8.1)

## 2018-03-16 LAB — MAGNESIUM: MAGNESIUM: 2.1 mg/dL (ref 1.7–2.4)

## 2018-03-16 MED ORDER — CARVEDILOL 3.125 MG PO TABS
3.1250 mg | ORAL_TABLET | Freq: Two times a day (BID) | ORAL | Status: DC
  Administered 2018-03-16 – 2018-03-18 (×4): 3.125 mg via ORAL
  Filled 2018-03-16 (×6): qty 1

## 2018-03-16 MED ORDER — POTASSIUM CHLORIDE CRYS ER 20 MEQ PO TBCR
40.0000 meq | EXTENDED_RELEASE_TABLET | Freq: Once | ORAL | Status: AC
  Administered 2018-03-16: 40 meq via ORAL
  Filled 2018-03-16: qty 2

## 2018-03-16 NOTE — Plan of Care (Signed)
  Problem: Clinical Measurements: Goal: Cardiovascular complication will be avoided Outcome: Progressing   Problem: Nutrition: Goal: Adequate nutrition will be maintained Outcome: Progressing   Problem: Activity: Goal: Capacity to carry out activities will improve Outcome: Progressing

## 2018-03-16 NOTE — Progress Notes (Addendum)
Advanced Heart Failure Rounding Note  PCP-Cardiologist: No primary care provider on file.   Subjective:    Remains on lasix drip @ 15 mg/hr. Negative 9.3 liters. Weight down another 15 pounds.   Weight down fron 275>189 pounds.   Feeling better but remains weak. Denies SOB.    Tbili 3.4>3.5 3.  Objective:   Weight Range: 86 kg Body mass index is 25.71 kg/m.   Vital Signs:   Temp:  [97.3 F (36.3 C)-98.7 F (37.1 C)] 97.4 F (36.3 C) (11/01 0404) Pulse Rate:  [78-84] 84 (11/01 0404) Resp:  [18-19] 18 (11/01 0404) BP: (108-133)/(66-94) 124/87 (11/01 0404) SpO2:  [98 %-100 %] 99 % (11/01 0404) Weight:  [86 kg-93.3 kg] 86 kg (11/01 0404) Last BM Date: 03/15/18  Weight change: Filed Weights   03/15/18 0312 03/15/18 1009 03/16/18 0404  Weight: 96.6 kg 93.3 kg 86 kg    Intake/Output:   Intake/Output Summary (Last 24 hours) at 03/16/2018 0750 Last data filed at 03/16/2018 0556 Gross per 24 hour  Intake 818.65 ml  Output 10301 ml  Net -9482.35 ml      Physical Exam    General:   No resp difficulty HEENT: normal Neck: supple. JVP ~10 . Carotids 2+ bilat; no bruits. No lymphadenopathy or thryomegaly appreciated. Cor: PMI nondisplaced. Regular rate & rhythm. No rubs, gallops or murmurs. Lungs: clear Abdomen: soft, nontender, nondistended. No hepatosplenomegaly. No bruits or masses. Good bowel sounds. Extremities: no cyanosis, clubbing, rash, R and LLE unna boots. 1+ edema.  Neuro: alert & orientedx3, cranial nerves grossly intact. moves all 4 extremities w/o difficulty. Affect flat  Telemetry  NSR 80s personally reviewed.   EKG    No new tracings.   Labs    CBC No results for input(s): WBC, NEUTROABS, HGB, HCT, MCV, PLT in the last 72 hours. Basic Metabolic Panel Recent Labs    03/15/18 0409 03/16/18 0411  NA 133* 129*  K 3.8 3.6  CL 95* 90*  CO2 33* 32  GLUCOSE 84 95  BUN 14 15  CREATININE 1.00 0.99  CALCIUM 8.1* 8.1*  MG 1.9 2.1   Liver  Function Tests Recent Labs    03/15/18 0409 03/16/18 0411  AST 24 65*  ALT 31 32  ALKPHOS 114 119  BILITOT 3.5* 3.6*  PROT 6.3* 6.3*  ALBUMIN 1.8* 1.8*   No results for input(s): LIPASE, AMYLASE in the last 72 hours. Cardiac Enzymes No results for input(s): CKTOTAL, CKMB, CKMBINDEX, TROPONINI in the last 72 hours.  BNP: BNP (last 3 results) Recent Labs    03/10/18 2026  BNP 2,304.5*    ProBNP (last 3 results) No results for input(s): PROBNP in the last 8760 hours.   D-Dimer No results for input(s): DDIMER in the last 72 hours. Hemoglobin A1C No results for input(s): HGBA1C in the last 72 hours. Fasting Lipid Panel No results for input(s): CHOL, HDL, LDLCALC, TRIG, CHOLHDL, LDLDIRECT in the last 72 hours. Thyroid Function Tests Recent Labs    03/13/18 1140  TSH 2.229    Other results:   Imaging    No results found.   Medications:     Scheduled Medications: . enoxaparin (LOVENOX) injection  40 mg Subcutaneous Daily  . hydrALAZINE  12.5 mg Oral Q8H  . isosorbide mononitrate  30 mg Oral Daily  . losartan  25 mg Oral Daily  . potassium chloride  40 mEq Oral Daily  . sodium chloride flush  10-40 mL Intracatheter Q12H  . spironolactone  25 mg Oral Daily    Infusions: . furosemide (LASIX) infusion 15 mg/hr (03/15/18 2120)    PRN Medications: acetaminophen, sodium chloride flush    Patient Profile   [name] is a 52 year old with a history of NICM,  Angioedema with ACE, anemia, HTN, cirrhosis,  OSA, and chronic systolic heart failure.   Admitted with massive anasarca and A/C systolic heart failure.    Assessment/Plan   1. A/C Systolic Heart Failure, NICM  - Echo 03/11/18: EF 20-25% - Remains massive volume overload, but good response to lasix drip.  - Needs another day of lasix drip. Will cut back to lasix drip to 10 mg per hour.  Anticipate switching to torsemide 40 mg daily.  CO-OX remains stable.  - Add coreg 3.125 mg twice a day.    - Continue spiro 25 mg daily - Continue hydralazine 12.5 mg three times a day and 30 mg imdur. SBP 100-110s - Continue losartan 25 mg daily. No ACEi or ARNI with angioedema with ACEi - Continue unna boots  2. Abdominal Pain  - HIDDA scan 03/12/18: Findings compatible with gallbladder dysfunction. No evidence of cystic duct or common bile duct obstruction. - Afebrile. No WBC today, but has been normal - Coox normal, so not likely related to low output.  - Tbili 3.5>3.6 .   3. CXR with questionable pneumonia - Not on antibiotics. Afebrile. No change.   4. Cirrhosis - Cirrhotic appearance on abdominal US. 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 Has moderate ascites. No change.   5. Hypomagnesiumia - Check mag in am.    6. Anasarca - Albumin low at 2. 24 hour urine protein completed to assess for nephrotic syndrome. Urine protein <6, urine creatinine <10  7. Noncompliance  - He will need close follow up. Need to identify barriers.  - He has trouble paying for medications. Will need to provide HF medications at DC. No change.   8. Deconditioning - PT recommending SNF but he has not insurance.   Medication concerns reviewed with patient and pharmacy team. Barriers identified: Yes he will need HF medications.   He will need to be given medications prior to discharge. He will need MATCH program if discharged. Consult care management consult.   Length of Stay: [place], NP  03/16/2018, 7:50 AM  Advanced Heart Failure Team Pager [phone] (M-F; 7a - 4p)  Please contact [facility] for night-coverage after hours (4p -7a ) and weekends on [email or web address]  Patient seen with NP, agree with the above note.   He continues to diurese very well.  Still has some volume but getting close.  BP stable today. Walking but weak.  - Decrease Lasix to 10 mg/hr.  Tomorrow, likely transition to torsemide 40 mg daily . - Replace K.  - Add Coreg 3.125 mg bid.   He will need SNF at discharge.    Loralie Champagne 03/16/2018 8:07 AM

## 2018-03-16 NOTE — Progress Notes (Signed)
MD  Returned call and will observe

## 2018-03-16 NOTE — Progress Notes (Signed)
MD notified of pt subnormal oral temp. Oral temp taken 5x with different devices and all read subnormal. Patient is warm to touch (wnl) , no coughing, asymptomatic.  Explained importance ot verifying temp to patient but he declines further oral temps and adamantly refuses rectal temp.  Patient has been awake, alert, took meals well.

## 2018-03-16 NOTE — Progress Notes (Signed)
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  Patient stated he stopped taking his medication several months ago.  He used to follow with Dr. Aundra Dubin, last seen by them 2 years ago.  He was admitted as he was found to be in severe fluid overload and CHF exacerbation.  Cardiology was consulted and patient was started on aggressive diuretics-Lasix drip.  PICC line was placed on 03/12/2018.  Spironolactone, hydralazine, Imdur and Losartan started.   Reason for Visit: Acute systolic CHF  Consultants: Cardiology  Procedures:   Transthoracic echocardiogram Study Conclusions  - Left ventricle: The cavity size was severely dilated. Wall   thickness was increased in a pattern of mild LVH. Systolic   function was severely reduced. The estimated ejection fraction   was in the range of 20% to 25%. - Aortic valve: There was mild regurgitation. - Mitral valve: MR is eccentric and severe It is more posterior   into LA - Left atrium: The atrium was moderately to severely dilated. - Right ventricle: The cavity size was severely dilated. Systolic   function was severely reduced. - Right atrium: The atrium was severely dilated. - Tricuspid valve: TV leaflets do not coapt TR is severe . - Pericardium, extracardiac: A small pericardial effusion was   identified. - Impressions: COmpared to report of echo from 2017, LVEF is worse.   TR and MR are now severe RVEF was not reported at that time, now   it is severely depressed.  Impressions:  - COmpared to report of echo from 2017, LVEF is worse. TR and MR   are now severe RVEF was not reported at that time, now  it is   severely depressed.  Antibiotics: None  Subjective/Interval History: Patient states that he is feeling better this morning.  Shortness of breath has improved.  Denies any chest pain.  ROS: Denies any nausea or vomiting  Objective:  Vital Signs  Vitals:   03/16/18 0404 03/16/18 0701 03/16/18 1143 03/16/18 1145  BP: 124/87  117/66   Pulse: 84  77   Resp: 18  18   Temp: (!) 97.4 F (36.3 C)   (!) 92.4 F (33.6 C)  TempSrc: Oral     SpO2: 99%  98%   Weight: 86 kg 83.8 kg    Height:        Intake/Output Summary (Last 24 hours) at 03/16/2018 1335 Last data filed at 03/16/2018 1005 Gross per 24 hour  Intake 558.65 ml  Output 9200 ml  Net -8641.35 ml   Filed Weights   03/15/18 1009 03/16/18 0404 03/16/18 0701  Weight: 93.3 kg 86 kg 83.8 kg    General appearance: alert, cooperative, appears stated age and no distress Head: Normocephalic, without obvious abnormality, atraumatic Resp: Normal effort at rest.  Diminished air entry at the bases.  No definite wheezing or rhonchi.  Few crackles noted. Cardio: regular rate and rhythm, S1, S2 normal, no murmur, click, rub or gallop GI: soft, non-tender; bowel sounds normal; no masses,  no organomegaly Extremities: Minimal edema bilateral lower extremities Pulses: 2+ and symmetric Neurologic: Alert and oriented x3.  No obvious focal neurological deficits.  Lab Results:  Data Reviewed: I have personally reviewed following labs and imaging studies  CBC: Recent Labs  Lab 03/10/18 2025 03/10/18 2038  WBC 4.7  --   NEUTROABS 3.8  --   HGB 12.3* 12.6*  HCT 37.9* 37.0*  MCV 93.3  --   PLT 197  --     Basic Metabolic Panel: Recent Labs  Lab 03/12/18 0512 03/13/18 0345 03/14/18 0357 03/15/18 0409 03/16/18 0411  NA 139 138 135 133* 129*  K 3.4* 3.7 3.6 3.8 3.6  CL 107 104 99 95* 90*  CO2 23 27 29  33* 32  GLUCOSE 105* 93 97 84 95  BUN 13 15 12 14 15   CREATININE 1.02 1.02 0.93 1.00 0.99  CALCIUM 8.2* 8.2* 8.1*  8.1* 8.1*  MG 1.5* 1.3* 1.7 1.9 2.1    GFR: Estimated Creatinine Clearance: 93.6 mL/min (by C-G formula based on SCr of 0.99 mg/dL).  Liver Function Tests: Recent Labs  Lab 03/10/18 2025 03/14/18 1752 03/15/18 0409 03/16/18 0411  AST 29  --  24 65*  ALT 36  --  31 32  ALKPHOS 99  --  114 119  BILITOT 4.9* 3.4* 3.5* 3.6*  PROT 6.0*  --  6.3* 6.3*  ALBUMIN 2.0*  --  1.8* 1.8*     Coagulation Profile: Recent Labs  Lab 03/11/18 0602  INR 2.19    Cardiac Enzymes: Recent Labs  Lab 03/11/18 0114 03/11/18 0602  TROPONINI <0.03 0.04*      Radiology Studies: No results found.   Medications:  Scheduled: . carvedilol  3.125 mg Oral BID WC  . enoxaparin (LOVENOX) injection  40 mg Subcutaneous Daily  . hydrALAZINE  12.5 mg Oral Q8H  . isosorbide mononitrate  30 mg Oral Daily  . losartan  25 mg Oral Daily  . potassium chloride  40 mEq Oral Daily  . potassium chloride  40 mEq Oral Once  . sodium chloride flush  10-40 mL Intracatheter Q12H  . spironolactone  25 mg Oral Daily   Continuous: . furosemide (LASIX) infusion 10 mg/hr (03/16/18 0626)   RSW:[id number], sodium chloride flush  Assessment/Plan:    Acute respiratory failure with hypoxia Secondary to CHF exacerbation.  Patient appears to be improving gradually.  He is diuresing well.  Wean off oxygen as tolerated.  Acute systolic and diastolic CHF EF noted to be 20 to 25% based on the most recent echocardiogram.  Severe mitral and tricuspid regurgitation noted on echocardiogram.  Heart failure team is following.  Patient is on carvedilol, hydralazine, nitrates, ARB and spironolactone.  Patient is on Lasix infusion.  He is diuresing well.  Weight is decreasing.  Troponins were only mildly elevated.  Generalized anasarca Secondary to CHF.  See above  Hyperbilirubinemia Total bilirubin noted to be between 3 and 4.  Equal direct and indirect.  Ultrasound of the abdomen did show moderate ascites and sludge  and cholelithiasis with mildly thickened gallbladder.  HIDA scan showed gallbladder dysfunction without any cystic obstruction.  Continue to monitor.  His abdomen is benign.  Ultrasound did suggest nodular liver so patient could have liver cirrhosis.  Possible liver cirrhosis Nodular liver noted on ultrasound.  Noted to have mildly abnormal LFTs.  Also noted to have hypoalbuminemia.  INR was noted to be 2.19 on 10/27.  We will recheck this tomorrow.  No known history of alcohol use.  We will need to go over this history again with the patient.  He will need further work-up for liver disease as an outpatient.  Consider vitamin K depending on INR level tomorrow.  Hepatitis panel  was negative in 2016.  We will recheck tomorrow.  Medication noncompliance Importance of taking medications and follow-up emphasized to the patient.  DVT Prophylaxis: Lovenox    Code Status: Full code Family Communication: Discussed with the patient Disposition Plan: Management as outlined above.    LOS: 6 days   [facility] Pager [phone] 03/16/2018, 1:35 PM  If 7PM-7AM, please contact night-coverage at [email or web address], password [facility]

## 2018-03-16 NOTE — Progress Notes (Signed)
Last pm it was documented a subnormal temp on patient. This afternoon multiple attempts have been made with multiple devices to obtain oral temp. All readings are subnormal in spite of using digital and dynamapp device.  MD is aware and documented.

## 2018-03-16 NOTE — Progress Notes (Signed)
Occupational Therapy Treatment Patient Details Name: Kyle Conrad MRN: [id number] DOB: 1966-05-01 Today's Date: 03/16/2018    History of present illness Pt is a 52 y.o. male admitted 03/10/18 with SOB and edema; worked up for CHF, found to have massive volume overload. CXR with opacity RLL concerning for pneumonia. PMH includes CHF, HTN, cirrhosis, OSA.   OT comments  Pt completed grooming, sponge bathing and dressing with set up to moderate assistance. Now able to use R UE as lead when eating as swelling is improved. Continues to fatigue easily and moan a lot. Moves very slowly.   Follow Up Recommendations  SNF;Supervision/Assistance - 24 hour    Equipment Recommendations  3 in 1 bedside commode    Recommendations for Other Services      Precautions / Restrictions Precautions Precautions: Fall Precaution Comments: Bilateral unna boots, BUE edema       Mobility Bed Mobility Overal bed mobility: Needs Assistance Bed Mobility: Supine to Sit     Supine to sit: Min assist     General bed mobility comments: min assist for LEs over EOB and to scoot hips to EOB, increased time  Transfers Overall transfer level: Needs assistance Equipment used: Rolling walker (2 wheeled) Transfers: Sit to/from Stand Sit to Stand: Mod assist         General transfer comment: cues for hand placement, assist to rise and steady    Balance Overall balance assessment: Needs assistance   Sitting balance-[name]: Fair       Standing balance-[name]: Poor Standing balance comment: unable to release walker in static standing                           ADL either performed or assessed with clinical judgement   ADL Overall ADL's : Needs assistance/impaired Eating/Feeding: Set up;Bed level Eating/Feeding Details (indicate cue type and reason): able to feed himself with R hand Grooming: Wash/dry hands;Wash/dry face;Sitting;Set up;Oral care   Upper Body Bathing: Moderate  assistance;Sitting       Upper Body Dressing : Sitting;Moderate assistance           Toileting- Clothing Manipulation and Hygiene: Total assistance;Sit to/from stand       Functional mobility during ADLs: Minimal assistance;Rolling walker(transfer only) General ADL Comments: pt in wet bed, leaking catheter     Vision       Perception     Praxis      Cognition Arousal/Alertness: Awake/alert Behavior During Therapy: WFL for tasks assessed/performed Overall Cognitive Status: No family/caregiver present to determine baseline cognitive functioning Area of Impairment: Problem solving;Safety/judgement                             Problem Solving: Slow processing;Difficulty sequencing;Decreased initiation;Requires verbal cues          Exercises     Shoulder Instructions       General Comments      Pertinent Vitals/ Pain       Pain Assessment: Faces Faces Pain Scale: Hurts even more Pain Location: generalized Pain Descriptors / Indicators: Moaning Pain Intervention(s): Monitored during session;Repositioned  Home Living                                          Prior Functioning/Environment  Frequency  Min 2X/week        Progress Toward Goals  OT Goals(current goals can now be found in the care plan section)     Acute Rehab OT Goals Patient Stated Goal: to get stronger OT Goal Formulation: With patient Time For Goal Achievement: 03/28/18 Potential to Achieve Goals: Good  Plan Discharge plan remains appropriate    Co-evaluation                 AM-PAC PT "6 Clicks" Daily Activity     Outcome Measure   Help from another person eating meals?: A Little Help from another person taking care of personal grooming?: A Little Help from another person toileting, which includes using toliet, bedpan, or urinal?: Total Help from another person bathing (including washing, rinsing, drying)?: A Lot Help from  another person to put on and taking off regular upper body clothing?: A Little Help from another person to put on and taking off regular lower body clothing?: Total 6 Click Score: 13    End of Session Equipment Utilized During Treatment: Gait belt;Rolling walker  OT Visit Diagnosis: Unsteadiness on feet (R26.81);Other abnormalities of gait and mobility (R26.89);Muscle weakness (generalized) (M62.81);Pain   Activity Tolerance Patient limited by fatigue   Patient Left in chair;with call bell/phone within reach;with chair alarm set   Nurse Communication          Time: 2703-5009 OT Time Calculation (min): 31 min  Charges: OT General Charges $OT Visit: 1 Visit OT Treatments $Self Care/Home Management : 23-37 mins  [name], OTR/L Acute Rehabilitation Services Pager: [phone] Office: [phone]   Malka So 03/16/2018, 3:04 PM

## 2018-03-16 NOTE — Progress Notes (Signed)
CN aware or patient temp and pt "thinking about" allowing [place] to take a rectal temp.

## 2018-03-16 NOTE — Plan of Care (Signed)
  Problem: Clinical Measurements: Goal: Cardiovascular complication will be avoided Outcome: Progressing   Problem: Activity: Goal: Risk for activity intolerance will decrease Outcome: Progressing   Problem: Nutrition: Goal: Adequate nutrition will be maintained Outcome: Progressing   

## 2018-03-16 NOTE — Progress Notes (Signed)
Patient has Advanced [facility] Failure [facility] follow-up appt on 03/27/18 at 11:00 AM.

## 2018-03-16 NOTE — Progress Notes (Signed)
Explained to patient that with temp registering sub normal it was important to try and capture an accurate reading rectally.  Patient states he will have to think about allowing [place] to take a rectal temp.  Will check back.

## 2018-03-16 NOTE — Progress Notes (Addendum)
Physical Therapy Treatment Patient Details Name: Kyle Conrad MRN: [id number] DOB: [birth year] Today's Date: 03/16/2018    History of Present Illness Pt is a 52 y.o. male admitted 03/10/18 with SOB and edema; worked up for CHF, found to have massive volume overload. CXR with opacity RLL concerning for pneumonia. PMH includes CHF, HTN, cirrhosis, OSA.    PT Comments    Patient received up in chair, reports that he already worked with someone from therapy and is very fatigued, declines out of chair/standing/walking activity but willing to participate in seated exercise session. Focused on B LE strengthening in chair with manual resistance as appropriate, also performed gentle pectoral stretches due to flexed posture and patient reports of shoulder discomfort. He was left up in chair with all needs met, chair alarm active this afternoon.    Follow Up Recommendations  SNF;Supervision for mobility/OOB     Equipment Recommendations  Rolling walker with 5" wheels;3in1 (PT);Wheelchair (measurements PT)(if to return home, will need WC )    Recommendations for Other Services       Precautions / Restrictions Precautions Precautions: Fall Precaution Comments: Bilateral unna boots, BUE edema Restrictions Weight Bearing Restrictions: No    Mobility  Bed Mobility     General bed mobility comments: OOB in chair      Transfers     General transfer comment: deferred out of chair due to fatigue   Ambulation/Gait             General Gait Details: deferred out of chair due to fatigue    Stairs             Wheelchair Mobility    Modified Rankin (Stroke Patients Only)       Balance Overall balance assessment: Needs assistance   Sitting balance-[name]: Fair       Standing balance-[name]: Poor Standing balance comment: unable to release walker in static standing                            Cognition Arousal/Alertness: Awake/alert Behavior  During Therapy: WFL for tasks assessed/performed Overall Cognitive Status: No family/caregiver present to determine baseline cognitive functioning Area of Impairment: Problem solving;Safety/judgement                   Current Attention Level: Selective     Safety/Judgement: Decreased awareness of deficits;Decreased awareness of safety Awareness: Emergent Problem Solving: Slow processing;Difficulty sequencing;Decreased initiation;Requires verbal cues        Exercises      General Comments        Pertinent Vitals/Pain Pain Assessment: 0-10 Pain Score: 2  Faces Pain Scale: Hurts even more Pain Location: generalized Pain Descriptors / Indicators: Aching;Sore Pain Intervention(s): Limited activity within patient's tolerance;Monitored during session    Home Living                      Prior Function            PT Goals (current goals can now be found in the care plan section) Acute Rehab PT Goals Patient Stated Goal: to get stronger PT Goal Formulation: With patient Time For Goal Achievement: 03/27/18 Potential to Achieve Goals: Good Progress towards PT goals: Not progressing toward goals - comment(limited by fatigue today )    Frequency           PT Plan Current plan remains appropriate    Co-evaluation  AM-PAC PT "6 Clicks" Daily Activity  Outcome Measure  Difficulty turning over in bed (including adjusting bedclothes, sheets and blankets)?: Unable Difficulty moving from lying on back to sitting on the side of the bed? : Unable Difficulty sitting down on and standing up from a chair with arms (e.g., wheelchair, bedside commode, etc,.)?: Unable Help needed moving to and from a bed to chair (including a wheelchair)?: A Little Help needed walking in hospital room?: A Little Help needed climbing 3-5 steps with a railing? : A Lot 6 Click Score: 11    End of Session   Activity Tolerance: Patient limited by fatigue Patient  left: in chair;with call bell/phone within reach;with chair alarm set   PT Visit Diagnosis: Other abnormalities of gait and mobility (R26.89);Muscle weakness (generalized) (M62.81);Pain Pain - Right/Left: (bilateral ) Pain - part of body: Leg;Arm     Time: 7981-0254 PT Time Calculation (min) (ACUTE ONLY): 18 min  Charges:  $Therapeutic Exercise: 8-22 mins                     Deniece Ree PT, DPT, CBIS  Supplemental Physical Therapist [place]    Pager [phone] Acute Rehab Office [phone]

## 2018-03-17 LAB — RETICULOCYTES
IMMATURE RETIC FRACT: 9.4 % (ref 2.3–15.9)
RBC.: 4.1 MIL/uL — ABNORMAL LOW (ref 4.22–5.81)
RETIC COUNT ABSOLUTE: 51.7 10*3/uL (ref 19.0–186.0)
RETIC CT PCT: 1.3 % (ref 0.4–3.1)

## 2018-03-17 LAB — COMPREHENSIVE METABOLIC PANEL
ALT: 33 U/L (ref 0–44)
AST: 31 U/L (ref 15–41)
Albumin: 1.8 g/dL — ABNORMAL LOW (ref 3.5–5.0)
Alkaline Phosphatase: 121 U/L (ref 38–126)
Anion gap: 5 (ref 5–15)
BUN: 17 mg/dL (ref 6–20)
CHLORIDE: 90 mmol/L — AB (ref 98–111)
CO2: 34 mmol/L — AB (ref 22–32)
Calcium: 8.2 mg/dL — ABNORMAL LOW (ref 8.9–10.3)
Creatinine, Ser: 1.07 mg/dL (ref 0.61–1.24)
Glucose, Bld: 81 mg/dL (ref 70–99)
POTASSIUM: 4.1 mmol/L (ref 3.5–5.1)
Sodium: 129 mmol/L — ABNORMAL LOW (ref 135–145)
Total Bilirubin: 3.4 mg/dL — ABNORMAL HIGH (ref 0.3–1.2)
Total Protein: 6.3 g/dL — ABNORMAL LOW (ref 6.5–8.1)

## 2018-03-17 LAB — CBC
HCT: 36.3 % — ABNORMAL LOW (ref 39.0–52.0)
Hemoglobin: 12.2 g/dL — ABNORMAL LOW (ref 13.0–17.0)
MCH: 29.8 pg (ref 26.0–34.0)
MCHC: 33.6 g/dL (ref 30.0–36.0)
MCV: 88.5 fL (ref 80.0–100.0)
NRBC: 0 % (ref 0.0–0.2)
Platelets: 167 10*3/uL (ref 150–400)
RBC: 4.1 MIL/uL — ABNORMAL LOW (ref 4.22–5.81)
RDW: 16.9 % — AB (ref 11.5–15.5)
WBC: 3.3 10*3/uL — AB (ref 4.0–10.5)

## 2018-03-17 LAB — IRON AND TIBC
Iron: 61 ug/dL (ref 45–182)
Saturation Ratios: 30 % (ref 17.9–39.5)
TIBC: 200 ug/dL — AB (ref 250–450)
UIBC: 139 ug/dL

## 2018-03-17 LAB — FOLATE: Folate: 3.6 ng/mL — ABNORMAL LOW (ref >5.9)

## 2018-03-17 LAB — VITAMIN B12: Vitamin B-12: 991 pg/mL — ABNORMAL HIGH (ref 180–914)

## 2018-03-17 LAB — PROTIME-INR
INR: 1.62
PROTHROMBIN TIME: 19 s — AB (ref 11.4–15.2)

## 2018-03-17 LAB — FERRITIN: Ferritin: 208 ng/mL (ref 24–336)

## 2018-03-17 MED ORDER — INFLUENZA VAC SPLIT QUAD 0.5 ML IM SUSY: 0.5000 mL | PREFILLED_SYRINGE | INTRAMUSCULAR | Status: DC | PRN

## 2018-03-17 MED ORDER — PNEUMOCOCCAL VAC POLYVALENT 25 MCG/0.5ML IJ INJ: 0.5000 mL | INJECTION | INTRAMUSCULAR | Status: DC | PRN

## 2018-03-17 MED ORDER — ADULT MULTIVITAMIN W/MINERALS CH
1.0000 | ORAL_TABLET | Freq: Every day | ORAL | Status: DC
  Administered 2018-03-17 – 2018-03-20 (×4): 1 via ORAL
  Filled 2018-03-17 (×4): qty 1

## 2018-03-17 MED ORDER — TORSEMIDE 20 MG PO TABS
40.0000 mg | ORAL_TABLET | Freq: Every day | ORAL | Status: DC
  Administered 2018-03-17: 40 mg via ORAL
  Filled 2018-03-17: qty 2

## 2018-03-17 MED ORDER — FOLIC ACID 1 MG PO TABS
1.0000 mg | ORAL_TABLET | Freq: Every day | ORAL | Status: DC
  Administered 2018-03-17 – 2018-03-20 (×4): 1 mg via ORAL
  Filled 2018-03-17 (×4): qty 1

## 2018-03-17 MED ORDER — SODIUM CHLORIDE 0.9 % IV BOLUS
500.0000 mL | Freq: Once | INTRAVENOUS | Status: AC
  Administered 2018-03-17: 500 mL via INTRAVENOUS

## 2018-03-17 MED ORDER — HYDRALAZINE HCL 25 MG PO TABS
25.0000 mg | ORAL_TABLET | Freq: Three times a day (TID) | ORAL | Status: DC
  Filled 2018-03-17: qty 1

## 2018-03-17 NOTE — Progress Notes (Signed)
Advanced Heart Failure Rounding Note  PCP-Cardiologist: No primary care provider on file.   Subjective:    Remains on lasix drip @ 10 mg/hr. (turned down from 15 yesterday) Negative 9.3 liters. Weight down another 9 pounds.   Weight down fron 245>173 pounds.   Feels tired today. Denies CP or SOB. Feels like bloating is much improved. No orthopnea or PND.   Tbili stable at 3.4. Sodium 129 (stable). Albumin 1.8  Objective:   Weight Range: 78.6 kg Body mass index is 23.5 kg/m.   Vital Signs:   Temp:  [92.4 F (33.6 C)-97.6 F (36.4 C)] 97.6 F (36.4 C) (11/02 0404) Pulse Rate:  [77-100] 79 (11/02 0914) Resp:  [18] 18 (11/02 0404) BP: (99-118)/(66-76) 116/74 (11/02 0914) SpO2:  [98 %-100 %] 100 % (11/02 0914) Weight:  [78.6 kg] 78.6 kg (11/02 0323) Last BM Date: 03/16/18  Weight change: Filed Weights   03/16/18 0404 03/16/18 0701 03/17/18 0323  Weight: 86 kg 83.8 kg 78.6 kg    Intake/Output:   Intake/Output Summary (Last 24 hours) at 03/17/2018 1002 Last data filed at 03/17/2018 0919 Gross per 24 hour  Intake 804.54 ml  Output 5225 ml  Net -4420.46 ml      Physical Exam    General:  Lying in bed  No resp difficulty HEENT: normal Neck: supple. JVP 7. Carotids 2+ bilat; no bruits. No lymphadenopathy or thryomegaly appreciated. Cor: PMI laterally displaced. Regular rate & rhythm. No rubs, gallops or murmurs. Lungs: clear Abdomen: soft, nontender, nondistended. No hepatosplenomegaly. No bruits or masses. Good bowel sounds. Extremities: no cyanosis, clubbing, rash, trace edema + UNNA boots Neuro: alert & orientedx3, cranial nerves grossly intact. moves all 4 extremities w/o difficulty. Affect pleasant   Telemetry  NSR 70-80s personally reviewed.   EKG    No new tracings.   Labs    CBC Recent Labs    03/17/18 0423  WBC 3.3*  HGB 12.2*  HCT 36.3*  MCV 88.5  PLT 101   Basic Metabolic Panel Recent Labs    03/15/18 0409 03/16/18 0411  03/17/18 0423  NA 133* 129* 129*  K 3.8 3.6 4.1  CL 95* 90* 90*  CO2 33* 32 34*  GLUCOSE 84 95 81  BUN 14 15 17   CREATININE 1.00 0.99 1.07  CALCIUM 8.1* 8.1* 8.2*  MG 1.9 2.1  --    Liver Function Tests Recent Labs    03/16/18 0411 03/17/18 0423  AST 65* 31  ALT 32 33  ALKPHOS 119 121  BILITOT 3.6* 3.4*  PROT 6.3* 6.3*  ALBUMIN 1.8* 1.8*   No results for input(s): LIPASE, AMYLASE in the last 72 hours. Cardiac Enzymes No results for input(s): CKTOTAL, CKMB, CKMBINDEX, TROPONINI in the last 72 hours.  BNP: BNP (last 3 results) Recent Labs    03/10/18 2026  BNP 2,304.5*    ProBNP (last 3 results) No results for input(s): PROBNP in the last 8760 hours.   D-Dimer No results for input(s): DDIMER in the last 72 hours. Hemoglobin A1C No results for input(s): HGBA1C in the last 72 hours. Fasting Lipid Panel No results for input(s): CHOL, HDL, LDLCALC, TRIG, CHOLHDL, LDLDIRECT in the last 72 hours. Thyroid Function Tests No results for input(s): TSH, T4TOTAL, T3FREE, THYROIDAB in the last 72 hours.  Invalid input(s): FREET3  Other results:   Imaging    No results found.   Medications:     Scheduled Medications: . carvedilol  3.125 mg Oral BID WC  .  enoxaparin (LOVENOX) injection  40 mg Subcutaneous Daily  . hydrALAZINE  12.5 mg Oral Q8H  . isosorbide mononitrate  30 mg Oral Daily  . losartan  25 mg Oral Daily  . potassium chloride  40 mEq Oral Daily  . sodium chloride flush  10-40 mL Intracatheter Q12H  . spironolactone  25 mg Oral Daily    Infusions: . furosemide (LASIX) infusion 10 mg/hr (03/16/18 2205)    PRN Medications: acetaminophen, sodium chloride flush    Patient Profile   [name] is a 52 year old with a history of NICM,  Angioedema with ACE, anemia, HTN, cirrhosis,  OSA, and chronic systolic heart failure.   Admitted with massive anasarca and A/C systolic heart failure.    Assessment/Plan   1. A/C Systolic Heart  Failure, NICM  - Echo 03/11/18: EF 20-25% - Has had massive diuresis. Now down over 70 pounds. Volume status looks good.  - Will switch to torsemide 40 mg daily.  - CO-OX remains stable at 69% - Continue coreg 3.125 mg twice a day.   - Continue spiro 25 mg daily - Increase hydralazine to 25 mg three times a day and 30 mg imdur. SBP 100-110s - Continue losartan 25 mg daily. No ACEi or ARNI with angioedema with ACEi - Continue unna boots  2. Abdominal Pain  - HIDDA scan 03/12/18: Findings compatible with gallbladder dysfunction. No evidence of cystic duct or common bile duct obstruction. - Afebrile. No WBC today, but has been normal - Coox normal, so not likely related to low output.  - Tbili stable at 3.4.   3. CXR with questionable pneumonia - Not on antibiotics. Afebrile. No change.   4. Cirrhosis - Cirrhotic appearance on abdominal US. 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 Has moderate ascites. No change.   5. Hypomagnesemia/hyponatremia - Mag 2.1, Na 129 (stable) - Restrict free water  6. Anasarca - Albumin low at 1.9. 24 hour urine protein completed to assess for nephrotic syndrome. Urine protein <6, urine creatinine <10  7. Noncompliance  - He will need close follow up. Need to identify barriers.  - He has trouble paying for medications. Will need to provide HF medications at DC. No change.   8. Deconditioning - PT recommending SNF but he has not insurance.   Medication concerns reviewed with patient and pharmacy team. Barriers identified: Yes he will need HF medications.   He will need to be given medications prior to discharge. He will need MATCH program if discharged. Consult care management consult.   Length of Stay: [place], MD  03/17/2018, 10:02 AM  Advanced Heart Failure Team Pager [phone] (M-F; 7a - 4p)  Please contact [facility] for night-coverage after hours (4p -7a ) and weekends on [email or web address]

## 2018-03-17 NOTE — Progress Notes (Signed)
Patient resting comfortably during shift report. Denies complaints.  

## 2018-03-17 NOTE — Progress Notes (Signed)
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  Patient stated he stopped taking his medication several months ago.  He used to follow with Dr. Aundra Dubin, last seen by them 2 years ago.  He was admitted as he was found to be in severe fluid overload and CHF exacerbation.  Cardiology was consulted and patient was started on aggressive diuretics-Lasix drip.  PICC line was placed on 03/12/2018.  Spironolactone, hydralazine, Imdur and Losartan started.   Reason for Visit: Acute systolic CHF  Consultants: Cardiology  Procedures:   Transthoracic echocardiogram Study Conclusions  - Left ventricle: The cavity size was severely dilated. Wall   thickness was increased in a pattern of mild LVH. Systolic   function was severely reduced. The estimated ejection fraction   was in the range of 20% to 25%. - Aortic valve: There was mild regurgitation. - Mitral valve: MR is eccentric and severe It is more posterior   into LA - Left atrium: The atrium was moderately to severely dilated. - Right ventricle: The cavity size was severely dilated. Systolic   function was severely reduced. - Right atrium: The atrium was severely dilated. - Tricuspid valve: TV leaflets do not coapt TR is severe . - Pericardium, extracardiac: A small pericardial effusion was   identified. - Impressions: COmpared to report of echo from 2017, LVEF is worse.   TR and MR are now severe RVEF was not reported at that time, now   it is severely depressed.  Impressions:  - COmpared to report of echo from 2017, LVEF is worse. TR and MR   are now severe RVEF was not reported at that time, now  it is   severely depressed.  Antibiotics: None  Subjective/Interval History: Patient states that he feels fatigued.  Denies any chest pain or shortness of breath.  ROS: Denies any nausea or vomiting.  Objective:  Vital Signs  Vitals:   03/17/18 0404 03/17/18 0914 03/17/18 0915 03/17/18 1058  BP: 118/76 116/74 116/74 104/68  Pulse: 78 79 79 79  Resp: 18     Temp: 97.6 F (36.4 C)     TempSrc: Oral     SpO2: 98% 100% 100%   Weight:      Height:        Intake/Output Summary (Last 24 hours) at 03/17/2018 1225 Last data filed at 03/17/2018 1154 Gross per 24 hour  Intake 944.54 ml  Output 5575 ml  Net -4630.46 ml   Filed Weights   03/16/18 0404 03/16/18 0701 03/17/18 0323  Weight: 86 kg 83.8 kg 78.6 kg    General appearance: Awake alert.  In no distress. Resp: Normal effort at rest.  Few crackles at the bases.  No wheezing or rhonchi. Cardio: S1-S2 is normal regular.  No S3-S4. GI: Abdomen is soft.  Nontender nondistended.  No masses organomegaly.  Bowel sounds present normal. Extremities: Unna boots noted lower extremities Neurologic: No focal neurological deficits.  Lab Results:  Data Reviewed: I have personally reviewed following labs and imaging studies  CBC: Recent Labs  Lab 03/10/18 2025 03/10/18 2038 03/17/18 0423  WBC 4.7  --  3.3*  NEUTROABS 3.8  --   --   HGB 12.3* 12.6* 12.2*  HCT 37.9* 37.0*  36.3*  MCV 93.3  --  88.5  PLT 197  --  782    Basic Metabolic Panel: Recent Labs  Lab 03/12/18 0512 03/13/18 0345 03/14/18 0357 03/15/18 0409 03/16/18 0411 03/17/18 0423  NA 139 138 135 133* 129* 129*  K 3.4* 3.7 3.6 3.8 3.6 4.1  CL 107 104 99 95* 90* 90*  CO2 23 27 29  33* 32 34*  GLUCOSE 105* 93 97 84 95 81  BUN 13 15 12 14 15 17   CREATININE 1.02 1.02 0.93 1.00 0.99 1.07  CALCIUM 8.2* 8.2* 8.1* 8.1* 8.1* 8.2*  MG 1.5* 1.3* 1.7 1.9 2.1  --     GFR: Estimated Creatinine Clearance: 86.6 mL/min (by C-G formula based on SCr of 1.07  mg/dL).  Liver Function Tests: Recent Labs  Lab 03/10/18 2025 03/14/18 1752 03/15/18 0409 03/16/18 0411 03/17/18 0423  AST 29  --  24 65* 31  ALT 36  --  31 32 33  ALKPHOS 99  --  114 119 121  BILITOT 4.9* 3.4* 3.5* 3.6* 3.4*  PROT 6.0*  --  6.3* 6.3* 6.3*  ALBUMIN 2.0*  --  1.8* 1.8* 1.8*     Coagulation Profile: Recent Labs  Lab 03/11/18 0602 03/17/18 0423  INR 2.19 1.62    Cardiac Enzymes: Recent Labs  Lab 03/11/18 0114 03/11/18 0602  TROPONINI <0.03 0.04*      Radiology Studies: No results found.   Medications:  Scheduled: . carvedilol  3.125 mg Oral BID WC  . enoxaparin (LOVENOX) injection  40 mg Subcutaneous Daily  . hydrALAZINE  25 mg Oral Q8H  . isosorbide mononitrate  30 mg Oral Daily  . losartan  25 mg Oral Daily  . potassium chloride  40 mEq Oral Daily  . sodium chloride flush  10-40 mL Intracatheter Q12H  . spironolactone  25 mg Oral Daily  . torsemide  40 mg Oral Daily   Continuous:  [id number], sodium chloride flush    Assessment/Plan:  Acute respiratory failure with hypoxia Secondary to CHF exacerbation.  Patient is diuresing well.  He is saturating normal on room air.   Acute systolic and diastolic CHF/right ventricular failure EF noted to be 20 to 25% based on the most recent echocardiogram.  Severe mitral and tricuspid regurgitation noted on echocardiogram.  Heart failure team is following.  Patient is on carvedilol hydralazine nitrates ARB and spironolactone.  He was on Lasix infusion.  Changed over to torsemide today.  His weight has decreased.  Troponins were only mildly elevated and likely related to CHF rather than ACS.   Generalized anasarca/hyponatremia Secondary to CHF.  Hyponatremia secondary to hypervolemia.  Stable.  Possible liver cirrhosis/Hyperbilirubinemia Ultrasound of the abdomen did show moderate ascites and sludge and cholelithiasis with mildly thickened gallbladder.  Nodular appearing liver was noted  as well.  HIDA scan showed gallbladder dysfunction without any cystic obstruction. Bilirubin has been elevated but stable.  Equal direct and indirect components.  Ultrasound did not reveal any other abnormalities.  INR this morning is 1.62.  Albumin remains low at 1.8.  Abdomen remains benign.  Patient denies any significant alcohol intake.  His liver dysfunction and appearance could be due to right ventricular failure.  Will need continued monitoring in the outpatient setting.  Hepatitis panel was negative in 2016.  Repeat levels pending.  If he remains compliant with his heart failure treatment then he may need repeat studies as an outpatient and perhaps referral to Hepatology.  Normocytic anemia/Folate deficiency Hemoglobin is  stable.  Anemia panel reviewed.  Folic acid noted to be 3.6.  This will be supplemented.  Vitamin B12 level 981.  Ferritin 208.  Medication noncompliance Importance of taking medications and follow-up emphasized to the patient.  DVT Prophylaxis: Lovenox    Code Status: Full code Family Communication: Discussed with the patient Disposition Plan: Regimen as outlined above.  Skilled nursing facility recommended by physical therapy however patient does not have any insurance.  Social worker is following.  Continue intensive PT in the hospital.  He will also need medication assistance at discharge.    LOS: 7 days   [place] [facility] Pager [phone] 03/17/2018, 12:25 PM  If 7PM-7AM, please contact night-coverage at [email or web address], password [facility]

## 2018-03-17 NOTE — Progress Notes (Addendum)
Arrived at bedside to place condom cath and noticed pt had been incontinent of urine. Performed full bed bath and linen change. Noticed two wound on penis. One wound is on the underside of penis, approx. 39mm and is raised and white. The other wound is on the top of the penis and is approx. 93mm,  pink and open. Decided not to place condom cath at this time. Pt requesting pouch be placed around penis to collect urine. Primary RN notified of all of the above. Pouch not placed at this time per primary RN.  Third wound noted on right hip, just above greater trochanter. Wound was approx. 91mm with white center and nonblanchable erythema around edges.   Reassesses hip wound. Now that skin is dry, it appears as though it may be an older wound that has healed.

## 2018-03-17 NOTE — Progress Notes (Signed)
Per D.Dunn,PA ok to hold hydralazine and coreg this evening, as patient's BP has dropped to 65L systolic (verified x3)

## 2018-03-17 NOTE — Progress Notes (Addendum)
Paged by nurse for low BP 68-54 systolic, asymptomatic aside from fatigue noted in rounds. Coreg and hydralazine appropriately held. Reviewed with Dr. Haroldine Laws who felt CVP was flat this AM -> will give back 500cc fluid. I dc'd order for further hydralazine - can be re-evaluated in AM. Dayna Dunn PA-C  Addendum: f/u BP reported to be 103/60, pt tolerated bolus well. Dayna Dunn PA-C

## 2018-03-18 DIAGNOSIS — D649 Anemia, unspecified: Secondary | ICD-10-CM

## 2018-03-18 DIAGNOSIS — L8990 Pressure ulcer of unspecified site, unspecified stage: Secondary | ICD-10-CM

## 2018-03-18 DIAGNOSIS — R17 Unspecified jaundice: Secondary | ICD-10-CM

## 2018-03-18 LAB — BASIC METABOLIC PANEL
ANION GAP: 9 (ref 5–15)
BUN: 20 mg/dL (ref 6–20)
CO2: 29 mmol/L (ref 22–32)
Calcium: 8.2 mg/dL — ABNORMAL LOW (ref 8.9–10.3)
Chloride: 91 mmol/L — ABNORMAL LOW (ref 98–111)
Creatinine, Ser: 1.13 mg/dL (ref 0.61–1.24)
GFR calc Af Amer: >60 mL/min (ref >60)
GLUCOSE: 76 mg/dL (ref 70–99)
POTASSIUM: 4.3 mmol/L (ref 3.5–5.1)
Sodium: 129 mmol/L — ABNORMAL LOW (ref 135–145)

## 2018-03-18 LAB — HEPATITIS PANEL, ACUTE
HCV Ab: <0.1 s/co ratio (ref 0.0–0.9)
Hep A IgM: NEGATIVE
Hep B C IgM: NEGATIVE
Hepatitis B Surface Ag: NEGATIVE

## 2018-03-18 MED ORDER — SODIUM CHLORIDE 0.9 % IV BOLUS
500.0000 mL | Freq: Once | INTRAVENOUS | Status: AC
  Administered 2018-03-18: 500 mL via INTRAVENOUS

## 2018-03-18 NOTE — Progress Notes (Signed)
Patient resting comfortably during shift report. Denies complaints.  

## 2018-03-18 NOTE — Progress Notes (Signed)
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  Patient stated he stopped taking his medication several months ago.  He used to follow with Dr. Aundra Dubin, last seen by them 2 years ago.  He was admitted as he was found to be in severe fluid overload and CHF exacerbation.  Cardiology was consulted and patient was started on aggressive diuretics-Lasix drip.  PICC line was placed on 03/12/2018.  Spironolactone, hydralazine, Imdur and Losartan started.  Over the last 24 hours patient has been noted to have low blood pressure.  Medications have been adjusted by cardiology.  Reason for Visit: Acute systolic CHF  Consultants: Cardiology  Procedures:   Transthoracic echocardiogram Study Conclusions  - Left ventricle: The cavity size was severely dilated. Wall   thickness was increased in a pattern of mild LVH. Systolic   function was severely reduced. The estimated ejection fraction   was in the range of 20% to 25%. - Aortic valve: There was mild regurgitation. - Mitral valve: MR is eccentric and severe It is more posterior   into LA - Left atrium: The atrium was moderately to severely dilated. - Right ventricle: The cavity size was severely dilated. Systolic   function was severely reduced. - Right atrium: The atrium was severely dilated. - Tricuspid valve: TV leaflets do not coapt TR is severe . - Pericardium, extracardiac: A small pericardial effusion was   identified. - Impressions: COmpared to report of echo from 2017, LVEF is worse.   TR and MR are now severe RVEF was not reported at that time, now   it is severely depressed.  Impressions:  -  COmpared to report of echo from 2017, LVEF is worse. TR and MR   are now severe RVEF was not reported at that time, now it is   severely depressed.  Antibiotics: None  Subjective/Interval History: Patient denies any complaints this morning.  He was noted to have low blood pressure overnight.  He had to be given IV fluids.  Denies any chest pain or shortness of breath.  ROS: Feels a little queasy this morning but no vomiting.  Objective:  Vital Signs  Vitals:   03/17/18 1932 03/18/18 0356 03/18/18 1019 03/18/18 1020  BP: (!) 94/57  (!) 82/50 (!) 81/53  Pulse: 79     Resp: 18     Temp: (!) 97 F (36.1 C)     TempSrc:      SpO2: 100%     Weight:  78.9 kg    Height:        Intake/Output Summary (Last 24 hours) at 03/18/2018 1109 Last data filed at 03/18/2018 1022 Gross per 24 hour  Intake 752.97 ml  Output 1700 ml  Net -947.03 ml   Filed Weights   03/16/18 0701 03/17/18 0323 03/18/18 0356  Weight: 83.8 kg 78.6 kg 78.9 kg    General appearance: Awake alert.  In no distress Resp: Normal effort at rest.  Few crackles at the bases but mostly clear to auscultation. Cardio: S1-S2 is normal regular.  No S3-S4. GI: Abdomen remains soft.  Nontender nondistended.  No masses organomegaly.  Bowel sounds are present and normal. Extremities: Unna boots noted lower extremities Neurologic: No focal neurological deficits.  Lab  Results:  Data Reviewed: I have personally reviewed following labs and imaging studies  CBC: Recent Labs  Lab 03/17/18 0423  WBC 3.3*  HGB 12.2*  HCT 36.3*  MCV 88.5  PLT 629    Basic Metabolic Panel: Recent Labs  Lab 03/12/18 0512 03/13/18 0345 03/14/18 0357 03/15/18 0409 03/16/18 0411 03/17/18 0423 03/18/18 0420  NA 139 138 135 133* 129* 129* 129*  K 3.4* 3.7 3.6 3.8 3.6 4.1 4.3  CL 107 104 99 95* 90* 90* 91*  CO2 23 27 29  33* 32 34* 29  GLUCOSE 105* 93 97 84 95 81 76  BUN 13 15 12 14 15 17 20   CREATININE 1.02 1.02 0.93 1.00 0.99 1.07  1.13  CALCIUM 8.2* 8.2* 8.1* 8.1* 8.1* 8.2* 8.2*  MG 1.5* 1.3* 1.7 1.9 2.1  --   --     GFR: Estimated Creatinine Clearance: 82 mL/min (by C-G formula based on SCr of 1.13 mg/dL).  Liver Function Tests: Recent Labs  Lab 03/14/18 1752 03/15/18 0409 03/16/18 0411 03/17/18 0423  AST  --  24 65* 31  ALT  --  31 32 33  ALKPHOS  --  114 119 121  BILITOT 3.4* 3.5* 3.6* 3.4*  PROT  --  6.3* 6.3* 6.3*  ALBUMIN  --  1.8* 1.8* 1.8*     Coagulation Profile: Recent Labs  Lab 03/17/18 0423  INR 1.62     Radiology Studies: No results found.   Medications:  Scheduled: . carvedilol  3.125 mg Oral BID WC  . enoxaparin (LOVENOX) injection  40 mg Subcutaneous Daily  . folic acid  1 mg Oral Daily  . multivitamin with minerals  1 tablet Oral Daily  . sodium chloride flush  10-40 mL Intracatheter Q12H   Continuous: . sodium chloride 500 mL (03/18/18 1104)   [id number], Influenza vac split quadrivalent PF, pneumococcal 23 valent vaccine, sodium chloride flush    Assessment/Plan:  Acute respiratory failure with hypoxia This is secondary to CHF exacerbation.  Patient has been diuresing well.  He saturating normal on room air.  This has resolved.  Acute systolic and diastolic CHF/right ventricular failure EF noted to be 20 to 25% based on the most recent echocardiogram.  Severe mitral and tricuspid regurgitation noted on echocardiogram.  Heart failure team is following.  Patient on multiple cardiac medications including carvedilol, hydralazine, nitrates, ARB and spironolactone.  Patient was transitioned from Lasix infusion to torsemide.  His weight has decreased significantly.  Hypotensive episodes overnight requiring fluid boluses.  Medications being adjusted by cardiology.  Troponins were only mildly elevated and likely related to CHF rather than ACS.   Generalized anasarca/hyponatremia Anasarca has improved.  Hyponatremia secondary to hypervolemia.  Stable.  Possible  liver cirrhosis/Hyperbilirubinemia Ultrasound of the abdomen did show moderate ascites and sludge and cholelithiasis with mildly thickened gallbladder.  Nodular appearing liver was noted as well.  HIDA scan showed gallbladder dysfunction without any cystic obstruction. Bilirubin has been elevated but stable.  Ultrasound did not reveal any other abnormalities.  INR 1.62.  Albumin remains low at 1.8.  Abdomen remains benign.  Patient denies any significant alcohol intake.  His liver dysfunction and appearance could be due to right ventricular failure.  Will need continued monitoring in the outpatient setting.  Hepatitis panel is negative.  If he remains compliant with his heart failure treatment then he may need repeat studies as an outpatient and perhaps referral to Hepatology.  Normocytic anemia/Folate deficiency Hemoglobin is stable.  Anemia panel reviewed.  Folic acid noted to be 3.6.  This will be supplemented.  Vitamin B12 level 981.  Ferritin 208.  Medication noncompliance Importance of taking medications and follow-up emphasized to the patient.  DVT Prophylaxis: Lovenox    Code Status: Full code Family Communication: Discussed with the patient Disposition Plan: Management as outlined above.  Medications being adjusted by cardiology due to low blood pressure.  Skilled nursing facility recommended by physical therapy however patient does not have any insurance.  Social worker is following.  Continue intensive PT in the hospital.  He will also need medication assistance at discharge.    LOS: 8 days   [place] [facility] Pager [phone] 03/18/2018, 11:09 AM  If 7PM-7AM, please contact night-coverage at [email or web address], password [facility]

## 2018-03-18 NOTE — Progress Notes (Signed)
Advanced Heart Failure Rounding Note  PCP-Cardiologist: No primary care provider on file.   Subjective:    Lasix gtt stopped yesterday after 72 pound weight loss. Weight down fron 245>173 pounds. Hydralazine also increased.   However SBP dropped into 70s so hydralazine held and given 500cc NS. SBP 94-118 now.W eight stable 173. Creatinine stable.   Resting comfortably. No CP, SOB, orthopnea or PND. No dizziness. Feels tired.    Objective:   Weight Range: 78.9 kg Body mass index is 23.59 kg/m.   Vital Signs:   Temp:  [97 F (36.1 C)-98.7 F (37.1 C)] 97 F (36.1 C) (11/02 1932) Pulse Rate:  [75-90] 79 (11/02 1932) Resp:  [18] 18 (11/02 1932) BP: (83-118)/(51-74) 94/57 (11/02 1932) SpO2:  [98 %-100 %] 100 % (11/02 1932) Weight:  [78.9 kg] 78.9 kg (11/03 0356) Last BM Date: 03/16/18  Weight change: Filed Weights   03/16/18 0701 03/17/18 0323 03/18/18 0356  Weight: 83.8 kg 78.6 kg 78.9 kg    Intake/Output:   Intake/Output Summary (Last 24 hours) at 03/18/2018 0749 Last data filed at 03/18/2018 0724 Gross per 24 hour  Intake 632.97 ml  Output 2400 ml  Net -1767.03 ml      Physical Exam    General:  Lying flat in be No resp difficulty HEENT: normal Neck: supple. JVP 5-6. Carotids 2+ bilat; no bruits. No lymphadenopathy or thryomegaly appreciated. Cor: PMI laterally displaced. Regular rate & rhythm. 2/6 MR Lungs: clear Abdomen: soft, nontender, nondistended. No hepatosplenomegaly. No bruits or masses. Good bowel sounds. Extremities: no cyanosis, clubbing, rash, edema + UNNA boots  +PICC Neuro: alert & orientedx3, cranial nerves grossly intact. moves all 4 extremities w/o difficulty. Affect pleasant    Telemetry  NSR 70-80s personally reviewed.   EKG    No new tracings.   Labs    CBC Recent Labs    03/17/18 0423  WBC 3.3*  HGB 12.2*  HCT 36.3*  MCV 88.5  PLT 505   Basic Metabolic Panel Recent Labs    03/16/18 0411 03/17/18 0423  03/18/18 0420  NA 129* 129* 129*  K 3.6 4.1 4.3  CL 90* 90* 91*  CO2 32 34* 29  GLUCOSE 95 81 76  BUN 15 17 20   CREATININE 0.99 1.07 1.13  CALCIUM 8.1* 8.2* 8.2*  MG 2.1  --   --    Liver Function Tests Recent Labs    03/16/18 0411 03/17/18 0423  AST 65* 31  ALT 32 33  ALKPHOS 119 121  BILITOT 3.6* 3.4*  PROT 6.3* 6.3*  ALBUMIN 1.8* 1.8*   No results for input(s): LIPASE, AMYLASE in the last 72 hours. Cardiac Enzymes No results for input(s): CKTOTAL, CKMB, CKMBINDEX, TROPONINI in the last 72 hours.  BNP: BNP (last 3 results) Recent Labs    03/10/18 2026  BNP 2,304.5*    ProBNP (last 3 results) No results for input(s): PROBNP in the last 8760 hours.   D-Dimer No results for input(s): DDIMER in the last 72 hours. Hemoglobin A1C No results for input(s): HGBA1C in the last 72 hours. Fasting Lipid Panel No results for input(s): CHOL, HDL, LDLCALC, TRIG, CHOLHDL, LDLDIRECT in the last 72 hours. Thyroid Function Tests No results for input(s): TSH, T4TOTAL, T3FREE, THYROIDAB in the last 72 hours.  Invalid input(s): FREET3  Other results:   Imaging    No results found.   Medications:     Scheduled Medications: . carvedilol  3.125 mg Oral BID WC  .  enoxaparin (LOVENOX) injection  40 mg Subcutaneous Daily  . folic acid  1 mg Oral Daily  . isosorbide mononitrate  30 mg Oral Daily  . losartan  25 mg Oral Daily  . multivitamin with minerals  1 tablet Oral Daily  . potassium chloride  40 mEq Oral Daily  . sodium chloride flush  10-40 mL Intracatheter Q12H  . spironolactone  25 mg Oral Daily  . torsemide  40 mg Oral Daily    Infusions:   PRN Medications: acetaminophen, Influenza vac split quadrivalent PF, pneumococcal 23 valent vaccine, sodium chloride flush    Patient Profile   [name] is a 52 year old with a history of NICM,  Angioedema with ACE, anemia, HTN, cirrhosis,  OSA, and chronic systolic heart failure.   Admitted with massive  anasarca and A/C systolic heart failure.    Assessment/Plan   1. A/C Systolic Heart Failure, NICM  - Echo 03/11/18: EF 20-25% - Has had massive diuresis. Now down over 70 pounds. Volume status looks on low side. Had to give IVF back yesterday with SBP in 70s - Hold diuretics today. Consider adding back torsemide 40 mg daily tomorrow or tuesday.  - Check co-ox in am.  - Continue coreg 3.125 mg twice a day.   - Continue spiro 25 mg daily - Hydralazine on hold with low BP. Remains on 30 mg imdur. - Continue losartan 25 mg daily. No ACEi or ARNI with angioedema with ACEi - Can remove unna boots  2. Abdominal Pain  - HIDDA scan 03/12/18: Findings compatible with gallbladder dysfunction. No evidence of cystic duct or common bile duct obstruction. - Afebrile. No WBC today, but has been normal - Coox normal, so not likely related to low output.  - Tbili stable at 3.4.   3. CXR with questionable pneumonia - Not on antibiotics. Afebrile. No change.   4. Cirrhosis - Cirrhotic appearance on abdominal US. 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 Has moderate ascites. No change.   5. Hypomagnesemia/hyponatremia - Mag 2.1, Na 129 (stable) - Restrict free water  6. Anasarca - Albumin low at 1.9. 24 hour urine protein completed to assess for nephrotic syndrome. Urine protein <6, urine creatinine <10  7. Noncompliance  - He will need close follow up. Need to identify barriers.  - He has trouble paying for medications. Will need to provide HF medications at DC. No change.   8. Deconditioning - PT recommending SNF but he has not insurance.   Medication concerns reviewed with patient and pharmacy team. Barriers identified: Yes he will need HF medications.   He will need to be given medications prior to discharge. He will need MATCH program if discharged. Consult care management consult.   Length of Stay: Pax, MD  03/18/2018, 7:49 AM  Advanced Heart Failure Team Pager  [phone] (M-F; 7a - 4p)  Please contact [email or web address] for night-coverage after hours (4p -7a ) and weekends on [email or web address]

## 2018-03-18 NOTE — Progress Notes (Signed)
Patients systolic pressure has returned to low 80s, page sent to cardiology prior to administering any cardiac medications today; due to recent necessary changes to orders.

## 2018-03-18 NOTE — Progress Notes (Addendum)
D/w Dr. Haroldine Laws. Will hold his AM meds and give another 500cc bolus today. I've taken AM meds off MAR to allow team to trend BP and adjust if needed prior administration tomorrow, but left Coreg on with hold parameters this PM. Can re-add back regimen as BP allows. Continue to monitor. Nurse updated of plan. Crystalmarie Yasin PA-C

## 2018-03-19 ENCOUNTER — Inpatient Hospital Stay (HOSPITAL_COMMUNITY): Payer: Self-pay

## 2018-03-19 DIAGNOSIS — R109 Unspecified abdominal pain: Secondary | ICD-10-CM

## 2018-03-19 LAB — BASIC METABOLIC PANEL
ANION GAP: 3 — AB (ref 5–15)
BUN: 17 mg/dL (ref 6–20)
CALCIUM: 8.2 mg/dL — AB (ref 8.9–10.3)
CO2: 30 mmol/L (ref 22–32)
Chloride: 95 mmol/L — ABNORMAL LOW (ref 98–111)
Creatinine, Ser: 0.99 mg/dL (ref 0.61–1.24)
Glucose, Bld: 83 mg/dL (ref 70–99)
Potassium: 4 mmol/L (ref 3.5–5.1)
Sodium: 128 mmol/L — ABNORMAL LOW (ref 135–145)

## 2018-03-19 LAB — CBC
HCT: 38.8 % — ABNORMAL LOW (ref 39.0–52.0)
Hemoglobin: 13.1 g/dL (ref 13.0–17.0)
MCH: 30.3 pg (ref 26.0–34.0)
MCHC: 33.8 g/dL (ref 30.0–36.0)
MCV: 89.6 fL (ref 80.0–100.0)
NRBC: 0 % (ref 0.0–0.2)
PLATELETS: 215 10*3/uL (ref 150–400)
RBC: 4.33 MIL/uL (ref 4.22–5.81)
RDW: 16.3 % — AB (ref 11.5–15.5)
WBC: 4.1 10*3/uL (ref 4.0–10.5)

## 2018-03-19 LAB — HEPATIC FUNCTION PANEL
ALT: 32 U/L (ref 0–44)
AST: 34 U/L (ref 15–41)
Albumin: 2 g/dL — ABNORMAL LOW (ref 3.5–5.0)
Alkaline Phosphatase: 121 U/L (ref 38–126)
BILIRUBIN DIRECT: 1.6 mg/dL — AB (ref 0.0–0.2)
BILIRUBIN INDIRECT: 1.8 mg/dL — AB (ref 0.3–0.9)
BILIRUBIN TOTAL: 3.4 mg/dL — AB (ref 0.3–1.2)
Total Protein: 7.9 g/dL (ref 6.5–8.1)

## 2018-03-19 LAB — COOXEMETRY PANEL
CARBOXYHEMOGLOBIN: 1.3 % (ref 0.5–1.5)
Methemoglobin: 1.7 % — ABNORMAL HIGH (ref 0.0–1.5)
O2 SAT: 67.7 %
Total hemoglobin: 12.6 g/dL (ref 12.0–16.0)

## 2018-03-19 MED ORDER — INFLUENZA VAC SPLIT QUAD 0.5 ML IM SUSY
0.5000 mL | PREFILLED_SYRINGE | INTRAMUSCULAR | Status: AC
  Administered 2018-03-20: 0.5 mL via INTRAMUSCULAR
  Filled 2018-03-19: qty 0.5

## 2018-03-19 MED ORDER — CARVEDILOL 3.125 MG PO TABS
3.1250 mg | ORAL_TABLET | Freq: Two times a day (BID) | ORAL | Status: DC
  Administered 2018-03-19 – 2018-03-20 (×3): 3.125 mg via ORAL
  Filled 2018-03-19 (×3): qty 1

## 2018-03-19 MED ORDER — POLYETHYLENE GLYCOL 3350 17 G PO PACK
17.0000 g | PACK | Freq: Once | ORAL | Status: DC
  Filled 2018-03-19: qty 1

## 2018-03-19 MED ORDER — DOCUSATE SODIUM 100 MG PO CAPS
100.0000 mg | ORAL_CAPSULE | Freq: Two times a day (BID) | ORAL | Status: DC
  Administered 2018-03-20: 100 mg via ORAL
  Filled 2018-03-19 (×3): qty 1

## 2018-03-19 MED ORDER — SPIRONOLACTONE 12.5 MG HALF TABLET
12.5000 mg | ORAL_TABLET | Freq: Every day | ORAL | Status: DC
  Administered 2018-03-19 – 2018-03-20 (×2): 12.5 mg via ORAL
  Filled 2018-03-19 (×3): qty 1

## 2018-03-19 MED ORDER — BISACODYL 5 MG PO TBEC: 5.0000 mg | DELAYED_RELEASE_TABLET | Freq: Every day | ORAL | Status: DC | PRN

## 2018-03-19 NOTE — Progress Notes (Signed)
Occupational Therapy Treatment Patient Details Name: Kyle Conrad MRN: [id number] DOB: [birth year] Today's Date: 03/19/2018    History of present illness Pt is a 52 y.o. male admitted 03/10/18 with SOB and edema; worked up for CHF, found to have massive volume overload. CXR with opacity RLL concerning for pneumonia. PMH includes CHF, HTN, cirrhosis, OSA.   OT comments  This 52 yo male admitted with above presents to acute OT making great gains towards goals with toilet transfers, self care, and mobility. He will continue to benefit from acute OT with follow up OT at SNF due to pt needs to be independent due to he lives alone.  Follow Up Recommendations  SNF;Supervision/Assistance - 24 hour    Equipment Recommendations  3 in 1 bedside commode       Precautions / Restrictions Precautions Precautions: Fall Precaution Comments: Una boots now removed with RLE still slightly edematous compared to LLE, no Bil UE edema Restrictions Weight Bearing Restrictions: No       Mobility Bed Mobility               General bed mobility comments: OOB in chair   Transfers Overall transfer level: Needs assistance Equipment used: Rolling walker (2 wheeled) Transfers: Sit to/from Stand Sit to Stand: Min assist         General transfer comment: Pt ambulated 50 feet with RW and min A    Balance Overall balance assessment: Needs assistance Sitting-balance support: No upper extremity supported;Feet supported Sitting balance-[name]: Good       Standing balance-[name]: Poor Standing balance comment: unable to release walker in static standing; heavy reliance on RW with ambulation--cannot push walker and ambulate at same time has to move walker and then move feet                           ADL either performed or assessed with clinical judgement   ADL Overall ADL's : Needs assistance/impaired             Lower Body Bathing: Minimal assistance;Sit to/from  stand Lower Body Bathing Details (indicate cue type and reason): increased time for bathing     Lower Body Dressing: Minimal assistance;Sit to/from stand Lower Body Dressing Details (indicate cue type and reason): increased time for dressing tasks Toilet Transfer: Minimal assistance;Ambulation;RW Toilet Transfer Details (indicate cue type and reason): recliner> 50 feet to sit in recliner behind him                 Vision Patient Visual Report: No change from baseline            Cognition Arousal/Alertness: Awake/alert Behavior During Therapy: WFL for tasks assessed/performed Overall Cognitive Status: Within Functional Limits for tasks assessed                                 General Comments: Pt processing information better this session. Is still aware his car is at [employer] and that his car keys got thrown away when his clothes were cut off of him in ED. Pt asking RN when she came into room if he had contacted "worker" about his keys and car (RN stated she had)        Exercises Other Exercises Other Exercises: Pt arms/hands are much better, not edema at all today and using them functionally throughout session  Pertinent Vitals/ Pain       Pain Assessment: 0-10 Pain Score: 5  Pain Location: right foot and ankle Pain Descriptors / Indicators: Aching;Sore Pain Intervention(s): Limited activity within patient's tolerance;Monitored during session;Repositioned         Frequency  Min 2X/week        Progress Toward Goals  OT Goals(current goals can now be found in the care plan section)  Progress towards OT goals: Progressing toward goals     Plan Discharge plan remains appropriate       AM-PAC PT "6 Clicks" Daily Activity     Outcome Measure   Help from another person eating meals?: None Help from another person taking care of personal grooming?: A Little Help from another person toileting, which includes using toliet, bedpan, or  urinal?: A Little Help from another person bathing (including washing, rinsing, drying)?: A Little Help from another person to put on and taking off regular upper body clothing?: A Little Help from another person to put on and taking off regular lower body clothing?: A Little 6 Click Score: 19    End of Session Equipment Utilized During Treatment: Gait belt;Rolling walker  OT Visit Diagnosis: Unsteadiness on feet (R26.81);Other abnormalities of gait and mobility (R26.89);Muscle weakness (generalized) (M62.81);Pain Pain - Right/Left: Right Pain - part of body: Ankle and joints of foot   Activity Tolerance Patient tolerated treatment well   Patient Left in chair;with call bell/phone within reach;with chair alarm set;with nursing/sitter in room   Nurse Communication          Time: 1517-6160 OT Time Calculation (min): 53 min  Charges: OT General Charges $OT Visit: 1 Visit OT Treatments $Self Care/Home Management : 53-67 mins  Kyle Conrad, OTR/L Acute Rehab Services Pager [phone] Office [phone]      Kyle Conrad 03/19/2018, 5:31 PM

## 2018-03-19 NOTE — Progress Notes (Signed)
Patient refused his Miralax this morning. Patient had two bowel movements this morning.

## 2018-03-19 NOTE — Progress Notes (Signed)
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  Patient stated he stopped taking his medication several months ago.  He used to follow with Dr. Aundra Dubin, last seen by them 2 years ago.  He was admitted as he was found to be in severe fluid overload and CHF exacerbation.  Cardiology was consulted and patient was started on aggressive diuretics-Lasix drip.  PICC line was placed on 03/12/2018.  Spironolactone, hydralazine, Imdur and Losartan started.  Subsequently patient was noted to be hypotensive with some dizziness.  Cardiac medications were held.  He was given IV fluids.  Seems to be improved.    Reason for Visit: Acute systolic CHF  Consultants: Cardiology  Procedures:   Transthoracic echocardiogram Study Conclusions  - Left ventricle: The cavity size was severely dilated. Wall   thickness was increased in a pattern of mild LVH. Systolic   function was severely reduced. The estimated ejection fraction   was in the range of 20% to 25%. - Aortic valve: There was mild regurgitation. - Mitral valve: MR is eccentric and severe It is more posterior   into LA - Left atrium: The atrium was moderately to severely dilated. - Right ventricle: The cavity size was severely dilated. Systolic   function was severely reduced. - Right atrium: The atrium was severely dilated. - Tricuspid valve: TV leaflets do not coapt TR is severe . - Pericardium, extracardiac: A small pericardial effusion was   identified. - Impressions: COmpared to report of echo from 2017, LVEF is worse.   TR and MR are now severe RVEF was not reported at that time, now   it is severely  depressed.  Impressions:  - COmpared to report of echo from 2017, LVEF is worse. TR and MR   are now severe RVEF was not reported at that time, now it is   severely depressed.  Antibiotics: None  Subjective/Interval History: Patient denies any chest pain or shortness of breath.  He did have some abdominal discomfort somewhat relieved after he had a bowel movement.  Denies any nausea or vomiting.    ROS: Denies any dizziness or lightheadedness currently  Objective:  Vital Signs  Vitals:   03/18/18 1728 03/18/18 1919 03/19/18 0405 03/19/18 0821  BP: 99/61 97/66 104/66   Pulse: 89 81 78   Resp:   18   Temp:  98.4 F (36.9 C) 98.5 F (36.9 C)   TempSrc:  Oral Oral   SpO2:  100% 100%   Weight:   63.7 kg 73.4 kg  Height:        Intake/Output Summary (Last 24 hours) at 03/19/2018 1026 Last data filed at 03/19/2018 0932 Gross per 24 hour  Intake 966.88 ml  Output 300 ml  Net 666.88 ml   Filed Weights   03/18/18 0356 03/19/18 0405 03/19/18 0821  Weight: 78.9 kg 63.7 kg 73.4 kg    General appearance: Awake alert.  In no distress Resp: Normal effort at rest.  Clear to auscultation bilaterally. Cardio: S1-S2 is normal regular.  No rubs or bruit. GI: Abdomen is soft.  Mildly tender diffusely without any rebound rigidity or guarding this morning.  Bowel sounds are present normal.   Extremities: Mild edema bilateral lower extremities Neurologic: Alert  and oriented x3.  No focal neurological deficits.  Lab Results:  Data Reviewed: I have personally reviewed following labs and imaging studies  CBC: Recent Labs  Lab 03/17/18 0423 03/19/18 0848  WBC 3.3* 4.1  HGB 12.2* 13.1  HCT 36.3* 38.8*  MCV 88.5 89.6  PLT 167 284    Basic Metabolic Panel: Recent Labs  Lab 03/13/18 0345 03/14/18 0357 03/15/18 0409 03/16/18 0411 03/17/18 0423 03/18/18 0420 03/19/18 0404  NA 138 135 133* 129* 129* 129* 128*  K 3.7 3.6 3.8 3.6 4.1 4.3 4.0  CL 104 99 95* 90* 90* 91* 95*    CO2 27 29 33* 32 34* 29 30  GLUCOSE 93 97 84 95 81 76 83  BUN 15 12 14 15 17 20 17   CREATININE 1.02 0.93 1.00 0.99 1.07 1.13 0.99  CALCIUM 8.2* 8.1* 8.1* 8.1* 8.2* 8.2* 8.2*  MG 1.3* 1.7 1.9 2.1  --   --   --     GFR: Estimated Creatinine Clearance: 88.6 mL/min (by C-G formula based on SCr of 0.99 mg/dL).  Liver Function Tests: Recent Labs  Lab 03/14/18 1752 03/15/18 0409 03/16/18 0411 03/17/18 0423 03/19/18 0848  AST  --  24 65* 31 34  ALT  --  31 32 33 32  ALKPHOS  --  114 119 121 121  BILITOT 3.4* 3.5* 3.6* 3.4* 3.4*  PROT  --  6.3* 6.3* 6.3* 7.9  ALBUMIN  --  1.8* 1.8* 1.8* 2.0*     Coagulation Profile: Recent Labs  Lab 03/17/18 0423  INR 1.62     Radiology Studies: No results found.   Medications:  Scheduled: . carvedilol  3.125 mg Oral BID WC  . docusate sodium  100 mg Oral BID  . enoxaparin (LOVENOX) injection  40 mg Subcutaneous Daily  . folic acid  1 mg Oral Daily  . [START ON 03/20/2018] Influenza vac split quadrivalent PF  0.5 mL Intramuscular Tomorrow-1000  . multivitamin with minerals  1 tablet Oral Daily  . polyethylene glycol  17 g Oral Once  . sodium chloride flush  10-40 mL Intracatheter Q12H  . spironolactone  12.5 mg Oral Daily   Continuous:  XLK:[id number], bisacodyl, pneumococcal 23 valent vaccine, sodium chloride flush    Assessment/Plan:  Acute respiratory failure with hypoxia This is secondary to CHF exacerbation.  In terms improved with diuresis.  He is saturating normal on room air.    Acute systolic and diastolic CHF/right ventricular failure EF noted to be 20 to 25% based on the most recent echocardiogram.  Severe mitral and tricuspid regurgitation noted on echocardiogram.  Heart failure team is following.  Patient was started on IV diuretics and was also placed on cardiac medications including carvedilol, hydralazine, nitrates, ARB and spironolactone.  He was transitioned from Lasix infusion to torsemide.  His weight  decreased significantly.  However he subsequently became hypotensive.  He had to be given fluid boluses.  His diuretics and other blood pressure lowering agents were held.  Further management per cardiology.   Abdominal pain Abdomen is mildly tender.  It appears to be due to constipation.  It appears that cardiology has already started the patient on laxatives.  LFTs are normal.  X-ray is also been ordered.  Continue to monitor.  Possible liver cirrhosis/Hyperbilirubinemia Ultrasound of the abdomen did show moderate ascites and sludge and cholelithiasis with mildly thickened gallbladder.  Nodular appearing liver was noted as well.  HIDA scan showed gallbladder dysfunction without any cystic obstruction. Bilirubin has  been elevated but stable.  Ultrasound did not reveal any other abnormalities.  INR 1.62.  Albumin remains low at 1.8.  Patient denies any significant alcohol intake.  His liver dysfunction and appearance could be due to right ventricular failure.  Will need continued monitoring in the outpatient setting.  Hepatitis panel is negative.  If he remains compliant with his heart failure treatment then he may need repeat studies as an outpatient and perhaps referral to Hepatology.  Normocytic anemia/Folate deficiency Hemoglobin is stable.  Anemia panel reviewed.  Folic acid noted to be 3.6.  This is being supplemented.  Vitamin B12 level 981.  Ferritin 208.  Generalized anasarca/hyponatremia Anasarca has improved.  Hyponatremia secondary to hypervolemia.  Stable.  Medication noncompliance Importance of taking medications and follow-up emphasized to the patient.  DVT Prophylaxis: Lovenox    Code Status: Full code Family Communication: Discussed with the patient Disposition Plan: Management as outlined above.  No acute cleared by cardiology.  Physical therapy to reevaluate patient.  Social worker and case management following.      LOS: 9 days   [facility] Pager [phone] 03/19/2018, 10:26 AM  If 7PM-7AM, please contact night-coverage at [email or web address], password [facility]

## 2018-03-19 NOTE — Progress Notes (Signed)
Patient ID: Kyle Conrad, male   DOB: [birth year], 52 y.o.   MRN: [id number]     Advanced Heart Failure Rounding Note  PCP-Cardiologist: No primary care provider on file.   Subjective:    Lasix gtt stopped after 72 pound weight loss. Weight down fron 245>173 pounds.  Mildly hypotensive yesterday but dizzy, cardiac meds held and got back some IV fluid.   This morning, SBP 105.  Not dizzy but has not been out of bed.  Main complaint is abdominal pain, feels like he is constipated and needs BM.    No dyspnea or orthopnea.   Objective:   Weight Range: 63.7 kg Body mass index is 19.05 kg/m.   Vital Signs:   Temp:  [98 F (36.7 C)-98.5 F (36.9 C)] 98.5 F (36.9 C) (11/04 0405) Pulse Rate:  [77-89] 78 (11/04 0405) Resp:  [18] 18 (11/04 0405) BP: (81-104)/(50-66) 104/66 (11/04 0405) SpO2:  [100 %] 100 % (11/04 0405) Weight:  [63.7 kg] 63.7 kg (11/04 0405) Last BM Date: 03/16/18  Weight change: Filed Weights   03/17/18 0323 03/18/18 0356 03/19/18 0405  Weight: 78.6 kg 78.9 kg 63.7 kg    Intake/Output:   Intake/Output Summary (Last 24 hours) at 03/19/2018 0801 Last data filed at 03/19/2018 0300 Gross per 24 hour  Intake 846.88 ml  Output 450 ml  Net 396.88 ml      Physical Exam    General: NAD Neck: JVP 7 cm, no thyromegaly or thyroid nodule.  Lungs: Clear to auscultation bilaterally with normal respiratory effort. CV: Nondisplaced PMI.  Heart regular S1/S2, no S3/S4, no murmur.  No peripheral edema.   Abdomen: Soft, nontender, no hepatosplenomegaly, no distention.  Skin: Intact without lesions or rashes.  Neurologic: Alert and oriented x 3.  Psych: Normal affect. Extremities: No clubbing or cyanosis.  HEENT: Normal.    Telemetry   NSR 70s personally reviewed.   EKG    No new tracings.   Labs    CBC Recent Labs    03/17/18 0423  WBC 3.3*  HGB 12.2*  HCT 36.3*  MCV 88.5  PLT 737   Basic Metabolic Panel Recent Labs    03/18/18 0420  03/19/18 0404  NA 129* 128*  K 4.3 4.0  CL 91* 95*  CO2 29 30  GLUCOSE 76 83  BUN 20 17  CREATININE 1.13 0.99  CALCIUM 8.2* 8.2*   Liver Function Tests Recent Labs    03/17/18 0423  AST 31  ALT 33  ALKPHOS 121  BILITOT 3.4*  PROT 6.3*  ALBUMIN 1.8*   No results for input(s): LIPASE, AMYLASE in the last 72 hours. Cardiac Enzymes No results for input(s): CKTOTAL, CKMB, CKMBINDEX, TROPONINI in the last 72 hours.  BNP: BNP (last 3 results) Recent Labs    03/10/18 2026  BNP 2,304.5*    ProBNP (last 3 results) No results for input(s): PROBNP in the last 8760 hours.   D-Dimer No results for input(s): DDIMER in the last 72 hours. Hemoglobin A1C No results for input(s): HGBA1C in the last 72 hours. Fasting Lipid Panel No results for input(s): CHOL, HDL, LDLCALC, TRIG, CHOLHDL, LDLDIRECT in the last 72 hours. Thyroid Function Tests No results for input(s): TSH, T4TOTAL, T3FREE, THYROIDAB in the last 72 hours.  Invalid input(s): FREET3  Other results:   Imaging    No results found.   Medications:     Scheduled Medications: . carvedilol  3.125 mg Oral BID WC  . enoxaparin (LOVENOX) injection  40  mg Subcutaneous Daily  . folic acid  1 mg Oral Daily  . [START ON 03/20/2018] Influenza vac split quadrivalent PF  0.5 mL Intramuscular Tomorrow-1000  . multivitamin with minerals  1 tablet Oral Daily  . sodium chloride flush  10-40 mL Intracatheter Q12H  . spironolactone  12.5 mg Oral Daily    Infusions:   PRN Medications: acetaminophen, pneumococcal 23 valent vaccine, sodium chloride flush    Patient Profile   Kyle Conrad is a 52 year old with a history of NICM,  Angioedema with ACE, anemia, HTN, cirrhosis,  OSA, and chronic systolic heart failure.   Admitted with massive anasarca and A/C systolic heart failure.    Assessment/Plan   1. Acute on chronic systolic CHF: NICM. Echo 03/11/18: EF 20-25% with severe LV dilation, severe RV dilation and  dysfunction. Has had massive diuresis. Now down over 70 pounds. He is not volume overloaded today.  BP-active meds and diuretics held yesterday with low BP and he got some fluid back.  SBP 105 today.  Co-ox 68%.   - Continue to hold diuretics today. Probably restart torsemide 40 mg daily tomorrow.  - Continue coreg 3.125 mg twice a day (he can get this morning).   - Restart spironolactone 12.5 mg daily.  - Restart losartan 12.5 mg daily tomorrow if BP remains stable.  - Hydralazine/imdur to remain on hold for now. - No ACEi or ARNI with angioedema with ACEi 2. Abdominal Pain: HIDDA scan 03/12/18 with findings compatible with gallbladder dysfunction, no evidence of cystic duct or common bile duct obstruction.  He now has recurrent abdominal pain, feels constipated.  ?If this is related to gallbladder versus constipation. Afebrile. Co-ox normal today and BP stable, so hopefully not ischemic bowel.  - Will get abdominal plain film today.  - Will order LFTs and CBC today.  - Will try laxative.  3. Cirrhosis: Liver with cirrhotic appearance on abdominal US. 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 Had moderate ascites. No change.  4. Hyponatremia: Mild. - Restrict free water 5. Anasarca: Albumin low at 1.9. 24 hour urine protein completed to assess for nephrotic syndrome => Urine protein < reportable range/24 hrs. Anasarca resolved with diuresis.  6. Noncompliance: He will need close follow up. Need to identify barriers. He has trouble paying for medications. Will need to provide HF medications at DC.  7. Deconditioning: PT recommending SNF but he has no insurance.   Out of bed to ambulate.   He will need to be given medications prior to discharge. He will need MATCH program if discharged. Consult care management.   Length of Stay: 9  Loralie Champagne, MD  03/19/2018, 8:01 AM  Advanced Heart Failure Team Pager [phone] (M-F; 7a - 4p)  Please contact [facility] for night-coverage after hours (4p  -7a ) and weekends on [email or web address]

## 2018-03-20 LAB — BASIC METABOLIC PANEL
Anion gap: 7 (ref 5–15)
BUN: 17 mg/dL (ref 6–20)
CALCIUM: 8.2 mg/dL — AB (ref 8.9–10.3)
CHLORIDE: 93 mmol/L — AB (ref 98–111)
CO2: 29 mmol/L (ref 22–32)
CREATININE: 0.92 mg/dL (ref 0.61–1.24)
GFR calc non Af Amer: >60 mL/min (ref >60)
Glucose, Bld: 90 mg/dL (ref 70–99)
Potassium: 4 mmol/L (ref 3.5–5.1)
Sodium: 129 mmol/L — ABNORMAL LOW (ref 135–145)

## 2018-03-20 LAB — COOXEMETRY PANEL
Carboxyhemoglobin: 1.8 % — ABNORMAL HIGH (ref 0.5–1.5)
Methemoglobin: 1 % (ref 0.0–1.5)
O2 SAT: 92.8 %
Total hemoglobin: 12.1 g/dL (ref 12.0–16.0)

## 2018-03-20 MED ORDER — LOSARTAN POTASSIUM 25 MG PO TABS
12.5000 mg | ORAL_TABLET | Freq: Every day | ORAL | Status: DC
  Administered 2018-03-20: 12.5 mg via ORAL
  Filled 2018-03-20: qty 1

## 2018-03-20 MED ORDER — LOSARTAN POTASSIUM 25 MG PO TABS: 12.5000 mg | ORAL_TABLET | Freq: Every day | ORAL | 0 refills | Status: DC

## 2018-03-20 MED ORDER — DOCUSATE SODIUM 100 MG PO CAPS: 100.0000 mg | ORAL_CAPSULE | Freq: Two times a day (BID) | ORAL | 0 refills | Status: AC

## 2018-03-20 MED ORDER — SPIRONOLACTONE 25 MG PO TABS: 12.5000 mg | ORAL_TABLET | Freq: Every day | ORAL | 0 refills | Status: DC

## 2018-03-20 MED ORDER — POLYETHYLENE GLYCOL 3350 17 G PO PACK: 17.0000 g | PACK | Freq: Every day | ORAL | 0 refills | Status: DC | PRN

## 2018-03-20 MED ORDER — CARVEDILOL 3.125 MG PO TABS: 3.1250 mg | ORAL_TABLET | Freq: Two times a day (BID) | ORAL | 0 refills | Status: DC

## 2018-03-20 MED ORDER — FOLIC ACID 1 MG PO TABS: 1.0000 mg | ORAL_TABLET | Freq: Every day | ORAL | 0 refills | Status: DC

## 2018-03-20 MED ORDER — ADULT MULTIVITAMIN W/MINERALS CH: 1.0000 | ORAL_TABLET | Freq: Every day | ORAL | 0 refills | Status: DC

## 2018-03-20 MED ORDER — TORSEMIDE 20 MG PO TABS: 40.0000 mg | ORAL_TABLET | Freq: Every day | ORAL | 0 refills | Status: DC

## 2018-03-20 MED ORDER — TORSEMIDE 20 MG PO TABS
40.0000 mg | ORAL_TABLET | Freq: Every day | ORAL | Status: DC
  Administered 2018-03-20: 40 mg via ORAL
  Filled 2018-03-20: qty 2

## 2018-03-20 MED FILL — CARVEDILOL 3.125 MG TABLET: 3.125 | 30 days supply | Qty: 60 | Fill #0

## 2018-03-20 MED FILL — DOK 100 MG CAPS: 100 | 30 days supply | Qty: 60 | Fill #0

## 2018-03-20 MED FILL — POLYETHYLENE GLYCOL 3350 PO: 14 days supply | Qty: 30 | Fill #0

## 2018-03-20 MED FILL — LOSARTAN POTASSIUM 25 MG TA: 25 | 15 days supply | Qty: 15 | Fill #0

## 2018-03-20 MED FILL — FOLIC ACID 1 MG TABS: 1 | 30 days supply | Qty: 30 | Fill #0

## 2018-03-20 MED FILL — SPIRONOLACTONE 25 MG TABLET: 25 | 30 days supply | Qty: 15 | Fill #0

## 2018-03-20 MED FILL — TORSEMIDE 20 MG TABLET: 20 | 15 days supply | Qty: 30 | Fill #0

## 2018-03-20 NOTE — Progress Notes (Signed)
PT Cancellation Note  Patient Details Name: Kyle Conrad MRN: [id number] DOB: Oct 09, 1965   Cancelled Treatment:    Reason Eval/Treat Not Completed: Other (comment)(Refused this am.  Will reattempt as able.  )   Godfrey Pick Sharlynn Seckinger 03/20/2018, 11:26 AM Bryahna Lesko,PT Acute Rehabilitation Services Pager:  [phone]  Office:  [phone]

## 2018-03-20 NOTE — Progress Notes (Signed)
Patient referral received for HF [employer].  I have sent all appropriate paperwork via secure email to the [employer] team.

## 2018-03-20 NOTE — Progress Notes (Signed)
Patient ID: Kyle Conrad, male   DOB: [birth year], 52 y.o.   MRN: [id number]     Advanced Heart Failure Rounding Note  PCP-Cardiologist: No primary care provider on file.   Subjective:    Lasix gtt stopped after 72 pound weight loss. Has been off diuretic for a couple days with soft BP and dizziness, this has resolved with SBP 90s-110s.   Abdominal pain resolved with BM.  He did some walking yesterday, still needs assistance.     No dyspnea or orthopnea.   Objective:   Weight Range: 76.2 kg Body mass index is 22.78 kg/m.   Vital Signs:   Temp:  [97.5 F (36.4 C)-98.1 F (36.7 C)] 98 F (36.7 C) (11/05 0529) Pulse Rate:  [77-85] 85 (11/05 0529) Resp:  [18-20] 18 (11/05 0529) BP: (87-117)/(68-82) 97/68 (11/05 0529) SpO2:  [90 %-100 %] 95 % (11/05 0529) Weight:  [73.4 kg-76.2 kg] 76.2 kg (11/05 0529) Last BM Date: 03/19/18  Weight change: Filed Weights   03/19/18 0405 03/19/18 0821 03/20/18 0529  Weight: 63.7 kg 73.4 kg 76.2 kg    Intake/Output:   Intake/Output Summary (Last 24 hours) at 03/20/2018 0759 Last data filed at 03/20/2018 0500 Gross per 24 hour  Intake 1320 ml  Output 950 ml  Net 370 ml      Physical Exam    General: NAD Neck: No JVD, no thyromegaly or thyroid nodule.  Lungs: Clear to auscultation bilaterally with normal respiratory effort. CV: Nondisplaced PMI.  Heart regular S1/S2, no S3/S4, 1/6 HSM LLSB/apex.  No peripheral edema.   Abdomen: Soft, nontender, no hepatosplenomegaly, no distention.  Skin: Intact without lesions or rashes.  Neurologic: Alert and oriented x 3.  Psych: Normal affect. Extremities: No clubbing or cyanosis.  HEENT: Normal.    Telemetry   NSR 70s personally reviewed.   EKG    No new tracings.   Labs    CBC Recent Labs    03/19/18 0848  WBC 4.1  HGB 13.1  HCT 38.8*  MCV 89.6  PLT 409   Basic Metabolic Panel Recent Labs    03/19/18 0404 03/20/18 0227  NA 128* 129*  K 4.0 4.0  CL 95* 93*  CO2 30  29  GLUCOSE 83 90  BUN 17 17  CREATININE 0.99 0.92  CALCIUM 8.2* 8.2*   Liver Function Tests Recent Labs    03/19/18 0848  AST 34  ALT 32  ALKPHOS 121  BILITOT 3.4*  PROT 7.9  ALBUMIN 2.0*   No results for input(s): LIPASE, AMYLASE in the last 72 hours. Cardiac Enzymes No results for input(s): CKTOTAL, CKMB, CKMBINDEX, TROPONINI in the last 72 hours.  BNP: BNP (last 3 results) Recent Labs    03/10/18 2026  BNP 2,304.5*    ProBNP (last 3 results) No results for input(s): PROBNP in the last 8760 hours.   D-Dimer No results for input(s): DDIMER in the last 72 hours. Hemoglobin A1C No results for input(s): HGBA1C in the last 72 hours. Fasting Lipid Panel No results for input(s): CHOL, HDL, LDLCALC, TRIG, CHOLHDL, LDLDIRECT in the last 72 hours. Thyroid Function Tests No results for input(s): TSH, T4TOTAL, T3FREE, THYROIDAB in the last 72 hours.  Invalid input(s): FREET3  Other results:   Imaging    Dg Abd 1 View  Result Date: 03/19/2018 CLINICAL DATA:  Abdomen pain with distension EXAM: ABDOMEN - 1 VIEW COMPARISON:  CT 04/23/2015, chest x-ray 03/10/2018 FINDINGS: Mild gaseous enlargement of the colon with rectal gas present. No  significant dilated small bowel. Moderate stool in the colon. No radiopaque calculi. IMPRESSION: Mild gaseous enlargement of the colon without definitive obstructive pattern. Moderate stool. Electronically Signed   By: Donavan Foil M.D.   On: 03/19/2018 23:47     Medications:     Scheduled Medications: . carvedilol  3.125 mg Oral BID WC  . docusate sodium  100 mg Oral BID  . enoxaparin (LOVENOX) injection  40 mg Subcutaneous Daily  . folic acid  1 mg Oral Daily  . Influenza vac split quadrivalent PF  0.5 mL Intramuscular Tomorrow-1000  . losartan  12.5 mg Oral Daily  . multivitamin with minerals  1 tablet Oral Daily  . polyethylene glycol  17 g Oral Once  . sodium chloride flush  10-40 mL Intracatheter Q12H  . spironolactone   12.5 mg Oral Daily  . torsemide  40 mg Oral Daily    Infusions:   PRN Medications: acetaminophen, bisacodyl, pneumococcal 23 valent vaccine, sodium chloride flush    Patient Profile   [name] is a 52 year old with a history of NICM,  Angioedema with ACE, anemia, HTN, cirrhosis,  OSA, and chronic systolic heart failure.   Admitted with massive anasarca and A/C systolic heart failure.    Assessment/Plan   1. Acute on chronic systolic CHF: NICM. Echo 03/11/18: EF 20-25% with severe LV dilation, severe RV dilation and dysfunction. Has had massive diuresis. Now down over 70 pounds. He is not volume overloaded today.  He had been off diuretics with low BP, now stabilized.    - Start torsemide 40 mg daily.   - Continue coreg 3.125 mg twice a day.  - Continue spironolactone 12.5 mg daily.  - Restart losartan 12.5 mg daily.  - Hydralazine/imdur to remain on hold for now. - No ACEi or ARNI with angioedema with ACEi 2. Abdominal Pain: HIDDA scan 03/12/18 with findings compatible with gallbladder dysfunction, no evidence of cystic duct or common bile duct obstruction.  He now had recurrent abdominal pain, now resolved after BM and likely due to constipation.  3. Cirrhosis: Liver with cirrhotic appearance on abdominal US. 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 Had moderate ascites. No change.  4. Hyponatremia: Mild. - Restrict free water 5. Anasarca: Albumin low at 1.9. 24 hour urine protein completed to assess for nephrotic syndrome => Urine protein < reportable range/24 hrs (not nephrotic syndrome). Anasarca resolved with diuresis.  6. Noncompliance: He will need close follow up. Need to identify barriers. He has trouble paying for medications. Will need to provide HF medications at DC.  7. Deconditioning: PT recommending SNF but he has no insurance.   Continue to work on mobility.  He is now on po meds and from my standpoint, could go when he can manage at home on his own.  He will need  to be given medications prior to discharge. He will need MATCH program if discharged. Consult care management.  Should get paramedicine.   Length of Stay: 10  Loralie Champagne, MD  03/20/2018, 7:59 AM  Advanced Heart Failure Team Pager [phone] (M-F; [place])  Please contact [facility] for night-coverage after hours (4p -7a ) and weekends on [email or web address]

## 2018-03-20 NOTE — Clinical Social Work Note (Addendum)
Patient refusing SNF. Patient has money to pay for a cab home. Offered clothing but patient requesting 2XL shirt and 44/46 pants. Only had XL shirt and 40/32 pants so he prefers to return home in scrubs. Nurse secretary aware.  CSW signing off. Consult again if any other social work needs arise.  Dayton Scrape, [place] [phone]  2:40 pm Cab voucher approved by Surveyor, quantity of social work. Per RN, patient now stating he cannot afford cab home because he had to pay for his medications. Shirt also given to RN.  CSW signing off.  Dayton Scrape, [place]

## 2018-03-20 NOTE — Progress Notes (Addendum)
RN provided blue paper scrubs to pt to wear home. SW provided flannel top for pt to stay warm and taxi voucher. RN called taxi to take pt home. Pt belongings with pt. Pt discharged via wheelchair. Pt is not in distress.

## 2018-03-20 NOTE — Care Management Note (Addendum)
Case Management Note  Patient Details  Name: Hamp Moreland MRN: [id number] Date of Birth: 02/19/1966  Action/Plan: Patient stated that he wants to go home at discharge; PCP: Dorena Dew, FNP; no medical insurance; Financial Counselor called to see the patient; he goes to the [facility] for his medication;  11:40 am - He does not qualify for the [employer] ( Medication Assistance Through [employer]);  Received call from the Financial Counselor, patient, he does not qualify for Medicaid due to large amount of finances in multiple accounts; he will have to do a spend down.; his medication will be filled through the [facility] at discharge; he will be able to pay for his medication. He is requesting clothes at discharge, he stated that the ER cut his clothes up. Judson Roch SW to assist in getting him clothing and a [employer] that the patient is agreeable to pay for.  Expected Discharge Date:  03/13/18               Expected Discharge Plan:  Home/Self Care  In-House Referral:   SW  Discharge planning Services  CM Consult  Status of Service:  In process, will continue to follow  Sherrilyn Rist [phone] 03/20/2018, 11:28 AM

## 2018-03-20 NOTE — Discharge Instructions (Signed)
Heart Failure °Heart failure means your heart has trouble pumping blood. This makes it hard for your body to work well. Heart failure is usually a long-term (chronic) condition. You must take good care of yourself and follow your doctor's treatment plan. °Follow these instructions at home: °· Take your heart medicine as told by your doctor. °? Do not stop taking medicine unless your doctor tells you to. °? Do not skip any dose of medicine. °? Refill your medicines before they run out. °? Take other medicines only as told by your doctor or pharmacist. °· Stay active if told by your doctor. The elderly and people with severe heart failure should talk with a doctor about physical activity. °· Eat heart-healthy foods. Choose foods that are without trans fat and are low in saturated fat, cholesterol, and salt (sodium). This includes fresh or frozen fruits and vegetables, fish, lean meats, fat-free or low-fat dairy foods, whole grains, and high-fiber foods. Lentils and dried peas and beans (legumes) are also good choices. °· Limit salt if told by your doctor. °· Cook in a healthy way. Roast, grill, broil, bake, poach, steam, or stir-fry foods. °· Limit fluids as told by your doctor. °· Weigh yourself every morning. Do this after you pee (urinate) and before you eat breakfast. Write down your weight to give to your doctor. °· Take your blood pressure and write it down if your doctor tells you to. °· Ask your doctor how to check your pulse. Check your pulse as told. °· Lose weight if told by your doctor. °· Stop smoking or chewing tobacco. Do not use gum or patches that help you quit without your doctor's approval. °· Schedule and go to doctor visits as told. °· Nonpregnant women should have no more than 1 drink a day. Men should have no more than 2 drinks a day. Talk to your doctor about drinking alcohol. °· Stop illegal drug use. °· Stay current with shots (immunizations). °· Manage your health conditions as told by your  doctor. °· Learn to manage your stress. °· Rest when you are tired. °· If it is really hot outside: °? Avoid intense activities. °? Use air conditioning or fans, or get in a cooler place. °? Avoid caffeine and alcohol. °? Wear loose-fitting, lightweight, and light-colored clothing. °· If it is really cold outside: °? Avoid intense activities. °? Layer your clothing. °? Wear mittens or gloves, a hat, and a scarf when going outside. °? Avoid alcohol. °· Learn about heart failure and get support as needed. °· Get help to maintain or improve your quality of life and your ability to care for yourself as needed. °Contact a doctor if: °· You gain weight quickly. °· You are more short of breath than usual. °· You cannot do your normal activities. °· You tire easily. °· You cough more than normal, especially with activity. °· You have any or more puffiness (swelling) in areas such as your hands, feet, ankles, or belly (abdomen). °· You cannot sleep because it is hard to breathe. °· You feel like your heart is beating fast (palpitations). °· You get dizzy or light-headed when you stand up. °Get help right away if: °· You have trouble breathing. °· There is a change in mental status, such as becoming less alert or not being able to focus. °· You have chest pain or discomfort. °· You faint. °This information is not intended to replace advice given to you by your health care provider. Make sure you   discuss any questions you have with your health care provider. °Document Released: 02/09/2008 Document Revised: 10/08/2015 Document Reviewed: 06/18/2012 °Elsevier Interactive Patient Education © 2017 [employer] ° °

## 2018-03-20 NOTE — Progress Notes (Signed)
Advanced Heart Failure CSW met with patient to complete assessment for outpatient Paramedicine program  Kyle Conrad is enrolled in the [employer] [facility] through [facility].    Social Determinants impacting successful heart failure regimen:  Housing: apartment- rents Food: Do you have enough food? yes Do you know and understand healthy eating and how that affects your heart failure diagnosis? States this has not been discussed yet. Do you follow a low salt diet?  Reports he will start following low salt diet at home. Utilities: Do you have gas and/or electricity on in your home? yes Income: What is your source of income? 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 Insurance: None- pt is aware of marketplace and has signed up before- plans to sign up following this admission for coverage. Transportation: Do you have transportation to your medical appointments?yes If yes, how? Owns his own vehicle and can drive.    Daily Health Needs: Do you have a scale and weigh each day?  Yes- hasn't been weighing daily- CSW informed patient of need to weigh daily to track any increases in weight which could be related to fluid overload. Are you able to adhere to your medication regimen? Yes Do you ever take medications differently than prescribed? No   Do you have any identified obstacles / challenges for adherence to current treatment plan? No concerns with barriers for adherence to treatment plan but expressed other concerns with current situation.   Patient reports that his keys were thrown away when he was brought into the ED- states he has spare key at his house but unsure how he will get to his home or to [employer] to get his car.  Patient also expresses concerns about clothing- his clothes were cut in the ED and now he has nothing to wear home.  [facility] will continue to  coordinate and monitor patient's treatment plan. CSW will continue to follow outpatient through heart failure clinic and paramedicine program.

## 2018-03-20 NOTE — Discharge Summary (Signed)
[facility]  Physician Discharge Summary   Patient ID: Kyle Conrad MRN: [id number] DOB/AGE: [birth year] 52 y.o.  Admit date: 03/10/2018 Discharge date: 03/20/2018  PCP: Dorena Dew, FNP  DISCHARGE DIAGNOSES:  Acute systolic CHF Acute respiratory failure with hypoxia, resolved Possible liver cirrhosis requiring outpatient monitoring Folate deficiency Medication noncompliance   RECOMMENDATIONS FOR OUTPATIENT FOLLOW UP: 1. Patient encouraged to keep follow-up appointments 2. 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 Patient stated he stopped taking his medication several months ago. He used to follow with Dr. Aundra Dubin, last seen by them 2 years ago. He was admitted as he was found to be in severe fluid overload and CHF exacerbation. Cardiology was consulted and patient was started on aggressive diuretics-Lasix drip. PICC line was placed on 03/12/2018. Spironolactone, hydralazine, Imdur and Losartan started.  Subsequently patient was noted to be hypotensive with some dizziness.  Cardiac medications were held.  He was given IV fluids.  Seems to be improved.    Reason for Visit: Acute systolic CHF  Consultants: Cardiology  Procedures:   Transthoracic echocardiogram Study Conclusions  - Left ventricle: The cavity size was severely dilated. Wall thickness was increased in a pattern of mild LVH. Systolic function was severely reduced. The estimated ejection fraction was in the range of 20% to 25%. - Aortic valve: There was mild regurgitation. - Mitral  valve: MR is eccentric and severe It is more posterior into LA - Left atrium: The atrium was moderately to severely dilated. - Right ventricle: The cavity size was severely dilated. Systolic function was severely reduced. - Right atrium: The atrium was severely dilated. - Tricuspid valve: TV leaflets do not coapt TR is severe . - Pericardium, extracardiac: A small pericardial effusion was identified. - Impressions: COmpared to report of echo from 2017, LVEF is worse. TR and MR are now severe RVEF was not reported at that time, now it is severely depressed.  Impressions:  - COmpared to report of echo from 2017, LVEF is worse. TR and MR are now severe RVEF was not reported at that time, now it is severely depressed.   HOSPITAL COURSE:   Acute respiratory failure with hypoxia This was secondary to CHF exacerbation.  Now improved with diuresis.  Saturating normal on room air.     Acute systolic and diastolic CHF/right ventricular failure EF noted to be 20 to 25% based on the most recent echocardiogram.  Severe mitral and tricuspid regurgitation noted on echocardiogram.    Heart failure team was consulted.  Patient was placed on IV diuretics and other cardiac medications including carvedilol hydralazine nitrates ARB and spironolactone.  He did have some episodes of hypotension.  He had to be given fluid boluses.  Hydralazine and nitrates were discontinued.  Patient to be discharged on torsemide, low-dose carvedilol, ARB and spironolactone.  Outpatient follow-up with cardiology.  Abdominal pain secondary to constipation Patient had bowel movements with improvement in his symptoms.  Laxatives as needed.    Possible liver cirrhosis/Hyperbilirubinemia Ultrasound of the abdomen did show moderate ascites and sludge and cholelithiasis with mildly thickened gallbladder.  Nodular appearing liver was noted as well.  HIDA  scan showed gallbladder dysfunction without any cystic  obstruction. Bilirubin has been elevated but stable.  Ultrasound did not reveal any other abnormalities.  INR 1.62.  Albumin remains low at 1.8.  Patient denies any significant alcohol intake.  His liver dysfunction and appearance could be due to right ventricular failure.  Will need continued monitoring in the outpatient setting.  Hepatitis panel is negative.  If he remains compliant with his heart failure treatment then he may need repeat studies as an outpatient and perhaps referral to Hepatology.  Normocytic anemia/Folate deficiency Hemoglobin is stable.  Anemia panel reviewed.  Folic acid noted to be 3.6.  This is being supplemented.  Vitamin B12 level 981.  Ferritin 208.  Generalized anasarca/hyponatremia Anasarca has improved.  Hyponatremia secondary to hypervolemia.    Medication noncompliance Importance of taking medications and follow-up emphasized to the patient.  Initially plan was to pursue skilled nursing facility placement.  However patient without any insurance.  He has made progress over the last few days.  He declines placement to skilled nursing facility and would rather go home.  Declines home health as well.  Discussed with Dr. Aundra Dubin this morning.  Medically he remained stable for discharge.  Will be discharged today.    PERTINENT LABS:  The results of significant diagnostics from this hospitalization (including imaging, microbiology, ancillary and laboratory) are listed below for reference.      Labs: Basic Metabolic Panel: Recent Labs  Lab 03/14/18 0357 03/15/18 0409 03/16/18 0411 03/17/18 0423 03/18/18 0420 03/19/18 0404 03/20/18 0227  NA 135 133* 129* 129* 129* 128* 129*  K 3.6 3.8 3.6 4.1 4.3 4.0 4.0  CL 99 95* 90* 90* 91* 95* 93*  CO2 29 33* 32 34* 29 30 29   GLUCOSE 97 84 95 81 76 83 90  BUN 12 14 15 17 20 17 17   CREATININE 0.93 1.00 0.99 1.07 1.13 0.99 0.92  CALCIUM 8.1* 8.1* 8.1* 8.2* 8.2* 8.2* 8.2*  MG 1.7 1.9 2.1  --   --   --   --    Liver  Function Tests: Recent Labs  Lab 03/14/18 1752 03/15/18 0409 03/16/18 0411 03/17/18 0423 03/19/18 0848  AST  --  24 65* 31 34  ALT  --  31 32 33 32  ALKPHOS  --  114 119 121 121  BILITOT 3.4* 3.5* 3.6* 3.4* 3.4*  PROT  --  6.3* 6.3* 6.3* 7.9  ALBUMIN  --  1.8* 1.8* 1.8* 2.0*   CBC: Recent Labs  Lab 03/17/18 0423 03/19/18 0848  WBC 3.3* 4.1  HGB 12.2* 13.1  HCT 36.3* 38.8*  MCV 88.5 89.6  PLT 167 215   BNP: BNP (last 3 results) Recent Labs    03/10/18 2026  BNP 2,304.5*     IMAGING STUDIES Dg Abd 1 View  Result Date: 03/19/2018 CLINICAL DATA:  Abdomen pain with distension EXAM: ABDOMEN - 1 VIEW COMPARISON:  CT 04/23/2015, chest x-ray 03/10/2018 FINDINGS: Mild gaseous enlargement of the colon with rectal gas present. No significant dilated small bowel. Moderate stool in the colon. No radiopaque calculi. IMPRESSION: Mild gaseous enlargement of the colon without definitive obstructive pattern. Moderate stool. Electronically Signed   By: Donavan Foil M.D.   On: 03/19/2018 23:47   Nm Hepato W/eject Fract  Result Date: 03/12/2018 CLINICAL DATA:  Gallstones and sludge. EXAM: NUCLEAR MEDICINE HEPATOBILIARY IMAGING WITH GALLBLADDER EF TECHNIQUE: Sequential images of the abdomen were obtained out to 60 minutes following intravenous administration of radiopharmaceutical. After oral ingestion  of Ensure, gallbladder ejection fraction was determined. At 60 min, normal ejection fraction is greater than 33%. RADIOPHARMACEUTICALS:  5 mCi Tc-18m  Choletec IV COMPARISON:  Right upper quadrant abdominal ultrasound of March 11, 2018 FINDINGS: Prompt uptake and biliary excretion of activity by the liver is seen. Gallbladder activity is visualized, consistent with patency of cystic duct. Biliary activity passes into small bowel, consistent with patent common bile duct. Calculated gallbladder ejection fraction is 19%%. (Normal gallbladder ejection fraction with Ensure is greater than 33%.)  IMPRESSION: Findings compatible with gallbladder dysfunction. No evidence of cystic duct or common bile duct obstruction. Electronically Signed   By: David  Martinique M.D.   On: 03/12/2018 13:27   Dg Chest Port 1 View  Result Date: 03/10/2018 CLINICAL DATA:  Shortness of breath EXAM: PORTABLE CHEST 1 VIEW COMPARISON:  Chest radiograph April 22, 2015 and chest CT April 23, 2015 FINDINGS: There is patchy opacity in the right base region. Lungs elsewhere are clear. There is cardiomegaly with pulmonary vascularity normal. No adenopathy. There is degenerative change in each shoulder. IMPRESSION: Patchy opacity right base concerning for pneumonia. Lungs elsewhere clear. There is cardiomegaly. No adenopathy evident. Electronically Signed   By: Lowella Grip III M.D.   On: 03/10/2018 21:54   [place] Ekg Site Rite  Result Date: 03/12/2018 If Site Rite image not attached, placement could not be confirmed due to current cardiac rhythm.  US Abdomen Limited Ruq  Result Date: 03/11/2018 CLINICAL DATA:  Elevated serum bilirubin EXAM: ULTRASOUND ABDOMEN LIMITED RIGHT UPPER QUADRANT COMPARISON:  None. FINDINGS: Gallbladder: Gallbladder is filled with sludge. In addition, there are intermingled gallstones, largest measuring 1 cm. Gallbladder wall is mildly thickened but does not appear edematous. There is no pericholecystic fluid. No sonographic Murphy sign noted by sonographer. Common bile duct: Diameter: 4 mm proximally. Distal common bile duct could not be visualized due to overlying gas. Liver: Liver has a nodular contour.  The liver echogenicity is normal. The portal vein is patent on color Doppler imaging with normal direction of blood flow towards the liver. There is moderate ascites.  There is also a right pleural effusion. IMPRESSION: 1.  Moderate ascites. 2. Sludge and cholelithiasis. Gallbladder wall is mildly thickened. This mild wall thickening may be due to ascites. Acute cholecystitis cannot be excluded,  however. In this regard, it may be reasonable to consider nuclear medicine hepatobiliary imaging study to assess for cystic duct patency. 3. Nodular liver contour, a finding indicative of hepatic cirrhosis. Electronically Signed   By: Lowella Grip III M.D.   On: 03/11/2018 01:09    DISCHARGE EXAMINATION: Vitals:   03/19/18 1215 03/19/18 1728 03/19/18 1929 03/20/18 0529  BP: (!) 87/68 107/73 109/74 97/68  Pulse: 80 79 77 85  Resp: 20   18  Temp: 98.1 F (36.7 C)  (!) 97.5 F (36.4 C) 98 F (36.7 C)  TempSrc: Oral  Oral   SpO2: 90%  100% 95%  Weight:    76.2 kg  Height:       General appearance: alert, cooperative, appears stated age and no distress Resp: clear to auscultation bilaterally Cardio: regular rate and rhythm, S1, S2 normal, no murmur, click, rub or gallop GI: soft, non-tender; bowel sounds normal; no masses,  no organomegaly  DISPOSITION: Home  Discharge Instructions    (HEART FAILURE PATIENTS) Call MD:  Anytime you have any of the following symptoms: 1) 3 pound weight gain in 24 hours or 5 pounds in 1 week 2) shortness of breath,  with or without a dry hacking cough 3) swelling in the hands, feet or stomach 4) if you have to sleep on extra pillows at night in order to breathe.   Complete by:  As directed    Call MD for:  difficulty breathing, headache or visual disturbances   Complete by:  As directed    Call MD for:  extreme fatigue   Complete by:  As directed    Call MD for:  persistant dizziness or light-headedness   Complete by:  As directed    Call MD for:  persistant nausea and vomiting   Complete by:  As directed    Call MD for:  severe uncontrolled pain   Complete by:  As directed    Call MD for:  temperature >100.4   Complete by:  As directed    Diet - low sodium heart healthy   Complete by:  As directed    Discharge instructions   Complete by:  As directed    Please take your medications as prescribed.  Please keep your follow-up  appointments.  You were cared for by a hospitalist during your hospital stay. If you have any questions about your discharge medications or the care you received while you were in the hospital after you are discharged, you can call the unit and asked to speak with the hospitalist on call if the hospitalist that took care of you is not available. Once you are discharged, your primary care physician will handle any further medical issues. Please note that NO REFILLS for any discharge medications will be authorized once you are discharged, as it is imperative that you return to your primary care physician (or establish a relationship with a primary care physician if you do not have one) for your aftercare needs so that they can reassess your need for medications and monitor your lab values. If you do not have a primary care physician, you can call [phone] for a physician referral.   Increase activity slowly   Complete by:  As directed         Allergies as of 03/20/2018      Reactions   Lisinopril Swelling   REACTION: pt had a swelling episode.  His lips swelled   Penicillins Hives   Has patient had a PCN reaction causing immediate rash, facial/tongue/throat swelling, SOB or lightheadedness with hypotension: Yes Has patient had a PCN reaction causing severe rash involving mucus membranes or skin necrosis: No Has patient had a PCN reaction that required hospitalization: No Has patient had a PCN reaction occurring within the last 10 years: No If all of the above answers are "NO", then may proceed with Cephalosporin use.      Medication List    STOP taking these medications   digoxin 0.125 MG tablet Commonly known as:  LANOXIN   furosemide 40 MG tablet Commonly known as:  LASIX   hydrALAZINE 100 MG tablet Commonly known as:  APRESOLINE   isosorbide mononitrate 120 MG 24 hr tablet Commonly known as:  IMDUR   omeprazole 20 MG capsule Commonly known as:  PRILOSEC   OVER THE COUNTER  MEDICATION     TAKE these medications   carvedilol 3.125 MG tablet Commonly known as:  COREG Take 1 tablet (3.125 mg total) by mouth 2 (two) times daily with a meal. What changed:    medication strength  how much to take  additional instructions   docusate sodium 100 MG capsule Commonly known as:  COLACE  Take 1 capsule (100 mg total) by mouth 2 (two) times daily.   folic acid 1 MG tablet Commonly known as:  FOLVITE Take 1 tablet (1 mg total) by mouth daily.   losartan 25 MG tablet Commonly known as:  COZAAR Take 0.5 tablets (12.5 mg total) by mouth daily.   multivitamin with minerals Tabs tablet Take 1 tablet by mouth daily.   polyethylene glycol packet Commonly known as:  MIRALAX / GLYCOLAX Take 17 g by mouth daily as needed.   spironolactone 25 MG tablet Commonly known as:  ALDACTONE Take 0.5 tablets (12.5 mg total) by mouth daily. What changed:    medication strength  how much to take  Another medication with the same name was removed. Continue taking this medication, and follow the directions you see here.   torsemide 20 MG tablet Commonly known as:  DEMADEX Take 2 tablets (40 mg total) by mouth daily.        Follow-up Information    [facility]. Go on 03/27/2018.   Specialty:  Cardiology Why:  at 11:00 AM in the [facility].  Please bring all medications to appt.  Gate code 1800 for the month of November. Contact information: [address] [id number] [place]       Dorena Dew, FNP.   Specialty:  Family Medicine Why:  Office will call Contact information: [place] [place] [address] [phone]           TOTAL DISCHARGE TIME: 52 minutes  Bonnielee Haff  [facility] Pager [phone]  03/20/2018, 12:56 PM

## 2018-03-20 NOTE — Progress Notes (Signed)
Pt discharge instructions reviewed with pt. Pt verbalizes understanding and states he has no questions. Pt belongings with pt. Pt is not in distress. Pt states he needs a taxi voucher for home. Social worker notified.

## 2018-03-20 NOTE — Progress Notes (Signed)
Pt states he needs a taxi voucher and clothes for home. RN called SW. SW states she will bring clothes for pt and she will speak to her leadership regarding taxi voucher since pt previously refused it.

## 2018-03-24 ENCOUNTER — Emergency Department (HOSPITAL_COMMUNITY): Payer: Self-pay | Admitting: Anesthesiology

## 2018-03-24 ENCOUNTER — Encounter (HOSPITAL_COMMUNITY): Payer: Self-pay | Admitting: Pharmacy Technician

## 2018-03-24 ENCOUNTER — Encounter (HOSPITAL_COMMUNITY): Admission: EM | Disposition: A | Discharge status: Home / Self Care | Payer: Self-pay | Source: Home / Self Care

## 2018-03-24 ENCOUNTER — Inpatient Hospital Stay (HOSPITAL_COMMUNITY): Payer: Self-pay

## 2018-03-24 ENCOUNTER — Other Ambulatory Visit: Payer: Self-pay

## 2018-03-24 ENCOUNTER — Emergency Department (HOSPITAL_COMMUNITY): Payer: Self-pay

## 2018-03-24 ENCOUNTER — Inpatient Hospital Stay (HOSPITAL_COMMUNITY)
Admission: EM | Admit: 2018-03-24 | Discharge: 2018-03-28 | DRG: 351 | Disposition: A | Discharge status: Home / Self Care | Payer: Self-pay | Attending: General Surgery | Admitting: General Surgery

## 2018-03-24 DIAGNOSIS — K4090 Unilateral inguinal hernia, without obstruction or gangrene, not specified as recurrent: Secondary | ICD-10-CM

## 2018-03-24 DIAGNOSIS — K56609 Unspecified intestinal obstruction, unspecified as to partial versus complete obstruction: Secondary | ICD-10-CM

## 2018-03-24 DIAGNOSIS — Z419 Encounter for procedure for purposes other than remedying health state, unspecified: Secondary | ICD-10-CM

## 2018-03-24 DIAGNOSIS — Z880 Allergy status to penicillin: Secondary | ICD-10-CM

## 2018-03-24 DIAGNOSIS — G4733 Obstructive sleep apnea (adult) (pediatric): Secondary | ICD-10-CM | POA: Diagnosis present

## 2018-03-24 DIAGNOSIS — Z0189 Encounter for other specified special examinations: Secondary | ICD-10-CM

## 2018-03-24 DIAGNOSIS — I110 Hypertensive heart disease with heart failure: Secondary | ICD-10-CM | POA: Diagnosis present

## 2018-03-24 DIAGNOSIS — D473 Essential (hemorrhagic) thrombocythemia: Secondary | ICD-10-CM | POA: Diagnosis present

## 2018-03-24 DIAGNOSIS — K7469 Other cirrhosis of liver: Secondary | ICD-10-CM | POA: Diagnosis present

## 2018-03-24 DIAGNOSIS — Z801 Family history of malignant neoplasm of trachea, bronchus and lung: Secondary | ICD-10-CM

## 2018-03-24 DIAGNOSIS — E876 Hypokalemia: Secondary | ICD-10-CM | POA: Diagnosis present

## 2018-03-24 DIAGNOSIS — E861 Hypovolemia: Secondary | ICD-10-CM | POA: Diagnosis not present

## 2018-03-24 DIAGNOSIS — K4030 Unilateral inguinal hernia, with obstruction, without gangrene, not specified as recurrent: Principal | ICD-10-CM | POA: Diagnosis present

## 2018-03-24 DIAGNOSIS — I5022 Chronic systolic (congestive) heart failure: Secondary | ICD-10-CM | POA: Diagnosis present

## 2018-03-24 DIAGNOSIS — R188 Other ascites: Secondary | ICD-10-CM | POA: Diagnosis present

## 2018-03-24 DIAGNOSIS — R112 Nausea with vomiting, unspecified: Secondary | ICD-10-CM

## 2018-03-24 DIAGNOSIS — Z888 Allergy status to other drugs, medicaments and biological substances status: Secondary | ICD-10-CM

## 2018-03-24 DIAGNOSIS — Z79899 Other long term (current) drug therapy: Secondary | ICD-10-CM

## 2018-03-24 DIAGNOSIS — R1084 Generalized abdominal pain: Secondary | ICD-10-CM

## 2018-03-24 DIAGNOSIS — I428 Other cardiomyopathies: Secondary | ICD-10-CM | POA: Diagnosis present

## 2018-03-24 DIAGNOSIS — I493 Ventricular premature depolarization: Secondary | ICD-10-CM | POA: Diagnosis not present

## 2018-03-24 DIAGNOSIS — E871 Hypo-osmolality and hyponatremia: Secondary | ICD-10-CM | POA: Diagnosis present

## 2018-03-24 DIAGNOSIS — I10 Essential (primary) hypertension: Secondary | ICD-10-CM | POA: Diagnosis present

## 2018-03-24 HISTORY — PX: INGUINAL HERNIA REPAIR: SHX194

## 2018-03-24 HISTORY — PX: INSERTION OF MESH: SHX5868

## 2018-03-24 LAB — CBC WITH DIFFERENTIAL/PLATELET
ABS IMMATURE GRANULOCYTES: 0.02 10*3/uL (ref 0.00–0.07)
BASOS PCT: 0 %
Basophils Absolute: 0 10*3/uL (ref 0.0–0.1)
EOS ABS: 0 10*3/uL (ref 0.0–0.5)
Eosinophils Relative: 1 %
HCT: 43.7 % (ref 39.0–52.0)
Hemoglobin: 14.6 g/dL (ref 13.0–17.0)
Immature Granulocytes: 0 %
Lymphocytes Relative: 23 %
Lymphs Abs: 1.3 10*3/uL (ref 0.7–4.0)
MCH: 30.5 pg (ref 26.0–34.0)
MCHC: 33.4 g/dL (ref 30.0–36.0)
MCV: 91.2 fL (ref 80.0–100.0)
MONO ABS: 0.6 10*3/uL (ref 0.1–1.0)
MONOS PCT: 10 %
NEUTROS ABS: 3.7 10*3/uL (ref 1.7–7.7)
Neutrophils Relative %: 66 %
Platelets: 414 10*3/uL — ABNORMAL HIGH (ref 150–400)
RBC: 4.79 MIL/uL (ref 4.22–5.81)
RDW: 15.6 % — AB (ref 11.5–15.5)
WBC: 5.7 10*3/uL (ref 4.0–10.5)
nRBC: 0 % (ref 0.0–0.2)

## 2018-03-24 LAB — URINALYSIS, ROUTINE W REFLEX MICROSCOPIC
BILIRUBIN URINE: NEGATIVE
Bacteria, UA: NONE SEEN
GLUCOSE, UA: NEGATIVE mg/dL
Hgb urine dipstick: NEGATIVE
KETONES UR: 5 mg/dL — AB
Leukocytes, UA: NEGATIVE
NITRITE: NEGATIVE
PH: 8 (ref 5.0–8.0)
Protein, ur: 30 mg/dL — AB
Specific Gravity, Urine: 1.014 (ref 1.005–1.030)

## 2018-03-24 LAB — COMPREHENSIVE METABOLIC PANEL
ALT: 32 U/L (ref 0–44)
ANION GAP: 13 (ref 5–15)
AST: 34 U/L (ref 15–41)
Albumin: 2.4 g/dL — ABNORMAL LOW (ref 3.5–5.0)
Alkaline Phosphatase: 129 U/L — ABNORMAL HIGH (ref 38–126)
BUN: 20 mg/dL (ref 6–20)
CO2: 30 mmol/L (ref 22–32)
CREATININE: 1.22 mg/dL (ref 0.61–1.24)
Calcium: 9 mg/dL (ref 8.9–10.3)
Chloride: 89 mmol/L — ABNORMAL LOW (ref 98–111)
Glucose, Bld: 95 mg/dL (ref 70–99)
POTASSIUM: 3.5 mmol/L (ref 3.5–5.1)
SODIUM: 132 mmol/L — AB (ref 135–145)
Total Bilirubin: 3.8 mg/dL — ABNORMAL HIGH (ref 0.3–1.2)
Total Protein: 8.1 g/dL (ref 6.5–8.1)

## 2018-03-24 LAB — I-STAT CG4 LACTIC ACID, ED
LACTIC ACID, VENOUS: 2.26 mmol/L — AB (ref 0.5–1.9)
LACTIC ACID, VENOUS: 1.47 mmol/L (ref 0.5–1.9)

## 2018-03-24 LAB — PROTIME-INR
INR: 1.37
PROTHROMBIN TIME: 16.8 s — AB (ref 11.4–15.2)

## 2018-03-24 LAB — AMMONIA: Ammonia: 25 umol/L (ref 9–35)

## 2018-03-24 LAB — LIPASE, BLOOD: LIPASE: 34 U/L (ref 11–51)

## 2018-03-24 LAB — BRAIN NATRIURETIC PEPTIDE: B Natriuretic Peptide: 2471.9 pg/mL — ABNORMAL HIGH (ref 0.0–100.0)

## 2018-03-24 SURGERY — REPAIR, HERNIA, INGUINAL, INCARCERATED
Anesthesia: General | Site: Abdomen | Laterality: Left

## 2018-03-24 MED ORDER — SUGAMMADEX SODIUM 200 MG/2ML IV SOLN
INTRAVENOUS | Status: DC | PRN
  Administered 2018-03-24: 200 mg via INTRAVENOUS

## 2018-03-24 MED ORDER — MIDAZOLAM HCL 2 MG/2ML IJ SOLN
INTRAMUSCULAR | Status: DC | PRN
  Administered 2018-03-24: 2 mg via INTRAVENOUS

## 2018-03-24 MED ORDER — SUCCINYLCHOLINE CHLORIDE 200 MG/10ML IV SOSY
PREFILLED_SYRINGE | INTRAVENOUS | Status: AC
  Filled 2018-03-24: qty 10

## 2018-03-24 MED ORDER — ETOMIDATE 2 MG/ML IV SOLN
INTRAVENOUS | Status: DC | PRN
  Administered 2018-03-24: 10 mg via INTRAVENOUS

## 2018-03-24 MED ORDER — MEPERIDINE HCL 50 MG/ML IJ SOLN: 6.2500 mg | INTRAMUSCULAR | Status: DC | PRN

## 2018-03-24 MED ORDER — ALBUMIN HUMAN 5 % IV SOLN
INTRAVENOUS | Status: DC | PRN
  Administered 2018-03-24 (×2): via INTRAVENOUS

## 2018-03-24 MED ORDER — DEXAMETHASONE SODIUM PHOSPHATE 10 MG/ML IJ SOLN
INTRAMUSCULAR | Status: DC | PRN
  Administered 2018-03-24: 10 mg via INTRAVENOUS

## 2018-03-24 MED ORDER — PROMETHAZINE HCL 25 MG/ML IJ SOLN: 6.2500 mg | INTRAMUSCULAR | Status: DC | PRN

## 2018-03-24 MED ORDER — ONDANSETRON HCL 4 MG/2ML IJ SOLN
INTRAMUSCULAR | Status: DC | PRN
  Administered 2018-03-24: 4 mg via INTRAVENOUS

## 2018-03-24 MED ORDER — DIPHENHYDRAMINE HCL 50 MG/ML IJ SOLN: 25.0000 mg | Freq: Four times a day (QID) | INTRAMUSCULAR | Status: DC | PRN

## 2018-03-24 MED ORDER — LIDOCAINE HCL (CARDIAC) PF 100 MG/5ML IV SOSY
PREFILLED_SYRINGE | INTRAVENOUS | Status: DC | PRN
  Administered 2018-03-24: 50 mg via INTRATRACHEAL

## 2018-03-24 MED ORDER — HYDRALAZINE HCL 20 MG/ML IJ SOLN: 10.0000 mg | INTRAMUSCULAR | Status: DC | PRN

## 2018-03-24 MED ORDER — SUCCINYLCHOLINE 20MG/ML (10ML) SYRINGE FOR MEDFUSION PUMP - OPTIME
INTRAMUSCULAR | Status: DC | PRN
  Administered 2018-03-24: 100 mg via INTRAVENOUS

## 2018-03-24 MED ORDER — PROPOFOL 10 MG/ML IV BOLUS
INTRAVENOUS | Status: AC
  Filled 2018-03-24: qty 20

## 2018-03-24 MED ORDER — ROCURONIUM BROMIDE 50 MG/5ML IV SOSY
PREFILLED_SYRINGE | INTRAVENOUS | Status: AC
  Filled 2018-03-24: qty 5

## 2018-03-24 MED ORDER — MORPHINE SULFATE (PF) 4 MG/ML IV SOLN
4.0000 mg | Freq: Once | INTRAVENOUS | Status: AC
  Administered 2018-03-24: 4 mg via INTRAVENOUS
  Filled 2018-03-24: qty 1

## 2018-03-24 MED ORDER — ONDANSETRON HCL 4 MG/2ML IJ SOLN: 4.0000 mg | Freq: Four times a day (QID) | INTRAMUSCULAR | Status: DC | PRN

## 2018-03-24 MED ORDER — PANTOPRAZOLE SODIUM 40 MG IV SOLR
40.0000 mg | Freq: Every day | INTRAVENOUS | Status: DC
  Administered 2018-03-24 – 2018-03-26 (×3): 40 mg via INTRAVENOUS
  Filled 2018-03-24 (×3): qty 40

## 2018-03-24 MED ORDER — IOHEXOL 300 MG/ML  SOLN
100.0000 mL | Freq: Once | INTRAMUSCULAR | Status: AC | PRN
  Administered 2018-03-24: 100 mL via INTRAVENOUS

## 2018-03-24 MED ORDER — FENTANYL CITRATE (PF) 100 MCG/2ML IJ SOLN: 25.0000 ug | INTRAMUSCULAR | Status: DC | PRN

## 2018-03-24 MED ORDER — SODIUM CHLORIDE 0.9 % IV SOLN
INTRAVENOUS | Status: DC | PRN
  Administered 2018-03-24: 19:00:00 via INTRAVENOUS

## 2018-03-24 MED ORDER — PHENYLEPHRINE HCL 10 MG/ML IJ SOLN
INTRAMUSCULAR | Status: DC | PRN
  Administered 2018-03-24: 120 ug via INTRAVENOUS
  Administered 2018-03-24 (×3): 80 ug via INTRAVENOUS

## 2018-03-24 MED ORDER — ALBUMIN HUMAN 5 % IV SOLN: INTRAVENOUS | Status: DC | PRN

## 2018-03-24 MED ORDER — ONDANSETRON HCL 4 MG/2ML IJ SOLN
4.0000 mg | Freq: Once | INTRAMUSCULAR | Status: AC
  Administered 2018-03-24: 4 mg via INTRAVENOUS
  Filled 2018-03-24: qty 2

## 2018-03-24 MED ORDER — DIPHENHYDRAMINE HCL 25 MG PO CAPS
25.0000 mg | ORAL_CAPSULE | Freq: Four times a day (QID) | ORAL | Status: DC | PRN
  Administered 2018-03-26: 25 mg via ORAL
  Filled 2018-03-24 (×2): qty 1

## 2018-03-24 MED ORDER — LACTATED RINGERS IV SOLN: INTRAVENOUS | Status: DC

## 2018-03-24 MED ORDER — KCL IN DEXTROSE-NACL 20-5-0.45 MEQ/L-%-% IV SOLN
INTRAVENOUS | Status: DC
  Administered 2018-03-24 – 2018-03-25 (×2): via INTRAVENOUS
  Filled 2018-03-24 (×2): qty 1000

## 2018-03-24 MED ORDER — BUPIVACAINE-EPINEPHRINE (PF) 0.25% -1:200000 IJ SOLN
INTRAMUSCULAR | Status: AC
  Filled 2018-03-24: qty 30

## 2018-03-24 MED ORDER — CIPROFLOXACIN IN D5W 400 MG/200ML IV SOLN
400.0000 mg | Freq: Once | INTRAVENOUS | Status: AC
  Administered 2018-03-24: 400 mg via INTRAVENOUS
  Filled 2018-03-24: qty 200

## 2018-03-24 MED ORDER — FENTANYL CITRATE (PF) 250 MCG/5ML IJ SOLN
INTRAMUSCULAR | Status: AC
  Filled 2018-03-24: qty 5

## 2018-03-24 MED ORDER — MIDAZOLAM HCL 2 MG/2ML IJ SOLN
INTRAMUSCULAR | Status: AC
  Filled 2018-03-24: qty 2

## 2018-03-24 MED ORDER — LIDOCAINE 2% (20 MG/ML) 5 ML SYRINGE
INTRAMUSCULAR | Status: AC
  Filled 2018-03-24: qty 5

## 2018-03-24 MED ORDER — FENTANYL CITRATE (PF) 250 MCG/5ML IJ SOLN
INTRAMUSCULAR | Status: DC | PRN
  Administered 2018-03-24: 100 ug via INTRAVENOUS

## 2018-03-24 MED ORDER — ROCURONIUM 10MG/ML (10ML) SYRINGE FOR MEDFUSION PUMP - OPTIME
INTRAVENOUS | Status: DC | PRN
  Administered 2018-03-24: 50 mg via INTRAVENOUS

## 2018-03-24 MED ORDER — BUPIVACAINE-EPINEPHRINE 0.25% -1:200000 IJ SOLN
INTRAMUSCULAR | Status: DC | PRN
  Administered 2018-03-24: 10 mL

## 2018-03-24 MED ORDER — MORPHINE SULFATE (PF) 2 MG/ML IV SOLN
2.0000 mg | INTRAVENOUS | Status: DC | PRN
  Administered 2018-03-25: 4 mg via INTRAVENOUS
  Filled 2018-03-24: qty 2

## 2018-03-24 MED ORDER — METHOCARBAMOL 1000 MG/10ML IJ SOLN
1000.0000 mg | Freq: Three times a day (TID) | INTRAVENOUS | Status: DC | PRN
  Filled 2018-03-24: qty 10

## 2018-03-24 MED ORDER — ONDANSETRON 4 MG PO TBDP: 4.0000 mg | ORAL_TABLET | Freq: Four times a day (QID) | ORAL | Status: DC | PRN

## 2018-03-24 MED ORDER — ENOXAPARIN SODIUM 40 MG/0.4ML ~~LOC~~ SOLN
40.0000 mg | SUBCUTANEOUS | Status: DC
  Administered 2018-03-25 – 2018-03-28 (×4): 40 mg via SUBCUTANEOUS
  Filled 2018-03-24 (×4): qty 0.4

## 2018-03-24 SURGICAL SUPPLY — 44 items
BLADE CLIPPER SURG (BLADE) ×3 IMPLANT
CANISTER SUCT 3000ML PPV (MISCELLANEOUS) IMPLANT
CHLORAPREP W/TINT 26ML (MISCELLANEOUS) ×3 IMPLANT
COVER SURGICAL LIGHT HANDLE (MISCELLANEOUS) ×3 IMPLANT
COVER WAND RF STERILE (DRAPES) ×3 IMPLANT
DECANTER SPIKE VIAL GLASS SM (MISCELLANEOUS) ×3 IMPLANT
DERMABOND ADVANCED (GAUZE/BANDAGES/DRESSINGS) ×2
DERMABOND ADVANCED .7 DNX12 (GAUZE/BANDAGES/DRESSINGS) ×1 IMPLANT
DRAIN PENROSE 1/2X12 LTX STRL (WOUND CARE) ×3 IMPLANT
DRAPE LAPAROSCOPIC ABDOMINAL (DRAPES) ×3 IMPLANT
DRAPE UTILITY XL STRL (DRAPES) ×6 IMPLANT
ELECT CAUTERY BLADE 6.4 (BLADE) ×3 IMPLANT
ELECT REM PT RETURN 9FT ADLT (ELECTROSURGICAL) ×3
ELECTRODE REM PT RTRN 9FT ADLT (ELECTROSURGICAL) ×1 IMPLANT
GLOVE BIO SURGEON STRL SZ8 (GLOVE) ×9 IMPLANT
GLOVE BIOGEL PI IND STRL 8 (GLOVE) ×2 IMPLANT
GLOVE BIOGEL PI INDICATOR 8 (GLOVE) ×4
GOWN STRL REUS W/ TWL LRG LVL3 (GOWN DISPOSABLE) ×2 IMPLANT
GOWN STRL REUS W/ TWL XL LVL3 (GOWN DISPOSABLE) ×1 IMPLANT
GOWN STRL REUS W/TWL LRG LVL3 (GOWN DISPOSABLE) ×4
GOWN STRL REUS W/TWL XL LVL3 (GOWN DISPOSABLE) ×2
KIT BASIN OR (CUSTOM PROCEDURE TRAY) ×3 IMPLANT
KIT TURNOVER KIT B (KITS) ×3 IMPLANT
MESH HERNIA 3X6 (Mesh General) ×3 IMPLANT
NEEDLE 22X1 1/2 (OR ONLY) (NEEDLE) ×3 IMPLANT
NS IRRIG 1000ML POUR BTL (IV SOLUTION) ×3 IMPLANT
PACK SURGICAL SETUP 50X90 (CUSTOM PROCEDURE TRAY) ×3 IMPLANT
PAD ARMBOARD 7.5X6 YLW CONV (MISCELLANEOUS) ×3 IMPLANT
PENCIL BUTTON HOLSTER BLD 10FT (ELECTRODE) ×3 IMPLANT
SPONGE LAP 18X18 X RAY DECT (DISPOSABLE) ×3 IMPLANT
SUT MNCRL AB 4-0 PS2 18 (SUTURE) ×6 IMPLANT
SUT PROLENE 2 0 CT2 30 (SUTURE) ×9 IMPLANT
SUT VIC AB 2-0 SH 27 (SUTURE) ×2
SUT VIC AB 2-0 SH 27X BRD (SUTURE) ×1 IMPLANT
SUT VIC AB 3-0 SH 27 (SUTURE) ×2
SUT VIC AB 3-0 SH 27X BRD (SUTURE) ×1 IMPLANT
SUT VICRYL AB 3 0 TIES (SUTURE) IMPLANT
SYR BULB 3OZ (MISCELLANEOUS) ×3 IMPLANT
SYR CONTROL 10ML LL (SYRINGE) ×3 IMPLANT
TOWEL OR 17X24 6PK STRL BLUE (TOWEL DISPOSABLE) ×3 IMPLANT
TOWEL OR 17X26 10 PK STRL BLUE (TOWEL DISPOSABLE) ×3 IMPLANT
TUBE CONNECTING 12'X1/4 (SUCTIONS)
TUBE CONNECTING 12X1/4 (SUCTIONS) IMPLANT
YANKAUER SUCT BULB TIP NO VENT (SUCTIONS) ×3 IMPLANT

## 2018-03-24 NOTE — ED Provider Notes (Signed)
[facility] EMERGENCY DEPARTMENT Provider Note   CSN: [id number] Arrival date & time: 03/24/18  1418     History   Chief Complaint Chief Complaint  Patient presents with  . Abdominal Pain    HPI Kyle Conrad is a 52 y.o. male.  The history is provided by the patient and medical records. No language interpreter was used.  Abdominal Pain   This is a new problem. The current episode started more than 2 days ago. The problem occurs constantly. The problem has been gradually worsening. The pain is associated with an unknown factor. The pain is located in the suprapubic region and generalized abdominal region (ingiunal). The quality of the pain is pressure-like and aching. The pain is at a severity of 8/10. The pain is severe. Associated symptoms include flatus, nausea, vomiting, constipation and dysuria. Pertinent negatives include fever, diarrhea, frequency and headaches. The symptoms are aggravated by palpation. Nothing relieves the symptoms.    Past Medical History:  Diagnosis Date  . CHF (congestive heart failure) ([place])   . Hypertension     Patient Active Problem List   Diagnosis Date Noted  . Pressure injury of skin 03/18/2018  . Acute hypoxemic respiratory failure ([place]) 03/11/2018  . Chest pain 03/11/2018  . Hyperbilirubinemia 03/11/2018  . Acute exacerbation of CHF (congestive heart failure) ([place]) 03/10/2018  . HTN (hypertension) 08/10/2015  . Hypoalbuminemia 05/19/2015  . Anasarca   . Acute congestive heart failure ([place]) 04/22/2015  . Chronic anemia 04/22/2015  . Obstructive sleep apnea 12/15/2008  . FATIGUE / MALAISE 10/23/2008  . DYSPNEA 10/23/2008  . SYSTOLIC HEART FAILURE, CHRONIC 09/25/2008  . Overweight 09/18/2008  . VENTRICULAR TACHYCARDIA 09/18/2008    Past Surgical History:  Procedure Laterality Date  . CARDIAC CATHETERIZATION N/A 04/30/2015   Procedure: Right/Left Heart Cath and Coronary Angiography;  Surgeon: Larey Dresser, MD;   Location: [facility];  Service: Cardiovascular;  Laterality: N/A;  . COLONOSCOPY WITH PROPOFOL N/A 05/01/2015   Procedure: COLONOSCOPY WITH PROPOFOL;  Surgeon: Wilford Corner, MD;  Location: [facility] ENDOSCOPY;  Service: Endoscopy;  Laterality: N/A;  . ESOPHAGOGASTRODUODENOSCOPY (EGD) WITH PROPOFOL N/A 05/01/2015   Procedure: ESOPHAGOGASTRODUODENOSCOPY (EGD) WITH PROPOFOL;  Surgeon: Wilford Corner, MD;  Location[facility] ENDOSCOPY;  Service: Endoscopy;  Laterality: N/A;  . SHOULDER SURGERY          Home Medications    Prior to Admission medications   Medication Sig Start Date End Date Taking? Authorizing Provider  carvedilol (COREG) 3.125 MG tablet Take 1 tablet (3.125 mg total) by mouth 2 (two) times daily with a meal. 03/20/18   Bonnielee Haff, MD  docusate sodium (COLACE) 100 MG capsule Take 1 capsule (100 mg total) by mouth 2 (two) times daily. 03/20/18   Bonnielee Haff, MD  folic acid (FOLVITE) 1 MG tablet Take 1 tablet (1 mg total) by mouth daily. 03/20/18   Bonnielee Haff, MD  losartan (COZAAR) 25 MG tablet Take 0.5 tablets (12.5 mg total) by mouth daily. 03/20/18   Bonnielee Haff, MD  Multiple Vitamin (MULTIVITAMIN WITH MINERALS) TABS tablet Take 1 tablet by mouth daily. 03/20/18   Bonnielee Haff, MD  polyethylene glycol [facility] / Floria Raveling) packet Take 17 g by mouth daily as needed. 03/20/18   Bonnielee Haff, MD  spironolactone (ALDACTONE) 25 MG tablet Take 0.5 tablets (12.5 mg total) by mouth daily. 03/20/18   Bonnielee Haff, MD  torsemide (DEMADEX) 20 MG tablet Take 2 tablets (40 mg total) by mouth daily. 03/20/18   [name],  Gokul, MD    Family History Family History  Problem Relation Age of Onset  . Lung cancer Mother     Social History Social History   Tobacco Use  . Smoking status: Never Smoker  . Smokeless tobacco: Never Used  Substance Use Topics  . Alcohol use: No  . Drug use: No     Allergies   Lisinopril and Penicillins   Review of Systems Review  of Systems  Constitutional: Negative for chills, diaphoresis, fatigue and fever.  HENT: Negative for congestion.   Respiratory: Negative for cough, chest tightness, shortness of breath and wheezing.   Cardiovascular: Negative for chest pain and palpitations.  Gastrointestinal: Positive for abdominal pain, constipation, flatus, nausea and vomiting. Negative for abdominal distention and diarrhea.  Genitourinary: Positive for dysuria and scrotal swelling. Negative for decreased urine volume and frequency.  Musculoskeletal: Negative for back pain, neck pain and neck stiffness.  Skin: Negative for rash and wound.  Neurological: Negative for light-headedness and headaches.  Psychiatric/Behavioral: Negative for agitation and confusion.     Physical Exam Updated Vital Signs BP 106/85   Pulse 98   Temp (!) 97.5 F (36.4 C) (Oral)   Resp (!) 21   Ht 6' (1.829 m)   SpO2 100%   BMI 22.78 kg/m   Physical Exam  Constitutional: He appears well-developed and well-nourished.  Non-toxic appearance. He does not appear ill. No distress.  HENT:  Head: Normocephalic and atraumatic.  Eyes: Pupils are equal, round, and reactive to light. Conjunctivae and EOM are normal.  Neck: Neck supple.  Cardiovascular: Normal rate and regular rhythm.  No murmur heard. Pulmonary/Chest: Effort normal and breath sounds normal. No respiratory distress.  Abdominal: Soft. Bowel sounds are normal. There is generalized tenderness. There is no rigidity, no rebound and no CVA tenderness. A hernia is present. Hernia confirmed positive in the left inguinal area.  Genitourinary: Penis normal. Right testis shows no tenderness. Left testis shows no tenderness.  Genitourinary Comments: Swelling and tenderness in the left inguinal and groin area.  Testicles nontender.  Penis normal.  Musculoskeletal: He exhibits no edema.  Neurological: He is alert.  Skin: Skin is warm and dry. Capillary refill takes less than 2 seconds. No  erythema.  Psychiatric: He has a normal mood and affect.  Nursing note and vitals reviewed.    ED Treatments / Results  Labs (all labs ordered are listed, but only abnormal results are displayed) Labs Reviewed  CBC WITH DIFFERENTIAL/PLATELET - Abnormal; Notable for the following components:      Result Value   RDW 15.6 (*)    Platelets 414 (*)    All other components within normal limits  COMPREHENSIVE METABOLIC PANEL - Abnormal; Notable for the following components:   Sodium 132 (*)    Chloride 89 (*)    Albumin 2.4 (*)    Alkaline Phosphatase 129 (*)    Total Bilirubin 3.8 (*)    All other components within normal limits  PROTIME-INR - Abnormal; Notable for the following components:   Prothrombin Time 16.8 (*)    All other components within normal limits  URINALYSIS, ROUTINE W REFLEX MICROSCOPIC - Abnormal; Notable for the following components:   Color, Urine AMBER (*)    Ketones, ur 5 (*)    Protein, ur 30 (*)    All other components within normal limits  BRAIN NATRIURETIC PEPTIDE - Abnormal; Notable for the following components:   B Natriuretic Peptide 2,471.9 (*)    All other components within  normal limits  I-STAT CG4 LACTIC ACID, ED - Abnormal; Notable for the following components:   Lactic Acid, Venous 2.26 (*)    All other components within normal limits  URINE CULTURE  LIPASE, BLOOD  AMMONIA  I-STAT CG4 LACTIC ACID, ED    EKG EKG Interpretation  Date/Time:  Saturday March 24 2018 14:27:41 EST Ventricular Rate:  95 PR Interval:    QRS Duration: 94 QT Interval:  392 QTC Calculation: 493 R Axis:   66 Text Interpretation:  Sinus rhythm Atrial premature complex Borderline low voltage, extremity leads Repol abnrm suggests ischemia, lateral leads when compared to prior, deeper t wave inversionin leads V5 and V6 from prior.  No STEMI Confirmed by Antony Blackbird [phone]) on 03/24/2018 2:32:43 PM   Radiology Dg Chest 2 View  Result Date: 03/24/2018 CLINICAL  DATA:  Pt states he has a hernia, pain in upper abdomen x 4 days. Order comments state reason for exam is crackles on lung exam. Pt denies CP. Pt states nausea and vomiting all night. Pt states hx CHF. Nonsmoker EXAM: CHEST - 2 VIEW COMPARISON:  03/10/2018 FINDINGS: Mild cardiomegaly with right heart prominence. Thoracic spondylosis. Degenerative glenohumeral arthropathy bilaterally. The lungs appear clear. IMPRESSION: 1. Stable mild right heart enlargement.  No edema. 2. Thoracic spondylosis. 3. Bilateral glenohumeral arthropathy. Electronically Signed   By: Van Clines M.D.   On: 03/24/2018 15:45   Ct Abdomen Pelvis W Contrast  Result Date: 03/24/2018 CLINICAL DATA:  Diffuse abdominal pain with nausea and vomiting. Scrotal pain for the last few days. EXAM: CT ABDOMEN AND PELVIS WITH CONTRAST TECHNIQUE: Multidetector CT imaging of the abdomen and pelvis was performed using the standard protocol following bolus administration of intravenous contrast. CONTRAST:  112mL OMNIPAQUE IOHEXOL 300 MG/ML  SOLN COMPARISON:  04/23/2015 FINDINGS: Lower chest: Mild cardiomegaly. Incidental lipoma along the right serratus anterior muscle partially included on imaging on today's exam. Hepatobiliary: Unremarkable Pancreas: Moderate pancreatic atrophy for age, otherwise unremarkable. Spleen: Unremarkable Adrenals/Urinary Tract: Adrenal glands normal. Two hypodense lesions in the left kidney are likely cysts but technically too small to characterize. Asymmetric delay in excretion of the left kidney compared to the right, best seen on the delayed images. 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 There is also early opacification of the left gonadal vein indicating collateralization. 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 Stomach/Bowel: Distended stomach  and small bowel loops. The small bowel loops are dilated up to 3.9 cm. Dilated loops extend down to a indirect left inguinal hernia which contains 1-2 loops of small bowel as well as a fairly large hydrocele. The small bowel distal to this hernia is of normal caliber. No obvious extraluminal gas. 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 Mild aortoiliac atherosclerotic vascular disease. Reproductive: The prostate gland appears unremarkable. The left hemiscrotum is swollen by the hernia and adjacent fluid. Other: Small to moderate amount of ascites in the pelvis, paracolic gutters, and perihepatic region. No free air. Mild diffuse subcutaneous edema. Musculoskeletal: Calcifications along the anterior margins of the pubic bones in the vicinity of the adductor aponeurosis, significance uncertain although presumably this could be due to prior sports hernias. Speckled lucencies in both iliac bones, slightly greater on the right than the left, but given the symmetry this is probably incidental. IMPRESSION: 1. Distal small bowel obstruction due to herniation  of a loop of ileum into an indirect left inguinal hernia. The afferent loop is dilated as are all the more proximal loops of small bowel. There is a fluid collection in the hernia sac compatible with hydrocele. 2. Ascites with subcutaneous edema suggesting third spacing of fluid. No free intraperitoneal gas. 3. 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 Abnormally reduced SMA-aortic angle at 21 degrees and early opacification of the left gonadal vein due to collateralization are ancillary findings. 4. Mild cardiomegaly. 5.  Aortic Atherosclerosis (ICD10-I70.0). 6. Calcifications along the anterior pubis in the vicinity of the abductor aponeurosis, possibly due to remote athletic pubalgia. Electronically Signed    By: Van Clines M.D.   On: 03/24/2018 17:24    Procedures Procedures (including critical care time)  CRITICAL CARE Performed by: Gwenyth Allegra Tashanti Dalporto Total critical care time: 35 minutes Critical care time was exclusive of separately billable procedures and treating other patients. Critical care was necessary to treat or prevent imminent or life-threatening deterioration. Critical care was time spent personally by me on the following activities: development of treatment plan with patient and/or surrogate as well as nursing, discussions with consultants, evaluation of patient's response to treatment, examination of patient, obtaining history from patient or surrogate, ordering and performing treatments and interventions, ordering and review of laboratory studies, ordering and review of radiographic studies, pulse oximetry and re-evaluation of patient's condition.   Medications Ordered in ED Medications  lactated ringers infusion (has no administration in time range)  meperidine (DEMEROL) injection 6.25-12.5 mg (has no administration in time range)  promethazine (PHENERGAN) injection 6.25-12.5 mg (has no administration in time range)  fentaNYL (SUBLIMAZE) injection 25-50 mcg (has no administration in time range)  dextrose 5 % and 0.45 % NaCl with KCl 20 mEq/L infusion (has no administration in time range)  methocarbamol (ROBAXIN) 1,000 mg in dextrose 5 % 50 mL IVPB (has no administration in time range)  morphine 4 MG/ML injection 4 mg (4 mg Intravenous Given 03/24/18 1509)  ondansetron (ZOFRAN) injection 4 mg (4 mg Intravenous Given 03/24/18 1509)  iohexol (OMNIPAQUE) 300 MG/ML solution 100 mL (100 mLs Intravenous Contrast Given 03/24/18 1623)  ciprofloxacin (CIPRO) IVPB 400 mg (400 mg Intravenous Given 03/24/18 1915)     Initial Impression / Assessment and Plan / ED Course  I have reviewed the triage vital signs and the nursing notes.  Pertinent labs & imaging results that were  available during my care of the patient were reviewed by me and considered in my medical decision making (see chart for details).     Kyle Conrad is a 52 y.o. male with a past medical history significant for CHF, hypertension, hypoalbuminemia, and chronic anemia who presents for abdominal pain and inguinal pain.  Patient reports that he was just discharged from the hospital after CHF exacerbation several days ago.  He says that since being discharged he has had severe abdominal pain.  He reports it is in the upper abdomen as well as the lower abdomen including going into his inguinal area.  He denies any history of hernia to his knowledge.  He reports some mild dysuria.  He reports constipation and had a bowel movement yesterday that was relatively normal.  He reports that he has had decreased flatus however.  He reports chills but no fevers.  He denies any cough, chest pain, or shortness of breath.  He does think that his edema  has improved in his legs from admission.  He reports the pain in his abdomen is 8 out of 10 in severity and he has had nearly nonstop nausea and vomiting.  He has had some difficulty taking his oral medicines due to the vomiting.  He denies other complaints on arrival denies any trauma.    On exam, patient has a large hard firm bulge in his left inguinal area going towards the scrotum.  Testicles are nontender.  Clinically it appears like a hernia with possible incarceration due to the tenderness and hardness of the palpated area.  His abdomen is also diffusely tender.  Bowel sounds were appreciated.  Back was nontender.  Lungs had crackles in the bases but chest was nontender.  Faint systolic murmur appreciated.  Legs showed no edema.  Given patient's clinical exam concerning for incarcerated hernia, patient will have labs and CT imaging will be obtained.  Chart review shows that patient had ultrasound during his admission for upper abdominal pain showing concern for gallbladder  wall thickening and sludge, will reassess this with the CT however if CT is completely unremarkable, will consider repeat right upper quadrant ultrasound given tenderness in this area.  Patient was given pain medicine, and nausea medicine.  He will have labs obtained.    Anticipate reassessment after work-up.  5:29 PM CT imaging shows concern for small bowel obstruction with indirect inguinal hernia on the left.  There is also evidence of ascites.  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  General surgery was called who will come see patient.  General surgery will take to OR and admit.   Final Clinical Impressions(s) / ED Diagnoses   Final diagnoses:  SBO (small bowel obstruction) ([facility])  Indirect inguinal hernia  Generalized abdominal pain  Intractable vomiting with nausea, unspecified vomiting type    ED Discharge Orders    None     Clinical Impression: 1. SBO (small bowel obstruction) ([place])   2. Indirect inguinal hernia   3. Generalized abdominal pain   4. Intractable vomiting with nausea, unspecified vomiting type   5. Surgery, elective   6. Encounter for imaging study to confirm nasogastric (NG) tube placement     Disposition: Admit  This note was prepared with assistance of Dragon voice recognition software. Occasional wrong-word or sound-a-like substitutions may have occurred due to the inherent limitations of voice recognition software.     Jakarius Flamenco, Gwenyth Allegra, MD 03/24/18 2041

## 2018-03-24 NOTE — ED Notes (Signed)
Pt transported to xray 

## 2018-03-24 NOTE — Progress Notes (Signed)
Patients friend Dewaine Oats called following up with regards to surgery. This nurse confirmed with patient it was ok to speak with her in regards to his care and he was agreeable. Updated Yvette on NGT placement, surgical site and patient condition. Dewaine Oats mentioned she will be visiting tomorrow and that she was the closest to family the patient had.

## 2018-03-24 NOTE — ED Notes (Signed)
Dr Grandville Silos    Here to see

## 2018-03-24 NOTE — Op Note (Signed)
03/24/2018  8:20 PM  PATIENT:  Kyle Conrad  52 y.o. male  PRE-OPERATIVE DIAGNOSIS: Incarcerated left inguinal hernia with small bowel obstruction  POST-OPERATIVE DIAGNOSIS: Incarcerated left inguinal hernia with small bowel obstruction  PROCEDURE:  Procedure(s): REPAIR INCARCERATED LEFT INGUINAL HERNIA INSERTION OF MESH  SURGEON:  Surgeon(s): Georganna Skeans, MD  ASSISTANTS: none   ANESTHESIA:   local and general  EBL:  Total I/O In: 500 [IV Piggyback:500] Out: 1700 [Other:1700]  BLOOD ADMINISTERED:none  DRAINS: none   SPECIMEN:  No Specimen  DISPOSITION OF SPECIMEN:  N/A  COUNTS:  YES  DICTATION: .Dragon Dictation Findings: Incarcerated left inguinal hernia containing small bowel.  Small bowel was viable.  Procedure in detail: Mr. Capuano was brought emergently for repair of incarcerated left inguinal hernia causing small bowel obstruction.  Informed consent was obtained.  He received intravenous antibiotics.  He was brought to the operating room and general endotracheal anesthesia was administered by the anesthesia staff.  His lower abdomen and groins were prepped and draped in sterile fashion.  We did a timeout procedure.  Left inguinal incision was made and subcutaneous tissues were dissected down revealing the external oblique fascia.  This was divided laterally and the division was continued out medially over the incarcerated hernia.  The superior leaflet of the external oblique fascia was dissected off the transversalis.  The inferior leaflet was dissected down revealing the shelving edge of the inguinal ligament.  The cord structures were separated from the hernia sac.  The hernia sac was opened and was noted to contain a loop of small bowel.  The small bowel was a little erythematous but completely viable.  The hernia defect was small it was opened slightly to facilitate reduction.  The bowel was easily reduced.  The hernia sac was high ligated with 2-0 Vicryl and  removed.  Hernia repair was completed with a keyhole polypropylene mesh cut to custom size and shape.  This was tacked with 2-0 Prolene along the shelving edge of the inguinal ligament inferiorly.  It was tacked with 2-0 Prolene to the tissues over the pubic tubercle and along the transversalis superiorly with 2-0 Prolene..  The 2 leaflets of the mesh were rejoined behind the cord structures.  They were tacked together and down to the underlying musculature with 2-0 Prolene.  The 2 leaflets of the mesh were tucked out laterally far underneath the external oblique fascia.  The area was irrigated.  Hemostasis was ensured.  The external oblique fascia was closed with running 2-0 Vicryl.  Scarpa's fascia was closed with interrupted 3-0 Vicryl and the skin was closed with 4-0 Monocryl and Dermabond.  All counts were correct.  He tolerated the procedure well without apparent complication.  He was taken recovery in stable condition.  We will plan on consulting the medical service that this discharged him recently regarding his congestive heart failure and other medical problems. PATIENT DISPOSITION:  PACU - hemodynamically stable.   Delay start of Pharmacological VTE agent (>24hrs) due to surgical blood loss or risk of bleeding:  no  Georganna Skeans, MD, MPH, FACS Pager: [phone]  11/9/20198:20 PM

## 2018-03-24 NOTE — ED Notes (Signed)
Pt just returned from [place]

## 2018-03-24 NOTE — Consult Note (Signed)
Medical Consultation   Kyle Conrad  BZJ:[id number]  DOB: 1966-04-02  DOA: 03/24/2018  PCP: Dorena Dew, FNP    Outpatient Specialists: Dr. Aundra Dubin (cardiology)   Requesting physician: Dr. Grandville Silos (surgery)   Reason for consultation: Management of comorbid medical conditions     History of Present Illness: Kyle Conrad is an 52 y.o. male with medical history significant for nonischemic cardiomyopathy, hypertension, cirrhosis, obstructive sleep apnea, and chronic systolic CHF, discharged from the medicine service on 03/20/2018 after admission for acute CHF, and now presenting with abdominal pain and nausea with vomiting.  Patient was diuresed 72 pounds during the recent admission, reports developing pain across the upper abdomen shortly after returning home, and has since developed nausea with recurrent vomiting.  He also reports a one-year history of left groin mass that seems to come and go.  Upon arrival to the emergency department, he had stable vital signs, new mild thrombocytosis, mild elevation in lactic acid, improved hyponatremia, stable elevations and total bilirubin and INR, and CT findings concerning for SBO secondary to incarcerated left inguinal hernia.  Surgery was consulted and the patient was taken for emergent surgical repair.  Patient has tolerated the procedure well with no immediate complications and hospitalists are asked to consult for management of his advanced medical comorbidities.  Review of Systems:  ROS As per HPI otherwise 10 point review of systems negative.   Past Medical History: Past Medical History:  Diagnosis Date  . CHF (congestive heart failure) ([place])   . Hypertension     Past Surgical History: Past Surgical History:  Procedure Laterality Date  . CARDIAC CATHETERIZATION N/A 04/30/2015   Procedure: Right/Left Heart Cath and Coronary Angiography;  Surgeon: Larey Dresser, MD;  Location: [place] [facility];  Service:  Cardiovascular;  Laterality: N/A;  . COLONOSCOPY WITH PROPOFOL N/A 05/01/2015   Procedure: COLONOSCOPY WITH PROPOFOL;  Surgeon: Wilford Corner, MD;  Location: [facility] ENDOSCOPY;  Service: Endoscopy;  Laterality: N/A;  . ESOPHAGOGASTRODUODENOSCOPY (EGD) WITH PROPOFOL N/A 05/01/2015   Procedure: ESOPHAGOGASTRODUODENOSCOPY (EGD) WITH PROPOFOL;  Surgeon: Wilford Corner, MD;  Location: [facility] ENDOSCOPY;  Service: Endoscopy;  Laterality: N/A;  . SHOULDER SURGERY       Allergies:   Allergies  Allergen Reactions  . Lisinopril Swelling    REACTION: pt had a swelling episode.  His lips swelled  . Penicillins Hives    Has patient had a PCN reaction causing immediate rash, facial/tongue/throat swelling, SOB or lightheadedness with hypotension: Yes Has patient had a PCN reaction causing severe rash involving mucus membranes or skin necrosis: No Has patient had a PCN reaction that required hospitalization: No Has patient had a PCN reaction occurring within the last 10 years: No If all of the above answers are "NO", then may proceed with Cephalosporin use.     Social History:  reports that he has never smoked. He has never used smokeless tobacco. He reports that he does not drink alcohol or use drugs.   Family History: Family History  Problem Relation Age of Onset  . Lung cancer Mother     Physical Exam: Vitals:   03/24/18 2045 03/24/18 2100 03/24/18 2109 03/24/18 2127  BP: 115/89 (!) 122/100 (!) 123/99 (!) 123/103  Pulse:  91 92 95  Resp: 14 14 16 18   Temp:   97.7 F (36.5 C) 98.2 F (36.8 C)  TempSrc:      SpO2: 99% 100% 100% 100%  Height:        Constitutional: Alert and awake, oriented x3, not in any acute distress. Eyes: PERLA, EOMI, irises appear normal, anicteric sclera,  ENMT: external ears and nose appear normal, Lips appears normal, oropharynx mucosa, tongue, posterior pharynx appear normal; NGT draining dark greenish liquid.  Neck: neck appears normal, no masses, normal ROM,  no thyromegaly, no JVD  CVS: S1-S2 clear, no murmur rubs or gallops, no LE edema   Respiratory:  clear to auscultation bilaterally, no wheezing, rales or rhonchi. Respiratory effort normal. No accessory muscle use.  Abdomen: soft, nontender, nondistended, left inguinal surgical incision well-approximated without drainage  Musculoskeletal: : no cyanosis, clubbing or edema noted bilaterally  Neuro: Cranial nerves II-XII intact, strength, sensation, reflexes Psych: judgement and insight appear normal, stable mood and affect, mental status Skin: no rashes or lesions or ulcers, no induration or nodules    Data reviewed:  I have personally reviewed following labs and imaging studies Labs:  CBC: Recent Labs  Lab 03/19/18 0848 03/24/18 1435  WBC 4.1 5.7  NEUTROABS  --  3.7  HGB 13.1 14.6  HCT 38.8* 43.7  MCV 89.6 91.2  PLT 215 414*    Basic Metabolic Panel: Recent Labs  Lab 03/18/18 0420 03/19/18 0404 03/20/18 0227 03/24/18 1435  NA 129* 128* 129* 132*  K 4.3 4.0 4.0 3.5  CL 91* 95* 93* 89*  CO2 29 30 29 30   GLUCOSE 76 83 90 95  BUN 20 17 17 20   CREATININE 1.13 0.99 0.92 1.22  CALCIUM 8.2* 8.2* 8.2* 9.0   GFR Estimated Creatinine Clearance: 74.6 mL/min (by C-G formula based on SCr of 1.22 mg/dL). Liver Function Tests: Recent Labs  Lab 03/19/18 0848 03/24/18 1435  AST 34 34  ALT 32 32  ALKPHOS 121 129*  BILITOT 3.4* 3.8*  PROT 7.9 8.1  ALBUMIN 2.0* 2.4*   Recent Labs  Lab 03/24/18 1435  LIPASE 34   Recent Labs  Lab 03/24/18 1435  AMMONIA 25   Coagulation profile Recent Labs  Lab 03/24/18 1435  INR 1.37    Cardiac Enzymes: No results for input(s): CKTOTAL, CKMB, CKMBINDEX, TROPONINI in the last 168 hours. BNP: Invalid input(s): POCBNP CBG: No results for input(s): GLUCAP in the last 168 hours. D-Dimer No results for input(s): DDIMER in the last 72 hours. Hgb A1c No results for input(s): HGBA1C in the last 72 hours. Lipid Profile No results for  input(s): CHOL, HDL, LDLCALC, TRIG, CHOLHDL, LDLDIRECT in the last 72 hours. Thyroid function studies No results for input(s): TSH, T4TOTAL, T3FREE, THYROIDAB in the last 72 hours.  Invalid input(s): FREET3 Anemia work up No results for input(s): VITAMINB12, FOLATE, FERRITIN, TIBC, IRON, RETICCTPCT in the last 72 hours. Urinalysis    Component Value Date/Time   COLORURINE AMBER (A) 03/24/2018 1603   APPEARANCEUR CLEAR 03/24/2018 1603   LABSPEC 1.014 03/24/2018 1603   PHURINE 8.0 03/24/2018 1603   GLUCOSEU NEGATIVE 03/24/2018 1603   HGBUR NEGATIVE 03/24/2018 1603   BILIRUBINUR NEGATIVE 03/24/2018 1603   KETONESUR 5 (A) 03/24/2018 1603   PROTEINUR 30 (A) 03/24/2018 1603   UROBILINOGEN 0.2 09/10/2015 1351   NITRITE NEGATIVE 03/24/2018 1603   LEUKOCYTESUR NEGATIVE 03/24/2018 1603     Microbiology No results found for this or any previous visit (from the past 240 hour(s)).     Inpatient Medications:   Scheduled Meds: Continuous Infusions: . dextrose 5 % and 0.45 % NaCl with KCl 20 mEq/L    . methocarbamol (ROBAXIN) IV  Radiological Exams on Admission: Dg Chest 2 View  Result Date: 03/24/2018 CLINICAL DATA:  Pt states he has a hernia, pain in upper abdomen x 4 days. Order comments state reason for exam is crackles on lung exam. Pt denies CP. Pt states nausea and vomiting all night. Pt states hx CHF. Nonsmoker EXAM: CHEST - 2 VIEW COMPARISON:  03/10/2018 FINDINGS: Mild cardiomegaly with right heart prominence. Thoracic spondylosis. Degenerative glenohumeral arthropathy bilaterally. The lungs appear clear. IMPRESSION: 1. Stable mild right heart enlargement.  No edema. 2. Thoracic spondylosis. 3. Bilateral glenohumeral arthropathy. Electronically Signed   By: Van Clines M.D.   On: 03/24/2018 15:45   X-ray Abdomen Ap  Result Date: 03/24/2018 CLINICAL DATA:  Nasogastric tube placement. EXAM: ABDOMEN - 1 VIEW COMPARISON:  Abdominal radiograph performed 03/19/2018, and  CT of the abdomen and pelvis performed 03/24/2018 FINDINGS: The patient's enteric tube is noted coiled overlying the body of the stomach. The visualized bowel gas pattern is nonspecific, with a single distended loop of small bowel at the left upper quadrant, and stool filling the ascending colon. There is no definite evidence for bowel obstruction. No free intra-abdominal air is identified, though evaluation for free air is limited on a single supine view. The visualized osseous structures are within normal limits; the sacroiliac joints are unremarkable in appearance. IMPRESSION: 1. Enteric tube noted coiled overlying the body of the stomach. 2. Nonspecific bowel gas pattern, with a single distended loop of small bowel at the left upper quadrant, and stool filling the ascending colon. Electronically Signed   By: Garald Balding M.D.   On: 03/24/2018 21:22   Ct Abdomen Pelvis W Contrast  Result Date: 03/24/2018 CLINICAL DATA:  Diffuse abdominal pain with nausea and vomiting. Scrotal pain for the last few days. EXAM: CT ABDOMEN AND PELVIS WITH CONTRAST TECHNIQUE: Multidetector CT imaging of the abdomen and pelvis was performed using the standard protocol following bolus administration of intravenous contrast. CONTRAST:  128mL OMNIPAQUE IOHEXOL 300 MG/ML  SOLN COMPARISON:  04/23/2015 FINDINGS: Lower chest: Mild cardiomegaly. Incidental lipoma along the right serratus anterior muscle partially included on imaging on today's exam. Hepatobiliary: Unremarkable Pancreas: Moderate pancreatic atrophy for age, otherwise unremarkable. Spleen: Unremarkable Adrenals/Urinary Tract: Adrenal glands normal. Two hypodense lesions in the left kidney are likely cysts but technically too small to characterize. Asymmetric delay in excretion of the left kidney compared to the right, best seen on the delayed images. Severe narrowing of the left renal vein behind the superior mesenteric artery and abnormally low SMA-aortic angle at 21  degrees. There is also early opacification of the left gonadal vein indicating collateralization. 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 Stomach/Bowel: Distended stomach and small bowel loops. The small bowel loops are dilated up to 3.9 cm. Dilated loops extend down to a indirect left inguinal hernia which contains 1-2 loops of small bowel as well as a fairly large hydrocele. The small bowel distal to this hernia is of normal caliber. No obvious extraluminal gas. 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 Mild aortoiliac atherosclerotic vascular disease. Reproductive: The prostate gland appears unremarkable. The left hemiscrotum is swollen by the hernia and adjacent fluid. Other: Small to moderate amount of ascites in the pelvis, paracolic gutters, and perihepatic region. No free air. Mild diffuse subcutaneous edema. Musculoskeletal: Calcifications along the anterior margins of the pubic bones in the vicinity of the adductor aponeurosis, significance uncertain although presumably this  could be due to prior sports hernias. Speckled lucencies in both iliac bones, slightly greater on the right than the left, but given the symmetry this is probably incidental. IMPRESSION: 1. Distal small bowel obstruction due to herniation of a loop of ileum into an indirect left inguinal hernia. The afferent loop is dilated as are all the more proximal loops of small bowel. There is a fluid collection in the hernia sac compatible with hydrocele. 2. Ascites with subcutaneous edema suggesting third spacing of fluid. No free intraperitoneal gas. 3. 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 Abnormally reduced SMA-aortic angle at 21 degrees and early opacification of the left gonadal vein due to  collateralization are ancillary findings. 4. Mild cardiomegaly. 5.  Aortic Atherosclerosis (ICD10-I70.0). 6. Calcifications along the anterior pubis in the vicinity of the abductor aponeurosis, possibly due to remote athletic pubalgia. Electronically Signed   By: Van Clines M.D.   On: 03/24/2018 17:24    Impression/Recommendations  1. Incarcerated left inguinal hernia with SBO  - Presents with abdominal pain and N/V, found to have SBO secondary to incarcerated left inguinal hernia and is now immediately status-post emergent surgical repair  - He appears comfortable, reports pain is well-controlled but still with some nausea, NGT draining  - Ongoing management per surgery   2. Chronic systolic CHF  - Discharged 11/5 after admission for acute CHF in setting of not taking medications for several months  - He had marked hypervolemia, lost 70 lbs on Lasix gtt, and echo revealed EF 20-25% with severe MR, TR, and RVEF depression  - He was net neg 1200 cc in OR and appears euvolemic or slightly hypovolemic after surgery  - He is NPO with NGT draining dark greenish fluid  - Hold diuretics, continue a gentle IVF hydration while NPO, follow strict I/O and daily wt   3. Cirrhosis  - Denies hx of viral hepatitis or EtOH abuse and viral hepatitis panel negative last week  - Possibly congestive hepatopathy given severely depressed RVEF  - Appears compensated  - Ascites noted on CT, could consider a diagnostic paracentesis    4. Hypertension  - BP elevated after surgery; pain may be contributing  - Continue pain-control, use hydralazine IVP's as-needed while NPO    5. Hyponatremia  - Serum sodium is 132 on admission, improved from recent admission and likely secondary to CHF and/or cirrhosis  - Follow fluid status, repeat chem panel in am    Thank you for this consultation.  Our [facility] hospitalist team will follow the patient with you.   Time Spent: 62 minutes.   Ilene Qua Shaletta Hinostroza M.D. [facility] 03/24/2018, 9:53 PM

## 2018-03-24 NOTE — H&P (Signed)
Kyle Conrad is an 52 y.o. male.   Chief Complaint: LIH, N/V HPI: 52yo male just D/C from the hospital 11/5 after admit for CHF exacerbation.  He reports a one year history of a mass in his left groin extending down into his scrotum that comes and goes over time.  It has been out recently.  He developed nausea and vomiting.  He came to the emergency department and was evaluated.  He was found to have incarcerated left inguinal hernia containing small bowel causing a small bowel obstruction.  I was asked to see him for surgical care.  He denies previous history of hernia repair.  He was unaware of what this process has been over the past year with the mass in his groin.  Past Medical History:  Diagnosis Date  . CHF (congestive heart failure) ([place])   . Hypertension     Past Surgical History:  Procedure Laterality Date  . CARDIAC CATHETERIZATION N/A 04/30/2015   Procedure: Right/Left Heart Cath and Coronary Angiography;  Surgeon: Larey Dresser, MD;  Location: [facility];  Service: Cardiovascular;  Laterality: N/A;  . COLONOSCOPY WITH PROPOFOL N/A 05/01/2015   Procedure: COLONOSCOPY WITH PROPOFOL;  Surgeon: Wilford Corner, MD;  Location: [facility] ENDOSCOPY;  Service: Endoscopy;  Laterality: N/A;  . ESOPHAGOGASTRODUODENOSCOPY (EGD) WITH PROPOFOL N/A 05/01/2015   Procedure: ESOPHAGOGASTRODUODENOSCOPY (EGD) WITH PROPOFOL;  Surgeon: Wilford Corner, MD;  Location: [facility] ENDOSCOPY;  Service: Endoscopy;  Laterality: N/A;  . SHOULDER SURGERY      Family History  Problem Relation Age of Onset  . Lung cancer Mother    Social History:  reports that he has never smoked. He has never used smokeless tobacco. He reports that he does not drink alcohol or use drugs.  Allergies:  Allergies  Allergen Reactions  . Lisinopril Swelling    REACTION: pt had a swelling episode.  His lips swelled  . Penicillins Hives    Has patient had a PCN reaction causing immediate rash, facial/tongue/throat swelling,  SOB or lightheadedness with hypotension: Yes Has patient had a PCN reaction causing severe rash involving mucus membranes or skin necrosis: No Has patient had a PCN reaction that required hospitalization: No Has patient had a PCN reaction occurring within the last 10 years: No If all of the above answers are "NO", then may proceed with Cephalosporin use.     (Not in a hospital admission)  Results for orders placed or performed during the hospital encounter of 03/24/18 (from the past 48 hour(s))  CBC with Differential     Status: Abnormal   Collection Time: 03/24/18  2:35 PM  Result Value Ref Range   WBC 5.7 4.0 - 10.5 K/uL   RBC 4.79 4.22 - 5.81 MIL/uL   Hemoglobin 14.6 13.0 - 17.0 g/dL   HCT 43.7 39.0 - 52.0 %   MCV 91.2 80.0 - 100.0 fL   MCH 30.5 26.0 - 34.0 pg   MCHC 33.4 30.0 - 36.0 g/dL   RDW 15.6 (H) 11.5 - 15.5 %   Platelets 414 (H) 150 - 400 K/uL   nRBC 0.0 0.0 - 0.2 %   Neutrophils Relative % 66 %   Neutro Abs 3.7 1.7 - 7.7 K/uL   Lymphocytes Relative 23 %   Lymphs Abs 1.3 0.7 - 4.0 K/uL   Monocytes Relative 10 %   Monocytes Absolute 0.6 0.1 - 1.0 K/uL   Eosinophils Relative 1 %   Eosinophils Absolute 0.0 0.0 - 0.5 K/uL   Basophils Relative  0 %   Basophils Absolute 0.0 0.0 - 0.1 K/uL   Immature Granulocytes 0 %   Abs Immature Granulocytes 0.02 0.00 - 0.07 K/uL    Comment: Performed at [facility], [place] [address], [place], [place] [address]  Comprehensive metabolic panel     Status: Abnormal   Collection Time: 03/24/18  2:35 PM  Result Value Ref Range   Sodium 132 (L) 135 - 145 mmol/L   Potassium 3.5 3.5 - 5.1 mmol/L   Chloride 89 (L) 98 - 111 mmol/L   CO2 30 22 - 32 mmol/L   Glucose, Bld 95 70 - 99 mg/dL   BUN 20 6 - 20 mg/dL   Creatinine, Ser 1.22 0.61 - 1.24 mg/dL   Calcium 9.0 8.9 - 10.3 mg/dL   Total Protein 8.1 6.5 - 8.1 g/dL   Albumin 2.4 (L) 3.5 - 5.0 g/dL   AST 34 15 - 41 U/L   ALT 32 0 - 44 U/L   Alkaline Phosphatase 129 (H) 38 - 126 U/L    Total Bilirubin 3.8 (H) 0.3 - 1.2 mg/dL   GFR calc non Af Amer >60 >60 mL/min   GFR calc Af Amer >60 >60 mL/min    Comment: (NOTE) The eGFR has been calculated using the CKD EPI equation. This calculation has not been validated in all clinical situations. eGFR's persistently <60 mL/min signify possible Chronic Kidney Disease.    Anion gap 13 5 - 15    Comment: Performed at [facility] [address], [place], [place] [address]  Lipase, blood     Status: None   Collection Time: 03/24/18  2:35 PM  Result Value Ref Range   Lipase 34 11 - 51 U/L    Comment: Performed at [facility], [place] [address] [place], [place] [address]  Protime-INR     Status: Abnormal   Collection Time: 03/24/18  2:35 PM  Result Value Ref Range   Prothrombin Time 16.8 (H) 11.4 - 15.2 seconds   INR 1.37     Comment: Performed at [facility] [address] [place], [place] [address]  Ammonia     Status: None   Collection Time: 03/24/18  2:35 PM  Result Value Ref Range   Ammonia 25 9 - 35 umol/L    Comment: Performed at [facility], [place] [address] [place], [place] [address]  I-Stat CG4 Lactic Acid, ED     Status: Abnormal   Collection Time: 03/24/18  3:07 PM  Result Value Ref Range   Lactic Acid, Venous 2.26 (HH) 0.5 - 1.9 mmol/L   Comment NOTIFIED PHYSICIAN   Urinalysis, Routine w reflex microscopic     Status: Abnormal   Collection Time: 03/24/18  4:03 PM  Result Value Ref Range   Color, Urine AMBER (A) YELLOW    Comment: BIOCHEMICALS MAY BE AFFECTED BY COLOR   APPearance CLEAR CLEAR   Specific Gravity, Urine 1.014 1.005 - 1.030   pH 8.0 5.0 - 8.0   Glucose, UA NEGATIVE NEGATIVE mg/dL   Hgb urine dipstick NEGATIVE NEGATIVE   Bilirubin Urine NEGATIVE NEGATIVE   Ketones, ur 5 (A) NEGATIVE mg/dL   Protein, ur 30 (A) NEGATIVE mg/dL   Nitrite NEGATIVE NEGATIVE   Leukocytes, UA NEGATIVE NEGATIVE   RBC / HPF 0-5 0 - 5 RBC/hpf   WBC, UA 0-5 0 - 5 WBC/hpf   Bacteria, UA NONE  SEEN NONE SEEN   Squamous Epithelial /  LPF 0-5 0 - 5   Hyaline Casts, UA PRESENT     Comment: Performed at [facility], [place] [address] [place], [place] [address]   Dg Chest 2 View  Result Date: 03/24/2018 CLINICAL DATA:  Pt states he has a hernia, pain in upper abdomen x 4 days. Order comments state reason for exam is crackles on lung exam. Pt denies CP. Pt states nausea and vomiting all night. Pt states hx CHF. Nonsmoker EXAM: CHEST - 2 VIEW COMPARISON:  03/10/2018 FINDINGS: Mild cardiomegaly with right heart prominence. Thoracic spondylosis. Degenerative glenohumeral arthropathy bilaterally. The lungs appear clear. IMPRESSION: 1. Stable mild right heart enlargement.  No edema. 2. Thoracic spondylosis. 3. Bilateral glenohumeral arthropathy. Electronically Signed   By: Van Clines M.D.   On: 03/24/2018 15:45   Ct Abdomen Pelvis W Contrast  Result Date: 03/24/2018 CLINICAL DATA:  Diffuse abdominal pain with nausea and vomiting. Scrotal pain for the last few days. EXAM: CT ABDOMEN AND PELVIS WITH CONTRAST TECHNIQUE: Multidetector CT imaging of the abdomen and pelvis was performed using the standard protocol following bolus administration of intravenous contrast. CONTRAST:  153m OMNIPAQUE IOHEXOL 300 MG/ML  SOLN COMPARISON:  04/23/2015 FINDINGS: Lower chest: Mild cardiomegaly. Incidental lipoma along the right serratus anterior muscle partially included on imaging on today's exam. Hepatobiliary: Unremarkable Pancreas: Moderate pancreatic atrophy for age, otherwise unremarkable. Spleen: Unremarkable Adrenals/Urinary Tract: Adrenal glands normal. Two hypodense lesions in the left kidney are likely cysts but technically too small to characterize. Asymmetric delay in excretion of the left kidney compared to the right, best seen on the delayed images. 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 There is also early  opacification of the left gonadal vein indicating collateralization. 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 Stomach/Bowel: Distended stomach and small bowel loops. The small bowel loops are dilated up to 3.9 cm. Dilated loops extend down to a indirect left inguinal hernia which contains 1-2 loops of small bowel as well as a fairly large hydrocele. The small bowel distal to this hernia is of normal caliber. No obvious extraluminal gas. 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 Mild aortoiliac atherosclerotic vascular disease. Reproductive: The prostate gland appears unremarkable. The left hemiscrotum is swollen by the hernia and adjacent fluid. Other: Small to moderate amount of ascites in the pelvis, paracolic gutters, and perihepatic region. No free air. Mild diffuse subcutaneous edema. Musculoskeletal: Calcifications along the anterior margins of the pubic bones in the vicinity of the adductor aponeurosis, significance uncertain although presumably this could be due to prior sports hernias. Speckled lucencies in both iliac bones, slightly greater on the right than the left, but given the symmetry this is probably incidental. IMPRESSION: 1. Distal small bowel obstruction due to herniation of a loop of ileum into an indirect left inguinal hernia. The afferent loop is dilated as are all the more proximal loops of small bowel. There is a fluid collection in the hernia sac compatible with hydrocele. 2. Ascites with subcutaneous edema suggesting third spacing of fluid. No free intraperitoneal gas. 3. 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 Abnormally reduced SMA-aortic angle at 21 degrees and early opacification of the left gonadal vein due to collateralization are ancillary  findings. 4. Mild cardiomegaly. 5.  Aortic Atherosclerosis (ICD10-I70.0). 6. Calcifications along the anterior pubis in the vicinity of the abductor aponeurosis, possibly due to remote athletic pubalgia. Electronically Signed   By: Van Clines M.D.   On: 03/24/2018 17:24    Review of Systems  Constitutional: Negative for chills and fever.  HENT: Negative for hearing loss.   Eyes: Negative for blurred vision.  Respiratory: Positive for shortness of breath.   Cardiovascular: Negative for chest pain.  Gastrointestinal: Positive for nausea and vomiting. Negative for blood in stool.  Genitourinary:       Left groin mass  Musculoskeletal: Negative.   Skin: Negative.   Neurological: Negative.   Endo/Heme/Allergies: Negative.   Psychiatric/Behavioral: Negative.     Blood pressure 107/83, pulse 97, temperature (!) 97.5 F (36.4 C), temperature source Oral, resp. rate 14, height 6' (1.829 m), SpO2 97 %. Physical Exam  Constitutional: He is oriented to person, place, and time. He appears well-developed. No distress.  HENT:  Head: Normocephalic.  Right Ear: External ear normal.  Left Ear: External ear normal.  Mouth/Throat: Oropharynx is clear and moist.  Eyes: Pupils are equal, round, and reactive to light.  Neck: No tracheal deviation present. No thyromegaly present.  Cardiovascular: Normal rate, regular rhythm and normal heart sounds.  Respiratory: Effort normal. No respiratory distress. He has no wheezes. He has rales.  GI: Soft. He exhibits distension. There is no tenderness. There is no rebound.  Genitourinary:  Genitourinary Comments: Incarcerated left inguinal hernia down into the scrotum, 1 cm excoriation right lateral penile shaft  Neurological: He is alert and oriented to person, place, and time.  Skin: Skin is warm.  Psychiatric: He has a normal mood and affect.     Assessment/Plan Incarcerated left inguinal hernia causing small bowel obstruction - I have offered  repair as an emergent surgical procedure.  I advised him he may require bowel resection.  He is also at increased risk of cardiac complications due to his congestive heart failure.  I discussed procedure, risks, and benefits.  He is agreeable.  Zenovia Jarred, MD 03/24/2018, 5:58 PM

## 2018-03-24 NOTE — ED Notes (Signed)
Report called to the or

## 2018-03-24 NOTE — ED Notes (Signed)
Pt transported to CT ?

## 2018-03-24 NOTE — Anesthesia Preprocedure Evaluation (Addendum)
Anesthesia Evaluation  Patient identified by MRN, date of birth, ID band Patient awake    Reviewed: Allergy & Precautions, NPO status , Patient's Chart, lab work & pertinent test results  Airway Mallampati: II  TM Distance: >3 FB Neck ROM: Full    Dental  (+) Teeth Intact, Dental Advisory Given   Pulmonary sleep apnea ,    breath sounds clear to auscultation       Cardiovascular hypertension, Pt. on medications +CHF   Rhythm:Regular Rate:Normal     Neuro/Psych negative neurological ROS     GI/Hepatic negative GI ROS, Neg liver ROS,   Endo/Other  negative endocrine ROS  Renal/GU negative Renal ROS     Musculoskeletal negative musculoskeletal ROS (+)   Abdominal Normal abdominal exam  (+)   Peds  Hematology   Anesthesia Other Findings   Reproductive/Obstetrics                            Lab Results  Component Value Date   WBC 5.7 03/24/2018   HGB 14.6 03/24/2018   HCT 43.7 03/24/2018   MCV 91.2 03/24/2018   PLT 414 (H) 03/24/2018   Lab Results  Component Value Date   CREATININE 1.22 03/24/2018   BUN 20 03/24/2018   NA 132 (L) 03/24/2018   K 3.5 03/24/2018   CL 89 (L) 03/24/2018   CO2 30 03/24/2018   Lab Results  Component Value Date   INR 1.37 03/24/2018   INR 1.62 03/17/2018   INR 2.19 03/11/2018   Echo: - Left ventricle: The cavity size was severely dilated. Wall   thickness was increased in a pattern of mild LVH. Systolic   function was severely reduced. The estimated ejection fraction   was in the range of 20% to 25%. - Aortic valve: There was mild regurgitation. - Mitral valve: MR is eccentric and severe It is more posterior   into LA - Left atrium: The atrium was moderately to severely dilated. - Right ventricle: The cavity size was severely dilated. Systolic   function was severely reduced. - Right atrium: The atrium was severely dilated. - Tricuspid valve: TV  leaflets do not coapt TR is severe . - Pericardium, extracardiac: A small pericardial effusion was   identified. - Impressions: COmpared to report of echo from 2017, LVEF is worse.   TR and MR are now severe RVEF was not reported at that time, now   it is severely depressed.  Anesthesia Physical Anesthesia Plan  ASA: IV  Anesthesia Plan: General   Post-op Pain Management:    Induction: Intravenous, Rapid sequence and Cricoid pressure planned  PONV Risk Score and Plan: 3 and Ondansetron, Midazolam and Dexamethasone  Airway Management Planned: Oral ETT  Additional Equipment: None  Intra-op Plan:   Post-operative Plan: Extubation in OR  Informed Consent: I have reviewed the patients History and Physical, chart, labs and discussed the procedure including the risks, benefits and alternatives for the proposed anesthesia with the patient or authorized representative who has indicated his/her understanding and acceptance.   Dental advisory given  Plan Discussed with: CRNA  Anesthesia Plan Comments: (Etomidate. )       Anesthesia Quick Evaluation

## 2018-03-24 NOTE — ED Triage Notes (Signed)
Pt arrives via ems with bil upper adb pain onset 4 days ago. Recently discharged. Pt endorses emesis. States pain is intermittent. Denies diarrhea.  124/88, HR 100, 100% RA, CBG 113.

## 2018-03-24 NOTE — ED Notes (Signed)
Or called  To bring pt upstairs

## 2018-03-24 NOTE — ED Notes (Signed)
Patient is resting with call bell in reach  

## 2018-03-24 NOTE — Anesthesia Procedure Notes (Addendum)
Procedure Name: Intubation Date/Time: 03/24/2018 7:07 PM Performed by: Claris Che, CRNA Pre-anesthesia Checklist: Patient identified, Emergency Drugs available, Suction available and Patient being monitored Patient Re-evaluated:Patient Re-evaluated prior to induction Oxygen Delivery Method: Circle System Utilized Preoxygenation: Pre-oxygenation with 100% oxygen Induction Type: IV induction, Rapid sequence and Cricoid Pressure applied Ventilation: Mask ventilation without difficulty Laryngoscope Size: 4 and Mac Grade View: Grade I Tube type: Oral Tube size: 7.5 mm Number of attempts: 1 Airway Equipment and Method: Stylet and Oral airway Placement Confirmation: ETT inserted through vocal cords under direct vision,  positive ETCO2 and breath sounds checked- equal and bilateral Secured at: 21 cm Tube secured with: Tape Dental Injury: Teeth and Oropharynx as per pre-operative assessment

## 2018-03-24 NOTE — Anesthesia Postprocedure Evaluation (Signed)
Anesthesia Post Note  Patient: Hatim Homann  Procedure(s) Performed: HERNIA REPAIR INGUINAL INCARCERATED (Left Abdomen) INSERTION OF MESH (Left Abdomen)     Patient location during evaluation: PACU Anesthesia Type: General Level of consciousness: awake and alert Pain management: pain level controlled Vital Signs Assessment: post-procedure vital signs reviewed and stable Respiratory status: spontaneous breathing, nonlabored ventilation, respiratory function stable and patient connected to nasal cannula oxygen Cardiovascular status: blood pressure returned to baseline and stable Postop Assessment: no apparent nausea or vomiting Anesthetic complications: no    Last Vitals:  Vitals:   03/24/18 2109 03/24/18 2127  BP: (!) 123/99 (!) 123/103  Pulse: 92 95  Resp: 16 18  Temp: 36.5 C 36.8 C  SpO2: 100% 100%    Last Pain:  Vitals:   03/24/18 2025  TempSrc:   PainSc: 0-No pain                 Effie Berkshire

## 2018-03-24 NOTE — Transfer of Care (Signed)
Immediate Anesthesia Transfer of Care Note  Patient: Kyle Conrad  Procedure(s) Performed: HERNIA REPAIR INGUINAL INCARCERATED (Left Abdomen) INSERTION OF MESH (Left Abdomen)  Patient Location: PACU  Anesthesia Type:General  Level of Consciousness: sedated, drowsy, patient cooperative and responds to stimulation  Airway & Oxygen Therapy: Patient Spontanous Breathing and Patient connected to nasal cannula oxygen  Post-op Assessment: Report given to RN, Post -op Vital signs reviewed and stable and Patient moving all extremities X 4  Post vital signs: Reviewed and stable  Last Vitals:  Vitals Value Taken Time  BP 107/81 03/24/2018  8:27 PM  Temp    Pulse 126 03/24/2018  8:28 PM  Resp 20 03/24/2018  8:29 PM  SpO2 86 % 03/24/2018  8:28 PM  Vitals shown include unvalidated device data.  Last Pain:  Vitals:   03/24/18 1432  TempSrc: Oral  PainSc:          Complications: No apparent anesthesia complications

## 2018-03-25 DIAGNOSIS — Z0189 Encounter for other specified special examinations: Secondary | ICD-10-CM

## 2018-03-25 DIAGNOSIS — R1084 Generalized abdominal pain: Secondary | ICD-10-CM

## 2018-03-25 LAB — CBC
HEMATOCRIT: 42.7 % (ref 39.0–52.0)
HEMOGLOBIN: 13.5 g/dL (ref 13.0–17.0)
MCH: 29.6 pg (ref 26.0–34.0)
MCHC: 31.6 g/dL (ref 30.0–36.0)
MCV: 93.6 fL (ref 80.0–100.0)
Platelets: 410 10*3/uL — ABNORMAL HIGH (ref 150–400)
RBC: 4.56 MIL/uL (ref 4.22–5.81)
RDW: 15.6 % — AB (ref 11.5–15.5)
WBC: 6.5 10*3/uL (ref 4.0–10.5)
nRBC: 0 % (ref 0.0–0.2)

## 2018-03-25 LAB — BASIC METABOLIC PANEL
Anion gap: 16 — ABNORMAL HIGH (ref 5–15)
BUN: 20 mg/dL (ref 6–20)
CHLORIDE: 93 mmol/L — AB (ref 98–111)
CO2: 26 mmol/L (ref 22–32)
CREATININE: 1.12 mg/dL (ref 0.61–1.24)
Calcium: 8.7 mg/dL — ABNORMAL LOW (ref 8.9–10.3)
GFR calc Af Amer: >60 mL/min (ref >60)
GFR calc non Af Amer: >60 mL/min (ref >60)
GLUCOSE: 98 mg/dL (ref 70–99)
POTASSIUM: 3.2 mmol/L — AB (ref 3.5–5.1)
SODIUM: 135 mmol/L (ref 135–145)

## 2018-03-25 LAB — URINE CULTURE: Culture: <10000 — AB

## 2018-03-25 LAB — CREATININE, SERUM
Creatinine, Ser: 1.13 mg/dL (ref 0.61–1.24)
GFR calc Af Amer: >60 mL/min (ref >60)
GFR calc non Af Amer: >60 mL/min (ref >60)

## 2018-03-25 MED ORDER — ACETAMINOPHEN 500 MG PO TABS
500.0000 mg | ORAL_TABLET | Freq: Three times a day (TID) | ORAL | Status: DC | PRN
  Administered 2018-03-26: 500 mg via ORAL
  Filled 2018-03-25: qty 1

## 2018-03-25 NOTE — Progress Notes (Signed)
Patient arrived to unit with Suki, RN transporting and assist of another nurse. Alert, oriented and currently denies pain. Oriented patient to room and [place] policies.

## 2018-03-25 NOTE — Progress Notes (Signed)
1 Day Post-Op   Subjective/Chief Complaint: Still having some pain.  No n/v.     Objective: Vital signs in last 24 hours: Temp:  [97.2 F (36.2 C)-98.9 F (37.2 C)] 98.3 F (36.8 C) (11/10 0557) Pulse Rate:  [81-98] 95 (11/10 0557) Resp:  [14-21] 16 (11/10 0557) BP: (100-130)/(77-103) 116/83 (11/10 0557) SpO2:  [83 %-100 %] 99 % (11/10 0557) Last BM Date: 03/25/18  Intake/Output from previous day: 11/09 0701 - 11/10 0700 In: 1332.7 [I.V.:832.7; IV Piggyback:500] Out: 1610 [Urine:100; Blood:10] Intake/Output this shift: No intake/output data recorded.  General appearance: alert, cooperative and mild distress Resp: breathing comfortably GI: soft, non distended.  left inguinal hernia incision without erythema or drainage. Extremities: extremities normal, atraumatic, no cyanosis or edema  Lab Results:  Recent Labs    03/24/18 1435 03/24/18 2249  WBC 5.7 6.5  HGB 14.6 13.5  HCT 43.7 42.7  PLT 414* 410*   BMET Recent Labs    03/24/18 1435 03/24/18 2249  NA 132* 135  K 3.5 3.2*  CL 89* 93*  CO2 30 26  GLUCOSE 95 98  BUN 20 20  CREATININE 1.22 1.12  1.13  CALCIUM 9.0 8.7*   PT/INR Recent Labs    03/24/18 1435  LABPROT 16.8*  INR 1.37   ABG No results for input(s): PHART, HCO3 in the last 72 hours.  Invalid input(s): PCO2, PO2  Studies/Results: Dg Chest 2 View  Result Date: 03/24/2018 CLINICAL DATA:  Pt states he has a hernia, pain in upper abdomen x 4 days. Order comments state reason for exam is crackles on lung exam. Pt denies CP. Pt states nausea and vomiting all night. Pt states hx CHF. Nonsmoker EXAM: CHEST - 2 VIEW COMPARISON:  03/10/2018 FINDINGS: Mild cardiomegaly with right heart prominence. Thoracic spondylosis. Degenerative glenohumeral arthropathy bilaterally. The lungs appear clear. IMPRESSION: 1. Stable mild right heart enlargement.  No edema. 2. Thoracic spondylosis. 3. Bilateral glenohumeral arthropathy. Electronically Signed   By: Van Clines M.D.   On: 03/24/2018 15:45   X-ray Abdomen Ap  Result Date: 03/24/2018 CLINICAL DATA:  Nasogastric tube placement. EXAM: ABDOMEN - 1 VIEW COMPARISON:  Abdominal radiograph performed 03/19/2018, and CT of the abdomen and pelvis performed 03/24/2018 FINDINGS: The patient's enteric tube is noted coiled overlying the body of the stomach. The visualized bowel gas pattern is nonspecific, with a single distended loop of small bowel at the left upper quadrant, and stool filling the ascending colon. There is no definite evidence for bowel obstruction. No free intra-abdominal air is identified, though evaluation for free air is limited on a single supine view. The visualized osseous structures are within normal limits; the sacroiliac joints are unremarkable in appearance. IMPRESSION: 1. Enteric tube noted coiled overlying the body of the stomach. 2. Nonspecific bowel gas pattern, with a single distended loop of small bowel at the left upper quadrant, and stool filling the ascending colon. Electronically Signed   By: Garald Balding M.D.   On: 03/24/2018 21:22   Ct Abdomen Pelvis W Contrast  Result Date: 03/24/2018 CLINICAL DATA:  Diffuse abdominal pain with nausea and vomiting. Scrotal pain for the last few days. EXAM: CT ABDOMEN AND PELVIS WITH CONTRAST TECHNIQUE: Multidetector CT imaging of the abdomen and pelvis was performed using the standard protocol following bolus administration of intravenous contrast. CONTRAST:  160mL OMNIPAQUE IOHEXOL 300 MG/ML  SOLN COMPARISON:  04/23/2015 FINDINGS: Lower chest: Mild cardiomegaly. Incidental lipoma along the right serratus anterior muscle partially included on imaging on  today's exam. Hepatobiliary: Unremarkable Pancreas: Moderate pancreatic atrophy for age, otherwise unremarkable. Spleen: Unremarkable Adrenals/Urinary Tract: Adrenal glands normal. Two hypodense lesions in the left kidney are likely cysts but technically too small to characterize. Asymmetric  delay in excretion of the left kidney compared to the right, best seen on the delayed images. 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 There is also early opacification of the left gonadal vein indicating collateralization. 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 Stomach/Bowel: Distended stomach and small bowel loops. The small bowel loops are dilated up to 3.9 cm. Dilated loops extend down to a indirect left inguinal hernia which contains 1-2 loops of small bowel as well as a fairly large hydrocele. The small bowel distal to this hernia is of normal caliber. No obvious extraluminal gas. 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 Mild aortoiliac atherosclerotic vascular disease. Reproductive: The prostate gland appears unremarkable. The left hemiscrotum is swollen by the hernia and adjacent fluid. Other: Small to moderate amount of ascites in the pelvis, paracolic gutters, and perihepatic region. No free air. Mild diffuse subcutaneous edema. Musculoskeletal: Calcifications along the anterior margins of the pubic bones in the vicinity of the adductor aponeurosis, significance uncertain although presumably this could be due to prior sports hernias. Speckled lucencies in both iliac bones, slightly greater on the right than the left, but given the symmetry this is probably incidental. IMPRESSION: 1. Distal small bowel obstruction due to herniation of a loop of ileum into an indirect left inguinal hernia. The afferent loop is dilated as are all the more proximal loops of small bowel. There is a fluid collection in the hernia sac compatible with hydrocele. 2. Ascites with subcutaneous edema suggesting third spacing of fluid. No free intraperitoneal gas. 3. 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 Abnormally reduced SMA-aortic angle at 21 degrees and early opacification of the left gonadal vein due to collateralization are ancillary findings. 4. Mild cardiomegaly. 5.  Aortic Atherosclerosis (ICD10-I70.0). 6. Calcifications along the anterior pubis in the vicinity of the abductor aponeurosis, possibly due to remote athletic pubalgia. Electronically Signed   By: Van Clines M.D.   On: 03/24/2018 17:24    Anti-infectives: Anti-infectives (From admission, onward)   Start     Dose/Rate Route Frequency Ordered Stop   03/24/18 1800  ciprofloxacin (CIPRO) IVPB 400 mg     400 mg 200 mL/hr over 60 Minutes Intravenous  Once 03/24/18 1758 03/24/18 1930      Assessment/Plan: s/p Procedure(s): HERNIA REPAIR INGUINAL INCARCERATED (Left) INSERTION OF MESH (Left) d/c ngt  Stay on sips of clears/ice chips only today.   LOS: 1 day    Stark Klein 03/25/2018

## 2018-03-25 NOTE — Progress Notes (Signed)
PROGRESS NOTE    Kyle Conrad  BDZ:[id number] DOB: 07-29-1965 DOA: 03/24/2018 PCP: Dorena Dew, FNP    Brief Narrative:  52 y.o. male with medical history significant for nonischemic cardiomyopathy, hypertension, cirrhosis, obstructive sleep apnea, and chronic systolic CHF, discharged from the medicine service on 03/20/2018 after admission for acute CHF, and now presenting with abdominal pain and nausea with vomiting.  Patient was diuresed 72 pounds during the recent admission, reports developing pain across the upper abdomen shortly after returning home, and has since developed nausea with recurrent vomiting.  He also reports a one-year history of left groin mass that seems to come and go.  Upon arrival to the emergency department, he had stable vital signs, new mild thrombocytosis, mild elevation in lactic acid, improved hyponatremia, stable elevations and total bilirubin and INR, and CT findings concerning for SBO secondary to incarcerated left inguinal hernia.  Surgery was consulted and the patient was taken for emergent surgical repair.  Patient has tolerated the procedure well with no immediate complications and hospitalists are asked to consult for management of his advanced medical comorbidities.  Assessment & Plan:   Principal Problem:   Incarcerated left inguinal hernia Active Problems:   Chronic systolic CHF (congestive heart failure) (HCC)   HTN (hypertension)   Hyponatremia   Other cirrhosis of liver (HCC)   Indirect inguinal hernia   SBO (small bowel obstruction) ([facility])  1. Incarcerated left inguinal hernia with SBO  - Presents with abdominal pain and N/V, found to have SBO secondary to incarcerated left inguinal hernia and is now immediately status-post emergent surgical repair - Ongoing management per surgery  - Stable post-operatively. Eager to start PO intake, defer to Surgery  2. Chronic systolic CHF  - Discharged 11/5 after admission for acute CHF in setting  of not taking medications for several months  - He had marked hypervolemia, lost 70 lbs on Lasix gtt, and echo revealed EF 20-25% with severe MR, TR, and RVEF depression  - He was net neg 1200 cc in OR and appears euvolemic or slightly hypovolemic after surgery  - He is NPO for now - Holding diuretics, continue a gentle IVF hydration while NPO, follow strict I/O and daily wt  -Presently appears euvolemic with no LE edema  3. Cirrhosis  - Denies hx of viral hepatitis or EtOH abuse and viral hepatitis panel negative last week  - Possibly congestive hepatopathy given severely depressed RVEF  - Appears compensated  - Ascites noted on CT, could consider a diagnostic paracentesis  -Stable currently   4. Hypertension  - BP elevated after surgery; pain may be contributing  - Continue pain-control, use hydralazine IVP's as-needed while NPO   -BP currently well controlled  5. Hyponatremia  - Serum sodium is 132 on admission, improved from recent admission and likely secondary to CHF and/or cirrhosis  -Labs reviewed, sodium normalized  6. Hypokalemia -Receiving K in IVF -Repeat bmet in AM   DVT prophylaxis: Lovenox subQ Code Status: Full Family Communication: Pt in room, friend at bedside Disposition Plan: Uncertain at this time  Consultants:     Procedures:   Inguinal hernia repair  Antimicrobials: Anti-infectives (From admission, onward)   Start     Dose/Rate Route Frequency Ordered Stop   03/24/18 1800  ciprofloxacin (CIPRO) IVPB 400 mg     400 mg 200 mL/hr over 60 Minutes Intravenous  Once 03/24/18 1758 03/24/18 1930       Subjective: Wants to start eating  Objective: Vitals:  03/24/18 2140 03/25/18 0201 03/25/18 0557 03/25/18 1424  BP: 130/89 (!) 121/96 116/83 101/72  Pulse:  97 95 91  Resp:  16 16   Temp:  98.9 F (37.2 C) 98.3 F (36.8 C) 98.1 F (36.7 C)  TempSrc:  Oral Oral Oral  SpO2:  98% 99% 99%  Height:        Intake/Output Summary (Last 24  hours) at 03/25/2018 1434 Last data filed at 03/25/2018 1000 Gross per 24 hour  Intake 1332.71 ml  Output 2010 ml  Net -677.29 ml   There were no vitals filed for this visit.  Examination:  General exam: Appears calm and comfortable  Respiratory system: Clear to auscultation. Respiratory effort normal. Cardiovascular system: S1 & S2 heard, RRR Gastrointestinal system: Abdomen is nondistended, soft, pos BS Central nervous system: Alert and oriented. No focal neurological deficits. Extremities: Symmetric 5 x 5 power. Skin: No rashes, lesions  Psychiatry: Judgement and insight appear normal. Mood & affect appropriate.   Data Reviewed: I have personally reviewed following labs and imaging studies  CBC: Recent Labs  Lab 03/19/18 0848 03/24/18 1435 03/24/18 2249  WBC 4.1 5.7 6.5  NEUTROABS  --  3.7  --   HGB 13.1 14.6 13.5  HCT 38.8* 43.7 42.7  MCV 89.6 91.2 93.6  PLT 215 414* 267*   Basic Metabolic Panel: Recent Labs  Lab 03/19/18 0404 03/20/18 0227 03/24/18 1435 03/24/18 2249  NA 128* 129* 132* 135  K 4.0 4.0 3.5 3.2*  CL 95* 93* 89* 93*  CO2 30 29 30 26   GLUCOSE 83 90 95 98  BUN 17 17 20 20   CREATININE 0.99 0.92 1.22 1.12  1.13  CALCIUM 8.2* 8.2* 9.0 8.7*   GFR: Estimated Creatinine Clearance: 81.3 mL/min (by C-G formula based on SCr of 1.12 mg/dL). Liver Function Tests: Recent Labs  Lab 03/19/18 0848 03/24/18 1435  AST 34 34  ALT 32 32  ALKPHOS 121 129*  BILITOT 3.4* 3.8*  PROT 7.9 8.1  ALBUMIN 2.0* 2.4*   Recent Labs  Lab 03/24/18 1435  LIPASE 34   Recent Labs  Lab 03/24/18 1435  AMMONIA 25   Coagulation Profile: Recent Labs  Lab 03/24/18 1435  INR 1.37   Cardiac Enzymes: No results for input(s): CKTOTAL, CKMB, CKMBINDEX, TROPONINI in the last 168 hours. BNP (last 3 results) No results for input(s): PROBNP in the last 8760 hours. HbA1C: No results for input(s): HGBA1C in the last 72 hours. CBG: No results for input(s): GLUCAP in  the last 168 hours. Lipid Profile: No results for input(s): CHOL, HDL, LDLCALC, TRIG, CHOLHDL, LDLDIRECT in the last 72 hours. Thyroid Function Tests: No results for input(s): TSH, T4TOTAL, FREET4, T3FREE, THYROIDAB in the last 72 hours. Anemia Panel: No results for input(s): VITAMINB12, FOLATE, FERRITIN, TIBC, IRON, RETICCTPCT in the last 72 hours. Sepsis Labs: Recent Labs  Lab 03/24/18 1507 03/24/18 1758  LATICACIDVEN 2.26* 1.47    Recent Results (from the past 240 hour(s))  Urine culture     Status: Abnormal   Collection Time: 03/24/18  4:03 PM  Result Value Ref Range Status   Specimen Description URINE, RANDOM  Final   Special Requests NONE  Final   Culture (A)  Final    <10,000 COLONIES/mL INSIGNIFICANT GROWTH Performed at [facility], [place] [address], [place], [place] [address]    Report Status 03/25/2018 FINAL  Final     Radiology Studies: Dg Chest 2 View  Result Date: 03/24/2018 CLINICAL DATA:  Pt states he has a hernia, pain in upper abdomen x 4 days. Order comments state reason for exam is crackles on lung exam. Pt denies CP. Pt states nausea and vomiting all night. Pt states hx CHF. Nonsmoker EXAM: CHEST - 2 VIEW COMPARISON:  03/10/2018 FINDINGS: Mild cardiomegaly with right heart prominence. Thoracic spondylosis. Degenerative glenohumeral arthropathy bilaterally. The lungs appear clear. IMPRESSION: 1. Stable mild right heart enlargement.  No edema. 2. Thoracic spondylosis. 3. Bilateral glenohumeral arthropathy. Electronically Signed   By: Van Clines M.D.   On: 03/24/2018 15:45   X-ray Abdomen Ap  Result Date: 03/24/2018 CLINICAL DATA:  Nasogastric tube placement. EXAM: ABDOMEN - 1 VIEW COMPARISON:  Abdominal radiograph performed 03/19/2018, and CT of the abdomen and pelvis performed 03/24/2018 FINDINGS: The patient's enteric tube is noted coiled overlying the body of the stomach. The visualized bowel gas pattern is nonspecific, with a single distended loop  of small bowel at the left upper quadrant, and stool filling the ascending colon. There is no definite evidence for bowel obstruction. No free intra-abdominal air is identified, though evaluation for free air is limited on a single supine view. The visualized osseous structures are within normal limits; the sacroiliac joints are unremarkable in appearance. IMPRESSION: 1. Enteric tube noted coiled overlying the body of the stomach. 2. Nonspecific bowel gas pattern, with a single distended loop of small bowel at the left upper quadrant, and stool filling the ascending colon. Electronically Signed   By: Garald Balding M.D.   On: 03/24/2018 21:22   Ct Abdomen Pelvis W Contrast  Result Date: 03/24/2018 CLINICAL DATA:  Diffuse abdominal pain with nausea and vomiting. Scrotal pain for the last few days. EXAM: CT ABDOMEN AND PELVIS WITH CONTRAST TECHNIQUE: Multidetector CT imaging of the abdomen and pelvis was performed using the standard protocol following bolus administration of intravenous contrast. CONTRAST:  157mL OMNIPAQUE IOHEXOL 300 MG/ML  SOLN COMPARISON:  04/23/2015 FINDINGS: Lower chest: Mild cardiomegaly. Incidental lipoma along the right serratus anterior muscle partially included on imaging on today's exam. Hepatobiliary: Unremarkable Pancreas: Moderate pancreatic atrophy for age, otherwise unremarkable. Spleen: Unremarkable Adrenals/Urinary Tract: Adrenal glands normal. Two hypodense lesions in the left kidney are likely cysts but technically too small to characterize. Asymmetric delay in excretion of the left kidney compared to the right, best seen on the delayed images. 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 There is also early opacification of the left gonadal vein indicating collateralization. 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 Stomach/Bowel: Distended stomach and small bowel loops. The small bowel loops are dilated up to 3.9 cm. Dilated loops extend down to a indirect left inguinal hernia which contains 1-2 loops of small bowel as well as a fairly large hydrocele. The small bowel distal to this hernia is of normal caliber. No obvious extraluminal gas. 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 Mild aortoiliac atherosclerotic vascular disease. Reproductive: The prostate gland appears unremarkable. The left hemiscrotum is swollen by the hernia and adjacent fluid. Other: Small to moderate amount of ascites in the pelvis, paracolic gutters, and perihepatic region. No free air. Mild diffuse subcutaneous edema. Musculoskeletal: Calcifications along the anterior margins of the pubic bones in the vicinity of the adductor aponeurosis, significance uncertain although presumably this could be due to prior sports hernias. Speckled lucencies in both iliac bones, slightly greater  on the right than the left, but given the symmetry this is probably incidental. IMPRESSION: 1. Distal small bowel obstruction due to herniation of a loop of ileum into an indirect left inguinal hernia. The afferent loop is dilated as are all the more proximal loops of small bowel. There is a fluid collection in the hernia sac compatible with hydrocele. 2. Ascites with subcutaneous edema suggesting third spacing of fluid. No free intraperitoneal gas. 3. 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 Abnormally reduced SMA-aortic angle at 21 degrees and early opacification of the left gonadal vein due to collateralization are ancillary findings. 4. Mild cardiomegaly. 5.  Aortic Atherosclerosis (ICD10-I70.0). 6. Calcifications along the anterior pubis in the vicinity of the abductor aponeurosis, possibly due to remote  athletic pubalgia. Electronically Signed   By: Van Clines M.D.   On: 03/24/2018 17:24    Scheduled Meds: . enoxaparin (LOVENOX) injection  40 mg Subcutaneous Q24H  . pantoprazole (PROTONIX) IV  40 mg Intravenous QHS   Continuous Infusions: . dextrose 5 % and 0.45 % NaCl with KCl 20 mEq/L 50 mL/hr at 03/25/18 0500  . methocarbamol (ROBAXIN) IV       LOS: 1 day   Marylu Lund, MD [facility] Pager On Amion  If 7PM-7AM, please contact night-coverage 03/25/2018, 2:34 PM

## 2018-03-25 NOTE — Progress Notes (Signed)
NG tube removed without difficulty.  Patient indicating he is hungry, but states he understands rationale behind current diet of ice chips and sips of water.  Total output of dark green fluid = 200 (this amt includes ice chips consumed during the night).

## 2018-03-25 NOTE — Progress Notes (Signed)
Patient insisting that he wants to take in clear to full liquids.  Rationale for advancing diet too quickly discussed with him.  Also discussed importance for increased activity and ambulating as much as possible.  Patient states is agreeable to this and wishes to hasten his progress.

## 2018-03-26 ENCOUNTER — Encounter (HOSPITAL_COMMUNITY): Payer: Self-pay | Admitting: General Surgery

## 2018-03-26 LAB — CBC
HCT: 34.3 % — ABNORMAL LOW (ref 39.0–52.0)
HEMOGLOBIN: 11.3 g/dL — AB (ref 13.0–17.0)
MCH: 30.5 pg (ref 26.0–34.0)
MCHC: 32.9 g/dL (ref 30.0–36.0)
MCV: 92.7 fL (ref 80.0–100.0)
NRBC: 0 % (ref 0.0–0.2)
Platelets: 362 10*3/uL (ref 150–400)
RBC: 3.7 MIL/uL — AB (ref 4.22–5.81)
RDW: 15.6 % — ABNORMAL HIGH (ref 11.5–15.5)
WBC: 7.2 10*3/uL (ref 4.0–10.5)

## 2018-03-26 LAB — BASIC METABOLIC PANEL
ANION GAP: 5 (ref 5–15)
BUN: 18 mg/dL (ref 6–20)
CHLORIDE: 98 mmol/L (ref 98–111)
CO2: 31 mmol/L (ref 22–32)
Calcium: 8.7 mg/dL — ABNORMAL LOW (ref 8.9–10.3)
Creatinine, Ser: 1.04 mg/dL (ref 0.61–1.24)
GFR calc Af Amer: >60 mL/min (ref >60)
Glucose, Bld: 91 mg/dL (ref 70–99)
POTASSIUM: 3.7 mmol/L (ref 3.5–5.1)
SODIUM: 134 mmol/L — AB (ref 135–145)

## 2018-03-26 LAB — MAGNESIUM: MAGNESIUM: 1.5 mg/dL — AB (ref 1.7–2.4)

## 2018-03-26 MED ORDER — SPIRONOLACTONE 12.5 MG HALF TABLET
12.5000 mg | ORAL_TABLET | Freq: Every day | ORAL | Status: DC
  Filled 2018-03-26: qty 1

## 2018-03-26 MED ORDER — CARVEDILOL 3.125 MG PO TABS
3.1250 mg | ORAL_TABLET | Freq: Two times a day (BID) | ORAL | Status: DC
  Administered 2018-03-28: 3.125 mg via ORAL
  Filled 2018-03-26 (×3): qty 1

## 2018-03-26 MED ORDER — TORSEMIDE 20 MG PO TABS: 40.0000 mg | ORAL_TABLET | Freq: Every day | ORAL | Status: DC

## 2018-03-26 MED ORDER — MAGNESIUM SULFATE 2 GM/50ML IV SOLN
2.0000 g | Freq: Once | INTRAVENOUS | Status: AC
  Administered 2018-03-26: 2 g via INTRAVENOUS
  Filled 2018-03-26: qty 50

## 2018-03-26 MED ORDER — METOPROLOL TARTRATE 5 MG/5ML IV SOLN: 2.5000 mg | Freq: Four times a day (QID) | INTRAVENOUS | Status: DC

## 2018-03-26 MED ORDER — TRAMADOL HCL 50 MG PO TABS
50.0000 mg | ORAL_TABLET | Freq: Four times a day (QID) | ORAL | Status: DC | PRN
  Administered 2018-03-26: 50 mg via ORAL
  Filled 2018-03-26: qty 1

## 2018-03-26 MED ORDER — DOCUSATE SODIUM 100 MG PO CAPS
100.0000 mg | ORAL_CAPSULE | Freq: Two times a day (BID) | ORAL | Status: DC
  Administered 2018-03-26 – 2018-03-28 (×5): 100 mg via ORAL
  Filled 2018-03-26 (×5): qty 1

## 2018-03-26 MED ORDER — ACETAMINOPHEN 500 MG PO TABS
1000.0000 mg | ORAL_TABLET | Freq: Three times a day (TID) | ORAL | Status: DC
  Administered 2018-03-26 – 2018-03-28 (×7): 1000 mg via ORAL
  Filled 2018-03-26 (×7): qty 2

## 2018-03-26 NOTE — Progress Notes (Signed)
2 Days Post-Op    [id number] pain nausea and vomiting  Subjective: Patient says he is passing gas, incision looks good.  Positive bowel sounds.  Lungs are clear to auscultation he denies any shortness of breath.  Objective: Vital signs in last 24 hours: Temp:  [98.1 F (36.7 C)-100.2 F (37.9 C)] 100.2 F (37.9 C) (11/11 0500) Pulse Rate:  [91-96] 96 (11/11 0500) Resp:  [18] 18 (11/11 0500) BP: (97-108)/(72-78) 97/73 (11/11 0500) SpO2:  [98 %-99 %] 98 % (11/11 0500) Last BM Date: 03/25/18 965 IV recorded 475 urine, 200 NG T-max 100.2, BP 97/73, heart rate in the 90s Potassium 3.7 Creatinine 1.04 WBC 7.2, H/H 11.3/34 Platelets 362,000  Intake/Output from previous day: 11/10 0701 - 11/11 0700 In: 965.4 [I.V.:965.4] Out: 675 [Urine:475; Emesis/NG output:200] Intake/Output this shift: No intake/output data recorded.  General appearance: alert, cooperative and no distress Resp: clear to auscultation bilaterally Cardio: irregularly irregular rhythm and Tw0 strip shows sinus bradycardia GI: Soft positive bowel sounds lower quadrant incision looks good.  Patient reports flatus.  Lab Results:  Recent Labs    03/24/18 2249 03/26/18 0128  WBC 6.5 7.2  HGB 13.5 11.3*  HCT 42.7 34.3*  PLT 410* 362    BMET Recent Labs    03/24/18 2249 03/26/18 0128  NA 135 134*  K 3.2* 3.7  CL 93* 98  CO2 26 31  GLUCOSE 98 91  BUN 20 18  CREATININE 1.12  1.13 1.04  CALCIUM 8.7* 8.7*   PT/INR Recent Labs    03/24/18 1435  LABPROT 16.8*  INR 1.37    Recent Labs  Lab 03/19/18 0848 03/24/18 1435  AST 34 34  ALT 32 32  ALKPHOS 121 129*  BILITOT 3.4* 3.8*  PROT 7.9 8.1  ALBUMIN 2.0* 2.4*     Lipase     Component Value Date/Time   LIPASE 34 03/24/2018 1435     Prior to Admission medications   Medication Sig Start Date End Date Taking? Authorizing Provider  carvedilol (COREG) 3.125 MG tablet Take 1 tablet (3.125 mg total) by mouth 2 (two) times daily with a  meal. 03/20/18  Yes Bonnielee Haff, MD  docusate sodium (COLACE) 100 MG capsule Take 1 capsule (100 mg total) by mouth 2 (two) times daily. 03/20/18  Yes Bonnielee Haff, MD  folic acid (FOLVITE) 1 MG tablet Take 1 tablet (1 mg total) by mouth daily. 03/20/18  Yes Bonnielee Haff, MD  losartan (COZAAR) 25 MG tablet Take 0.5 tablets (12.5 mg total) by mouth daily. 03/20/18  Yes Bonnielee Haff, MD  Multiple Vitamin (MULTIVITAMIN WITH MINERALS) TABS tablet Take 1 tablet by mouth daily. 03/20/18  Yes Bonnielee Haff, MD  spironolactone (ALDACTONE) 25 MG tablet Take 0.5 tablets (12.5 mg total) by mouth daily. 03/20/18  Yes Bonnielee Haff, MD  torsemide (DEMADEX) 20 MG tablet Take 2 tablets (40 mg total) by mouth daily. 03/20/18  Yes Bonnielee Haff, MD  polyethylene glycol [facility] / GLYCOLAX) packet Take 17 g by mouth daily as needed. 03/20/18   Bonnielee Haff, MD    Medications: . enoxaparin (LOVENOX) injection  40 mg Subcutaneous Q24H  . pantoprazole (PROTONIX) IV  40 mg Intravenous QHS   . dextrose 5 % and 0.45 % NaCl with KCl 20 mEq/L 50 mL/hr at 03/26/18 0400  . methocarbamol (ROBAXIN) IV     Anti-infectives (From admission, onward)   Start     Dose/Rate Route Frequency Ordered Stop   03/24/18 1800  ciprofloxacin (CIPRO) IVPB 400 mg  400 mg 200 mL/hr over 60 Minutes Intravenous  Once 03/24/18 1758 03/24/18 1930    Assessment/Plan Chronic systolic congestive heart failure(EF 20 to 25%, severe MR TR RVEF depression) Cirrhosis Hypertension Hyponatremia  Incarcerated left inguinal hernia with small bowel obstruction REPAIR INCARCERATED LEFT INGUINAL HERNIA, INSERTION OF MESH, 03/24/18, Dr. Georganna Skeans POD #2  FEN: IV fluids/n.p.o. ID: Cipro preop DVT: Lovenox Follow-up: Dr. Grandville Silos   Plan: Clear liquids, can start him on low-dose Lopressor IV until we can restart his Coreg.  I-S 10 times per hour.  He may use between 750- 1200 currently on I-S.  Mobilize.       LOS: 2 days     Chauntelle Azpeitia 03/26/2018 [phone]

## 2018-03-26 NOTE — Plan of Care (Signed)

## 2018-03-26 NOTE — Progress Notes (Signed)
PROGRESS NOTE    Kyle Conrad  EZM:[id number] DOB: Nov 12, 1965 DOA: 03/24/2018 PCP: Dorena Dew, FNP    Brief Narrative:  52 y.o. male with medical history significant for nonischemic cardiomyopathy, hypertension, cirrhosis, obstructive sleep apnea, and chronic systolic CHF, discharged from the medicine service on 03/20/2018 after admission for acute CHF, and now presenting with abdominal pain and nausea with vomiting.  Patient was diuresed 72 pounds during the recent admission, reports developing pain across the upper abdomen shortly after returning home, and has since developed nausea with recurrent vomiting.  He also reports a one-year history of left groin mass that seems to come and go.  Upon arrival to the emergency department, he had stable vital signs, new mild thrombocytosis, mild elevation in lactic acid, improved hyponatremia, stable elevations and total bilirubin and INR, and CT findings concerning for SBO secondary to incarcerated left inguinal hernia.  Surgery was consulted and the patient was taken for emergent surgical repair.  Patient has tolerated the procedure well with no immediate complications and hospitalists are asked to consult for management of his advanced medical comorbidities.  Assessment & Plan:   Principal Problem:   Incarcerated left inguinal hernia Active Problems:   Chronic systolic CHF (congestive heart failure) (HCC)   HTN (hypertension)   Hyponatremia   Other cirrhosis of liver (HCC)   Indirect inguinal hernia   SBO (small bowel obstruction) ([place])  1. Incarcerated left inguinal hernia with SBO  - Presents with abdominal pain and N/V, found to have SBO secondary to incarcerated left inguinal hernia and is now immediately status-post emergent surgical repair - Ongoing management per surgery  - started diet today  2. Chronic systolic CHF  - Discharged 11/5 after admission for acute CHF in setting of not taking medications for several months  -  He had marked hypervolemia, lost 70 lbs on Lasix gtt, and echo revealed EF 20-25% with severe MR, TR, and RVEF depression  - He was net neg 1200 cc in OR and appears euvolemic or slightly hypovolemic after surgery  -diet resumed - Wt today 69.2kg, was 75kg at time of recent d/c for CHF exacerbation -Continue to hold diuretic  3. Cirrhosis  - Denies hx of viral hepatitis or EtOH abuse and viral hepatitis panel negative last week  - Possibly congestive hepatopathy given severely depressed RVEF  - Appears compensated  - Ascites noted on CT -Stable currently -Resume diuretics in the next 24-48hrs  4. Hypertension  - BP elevated after surgery; pain may be contributing  - Continue pain-control, use hydralazine IVP's as-needed while NPO   -BP currently well controlled  5. Hyponatremia  - Serum sodium is 132 on admission, improved from recent admission and likely secondary to CHF and/or cirrhosis  -Labs reviewed, sodium normalized, stable  6. Hypokalemia -Receiving K in IVF -Will repeat bmet in AM, continue to correct electrolytes as needed  DVT prophylaxis: Lovenox subQ Code Status: Full Family Communication: Pt in room, friend at bedside Disposition Plan: Uncertain at this time  Consultants:     Procedures:   Inguinal hernia repair  Antimicrobials: Anti-infectives (From admission, onward)   Start     Dose/Rate Route Frequency Ordered Stop   03/24/18 1800  ciprofloxacin (CIPRO) IVPB 400 mg     400 mg 200 mL/hr over 60 Minutes Intravenous  Once 03/24/18 1758 03/24/18 1930      Subjective: Without complaints at this time  Objective: Vitals:   03/25/18 2122 03/26/18 0500 03/26/18 1142 03/26/18 1306  BP: 108/78  97/73  90/62  Pulse: 92 96  84  Resp: 18 18  18   Temp: 98.7 F (37.1 C) 100.2 F (37.9 C)  99.8 F (37.7 C)  TempSrc: Oral Oral  Axillary  SpO2: 99% 98%  100%  Weight:   69.2 kg   Height:        Intake/Output Summary (Last 24 hours) at 03/26/2018  1650 Last data filed at 03/26/2018 1400 Gross per 24 hour  Intake 1392.23 ml  Output 100 ml  Net 1292.23 ml   Filed Weights   03/26/18 1142  Weight: 69.2 kg    Examination: General exam: Awake, laying in bed, in nad Respiratory system: Normal respiratory effort, no wheezing Cardiovascular system: regular rate, s1, s2 Gastrointestinal system: Soft, nondistended, positive BS Central nervous system: CN2-12 grossly intact, strength intact Extremities: Perfused, no clubbing Skin: Normal skin turgor, no notable skin lesions seen Psychiatry: Mood normal // no visual hallucinations   Data Reviewed: I have personally reviewed following labs and imaging studies  CBC: Recent Labs  Lab 03/24/18 1435 03/24/18 2249 03/26/18 0128  WBC 5.7 6.5 7.2  NEUTROABS 3.7  --   --   HGB 14.6 13.5 11.3*  HCT 43.7 42.7 34.3*  MCV 91.2 93.6 92.7  PLT 414* 410* 867   Basic Metabolic Panel: Recent Labs  Lab 03/20/18 0227 03/24/18 1435 03/24/18 2249 03/26/18 0128  NA 129* 132* 135 134*  K 4.0 3.5 3.2* 3.7  CL 93* 89* 93* 98  CO2 29 30 26 31   GLUCOSE 90 95 98 91  BUN 17 20 20 18   CREATININE 0.92 1.22 1.12  1.13 1.04  CALCIUM 8.2* 9.0 8.7* 8.7*  MG  --   --   --  1.5*   GFR: Estimated Creatinine Clearance: 79.5 mL/min (by C-G formula based on SCr of 1.04 mg/dL). Liver Function Tests: Recent Labs  Lab 03/24/18 1435  AST 34  ALT 32  ALKPHOS 129*  BILITOT 3.8*  PROT 8.1  ALBUMIN 2.4*   Recent Labs  Lab 03/24/18 1435  LIPASE 34   Recent Labs  Lab 03/24/18 1435  AMMONIA 25   Coagulation Profile: Recent Labs  Lab 03/24/18 1435  INR 1.37   Cardiac Enzymes: No results for input(s): CKTOTAL, CKMB, CKMBINDEX, TROPONINI in the last 168 hours. BNP (last 3 results) No results for input(s): PROBNP in the last 8760 hours. HbA1C: No results for input(s): HGBA1C in the last 72 hours. CBG: No results for input(s): GLUCAP in the last 168 hours. Lipid Profile: No results for  input(s): CHOL, HDL, LDLCALC, TRIG, CHOLHDL, LDLDIRECT in the last 72 hours. Thyroid Function Tests: No results for input(s): TSH, T4TOTAL, FREET4, T3FREE, THYROIDAB in the last 72 hours. Anemia Panel: No results for input(s): VITAMINB12, FOLATE, FERRITIN, TIBC, IRON, RETICCTPCT in the last 72 hours. Sepsis Labs: Recent Labs  Lab 03/24/18 1507 03/24/18 1758  LATICACIDVEN 2.26* 1.47    Recent Results (from the past 240 hour(s))  Urine culture     Status: Abnormal   Collection Time: 03/24/18  4:03 PM  Result Value Ref Range Status   Specimen Description URINE, RANDOM  Final   Special Requests NONE  Final   Culture (A)  Final    <10,000 COLONIES/mL INSIGNIFICANT GROWTH Performed at [facility], [place] [address] [place], [place] [address]    Report Status 03/25/2018 FINAL  Final     Radiology Studies: X-ray Abdomen Ap  Result Date: 03/24/2018 CLINICAL DATA:  Nasogastric tube placement. EXAM: ABDOMEN -  1 VIEW COMPARISON:  Abdominal radiograph performed 03/19/2018, and CT of the abdomen and pelvis performed 03/24/2018 FINDINGS: The patient's enteric tube is noted coiled overlying the body of the stomach. The visualized bowel gas pattern is nonspecific, with a single distended loop of small bowel at the left upper quadrant, and stool filling the ascending colon. There is no definite evidence for bowel obstruction. No free intra-abdominal air is identified, though evaluation for free air is limited on a single supine view. The visualized osseous structures are within normal limits; the sacroiliac joints are unremarkable in appearance. IMPRESSION: 1. Enteric tube noted coiled overlying the body of the stomach. 2. Nonspecific bowel gas pattern, with a single distended loop of small bowel at the left upper quadrant, and stool filling the ascending colon. Electronically Signed   By: Garald Balding M.D.   On: 03/24/2018 21:22    Scheduled Meds: . acetaminophen  1,000 mg Oral Q8H  .  carvedilol  3.125 mg Oral BID WC  . docusate sodium  100 mg Oral BID  . enoxaparin (LOVENOX) injection  40 mg Subcutaneous Q24H  . pantoprazole (PROTONIX) IV  40 mg Intravenous QHS  . [START ON 03/27/2018] spironolactone  12.5 mg Oral Daily  . [START ON 03/27/2018] torsemide  40 mg Oral Daily   Continuous Infusions: . methocarbamol (ROBAXIN) IV       LOS: 2 days   Marylu Lund, MD [facility] Pager On Amion  If 7PM-7AM, please contact night-coverage 03/26/2018, 4:50 PM

## 2018-03-26 NOTE — Progress Notes (Signed)
Pt wound site dry and intact left inguinal hernia, scrotum swelling noted, given pain med as scheduled tylenol.

## 2018-03-27 ENCOUNTER — Other Ambulatory Visit: Payer: Self-pay

## 2018-03-27 ENCOUNTER — Inpatient Hospital Stay (HOSPITAL_COMMUNITY): Payer: Self-pay

## 2018-03-27 LAB — BASIC METABOLIC PANEL
Anion gap: 6 (ref 5–15)
BUN: 15 mg/dL (ref 6–20)
CO2: 29 mmol/L (ref 22–32)
CREATININE: 0.97 mg/dL (ref 0.61–1.24)
Calcium: 8.3 mg/dL — ABNORMAL LOW (ref 8.9–10.3)
Chloride: 97 mmol/L — ABNORMAL LOW (ref 98–111)
GFR calc Af Amer: >60 mL/min (ref >60)
GLUCOSE: 93 mg/dL (ref 70–99)
Potassium: 3.6 mmol/L (ref 3.5–5.1)
SODIUM: 132 mmol/L — AB (ref 135–145)

## 2018-03-27 LAB — CBC
HEMATOCRIT: 32.3 % — AB (ref 39.0–52.0)
HEMOGLOBIN: 10.5 g/dL — AB (ref 13.0–17.0)
MCH: 30.3 pg (ref 26.0–34.0)
MCHC: 32.5 g/dL (ref 30.0–36.0)
MCV: 93.4 fL (ref 80.0–100.0)
Platelets: 375 10*3/uL (ref 150–400)
RBC: 3.46 MIL/uL — ABNORMAL LOW (ref 4.22–5.81)
RDW: 15.2 % (ref 11.5–15.5)
WBC: 7.6 10*3/uL (ref 4.0–10.5)
nRBC: 0 % (ref 0.0–0.2)

## 2018-03-27 MED ORDER — SPIRONOLACTONE 12.5 MG HALF TABLET
12.5000 mg | ORAL_TABLET | Freq: Every day | ORAL | Status: DC
  Administered 2018-03-28: 12.5 mg via ORAL
  Filled 2018-03-27: qty 1

## 2018-03-27 MED ORDER — PANTOPRAZOLE SODIUM 40 MG PO TBEC
40.0000 mg | DELAYED_RELEASE_TABLET | Freq: Every day | ORAL | Status: DC
  Administered 2018-03-27: 40 mg via ORAL
  Filled 2018-03-27: qty 1

## 2018-03-27 MED ORDER — IBUPROFEN 600 MG PO TABS
600.0000 mg | ORAL_TABLET | Freq: Four times a day (QID) | ORAL | Status: DC | PRN
  Administered 2018-03-27: 600 mg via ORAL
  Filled 2018-03-27: qty 1

## 2018-03-27 MED ORDER — FOLIC ACID 1 MG PO TABS
1.0000 mg | ORAL_TABLET | Freq: Every day | ORAL | Status: DC
  Administered 2018-03-27 – 2018-03-28 (×2): 1 mg via ORAL
  Filled 2018-03-27 (×2): qty 1

## 2018-03-27 MED ORDER — DOCUSATE SODIUM 100 MG PO CAPS: 100.0000 mg | ORAL_CAPSULE | Freq: Two times a day (BID) | ORAL | Status: DC

## 2018-03-27 MED ORDER — HYDROCERIN EX CREA
TOPICAL_CREAM | Freq: Two times a day (BID) | CUTANEOUS | Status: DC
  Administered 2018-03-27 – 2018-03-28 (×2): via TOPICAL
  Filled 2018-03-27: qty 113

## 2018-03-27 MED ORDER — TORSEMIDE 20 MG PO TABS
40.0000 mg | ORAL_TABLET | Freq: Every day | ORAL | Status: DC
  Administered 2018-03-28: 40 mg via ORAL
  Filled 2018-03-27: qty 2

## 2018-03-27 MED ORDER — LOSARTAN POTASSIUM 25 MG PO TABS: 12.5000 mg | ORAL_TABLET | Freq: Every day | ORAL | Status: DC

## 2018-03-27 MED ORDER — SODIUM CHLORIDE 0.9 % IV BOLUS
500.0000 mL | Freq: Once | INTRAVENOUS | Status: AC
  Administered 2018-03-27: 500 mL via INTRAVENOUS

## 2018-03-27 MED ORDER — POLYETHYLENE GLYCOL 3350 17 G PO PACK
17.0000 g | PACK | Freq: Every day | ORAL | Status: DC | PRN
  Administered 2018-03-27: 17 g via ORAL
  Filled 2018-03-27: qty 1

## 2018-03-27 MED ORDER — TRAMADOL HCL 50 MG PO TABS: 50.0000 mg | ORAL_TABLET | Freq: Four times a day (QID) | ORAL | Status: DC | PRN

## 2018-03-27 NOTE — Progress Notes (Addendum)
Paged provider notification BP 88/63, HR 80.  2200 orders placed for 538mL NS bolus.

## 2018-03-27 NOTE — Progress Notes (Signed)
3 drops of Lavender essential oil applied to cool gauze compresses and wrapped edematous scrotum.  Eucerin cream ordered for dry flaky skin on bilateral feet.  Pt instructed on how to use Eucerin cream at home.

## 2018-03-27 NOTE — Progress Notes (Signed)
Patient has new scrotal swelling. Paged Dr Redmond Pulling on call and made aware

## 2018-03-27 NOTE — Progress Notes (Signed)
3 Days Post-Op    UX:[id number] pain nausea and vomiting  Subjective: Tolerating clears, sites looks fine, he had a small BM yesterday.  Objective: Vital signs in last 24 hours: Temp:  [98.7 F (37.1 C)-99.8 F (37.7 C)] 98.9 F (37.2 C) (11/12 0458) Pulse Rate:  [76-90] 90 (11/12 0458) Resp:  [16-18] 16 (11/12 0458) BP: (90-93)/(62-65) 93/65 (11/12 0458) SpO2:  [99 %-100 %] 99 % (11/12 0458) Weight:  [69.2 kg-73.9 kg] 73.9 kg (11/12 0500) Last BM Date: 03/25/18 510 PO Urine x 4 Stool x 1 Afebrile since 3AM temp yesterday Na 132, K+ 3.6 CBC OK  Intake/Output from previous day: 11/11 0701 - 11/12 0700 In: 997.2 [P.O.:510; I.V.:437.2; IV Piggyback:50] Out: -  Intake/Output this shift: No intake/output data recorded.  General appearance: alert, cooperative and no distress Resp: clear to auscultation bilaterally Cardio: sinus brady with some paired PVC's x 1 GI: soft, non-tender; bowel sounds normal; no masses,  no organomegaly and left groin is sore but looks good.  Lab Results:  Recent Labs    03/26/18 0128 03/27/18 0413  WBC 7.2 7.6  HGB 11.3* 10.5*  HCT 34.3* 32.3*  PLT 362 375    BMET Recent Labs    03/26/18 0128 03/27/18 0413  NA 134* 132*  K 3.7 3.6  CL 98 97*  CO2 31 29  GLUCOSE 91 93  BUN 18 15  CREATININE 1.04 0.97  CALCIUM 8.7* 8.3*   PT/INR Recent Labs    03/24/18 1435  LABPROT 16.8*  INR 1.37    Recent Labs  Lab 03/24/18 1435  AST 34  ALT 32  ALKPHOS 129*  BILITOT 3.8*  PROT 8.1  ALBUMIN 2.4*     Lipase     Component Value Date/Time   LIPASE 34 03/24/2018 1435     Medications: . acetaminophen  1,000 mg Oral Q8H  . carvedilol  3.125 mg Oral BID WC  . docusate sodium  100 mg Oral BID  . enoxaparin (LOVENOX) injection  40 mg Subcutaneous Q24H  . pantoprazole (PROTONIX) IV  40 mg Intravenous QHS  . spironolactone  12.5 mg Oral Daily  . torsemide  40 mg Oral Daily    Assessment/Plan  Chronic systolic congestive  heart failure(EF 20 to 25%, severe MR TR RVEF depression) Cirrhosis Hypertension Hyponatremia  Incarcerated left inguinal hernia with small bowel obstruction REPAIR INCARCERATED LEFT INGUINAL HERNIA, INSERTION OF MESH, 03/24/18, Dr. Georganna Skeans POD #3  FEN: IV fluids/clears ID: Cipro preop DVT: Lovenox Follow-up: Dr. Grandville Silos   Plan:  Advance diet and if he does well home later today or in AM.  He lives alone. He is back on all his home meds except Cozaar. I ordered it but on review  the BP is in the 90's now.  Will let Medicine decide on restarting that.      LOS: 3 days    Cache Decoursey 03/27/2018 [phone]

## 2018-03-27 NOTE — Progress Notes (Signed)
PROGRESS NOTE    Kyle Conrad  MEQ:[id number] DOB: 1965/11/22 DOA: 03/24/2018 PCP: Dorena Dew, FNP    Brief Narrative:  52 y.o. male with medical history significant for nonischemic cardiomyopathy, hypertension, cirrhosis, obstructive sleep apnea, and chronic systolic CHF, discharged from the medicine service on 03/20/2018 after admission for acute CHF, and now presenting with abdominal pain and nausea with vomiting.  Patient was diuresed 72 pounds during the recent admission, reports developing pain across the upper abdomen shortly after returning home, and has since developed nausea with recurrent vomiting.  He also reports a one-year history of left groin mass that seems to come and go.  Upon arrival to the emergency department, he had stable vital signs, new mild thrombocytosis, mild elevation in lactic acid, improved hyponatremia, stable elevations and total bilirubin and INR, and CT findings concerning for SBO secondary to incarcerated left inguinal hernia.  Surgery was consulted and the patient was taken for emergent surgical repair.  Patient has tolerated the procedure well with no immediate complications and hospitalists are asked to consult for management of his advanced medical comorbidities.  Assessment & Plan:   Principal Problem:   Incarcerated left inguinal hernia Active Problems:   Chronic systolic CHF (congestive heart failure) (HCC)   HTN (hypertension)   Hyponatremia   Other cirrhosis of liver (HCC)   Indirect inguinal hernia   SBO (small bowel obstruction) ([place])  1. Incarcerated left inguinal hernia with SBO  - Presents with abdominal pain and N/V, found to have SBO secondary to incarcerated left inguinal hernia and is now immediately status-post emergent surgical repair - Ongoing management per surgery  - Advancing diet per General Surgery  2. Chronic systolic CHF  - Discharged 11/5 after admission for acute CHF in setting of not taking medications for  several months  - He had marked hypervolemia, lost 70 lbs on Lasix gtt, and echo revealed EF 20-25% with severe MR, TR, and RVEF depression  - He was net neg 1200 cc in OR and appears euvolemic or slightly hypovolemic after surgery  -diet resumed - Wt today 73kg, was 69kg yesterday and 76kg at time of recent d/c for CHF exacerbation -Continue to hold diuretic, anticipate resuming 11/13, provided BP tolerates  3. Cirrhosis  - Denies hx of viral hepatitis or EtOH abuse and viral hepatitis panel negative last week  - Possibly congestive hepatopathy given severely depressed RVEF  - Appears compensated  - Ascites noted on CT - Presently stable - Resume diuretics in the next 24-48hrs  4. Hypertension  - BP elevated after surgery; pain may be contributing  - Continue pain-control, use hydralazine IVP's as-needed while NPO   - BP borderline low. Coreg with hold parameters -Diuretics remain on hold per above. Anticipate resuming 11/13 if BP permits  5. Hyponatremia  - Serum sodium is 132 on admission, improved from recent admission and likely secondary to CHF and/or cirrhosis  -Labs were reviewed. Sodium had normalized  6. Hypokalemia -Receiving K in IVF -Labs reviewed. Improved -Will repeat bmet in AM  DVT prophylaxis: Lovenox subQ Code Status: Full Family Communication: Pt in room, friend at bedside Disposition Plan: Uncertain at this time  Consultants:   General Surgery  Procedures:   Inguinal hernia repair  Antimicrobials: Anti-infectives (From admission, onward)   Start     Dose/Rate Route Frequency Ordered Stop   03/24/18 1800  ciprofloxacin (CIPRO) IVPB 400 mg     400 mg 200 mL/hr over 60 Minutes Intravenous  Once 03/24/18 1758 03/24/18  1930      Subjective: Tolerating diet. Complaining of feeling constipated  Objective: Vitals:   03/27/18 0458 03/27/18 0500 03/27/18 0800 03/27/18 1015  BP: 93/65  (!) 89/62 98/62  Pulse: 90   82  Resp: 16     Temp: 98.9  F (37.2 C)  100 F (37.8 C) 99.5 F (37.5 C)  TempSrc: Oral  Oral Oral  SpO2: 99%  100% 99%  Weight:  73.9 kg    Height:        Intake/Output Summary (Last 24 hours) at 03/27/2018 1031 Last data filed at 03/27/2018 0950 Gross per 24 hour  Intake 997.19 ml  Output -  Net 997.19 ml   Filed Weights   03/26/18 1142 03/27/18 0500  Weight: 69.2 kg 73.9 kg    Examination: General exam: Conversant, in no acute distress Respiratory system: normal chest rise, clear, no audible wheezing Cardiovascular system: regular rhythm, s1-s2 Gastrointestinal system: Nondistended, nontender, pos BS Central nervous system: No seizures, no tremors Extremities: No cyanosis, no joint deformities Skin: No rashes, no pallor Psychiatry: Affect normal // no auditory hallucinations   Data Reviewed: I have personally reviewed following labs and imaging studies  CBC: Recent Labs  Lab 03/24/18 1435 03/24/18 2249 03/26/18 0128 03/27/18 0413  WBC 5.7 6.5 7.2 7.6  NEUTROABS 3.7  --   --   --   HGB 14.6 13.5 11.3* 10.5*  HCT 43.7 42.7 34.3* 32.3*  MCV 91.2 93.6 92.7 93.4  PLT 414* 410* 362 283   Basic Metabolic Panel: Recent Labs  Lab 03/24/18 1435 03/24/18 2249 03/26/18 0128 03/27/18 0413  NA 132* 135 134* 132*  K 3.5 3.2* 3.7 3.6  CL 89* 93* 98 97*  CO2 30 26 31 29   GLUCOSE 95 98 91 93  BUN 20 20 18 15   CREATININE 1.22 1.12  1.13 1.04 0.97  CALCIUM 9.0 8.7* 8.7* 8.3*  MG  --   --  1.5*  --    GFR: Estimated Creatinine Clearance: 91 mL/min (by C-G formula based on SCr of 0.97 mg/dL). Liver Function Tests: Recent Labs  Lab 03/24/18 1435  AST 34  ALT 32  ALKPHOS 129*  BILITOT 3.8*  PROT 8.1  ALBUMIN 2.4*   Recent Labs  Lab 03/24/18 1435  LIPASE 34   Recent Labs  Lab 03/24/18 1435  AMMONIA 25   Coagulation Profile: Recent Labs  Lab 03/24/18 1435  INR 1.37   Cardiac Enzymes: No results for input(s): CKTOTAL, CKMB, CKMBINDEX, TROPONINI in the last 168 hours. BNP  (last 3 results) No results for input(s): PROBNP in the last 8760 hours. HbA1C: No results for input(s): HGBA1C in the last 72 hours. CBG: No results for input(s): GLUCAP in the last 168 hours. Lipid Profile: No results for input(s): CHOL, HDL, LDLCALC, TRIG, CHOLHDL, LDLDIRECT in the last 72 hours. Thyroid Function Tests: No results for input(s): TSH, T4TOTAL, FREET4, T3FREE, THYROIDAB in the last 72 hours. Anemia Panel: No results for input(s): VITAMINB12, FOLATE, FERRITIN, TIBC, IRON, RETICCTPCT in the last 72 hours. Sepsis Labs: Recent Labs  Lab 03/24/18 1507 03/24/18 1758  LATICACIDVEN 2.26* 1.47    Recent Results (from the past 240 hour(s))  Urine culture     Status: Abnormal   Collection Time: 03/24/18  4:03 PM  Result Value Ref Range Status   Specimen Description URINE, RANDOM  Final   Special Requests NONE  Final   Culture (A)  Final    <10,000 COLONIES/mL INSIGNIFICANT GROWTH Performed at [facility]  [facility], [place] [address], [place], [place] [address]    Report Status 03/25/2018 FINAL  Final     Radiology Studies: No results found.  Scheduled Meds: . acetaminophen  1,000 mg Oral Q8H  . carvedilol  3.125 mg Oral BID WC  . docusate sodium  100 mg Oral BID  . enoxaparin (LOVENOX) injection  40 mg Subcutaneous Q24H  . folic acid  1 mg Oral Daily  . pantoprazole  40 mg Oral QHS  . [START ON 03/28/2018] spironolactone  12.5 mg Oral Daily  . [START ON 03/28/2018] torsemide  40 mg Oral Daily   Continuous Infusions: . methocarbamol (ROBAXIN) IV       LOS: 3 days   Marylu Lund, MD [facility] Pager On Amion  If 7PM-7AM, please contact night-coverage 03/27/2018, 10:31 AM

## 2018-03-28 DIAGNOSIS — I5022 Chronic systolic (congestive) heart failure: Secondary | ICD-10-CM

## 2018-03-28 DIAGNOSIS — E871 Hypo-osmolality and hyponatremia: Secondary | ICD-10-CM

## 2018-03-28 LAB — COMPREHENSIVE METABOLIC PANEL
ALBUMIN: 1.9 g/dL — AB (ref 3.5–5.0)
ALT: 19 U/L (ref 0–44)
AST: 17 U/L (ref 15–41)
Alkaline Phosphatase: 98 U/L (ref 38–126)
Anion gap: 6 (ref 5–15)
BILIRUBIN TOTAL: 2.7 mg/dL — AB (ref 0.3–1.2)
BUN: 14 mg/dL (ref 6–20)
CHLORIDE: 96 mmol/L — AB (ref 98–111)
CO2: 26 mmol/L (ref 22–32)
Calcium: 7.9 mg/dL — ABNORMAL LOW (ref 8.9–10.3)
Creatinine, Ser: 0.83 mg/dL (ref 0.61–1.24)
GFR calc Af Amer: >60 mL/min (ref >60)
GFR calc non Af Amer: >60 mL/min (ref >60)
GLUCOSE: 84 mg/dL (ref 70–99)
POTASSIUM: 3.6 mmol/L (ref 3.5–5.1)
SODIUM: 128 mmol/L — AB (ref 135–145)
TOTAL PROTEIN: 6.2 g/dL — AB (ref 6.5–8.1)

## 2018-03-28 LAB — MAGNESIUM: Magnesium: 1.6 mg/dL — ABNORMAL LOW (ref 1.7–2.4)

## 2018-03-28 MED ORDER — IBUPROFEN 200 MG PO TABS: ORAL_TABLET | ORAL | Status: DC

## 2018-03-28 MED ORDER — TRAMADOL HCL 50 MG PO TABS: 50.0000 mg | ORAL_TABLET | Freq: Four times a day (QID) | ORAL | 0 refills | Status: DC | PRN

## 2018-03-28 MED ORDER — ACETAMINOPHEN 500 MG PO TABS: ORAL_TABLET | ORAL | 0 refills | Status: AC

## 2018-03-28 MED ORDER — POLYETHYLENE GLYCOL 3350 17 G PO PACK: PACK | ORAL | 0 refills | Status: DC

## 2018-03-28 NOTE — Progress Notes (Signed)
PROGRESS NOTE    Kyle Conrad  URK:[id number] DOB: 1965/12/18 DOA: 03/24/2018 PCP: Dorena Dew, FNP    Brief Narrative:  52 y.o. male with medical history significant for nonischemic cardiomyopathy, hypertension, cirrhosis, obstructive sleep apnea, and chronic systolic CHF, discharged from the medicine service on 03/20/2018 after admission for acute CHF, and now presenting with abdominal pain and nausea with vomiting.  Patient was diuresed 72 pounds during the recent admission, reports developing pain across the upper abdomen shortly after returning home, and has since developed nausea with recurrent vomiting.  He also reports a one-year history of left groin mass that seems to come and go.  Upon arrival to the emergency department, he had stable vital signs, new mild thrombocytosis, mild elevation in lactic acid, improved hyponatremia, stable elevations and total bilirubin and INR, and CT findings concerning for SBO secondary to incarcerated left inguinal hernia.  Surgery was consulted and the patient was taken for emergent surgical repair.  Patient has tolerated the procedure well with no immediate complications and hospitalists are asked to consult for management of his advanced medical comorbidities.  Assessment & Plan:   Principal Problem:   Incarcerated left inguinal hernia Active Problems:   Chronic systolic CHF (congestive heart failure) (HCC)   HTN (hypertension)   Hyponatremia   Other cirrhosis of liver (HCC)   Indirect inguinal hernia   SBO (small bowel obstruction) ([place])   1. Incarcerated left inguinal hernia with SBO  - Presents with abdominal pain and N/V, found to have SBO secondary to incarcerated left inguinal hernia and is now immediately status-post emergent surgical repair Further management per general surgery.  2. Chronic systolic CHF  - Discharged 11/5 after admission for acute CHF in setting of not taking medications for several months  - He had marked  hypervolemia, lost 70 lbs on Lasix gtt, and echo revealed EF 20-25% with severe MR, TR, and RVEF depression  - Patient may resume his home medications at discharge.  Cardiac status appears to be stable.  Clinically similar to how he was when he was discharged from the hospital on 11/5.  3.  Recently detected liver cirrhosis  - Denies hx of viral hepatitis or EtOH abuse and viral hepatitis panel negative last week  - Possibly congestive hepatopathy given severely depressed RVEF  - Appears compensated  - Ascites noted on CT Stable.  Outpatient evaluation.  4.  Hypertension  - BP elevated after surgery; most likely due to pain.  He usually has borderline low blood pressures.  5. Hyponatremia  -Sodium level remains low but stable.  Most likely due to CHF.  Cirrhotic changes could also be contributing.  6. Hypokalemia Improved potassium level.  Patient remains medically stable.  Discussed with General Surgery PA.  Okay from our standpoint for discharge.   Consultants:   General Surgery  Procedures:   Inguinal hernia repair  Antimicrobials: Anti-infectives (From admission, onward)   Start     Dose/Rate Route Frequency Ordered Stop   03/24/18 1800  ciprofloxacin (CIPRO) IVPB 400 mg     400 mg 200 mL/hr over 60 Minutes Intravenous  Once 03/24/18 1758 03/24/18 1930      Subjective: Patient feels well.  Denies any nausea vomiting abdominal pain.  Passing gas.  No bowel movement yet.  Objective: Vitals:   03/27/18 1500 03/27/18 2103 03/28/18 0133 03/28/18 0530  BP: 96/64 (!) 88/63 97/72 94/66   Pulse: 90 80 96 89  Resp:  16  16  Temp: 99.4 F (37.4 C)  98.3 F (36.8 C)  98.5 F (36.9 C)  TempSrc: Oral Oral  Oral  SpO2: 100% 100%  100%  Weight:      Height:        Intake/Output Summary (Last 24 hours) at 03/28/2018 1015 Last data filed at 03/28/2018 0530 Gross per 24 hour  Intake 1210.11 ml  Output 450 ml  Net 760.11 ml   Filed Weights   03/26/18 1142 03/27/18  0500  Weight: 69.2 kg 73.9 kg    Examination: Awake alert.  In no distress Lungs are clear to auscultation bilaterally.  Normal effort S1-S2 is normal regular.  No S3-S4.  No rubs murmurs or bruit Abdomen is soft.  Nontender  Data Reviewed: I have personally reviewed following labs and imaging studies  CBC: Recent Labs  Lab 03/24/18 1435 03/24/18 2249 03/26/18 0128 03/27/18 0413  WBC 5.7 6.5 7.2 7.6  NEUTROABS 3.7  --   --   --   HGB 14.6 13.5 11.3* 10.5*  HCT 43.7 42.7 34.3* 32.3*  MCV 91.2 93.6 92.7 93.4  PLT 414* 410* 362 937   Basic Metabolic Panel: Recent Labs  Lab 03/24/18 1435 03/24/18 2249 03/26/18 0128 03/27/18 0413 03/28/18 0142  NA 132* 135 134* 132* 128*  K 3.5 3.2* 3.7 3.6 3.6  CL 89* 93* 98 97* 96*  CO2 30 26 31 29 26   GLUCOSE 95 98 91 93 84  BUN 20 20 18 15 14   CREATININE 1.22 1.12  1.13 1.04 0.97 0.83  CALCIUM 9.0 8.7* 8.7* 8.3* 7.9*  MG  --   --  1.5*  --  1.6*   GFR: Estimated Creatinine Clearance: 106.3 mL/min (by C-G formula based on SCr of 0.83 mg/dL). Liver Function Tests: Recent Labs  Lab 03/24/18 1435 03/28/18 0142  AST 34 17  ALT 32 19  ALKPHOS 129* 98  BILITOT 3.8* 2.7*  PROT 8.1 6.2*  ALBUMIN 2.4* 1.9*   Recent Labs  Lab 03/24/18 1435  LIPASE 34   Recent Labs  Lab 03/24/18 1435  AMMONIA 25   Coagulation Profile: Recent Labs  Lab 03/24/18 1435  INR 1.37   Sepsis Labs: Recent Labs  Lab 03/24/18 1507 03/24/18 1758  LATICACIDVEN 2.26* 1.47    Recent Results (from the past 240 hour(s))  Urine culture     Status: Abnormal   Collection Time: 03/24/18  4:03 PM  Result Value Ref Range Status   Specimen Description URINE, RANDOM  Final   Special Requests NONE  Final   Culture (A)  Final    <10,000 COLONIES/mL INSIGNIFICANT GROWTH Performed at [facility], [place] [address], [place], [place] [address]    Report Status 03/25/2018 FINAL  Final     Radiology Studies: No results found.  Scheduled  Meds: . acetaminophen  1,000 mg Oral Q8H  . carvedilol  3.125 mg Oral BID WC  . docusate sodium  100 mg Oral BID  . enoxaparin (LOVENOX) injection  40 mg Subcutaneous Q24H  . folic acid  1 mg Oral Daily  . hydrocerin   Topical BID  . pantoprazole  40 mg Oral QHS  . spironolactone  12.5 mg Oral Daily  . torsemide  40 mg Oral Daily   Continuous Infusions: . methocarbamol (ROBAXIN) IV       LOS: 4 days   Bonnielee Haff, MD [facility] Pager On Amion  If 7PM-7AM, please contact night-coverage 03/28/2018, 10:15 AM

## 2018-03-28 NOTE — Care Management Note (Signed)
Case Management Note  Patient Details  Name: Kyle Conrad MRN: [id number] Date of Birth: 11-Apr-1966  Subjective/Objective:                    Action/Plan:  Patient does not qualify for [facility] [place], see previous NCM note from recent admission. Patient can get prescription filled at [facility]. Expected Discharge Date:  03/28/18               Expected Discharge Plan:  Home/Self Care  In-House Referral:     Discharge planning Services  CM Consult, [facility]  Post Acute Care Choice:  NA Choice offered to:     DME Arranged:  N/A DME Agency:  NA  HH Arranged:  NA HH Agency:  NA  Status of Service:  Completed, signed off  If discussed at [place] of Stay Meetings, dates discussed:    Additional Comments:  Marilu Favre, RN 03/28/2018, 11:18 AM

## 2018-03-28 NOTE — Plan of Care (Signed)
  Problem: Health Behavior/Discharge Planning: Goal: Ability to manage health-related needs will improve Outcome: Progressing   Problem: Clinical Measurements: Goal: Ability to maintain clinical measurements within normal limits will improve Outcome: Progressing   

## 2018-03-28 NOTE — Discharge Instructions (Signed)
[facility] _______[facility] ° °UMBILICAL OR INGUINAL HERNIA REPAIR: POST OP INSTRUCTIONS ° °Always review your discharge instruction sheet given to you by the facility where your surgery was performed. °IF YOU HAVE DISABILITY OR FAMILY LEAVE FORMS, YOU MUST BRING THEM TO THE OFFICE FOR PROCESSING.   °DO NOT GIVE THEM TO YOUR DOCTOR. ° °1. A  prescription for pain medication may be given to you upon discharge.  Take your pain medication as prescribed, if needed.  If narcotic pain medicine is not needed, then you may take acetaminophen (Tylenol) or ibuprofen (Advil) as needed. °2. Take your usually prescribed medications unless otherwise directed. °If you need a refill on your pain medication, please contact your pharmacy.  They will contact our office to request authorization. Prescriptions will not be filled after 5 pm or on week-ends. °3. You should follow a light diet the first 24 hours after arrival home, such as soup and crackers, etc.  Be sure to include lots of fluids daily.  Resume your normal diet the day after surgery. °4.Most patients will experience some swelling and bruising around the umbilicus or in the groin and scrotum.  Ice packs and reclining will help.  Swelling and bruising can take several days to resolve.  °6. It is common to experience some constipation if taking pain medication after surgery.  Increasing fluid intake and taking a stool softener (such as Colace) will usually help or prevent this problem from occurring.  A mild laxative (Milk of Magnesia or Miralax) should be taken according to package directions if there are no bowel movements after 48 hours. °7. Unless discharge instructions indicate otherwise, you may remove your bandages 24-48 hours after surgery, and you may shower at that time.  You may have steri-strips (small skin tapes) in place directly over the incision.  These strips should be left on the skin for 7-10 days.  If your surgeon used skin glue on the  incision, you may shower in 24 hours.  The glue will flake off over the next 2-3 weeks.  Any sutures or staples will be removed at the office during your follow-up visit. °8. ACTIVITIES:  You may resume regular (light) daily activities beginning the next day--such as daily self-care, walking, climbing stairs--gradually increasing activities as tolerated.  You may have sexual intercourse when it is comfortable.  Refrain from any heavy lifting or straining until approved by your doctor. ° °a.You may drive when you are no longer taking prescription pain medication, you can comfortably wear a seatbelt, and you can safely maneuver your car and apply brakes. °b.RETURN TO WORK:   °_____________________________________________ ° °9.You should see your doctor in the office for a follow-up appointment approximately 2-3 weeks after your surgery.  Make sure that you call for this appointment within a day or two after you arrive home to insure a convenient appointment time. °10.OTHER INSTRUCTIONS: _________________________ °   _____________________________________ ° °WHEN TO CALL YOUR DOCTOR: °1. Fever over 101.0 °2. Inability to urinate °3. Nausea and/or vomiting °4. Extreme swelling or bruising °5. Continued bleeding from incision. °6. Increased pain, redness, or drainage from the incision ° °The clinic staff is available to answer your questions during regular business hours.  Please don’t hesitate to call and ask to speak to one of the nurses for clinical concerns.  If you have a medical emergency, go to the nearest emergency room or call 911.  A surgeon from [facility] is always on call at the hospital ° ° °  [address], [address], [place], [place]  [address] ? ° [address] [address], [place], [place]   [address] °[phone] ? [phone] ? FAX [phone] °Web site: [email or web address] °

## 2018-03-28 NOTE — Progress Notes (Signed)
Kyle Conrad to be D/C'd  per MD order. Discussed with the patient and all questions fully answered.  VSS, Skin clean, dry and intact without evidence of skin break down, no evidence of skin tears noted.  IV catheter discontinued intact. Site without signs and symptoms of complications. Dressing and pressure applied.  An After Visit Summary was printed and given to the patient. Patient received prescription.  D/c education completed with patient/family including follow up instructions, medication list, d/c activities limitations if indicated, with other d/c instructions as indicated by MD - patient able to verbalize understanding, all questions fully answered.   Patient instructed to return to ED, call 911, or call MD for any changes in condition.   Patient to be escorted via [place], and D/C home via private auto.

## 2018-03-28 NOTE — Progress Notes (Signed)
4 Days Post-Op    CC:  Abdominal pain nausea and vomiting   Subjective: No bowel movement yesterday but otherwise doing well tolerating diet, ambulating and pain is fairly well controlled.  He is ready to go home from a surgical standpoint.  Objective: Vital signs in last 24 hours: Temp:  [98.3 F (36.8 C)-100 F (37.8 C)] 98.5 F (36.9 C) (11/13 0530) Pulse Rate:  [80-96] 89 (11/13 0530) Resp:  [16] 16 (11/13 0530) BP: (88-98)/(62-72) 94/66 (11/13 0530) SpO2:  [99 %-100 %] 100 % (11/13 0530) Last BM Date: 03/24/18 1110 PO 250 IV 450 urine recorded BP low 88/63, otherwise in the 95-63 systolic range. Na dow to 128, K+ 3.6, Mag 1.6, bilirubin is stable Telemetry shows SR. Pain:  Tylenol 1 gm x 3, ibuprofen 600 mg x 1 only pain meds yesterday Intake/Output from previous day: 11/12 0701 - 11/13 0700 In: 1360.1 [P.O.:1110; IV Piggyback:250.1] Out: 450 [Urine:450] Intake/Output this shift: No intake/output data recorded.  General appearance: alert, cooperative and no distress Resp: clear to auscultation bilaterally GI: soft, non-tender; bowel sounds normal; no masses,  no organomegaly and Left inguinal incision looks good healing nicely, remains sore but otherwise no issues with his incision.   Lab Results:  Recent Labs    03/26/18 0128 03/27/18 0413  WBC 7.2 7.6  HGB 11.3* 10.5*  HCT 34.3* 32.3*  PLT 362 375    BMET Recent Labs    03/27/18 0413 03/28/18 0142  NA 132* 128*  K 3.6 3.6  CL 97* 96*  CO2 29 26  GLUCOSE 93 84  BUN 15 14  CREATININE 0.97 0.83  CALCIUM 8.3* 7.9*   PT/INR No results for input(s): LABPROT, INR in the last 72 hours.  Recent Labs  Lab 03/24/18 1435 03/28/18 0142  AST 34 17  ALT 32 19  ALKPHOS 129* 98  BILITOT 3.8* 2.7*  PROT 8.1 6.2*  ALBUMIN 2.4* 1.9*     Lipase     Component Value Date/Time   LIPASE 34 03/24/2018 1435     Medications: . acetaminophen  1,000 mg Oral Q8H  . carvedilol  3.125 mg Oral BID WC  .  docusate sodium  100 mg Oral BID  . enoxaparin (LOVENOX) injection  40 mg Subcutaneous Q24H  . folic acid  1 mg Oral Daily  . hydrocerin   Topical BID  . pantoprazole  40 mg Oral QHS  . spironolactone  12.5 mg Oral Daily  . torsemide  40 mg Oral Daily    Assessment/Plan  Chronic systolic congestive heart failure(EF 20 to 25%, severe MR TR RVEF depression) Cirrhosis  - bilirubin stable 3.4 (03/19/18)  >>  2.7 (03/28/18) Hypertension  - BP in 90's Hyponatremia - 128 today  Incarcerated left inguinal hernia with small bowel obstruction REPAIR INCARCERATED LEFT INGUINAL HERNIA,INSERTION OF MESH, 03/24/18, Dr. Debara Pickett #4  FEN: IV fluids/regular diet ID: Cipro preop DVT: Lovenox Follow-up: Dr. Grandville Silos   Plan: From our standpoint he is ready to go home.  Sodium is down to 128.  BP is in the mid 90s.  Will await Medicines recommendations for discharge and home medications.  I have personally reviewed the patients medication history on the [place] controlled substance database.   Loralie Champagne, cardiology PCP:  Dorena Dew, FNP    LOS: 4 days    Earnstine Regal 03/28/2018 [phone]

## 2018-03-30 ENCOUNTER — Telehealth (HOSPITAL_COMMUNITY): Payer: Self-pay

## 2018-03-30 NOTE — Telephone Encounter (Signed)
I called Mr Tagle to schedule an initial appointment. He stated today would not be good so I suggested next Wednesday. He agreed to that day and we decided on 11:00.

## 2018-04-04 ENCOUNTER — Telehealth (HOSPITAL_COMMUNITY): Payer: Self-pay | Admitting: Licensed Clinical Social Worker

## 2018-04-04 ENCOUNTER — Other Ambulatory Visit (HOSPITAL_COMMUNITY): Payer: Self-pay

## 2018-04-04 ENCOUNTER — Telehealth (HOSPITAL_COMMUNITY): Payer: Self-pay

## 2018-04-04 NOTE — Progress Notes (Signed)
Paramedicine Encounter    Patient ID: Kyle Conrad, male    DOB: 1965-09-26, 52 y.o.   MRN: [id number]   Patient Care Team: Dorena Dew, FNP as PCP - General (Family Medicine)  Patient Active Problem List   Diagnosis Date Noted  . Incarcerated left inguinal hernia 03/24/2018  . Hyponatremia 03/24/2018  . Other cirrhosis of liver ([place]) 03/24/2018  . Indirect inguinal hernia   . SBO (small bowel obstruction) ([place])   . Pressure injury of skin 03/18/2018  . Acute hypoxemic respiratory failure ([place]) 03/11/2018  . Chest pain 03/11/2018  . Hyperbilirubinemia 03/11/2018  . Acute exacerbation of CHF (congestive heart failure) ([place]) 03/10/2018  . HTN (hypertension) 08/10/2015  . Hypoalbuminemia 05/19/2015  . Anasarca   . Acute congestive heart failure ([place]) 04/22/2015  . Chronic anemia 04/22/2015  . Obstructive sleep apnea 12/15/2008  . FATIGUE / MALAISE 10/23/2008  . DYSPNEA 10/23/2008  . Chronic systolic CHF (congestive heart failure) ([place]) 09/25/2008  . Overweight 09/18/2008  . VENTRICULAR TACHYCARDIA 09/18/2008    Current Outpatient Medications:  .  carvedilol (COREG) 3.125 MG tablet, Take 1 tablet (3.125 mg total) by mouth 2 (two) times daily with a meal., Disp: 60 tablet, Rfl: 0 .  docusate sodium (COLACE) 100 MG capsule, Take 1 capsule (100 mg total) by mouth 2 (two) times daily., Disp: 60 capsule, Rfl: 0 .  folic acid (FOLVITE) 1 MG tablet, Take 1 tablet (1 mg total) by mouth daily., Disp: 30 tablet, Rfl: 0 .  losartan (COZAAR) 25 MG tablet, Take 0.5 tablets (12.5 mg total) by mouth daily., Disp: 30 tablet, Rfl: 0 .  spironolactone (ALDACTONE) 25 MG tablet, Take 0.5 tablets (12.5 mg total) by mouth daily., Disp: 30 tablet, Rfl: 0 .  torsemide (DEMADEX) 20 MG tablet, Take 2 tablets (40 mg total) by mouth daily., Disp: 30 tablet, Rfl: 0 .  acetaminophen (TYLENOL) 500 MG tablet, You can take 1000 mg every 8 hours as needed for pain.  Can alternate this with ibuprofen, or  Tramadol.  Do not exceed 4000 mg of Tylenol(acetaminophen) per day it can harm your liver.  You can buy this over-the-counter at any drugstore. (Patient not taking: Reported on 04/04/2018), Disp: 30 tablet, Rfl: 0 .  ibuprofen (ADVIL,MOTRIN) 200 MG tablet, You can take 3 tablets every 6 hours as needed for pain.  You can alternate this with plain Tylenol or Tramadol.  You can buy this over-the-counter at any drugstore without a prescription. (Patient not taking: Reported on 04/04/2018), Disp: , Rfl:  .  Multiple Vitamin (MULTIVITAMIN WITH MINERALS) TABS tablet, Take 1 tablet by mouth daily. (Patient not taking: Reported on 04/04/2018), Disp: 30 tablet, Rfl: 0 .  polyethylene glycol (MIRALAX / GLYCOLAX) packet, You can use this for constipation as instructed on the package directions.  You can buy this at any drugstore without a prescription. (Patient not taking: Reported on 04/04/2018), Disp: 14 each, Rfl: 0 .  traMADol (ULTRAM) 50 MG tablet, Take 1 tablet (50 mg total) by mouth every 6 (six) hours as needed for moderate pain (Third line use if pain not relieved by PO Tylenol and Ibuprofen). (Patient not taking: Reported on 04/04/2018), Disp: 20 tablet, Rfl: 0 Allergies  Allergen Reactions  . Lisinopril Swelling    REACTION: pt had a swelling episode.  His lips swelled  . Penicillins Hives    Has patient had a PCN reaction causing immediate rash, facial/tongue/throat swelling, SOB or lightheadedness with hypotension: Yes Has patient had a PCN reaction causing  severe rash involving mucus membranes or skin necrosis: No Has patient had a PCN reaction that required hospitalization: No Has patient had a PCN reaction occurring within the last 10 years: No If all of the above answers are "NO", then may proceed with Cephalosporin use.      Social History   Socioeconomic History  . Marital status: Single    Spouse name: Not on file  . Number of children: Not on file  . Years of education: Not on file   . Highest education level: Not on file  Occupational History  . Not on file  Social Needs  . Financial resource strain: Not on file  . Food insecurity:    Worry: Not on file    Inability: Not on file  . Transportation needs:    Medical: Not on file    Non-medical: Not on file  Tobacco Use  . Smoking status: Never Smoker  . Smokeless tobacco: Never Used  Substance and Sexual Activity  . Alcohol use: No  . Drug use: No  . Sexual activity: Not on file  Lifestyle  . Physical activity:    Days per week: Not on file    Minutes per session: Not on file  . Stress: Not on file  Relationships  . Social connections:    Talks on phone: Not on file    Gets together: Not on file    Attends religious service: Not on file    Active member of club or organization: Not on file    Attends meetings of clubs or organizations: Not on file    Relationship status: Not on file  . Intimate partner violence:    Fear of current or ex partner: Not on file    Emotionally abused: Not on file    Physically abused: Not on file    Forced sexual activity: Not on file  Other Topics Concern  . Not on file  Social History Narrative  . Not on file    Physical Exam  Constitutional: He is oriented to person, place, and time.  Neck: No JVD present.  Cardiovascular: Normal rate and regular rhythm.  Pulmonary/Chest: Effort normal and breath sounds normal. No respiratory distress.  Abdominal: Soft.  Musculoskeletal: Normal range of motion. He exhibits no edema.  Neurological: He is alert and oriented to person, place, and time.  Skin: Skin is warm and dry.  Psychiatric: He has a normal mood and affect.     Community Paramedic Living Environment - 04/04/18 1100      Outside of House   Sidewalk and pathway to house is level and free from any hazards  Yes    Driveway is free from debris/snow/ice  Yes    Outside stairs are stable and have sturdy handrail  Yes    Porch lights are working and provide  adequate lighting  Yes      Living Room   Furniture is of adequate height and offers arm rests that assist in getting up and down  Yes    Floor is free from any clutter that would create tripping hazards  No    All cords are either behind furniture or secured in a manner that does not cause trip hazards  No    All rugs are secured to floor with double-sided tape  N/A    Lighting is adequate to light room  Yes    All lighting has an easily accessible on/off switch  Yes    Phone is  readily accessible near favorite seating areas  Yes    Emergency numbers are printed near all phones in house  No      Kitchen   Items used most often are within easy reach on low shelves  Yes    Step stool is present, is sturdy and has a handrail  Yes    Floor mats are non-slip tread and secured to floor  N/A    Oven controls are within easy reach  Yes    Kitchen lighting is adequate and easy to reach switches  Yes      Stairs   Carpet is properly secured to stairs and/or all wood is properly secured  N/A    Handrail is present and sturdy  N/A    Stairs are free from any clutter  N/A    Stairway is adequately lit  N/A      Bathroom   Tub and shower have a non-slip surface  Yes    Tub and/or shower have a grab bar for stability  No    Toilet has a raised seat  No    Grab bar is attached near toilet for assistance  No    Pathway from bedroom to bathroom is free from clutter and well lit for ease of movement in the middle of the night  No      Bedroom   Floor is free from clutter  No    Light is near bed and is easy to turn on  Yes    Phone is next to bed and within easy reach  Yes    Flashlight is near bed in case of emergency  No      General   Smoke detectors in all areas of the house (each floor) and tested  Yes    CO detectors on each floor of house and tested  Yes    Flashlights are handy throughout the home  No    Resident has all medical information readily available and in an area emergency  providers will easily find  No    All heaters are away from any type of flammable material  No      Overall Tips   Homeowner ha good non-skid shoes to move around house  Yes    All assisted walking devices are readily accessible and in good condition  No    There is a phone near the floor for ease of reach in case of a fall  YES    All O2 tubing is less than 50 ft. and is not a trip hazard  N/A    Resident has had an annual hearing and vision check by a physician  No    Resident has the proper hearing and visual aids prescribed and are in good working order  N/A    All medications are properly stored and labeled to avoid confusion on dosage, time to take, and avoidance of missed doses  Yes        No future appointments.  BP (!) 64/0 (BP Location: Left Arm, Patient Position: Sitting, Cuff Size: Normal)   Resp 16   Wt 143 lb 12.8 oz (65.2 kg)   BMI 19.50 kg/m   Weight yesterday- n/a Last visit weight- n/a  Mr Parco was seen at home for the first time today. He stated today he has been feeling dizzy and his vision has been blurry. This is the first time he has felt unwell and believes it is because  he took his medications without eating. I contacted the clinic and spoke with Oda Kilts, PA-C. He advised to have Mr Fangman hold torsemide for 3 days, hold losartan, carvedilol and spironolactone until tomorrow evening. I relayed this information to Mr Breighner and provided/filled a pillbox for him. Additionally he was scheduled for an appointment at the clinic next week. I will follow up tomorrow to see how he is feeling and recheck his blood pressure. He was understanding and agreeable.    Jacquiline Doe, EMT 04/04/18  ACTION: Home visit completed Next visit planned for 1 week

## 2018-04-04 NOTE — Telephone Encounter (Signed)
Kyle Conrad is with Zack at pt's home and the pt's systolic bp is 64. Was unable to get a dystolic. Pt is currently taking coreg 3.125 (Take 1 tablet (3.125 mg total) by mouth 2 (two) times daily with a meal) and losartan 25 mg (Take 0.5 tablets (12.5 mg total) by mouth daily). Pulse is 80. Please advise.

## 2018-04-04 NOTE — Telephone Encounter (Signed)
CSW accompanied community paramedic for pt initial home visit.   Pt reports he has had a hard time since being discharged from the hospital- states he was in immense pain and had to be readmitted after a few days for a hernia repair.  Since DC from hernia repair pt has been somewhat weak and feels a little unsteady- attributes this to him being sick, being in bed, and losing lots of weight recently (some of which was fluid).  CSW inquired about pt current insurance status- pt reports he still plans to purchase a plan through the marketplace- is able to verbalize website he needs to go through.  Pt currently sleeping on the couch- states this is because he hasn't been feeling well and that the couch is just more comfortable- also states that he had gotten sick in his bed and had not had the strength to do laundry at this time.  CSW discussed getting assistance from his friend Dewaine Oats to assist with laundry and going to the grocery store to get more food (pt states hes been ordering out a lot since he got home)- pt reported he would follow up.  Pt reports no concerns at this time- CSW will continue to follow in clinic and assist as able  Jorge Ny, [place] Worker [facility] [phone]

## 2018-04-05 ENCOUNTER — Other Ambulatory Visit (HOSPITAL_COMMUNITY): Payer: Self-pay

## 2018-04-05 NOTE — Progress Notes (Signed)
Kyle Conrad was seen at home today as a follow up to yesterday. He reported feeling significantly better than yesterday, denying dizziness or blurred vision. His blood pressure had recovered well after holding his medications last night. He was instructed to stick to the schedule of reintroducing his medications as they are in his pillbox and he was agreeable. While I was present, his information folder was completed and reviewed. He was instructed to leave it where it is noticeable in the event of him needing to call 911 so that responding crews would be familiar with his condition.   BP 100/78 (BP Location: Left Arm, Patient Position: Sitting, Cuff Size: Large)   Pulse 78   Resp 16   Wt 146 lb (66.2 kg)   SpO2 95%   BMI 19.80 kg/m

## 2018-04-05 NOTE — Discharge Summary (Signed)
Physician Discharge Summary  Patient ID: Kyle Conrad MRN: [id number] DOB/AGE: [birth year] 54 y.o.  Admit date: 03/24/2018 Discharge date: 03/28/2018  Admission Diagnoses:  Incarcerated left inguinal hernia with small bowel obstruction Chronic systolic congestive heart failure(EF 20 to 25%, severe MR TR RVEF depression) Cirrhosis   Hypertension    Discharge Diagnoses:  Same  Principal Problem:   Incarcerated left inguinal hernia Active Problems:   Chronic systolic CHF (congestive heart failure) ([facility])   HTN (hypertension)   Hyponatremia   Other cirrhosis of liver ([facility])   Indirect inguinal hernia   SBO (small bowel obstruction) ([place])   PROCEDURES: REPAIR INCARCERATED LEFT INGUINAL HERNIA,INSERTION OF MESH, 03/24/18, Dr. Mallie Darting Course: 52yo male just D/C from the hospital 11/5 after admit for CHF exacerbation.  He reports a one year history of a mass in his left groin extending down into his scrotum that comes and goes over time.  It has been out recently.  He developed nausea and vomiting.  He came to the emergency department and was evaluated.  He was found to have incarcerated left inguinal hernia containing small bowel causing a small bowel obstruction.  I was asked to see him for surgical care.  He denies previous history of hernia repair.  He was unaware of what this process has been over the past year with the mass in his groin. Patient was seen in the emergency department.by Dr. Georganna Skeans and taken to the operating room later that day.  He underwent procedure above.  He had a postop ileus slowly improved.  He was followed by the medicine service for his multiple medical issues.  His medications were adjusted by the medicine service and he was ready for discharge on 03/28/2018.  Discharge medicines were adjusted by Dr. [name] and he was discharged home.  Follow-up as listed below.  CBC Latest Ref Rng & Units 03/27/2018 03/26/2018 03/24/2018  WBC 4.0 -  10.5 K/uL 7.6 7.2 6.5  Hemoglobin 13.0 - 17.0 g/dL 10.5(L) 11.3(L) 13.5  Hematocrit 39.0 - 52.0 % 32.3(L) 34.3(L) 42.7  Platelets 150 - 400 K/uL 375 362 410(H)   CMP Latest Ref Rng & Units 03/28/2018 03/27/2018 03/26/2018  Glucose 70 - 99 mg/dL 84 93 91  BUN 6 - 20 mg/dL 14 15 18   Creatinine 0.61 - 1.24 mg/dL 0.83 0.97 1.04  Sodium 135 - 145 mmol/L 128(L) 132(L) 134(L)  Potassium 3.5 - 5.1 mmol/L 3.6 3.6 3.7  Chloride 98 - 111 mmol/L 96(L) 97(L) 98  CO2 22 - 32 mmol/L 26 29 31   Calcium 8.9 - 10.3 mg/dL 7.9(L) 8.3(L) 8.7(L)  Total Protein 6.5 - 8.1 g/dL 6.2(L) - -  Total Bilirubin 0.3 - 1.2 mg/dL 2.7(H) - -  Alkaline Phos 38 - 126 U/L 98 - -  AST 15 - 41 U/L 17 - -  ALT 0 - 44 U/L 19 - -   Condition on discharge: Improved Disposition: Home   Allergies as of 03/28/2018      Reactions   Lisinopril Swelling   REACTION: pt had a swelling episode.  His lips swelled   Penicillins Hives   Has patient had a PCN reaction causing immediate rash, facial/tongue/throat swelling, SOB or lightheadedness with hypotension: Yes Has patient had a PCN reaction causing severe rash involving mucus membranes or skin necrosis: No Has patient had a PCN reaction that required hospitalization: No Has patient had a PCN reaction occurring within the last 10 years: No If all of the above answers are "NO",  then may proceed with Cephalosporin use.      Medication List    TAKE these medications   acetaminophen 500 MG tablet Commonly known as:  TYLENOL You can take 1000 mg every 8 hours as needed for pain.  Can alternate this with ibuprofen, or Tramadol.  Do not exceed 4000 mg of Tylenol(acetaminophen) per day it can harm your liver.  You can buy this over-the-counter at any drugstore.   carvedilol 3.125 MG tablet Commonly known as:  COREG Take 1 tablet (3.125 mg total) by mouth 2 (two) times daily with a meal.   docusate sodium 100 MG capsule Commonly known as:  COLACE Take 1 capsule (100 mg total) by  mouth 2 (two) times daily.   folic acid 1 MG tablet Commonly known as:  FOLVITE Take 1 tablet (1 mg total) by mouth daily.   ibuprofen 200 MG tablet Commonly known as:  ADVIL,MOTRIN You can take 3 tablets every 6 hours as needed for pain.  You can alternate this with plain Tylenol or Tramadol.  You can buy this over-the-counter at any drugstore without a prescription.   losartan 25 MG tablet Commonly known as:  COZAAR Take 0.5 tablets (12.5 mg total) by mouth daily.   multivitamin with minerals Tabs tablet Take 1 tablet by mouth daily.   polyethylene glycol packet Commonly known as:  MIRALAX / GLYCOLAX You can use this for constipation as instructed on the package directions.  You can buy this at any drugstore without a prescription. What changed:    how much to take  how to take this  when to take this  reasons to take this  additional instructions   spironolactone 25 MG tablet Commonly known as:  ALDACTONE Take 0.5 tablets (12.5 mg total) by mouth daily.   torsemide 20 MG tablet Commonly known as:  DEMADEX Take 2 tablets (40 mg total) by mouth daily.   traMADol 50 MG tablet Commonly known as:  ULTRAM Take 1 tablet (50 mg total) by mouth every 6 (six) hours as needed for moderate pain (Third line use if pain not relieved by PO Tylenol and Ibuprofen).      Follow-up Information    Georganna Skeans, MD Follow up on 04/27/2018.   Specialty:  General Surgery Why:  Your appointment is at 2:40 PM.  Be at the office 30 minutes early for check in.  Bring photo ID and insruace information. Contact information: [address] [place] [address] [phone]        Dorena Dew, FNP Follow up.   Specialty:  Family Medicine Why:  Call for follow up for Blood pressure, and other medical issues. Contact information: [address] [place] [address] [phone]        Larey Dresser, MD Follow up.   Specialty:  Cardiology Why:   call for follow up appointment for your heart issues. Contact information: [address] [place] [address] [phone]           Signed: Earnstine Regal 04/05/2018, 2:01 PM

## 2018-04-09 ENCOUNTER — Telehealth (HOSPITAL_COMMUNITY): Payer: Self-pay

## 2018-04-09 NOTE — Progress Notes (Signed)
Advanced Heart Failure Clinic Note  Patient ID: Kyle Conrad, male   DOB: [birth year], 52 y.o.   MRN: [id number] PCP: None Cardiology: Dr. Aundra Dubin  52 y.o. with history of nonischemic cardiomyopathy presents for cardiology followup. He was initially diagnosed with CHF in 2010.  He was admitted then and treated for CHF exacerbation, EF was low.  However, on repeat echo in 2010, EF was back to normal and he was lost to followup.  He was admitted in 12/16 with acute systolic CHF.  Dyspnea had gradually built up and he had anasarca.  Echo showed EF 10-15% with diffuse hypokinesis, moderate RV dysfunction, and moderate to severe MR.  Cardiac output was low and he was started on milrinone gtt and IV Lasix.  He was diuresed extensively and had coronary angiography showing no CAD.  He was weaned off milrinone and sent home.  During this stay, he was also noted to have iron deficiency anemia and FOBT+.  Colonscopy showed diverticulosis and EGD showed a nonbleeding duodenal ulcer.  He had hypoalbuminemia, elevated INR, and ascites but imaging of the liver was not suggestive of cirrhosis.   Admitted 66/0630 with A/C systolic HF. He had been off medications for 2 years. HF team consulted. Diuresed 72 lbs with lasix drip and then transitioned to torsemide 40 mg daily. HF medications optimized. Hydral/imdur held with soft BP. He was referred to HF paramedicine. DC weight: 163 lbs.   Admitted 11/9-11/13/19 with incarcerated left inguinal hernia, causing small bowel obstruction. Had surgery on 11/9. No changes to HF medications.  He presents today for post hospital follow up. Overall doing well. Last week, he was hypotensive during paramedicine visit. Torsemide held x 3 days and BP meds held for one dose. He is dizzy after taking medications, worse with standing. Denies SOB, but not very active. Getting around house with no SOB, but gets fatigued. Okay with inclines. Energy level slowly improving. Denies edema,  orthopnea, or PND. Rare dry cough. No fever or chills. He has not been weighing because he needs new batteries in his scale, but he plans to get today. Limits salt. Gets [employer] veggie sub or [employer] soup delivery. Has been drinking >2 L fluid daily. Taking all medications. Followed by HF paramedicine. Medicaid application is pending.  Labs (12/16): K 4.2, creatinine 0.93, HCT 28.6, albumin 2, LFTs normal, SPEP negative, UPEP negative.   ECG: Sinus at 102 with poor R wave progression and low voltage, narrow QRS  PMH: 1. H/o angioedema with ACEI 2. Hypoalbuminemia 3. CHF: Nonischemic cardiomyopathy.  1st found in 2010.  Treated for cardiomyopathy and followup echo in 2010 showed recovery in EF to normal.  Admitted with acute systolic CHF in 16/01.  Echo (12/16) with EF 10-15%, severe LV dilation, moderate-severe MR, RV moderately dilated with moderately decreased systolic function.  LHC/RHC (12/16) with mean RA 6, PA 42/21 mean 33, mean PCWP 14, CI 5.1 Fick/3.6 thermo, PVR 2.84.  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  SPEP/UPEP negative.  4. Anemia: Fe-deficiency.  FOBT+.  Colonoscopy 12/16 with diverticulosis.  EGD with small, nonbleeding duodenal ulcer.   5. HTN 6. ?Cirrhosis: Elevated INR with ascites and hypoalbuminemia at 12/16 admission.  CT abdomen showed normal liver contour.   7. R>L arm edema with normal upper extremity venous dopplers in 12/16.   SH: Lives alone in [place].  No ETOH or smoking.    FH: No premature CAD, no CHF that he knows of.   Review of systems complete  and found to be negative unless listed in HPI.   Current Outpatient Medications  Medication Sig Dispense Refill  . acetaminophen (TYLENOL) 500 MG tablet You can take 1000 mg every 8 hours as needed for pain.  Can alternate this with ibuprofen, or Tramadol.  Do not exceed 4000 mg of Tylenol(acetaminophen) per day it can harm your liver.  You can buy this over-the-counter at any  drugstore. (Patient not taking: Reported on 04/04/2018) 30 tablet 0  . carvedilol (COREG) 3.125 MG tablet Take 1 tablet (3.125 mg total) by mouth 2 (two) times daily with a meal. 60 tablet 0  . docusate sodium (COLACE) 100 MG capsule Take 1 capsule (100 mg total) by mouth 2 (two) times daily. 60 capsule 0  . folic acid (FOLVITE) 1 MG tablet Take 1 tablet (1 mg total) by mouth daily. 30 tablet 0  . ibuprofen (ADVIL,MOTRIN) 200 MG tablet You can take 3 tablets every 6 hours as needed for pain.  You can alternate this with plain Tylenol or Tramadol.  You can buy this over-the-counter at any drugstore without a prescription. (Patient not taking: Reported on 04/04/2018)    . losartan (COZAAR) 25 MG tablet Take 0.5 tablets (12.5 mg total) by mouth daily. 30 tablet 0  . Multiple Vitamin (MULTIVITAMIN WITH MINERALS) TABS tablet Take 1 tablet by mouth daily. (Patient not taking: Reported on 04/04/2018) 30 tablet 0  . polyethylene glycol (MIRALAX / GLYCOLAX) packet You can use this for constipation as instructed on the package directions.  You can buy this at any drugstore without a prescription. (Patient not taking: Reported on 04/04/2018) 14 each 0  . spironolactone (ALDACTONE) 25 MG tablet Take 0.5 tablets (12.5 mg total) by mouth daily. 30 tablet 0  . torsemide (DEMADEX) 20 MG tablet Take 2 tablets (40 mg total) by mouth daily. 30 tablet 0  . traMADol (ULTRAM) 50 MG tablet Take 1 tablet (50 mg total) by mouth every 6 (six) hours as needed for moderate pain (Third line use if pain not relieved by PO Tylenol and Ibuprofen). (Patient not taking: Reported on 04/04/2018) 20 tablet 0   No current facility-administered medications for this encounter.    BP 104/72   Pulse 87   Wt 73 kg (161 lb)   SpO2 99%   BMI 21.84 kg/m    Wt Readings from Last 3 Encounters:  04/10/18 73 kg (161 lb)  04/05/18 66.2 kg (146 lb)  04/04/18 65.2 kg (143 lb 12.8 oz)   Orthostatics: Sitting: 104/72 Standing:  90/70  General: Well appearing. No resp difficulty. HEENT: Normal Neck: Supple. JVP 5-6. Carotids 2+ bilat; no bruits. No thyromegaly or nodule noted. Cor: PMI nondisplaced. RRR, No M/G/R noted Lungs: CTAB, normal effort. Abdomen: Soft, non-tender, non-distended, no HSM. No bruits or masses. +BS  Extremities: No cyanosis, clubbing, or rash. R and LLE no edema.  Neuro: Alert & orientedx3, cranial nerves grossly intact. moves all 4 extremities w/o difficulty. Affect pleasant   Assessment/Plan: 1. Chronic systolic CHF: NICM. Echo 03/11/18: EF 20-25% with severe LV dilation, severe RV dilation and dysfunction. - NYHA III, difficult to assess due to inactivity - Volume status stable to dry. Having low BPs and dizziness at home. Mildly orthostatic today - Hold torsemide x 2 days, then decrease to 20 mg daily. Can take an additional 20 mg PRN. Discussed when to take. - Continue coreg 3.125 mg twice a day.  - Continue spironolactone 12.5 mg daily.  - Change losartan 12.5 mg to qHS.  -  Will not add hydralazine/imdur with soft BP.  - No ACEi or ARNI with angioedema with ACEi - Continue HF paramedicine. - Schedule echo in 3-4 months with Dr Aundra Dubin. Discussed that if EF <35%, we would recommend referral for ICD. QRS narrow, not a CRT candidate. - Would like to refer for cardiac rehab, but currently has no insurance. Medicaid application pending. Declines to speak with CSW today (met with her last week during home visit).  2. Abdominal Pain: HIDDA scan 03/12/18 with findings compatible with gallbladder dysfunction, no evidence of cystic duct or common bile duct obstruction.   - S/p inguinal hernia repair 11/9. No abdominal pain today. 3. Cirrhosis: Liver with cirrhotic appearance on abdominal US. 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 Had moderate ascites. No change 4. Hyponatremia - BMET today.  5. Anasarca: Albumin was low at 1.9. 24 hour urine protein completed to assess for nephrotic syndrome =>  Urine protein < reportable range/24 hrs (not nephrotic syndrome). Anasarca resolved with diuresis. No change.  6. Noncompliance: Improving with paramedicine. Medicaid is pending. Meds through HF fund for now.  7. Hypomagnesium - Mag 1.6 on 11/13. Recheck today.   BMET, mag today Told torsemide x 2 days, then decrease to 20 mg daily. Can take extra 20 mg PRN Declines to see CSW today Follow up 4-6 weeks Schedule echo with Dr Aundra Dubin in 3-4 months  Georgiana Shore 04/10/2018  Greater than 50% of the 25 minute visit was spent in counseling/coordination of care regarding disease state education, salt/fluid restriction, sliding scale diuretics, and medication compliance.

## 2018-04-09 NOTE — Telephone Encounter (Signed)
I called Kyle Conrad to schedule an appointment. He didn't answer so I left a message requesting he call me back.

## 2018-04-10 ENCOUNTER — Ambulatory Visit (HOSPITAL_COMMUNITY)
Admission: RE | Admit: 2018-04-10 | Discharge: 2018-04-10 | Disposition: A | Discharge status: Home / Self Care | Payer: Self-pay | Source: Ambulatory Visit | Attending: Internal Medicine | Admitting: Internal Medicine

## 2018-04-10 ENCOUNTER — Other Ambulatory Visit (HOSPITAL_COMMUNITY): Payer: Self-pay

## 2018-04-10 ENCOUNTER — Encounter (HOSPITAL_COMMUNITY): Payer: Self-pay

## 2018-04-10 VITALS — BP 104/72 | HR 87 | Wt 161.0 lb

## 2018-04-10 DIAGNOSIS — R109 Unspecified abdominal pain: Secondary | ICD-10-CM | POA: Insufficient documentation

## 2018-04-10 DIAGNOSIS — Z79899 Other long term (current) drug therapy: Secondary | ICD-10-CM | POA: Insufficient documentation

## 2018-04-10 DIAGNOSIS — I110 Hypertensive heart disease with heart failure: Secondary | ICD-10-CM | POA: Insufficient documentation

## 2018-04-10 DIAGNOSIS — Z9119 Patient's noncompliance with other medical treatment and regimen: Secondary | ICD-10-CM | POA: Insufficient documentation

## 2018-04-10 DIAGNOSIS — R188 Other ascites: Secondary | ICD-10-CM | POA: Insufficient documentation

## 2018-04-10 DIAGNOSIS — E871 Hypo-osmolality and hyponatremia: Secondary | ICD-10-CM | POA: Insufficient documentation

## 2018-04-10 DIAGNOSIS — I428 Other cardiomyopathies: Secondary | ICD-10-CM | POA: Insufficient documentation

## 2018-04-10 DIAGNOSIS — Z91199 Patient's noncompliance with other medical treatment and regimen due to unspecified reason: Secondary | ICD-10-CM

## 2018-04-10 DIAGNOSIS — K7460 Unspecified cirrhosis of liver: Secondary | ICD-10-CM | POA: Insufficient documentation

## 2018-04-10 DIAGNOSIS — I5022 Chronic systolic (congestive) heart failure: Secondary | ICD-10-CM | POA: Insufficient documentation

## 2018-04-10 LAB — BASIC METABOLIC PANEL
ANION GAP: 7 (ref 5–15)
BUN: 17 mg/dL (ref 6–20)
CO2: 26 mmol/L (ref 22–32)
Calcium: 8.5 mg/dL — ABNORMAL LOW (ref 8.9–10.3)
Chloride: 104 mmol/L (ref 98–111)
Creatinine, Ser: 0.97 mg/dL (ref 0.61–1.24)
GFR calc Af Amer: >60 mL/min (ref >60)
GFR calc non Af Amer: >60 mL/min (ref >60)
GLUCOSE: 87 mg/dL (ref 70–99)
POTASSIUM: 4.1 mmol/L (ref 3.5–5.1)
Sodium: 137 mmol/L (ref 135–145)

## 2018-04-10 LAB — MAGNESIUM: MAGNESIUM: 1.6 mg/dL — AB (ref 1.7–2.4)

## 2018-04-10 MED ORDER — TORSEMIDE 20 MG PO TABS: ORAL_TABLET | ORAL | 3 refills | Status: DC

## 2018-04-10 MED ORDER — LOSARTAN POTASSIUM 25 MG PO TABS: 12.5000 mg | ORAL_TABLET | Freq: Every day | ORAL | 3 refills | Status: DC

## 2018-04-10 MED FILL — LOSARTAN POTASSIUM 25 MG TA: 25 | 30 days supply | Qty: 15 | Fill #0

## 2018-04-10 MED FILL — TORSEMIDE 20 MG TABLET: 20 | 30 days supply | Qty: 60 | Fill #0

## 2018-04-10 NOTE — Progress Notes (Signed)
Paramedicine Encounter   Patient ID: Kyle Conrad , male,   DOB: 05/01/1966,52 y.o.,  MRN: [id number]   Kyle Conrad was seen at the HF clinic with Lillia Mountain, NP. Per Caryl Pina, he is to hold torsemide for two days due to positive orthostatic changes and move spironolactone and losartan to evening.   Jacquiline Doe, EMT 04/10/2018   ACTION: Next visit planned for this afternoon for medication reconciliation

## 2018-04-10 NOTE — Patient Instructions (Signed)
HOLD Torsemide for 2 days then DECREASE Torsemide to 20 mg, one tab daily you may take an additional tab in the PM as needed for weight gain or swelling  CHANGE Losartan to 12.5 mg, one half tab daily at bedtime  Labs today We will only contact you if something comes back abnormal or we need to make some changes. Otherwise no news is good news!  Your physician recommends that you schedule a follow-up appointment in: 4-6 weeks  in the Advanced Practitioners (PA/NP) Clinic   Your physician recommends that you schedule a follow-up appointment in: 3-4 months with Dr Aundra Dubin and echo  Your physician has requested that you have an echocardiogram. Echocardiography is a painless test that uses sound waves to create images of your heart. It provides your doctor with information about the size and shape of your heart and how well your heart's chambers and valves are working. This procedure takes approximately one hour. There are no restrictions for this procedure.   Do the following things EVERYDAY: 1) Weigh yourself in the morning before breakfast. Write it down and keep it in a log. 2) Take your medicines as prescribed 3) Eat low salt foods-Limit salt (sodium) to 2000 mg per day.  4) Stay as active as you can everyday 5) Limit all fluids for the day to less than 2 liters

## 2018-04-10 NOTE — Progress Notes (Signed)
Mr Kinzler was seen at home following his HF clinic appointment to make medication adjustments. Nothing further was necessary at this appointment.

## 2018-04-11 ENCOUNTER — Telehealth (HOSPITAL_COMMUNITY): Payer: Self-pay | Admitting: *Deleted

## 2018-04-11 MED ORDER — MAGNESIUM OXIDE 400 MG PO TABS: 400.0000 mg | ORAL_TABLET | Freq: Every day | ORAL | 3 refills | Status: DC

## 2018-04-11 MED FILL — MAGNESIUM OXIDE 400 MG TAB: 400 | 30 days supply | Qty: 30 | Fill #0

## 2018-04-11 NOTE — Telephone Encounter (Signed)
-----   Message from Georgiana Shore, NP sent at 04/10/2018  1:26 PM EST ----- Renal function and potassium stable. Magnesium is low. Please have him start taking max ox 400 mg daily. Warn him that it can cause diarrhea. Thanks

## 2018-04-11 NOTE — Telephone Encounter (Signed)
Notes recorded by Harvie Junior, CMA on 04/11/2018 at 1:49 PM EST Spoke with Zack with paramedicine. Mag sent to pharmacy. ------  Notes recorded by Georgiana Shore, NP on 04/11/2018 at 8:17 AM EST Thanks. Can also let Zack know with paramedicine to get to him. ------  Notes recorded by Valeda Malm, RN on 04/10/2018 at 4:20 PM EST LM on patient voicemail to call office to discuss lab results. ------  Notes recorded by Georgiana Shore, NP on 04/10/2018 at 1:26 PM EST Renal function and potassium stable. Magnesium is low. Please have him start taking max ox 400 mg daily. Warn him that it can cause diarrhea. Thanks

## 2018-04-18 ENCOUNTER — Telehealth (HOSPITAL_COMMUNITY): Payer: Self-pay

## 2018-04-18 NOTE — Telephone Encounter (Signed)
I called Kyle Conrad to schedule an appointment. He did not answer so I left a voicemail requesting he call me back when he is able.

## 2018-04-24 ENCOUNTER — Telehealth (HOSPITAL_COMMUNITY): Payer: Self-pay

## 2018-04-24 NOTE — Telephone Encounter (Signed)
[name] returned my phone call and advised that he could meet at any time this week I suggested meeting on Thursday at 15:00 and he was agreeable.

## 2018-04-24 NOTE — Telephone Encounter (Signed)
I called [name] to schedule an appointment. He did not answer so I left a voicemail requesting he call me back.

## 2018-04-26 ENCOUNTER — Other Ambulatory Visit (HOSPITAL_COMMUNITY): Payer: Self-pay

## 2018-04-26 ENCOUNTER — Encounter (HOSPITAL_COMMUNITY): Payer: Self-pay

## 2018-04-26 NOTE — Progress Notes (Signed)
Paramedicine Encounter    Patient ID: Kyle Conrad, male    DOB: [birth year], 52 y.o.   MRN: [id number]    Patient Care Team: Dorena Dew, FNP as PCP - General (Family Medicine) Jorge Ny, LCSW as Social Worker (Licensed Clinical Social Worker)  Patient Active Problem List   Diagnosis Date Noted  . Incarcerated left inguinal hernia 03/24/2018  . Hyponatremia 03/24/2018  . Other cirrhosis of liver ([place]) 03/24/2018  . Indirect inguinal hernia   . SBO (small bowel obstruction) ([place])   . Pressure injury of skin 03/18/2018  . Acute hypoxemic respiratory failure ([place]) 03/11/2018  . Chest pain 03/11/2018  . Hyperbilirubinemia 03/11/2018  . Acute exacerbation of CHF (congestive heart failure) ([place]) 03/10/2018  . HTN (hypertension) 08/10/2015  . Hypoalbuminemia 05/19/2015  . Anasarca   . Acute congestive heart failure ([place]) 04/22/2015  . Chronic anemia 04/22/2015  . Obstructive sleep apnea 12/15/2008  . FATIGUE / MALAISE 10/23/2008  . DYSPNEA 10/23/2008  . Chronic systolic CHF (congestive heart failure) ([place]) 09/25/2008  . Overweight 09/18/2008  . VENTRICULAR TACHYCARDIA 09/18/2008    Current Outpatient Medications:  .  carvedilol (COREG) 3.125 MG tablet, Take 1 tablet (3.125 mg total) by mouth 2 (two) times daily with a meal., Disp: 60 tablet, Rfl: 0 .  docusate sodium (COLACE) 100 MG capsule, Take 1 capsule (100 mg total) by mouth 2 (two) times daily., Disp: 60 capsule, Rfl: 0 .  folic acid (FOLVITE) 1 MG tablet, Take 1 tablet (1 mg total) by mouth daily., Disp: 30 tablet, Rfl: 0 .  losartan (COZAAR) 25 MG tablet, Take 0.5 tablets (12.5 mg total) by mouth at bedtime., Disp: 30 tablet, Rfl: 3 .  magnesium oxide (MAG-OX) 400 MG tablet, Take 1 tablet (400 mg total) by mouth daily., Disp: 30 tablet, Rfl: 3 .  spironolactone (ALDACTONE) 25 MG tablet, Take 0.5 tablets (12.5 mg total) by mouth daily., Disp: 30 tablet, Rfl: 0 .  torsemide (DEMADEX) 20 MG tablet, Take 1 tablet  (20 mg total) by mouth daily. May also take 1 tablet (20 mg total) at bedtime as needed., Disp: 60 tablet, Rfl: 3 .  acetaminophen (TYLENOL) 500 MG tablet, You can take 1000 mg every 8 hours as needed for pain.  Can alternate this with ibuprofen, or Tramadol.  Do not exceed 4000 mg of Tylenol(acetaminophen) per day it can harm your liver.  You can buy this over-the-counter at any drugstore. (Patient not taking: Reported on 04/04/2018), Disp: 30 tablet, Rfl: 0 .  ibuprofen (ADVIL,MOTRIN) 200 MG tablet, You can take 3 tablets every 6 hours as needed for pain.  You can alternate this with plain Tylenol or Tramadol.  You can buy this over-the-counter at any drugstore without a prescription. (Patient not taking: Reported on 04/04/2018), Disp: , Rfl:  .  Multiple Vitamin (MULTIVITAMIN WITH MINERALS) TABS tablet, Take 1 tablet by mouth daily. (Patient not taking: Reported on 04/04/2018), Disp: 30 tablet, Rfl: 0 .  polyethylene glycol (MIRALAX / GLYCOLAX) packet, You can use this for constipation as instructed on the package directions.  You can buy this at any drugstore without a prescription. (Patient not taking: Reported on 04/04/2018), Disp: 14 each, Rfl: 0 .  traMADol (ULTRAM) 50 MG tablet, Take 1 tablet (50 mg total) by mouth every 6 (six) hours as needed for moderate pain (Third line use if pain not relieved by PO Tylenol and Ibuprofen). (Patient not taking: Reported on 04/04/2018), Disp: 20 tablet, Rfl: 0 Allergies  Allergen Reactions  .  Lisinopril Swelling    REACTION: pt had a swelling episode.  His lips swelled  . Penicillins Hives    Has patient had a PCN reaction causing immediate rash, facial/tongue/throat swelling, SOB or lightheadedness with hypotension: Yes Has patient had a PCN reaction causing severe rash involving mucus membranes or skin necrosis: No Has patient had a PCN reaction that required hospitalization: No Has patient had a PCN reaction occurring within the last 10 years: No If all  of the above answers are "NO", then may proceed with Cephalosporin use.     Social History   Socioeconomic History  . Marital status: Single    Spouse name: Not on file  . Number of children: Not on file  . Years of education: Not on file  . Highest education level: Not on file  Occupational History  . Not on file  Social Needs  . Financial resource strain: Not on file  . Food insecurity:    Worry: Not on file    Inability: Not on file  . Transportation needs:    Medical: Not on file    Non-medical: Not on file  Tobacco Use  . Smoking status: Never Smoker  . Smokeless tobacco: Never Used  Substance and Sexual Activity  . Alcohol use: No  . Drug use: No  . Sexual activity: Not on file  Lifestyle  . Physical activity:    Days per week: Not on file    Minutes per session: Not on file  . Stress: Not on file  Relationships  . Social connections:    Talks on phone: Not on file    Gets together: Not on file    Attends religious service: Not on file    Active member of club or organization: Not on file    Attends meetings of clubs or organizations: Not on file    Relationship status: Not on file  . Intimate partner violence:    Fear of current or ex partner: Not on file    Emotionally abused: Not on file    Physically abused: Not on file    Forced sexual activity: Not on file  Other Topics Concern  . Not on file  Social History Narrative  . Not on file    Physical Exam Constitutional:      Appearance: He is not diaphoretic.         Future Appointments  Date Time Provider [place]  05/14/2018 11:00 AM [facility] PA/NP [facility] None  07/12/2018 10:00 AM [facility] ECHO 1-BUZZ [facility]  07/12/2018 11:00 AM Larey Dresser, MD [facility] None     BP 102/60 (BP Location: Left Arm, Patient Position: Sitting, Cuff Size: Normal)   Pulse (!) 112   Resp 16   Wt 160 lb (72.6 kg)   SpO2 99%   BMI 21.70 kg/m   Weight yesterday-didn't weigh Last visit  weight-161 ( clinic visit 04/10/18)  ATF pt CAO x 4 c/o difficulty breathing last night, vomiting, abdominal pain and weakness.  He stated that last night was the first time in a "while" that he had trouble sleeping through the night.  Pt stated that he had vomit a couple of times, he has chronic GI issues but the color of the vomit was a different color. He took his meds for thi week including magnesium which he contributes to the N/V that he's experenicing today.  Pt denies sob, chest pain and dizziness at this time.  rx bottles verified and pill refilled.  Pt was advised to see his pcp or GI specialist tomorrow if his symptoms got worse or didn't get better, or to the ED tonight if needed.    Medication ordered: Spirolactone filled until fri..none in [place], EMT Paramedic [phone] 04/27/2018    ACTION: Home visit completed

## 2018-05-03 ENCOUNTER — Telehealth (HOSPITAL_COMMUNITY): Payer: Self-pay

## 2018-05-03 NOTE — Telephone Encounter (Signed)
I called Kyle Conrad to schedule an appointment. He did not answer so I left a voicemail requesting he call me back when he is able.

## 2018-05-07 ENCOUNTER — Telehealth (HOSPITAL_COMMUNITY): Payer: Self-pay

## 2018-05-07 NOTE — Telephone Encounter (Signed)
I called Kyle Conrad to schedule an appointment. He did not answer but returned my call via text message. He advised he was out of carvedilol and spironolactone and asked if I could have them refilled. He also said he has been suffering nausea and vomiting over the weekend and asked what he should do. I advised him to call his PCP and schedule and appointment for evaluation. He did not respond.

## 2018-05-14 ENCOUNTER — Encounter (HOSPITAL_COMMUNITY): Payer: Self-pay

## 2018-05-18 ENCOUNTER — Telehealth (HOSPITAL_COMMUNITY): Payer: Self-pay | Admitting: Adult Health

## 2018-05-18 NOTE — Telephone Encounter (Signed)
Called and left message for patient to call our office.  We need to reschedule [facility] Clinic appt for 05/29/2018 @1 :30 pm to next ava appt per Carole Binning, RN.

## 2018-05-21 NOTE — Telephone Encounter (Signed)
Called patient and left message to contact our office to reschedule 05/29/2018 appt as APP Clinic will be closed at 1:30 pm.

## 2018-05-22 NOTE — Telephone Encounter (Signed)
Left message on [facility] phone to have patient call us to reschedule appt for 05/29/2018 @1 :30 pm.

## 2018-05-23 ENCOUNTER — Telehealth (HOSPITAL_COMMUNITY): Payer: Self-pay | Admitting: Surgery

## 2018-05-23 NOTE — Telephone Encounter (Signed)
Patient will be discharged from [facility] program secondary to inability to make contact to schedule home visits.

## 2018-05-29 ENCOUNTER — Encounter (HOSPITAL_COMMUNITY): Payer: Self-pay

## 2018-06-05 ENCOUNTER — Encounter (HOSPITAL_COMMUNITY): Payer: Self-pay

## 2018-06-05 ENCOUNTER — Ambulatory Visit (HOSPITAL_COMMUNITY)
Admission: RE | Admit: 2018-06-05 | Discharge: 2018-06-05 | Disposition: A | Discharge status: Home / Self Care | Payer: Self-pay | Source: Ambulatory Visit | Attending: Cardiology | Admitting: Cardiology

## 2018-06-05 ENCOUNTER — Telehealth (HOSPITAL_COMMUNITY): Payer: Self-pay | Admitting: Licensed Clinical Social Worker

## 2018-06-05 VITALS — BP 144/102 | HR 106 | Wt 207.4 lb

## 2018-06-05 DIAGNOSIS — I5082 Biventricular heart failure: Secondary | ICD-10-CM | POA: Insufficient documentation

## 2018-06-05 DIAGNOSIS — I5022 Chronic systolic (congestive) heart failure: Secondary | ICD-10-CM | POA: Insufficient documentation

## 2018-06-05 DIAGNOSIS — R109 Unspecified abdominal pain: Secondary | ICD-10-CM | POA: Insufficient documentation

## 2018-06-05 DIAGNOSIS — I428 Other cardiomyopathies: Secondary | ICD-10-CM | POA: Insufficient documentation

## 2018-06-05 DIAGNOSIS — I5023 Acute on chronic systolic (congestive) heart failure: Secondary | ICD-10-CM

## 2018-06-05 DIAGNOSIS — R601 Generalized edema: Secondary | ICD-10-CM | POA: Insufficient documentation

## 2018-06-05 DIAGNOSIS — Z9114 Patient's other noncompliance with medication regimen: Secondary | ICD-10-CM | POA: Insufficient documentation

## 2018-06-05 DIAGNOSIS — Z79899 Other long term (current) drug therapy: Secondary | ICD-10-CM | POA: Insufficient documentation

## 2018-06-05 DIAGNOSIS — K7460 Unspecified cirrhosis of liver: Secondary | ICD-10-CM | POA: Insufficient documentation

## 2018-06-05 DIAGNOSIS — I110 Hypertensive heart disease with heart failure: Secondary | ICD-10-CM | POA: Insufficient documentation

## 2018-06-05 DIAGNOSIS — E871 Hypo-osmolality and hyponatremia: Secondary | ICD-10-CM | POA: Insufficient documentation

## 2018-06-05 DIAGNOSIS — Z9119 Patient's noncompliance with other medical treatment and regimen: Secondary | ICD-10-CM | POA: Insufficient documentation

## 2018-06-05 DIAGNOSIS — Z91199 Patient's noncompliance with other medical treatment and regimen due to unspecified reason: Secondary | ICD-10-CM

## 2018-06-05 LAB — CBC
HCT: 39 % (ref 39.0–52.0)
Hemoglobin: 11.7 g/dL — ABNORMAL LOW (ref 13.0–17.0)
MCH: 30.6 pg (ref 26.0–34.0)
MCHC: 30 g/dL (ref 30.0–36.0)
MCV: 102.1 fL — AB (ref 80.0–100.0)
NRBC: 0 % (ref 0.0–0.2)
Platelets: 372 10*3/uL (ref 150–400)
RBC: 3.82 MIL/uL — ABNORMAL LOW (ref 4.22–5.81)
RDW: 15.9 % — AB (ref 11.5–15.5)
WBC: 4.6 10*3/uL (ref 4.0–10.5)

## 2018-06-05 LAB — COMPREHENSIVE METABOLIC PANEL
ALT: 15 U/L (ref 0–44)
ANION GAP: 7 (ref 5–15)
AST: 27 U/L (ref 15–41)
Albumin: 2.8 g/dL — ABNORMAL LOW (ref 3.5–5.0)
Alkaline Phosphatase: 99 U/L (ref 38–126)
BILIRUBIN TOTAL: 2.1 mg/dL — AB (ref 0.3–1.2)
BUN: 14 mg/dL (ref 6–20)
CO2: 21 mmol/L — ABNORMAL LOW (ref 22–32)
Calcium: 8.3 mg/dL — ABNORMAL LOW (ref 8.9–10.3)
Chloride: 112 mmol/L — ABNORMAL HIGH (ref 98–111)
Creatinine, Ser: 1.14 mg/dL (ref 0.61–1.24)
Glucose, Bld: 112 mg/dL — ABNORMAL HIGH (ref 70–99)
Potassium: 3.9 mmol/L (ref 3.5–5.1)
Sodium: 140 mmol/L (ref 135–145)
TOTAL PROTEIN: 7.7 g/dL (ref 6.5–8.1)

## 2018-06-05 LAB — MAGNESIUM: MAGNESIUM: 1.8 mg/dL (ref 1.7–2.4)

## 2018-06-05 LAB — TSH: TSH: 1.872 u[IU]/mL (ref 0.350–4.500)

## 2018-06-05 LAB — BRAIN NATRIURETIC PEPTIDE: B Natriuretic Peptide: 3961.4 pg/mL — ABNORMAL HIGH (ref 0.0–100.0)

## 2018-06-05 MED ORDER — FUROSEMIDE 10 MG/ML IJ SOLN
80.0000 mg | Freq: Once | INTRAMUSCULAR | Status: AC
  Administered 2018-06-05: 80 mg via INTRAVENOUS
  Filled 2018-06-05: qty 8

## 2018-06-05 MED ORDER — METOLAZONE 2.5 MG PO TABS
2.5000 mg | ORAL_TABLET | Freq: Once | ORAL | Status: AC
  Administered 2018-06-05: 2.5 mg via ORAL
  Filled 2018-06-05: qty 1

## 2018-06-05 MED ORDER — METOLAZONE 2.5 MG PO TABS: 2.5000 mg | ORAL_TABLET | Freq: Every day | ORAL | 0 refills | Status: DC

## 2018-06-05 MED ORDER — POTASSIUM CHLORIDE ER 20 MEQ PO TBCR: 20.0000 meq | EXTENDED_RELEASE_TABLET | Freq: Two times a day (BID) | ORAL | 6 refills | Status: DC

## 2018-06-05 MED ORDER — POTASSIUM CHLORIDE CRYS ER 20 MEQ PO TBCR
40.0000 meq | EXTENDED_RELEASE_TABLET | Freq: Once | ORAL | Status: AC
  Administered 2018-06-05: 40 meq via ORAL
  Filled 2018-06-05: qty 2

## 2018-06-05 MED ORDER — TORSEMIDE 20 MG PO TABS: 40.0000 mg | ORAL_TABLET | Freq: Two times a day (BID) | ORAL | 3 refills | Status: DC

## 2018-06-05 NOTE — Patient Instructions (Addendum)
Labs done today. We will contact you for any abnormal labs.  Today in clinic you received: 40mg  potassium, 2.5mg  Metolazone and 40mg  IV Furosemide.  INCREASE Torsemide 40mg  (2 tabs) twice daily  FOR TWO DAYS ONLY take Metolazone 2.5mg  daily for two days only  START Potassium 65meq (1 tab) twice daily. FOR THE NEXT TWO DAYS ONLY take an additional 93meq (1 tab) adding up to 2 tabs twice daily (Wednesday and Thursday only).  If symptoms worsen (shortness of breath) or if you stop peeing GO TO THE EMERGENCY ROOM.  Follow up with the Advanced Practice Provider this Friday January 24th 2020.

## 2018-06-05 NOTE — Progress Notes (Signed)
Pt given 80mg  iv lasix in clinic and 2.5mg  of metolazone. Pt voided 866ml. Pt eager to leave. Given a urinal.

## 2018-06-05 NOTE — Telephone Encounter (Signed)
Entered in error

## 2018-06-05 NOTE — Progress Notes (Signed)
CSW consulted to speak with pt about re-entering the paramedicine program- pt in clinic and is 40lb volume overloaded- concerns about ability to manage medications on his own.  CSW met with pt and discussed current condition.  Pt states he has been gettings his heart failure medications and taking as prescribed.  Pt acknowledges being somewhat short of breath but attributes this to having a cold.    CSW discussed role of paramedicine and need for pt to remain in contact with them better to avoid his health getting out of hand.  Pt attributes poor communication to several health concerns at the time (reoccurance of his hernia) which made it hard to see paramedicine regularly- states he is hopeful he can meet with paramedic more regularly this time.  Pt reports no other concerns at this time just hopeful he can avoid admission to the hospital.  CSW will continue to follow and assist as needed  Jorge Ny, [place] Worker [facility] [phone]

## 2018-06-05 NOTE — Progress Notes (Signed)
Advanced Heart Failure Clinic Note  Patient ID: Kyle Conrad, male   DOB: 01/15/1966, 53 y.o.   MRN: [id number] PCP: None Cardiology: Dr. Aundra Dubin  53 y.o. with history of nonischemic cardiomyopathy presents for cardiology followup. He was initially diagnosed with CHF in 2010.  He was admitted then and treated for CHF exacerbation, EF was low.  However, on repeat echo in 2010, EF was back to normal and he was lost to followup.  He was admitted in 12/16 with acute systolic CHF.  Dyspnea had gradually built up and he had anasarca.  Echo showed EF 10-15% with diffuse hypokinesis, moderate RV dysfunction, and moderate to severe MR.  Cardiac output was low and he was started on milrinone gtt and IV Lasix.  He was diuresed extensively and had coronary angiography showing no CAD.  He was weaned off milrinone and sent home.  During this stay, he was also noted to have iron deficiency anemia and FOBT+.  Colonscopy showed diverticulosis and EGD showed a nonbleeding duodenal ulcer.  He had hypoalbuminemia, elevated INR, and ascites but imaging of the liver was not suggestive of cirrhosis.   Admitted 02/1750 with A/C systolic HF. He had been off medications for 2 years. HF team consulted. Diuresed 72 lbs with lasix drip and then transitioned to torsemide 40 mg daily. HF medications optimized. Hydral/imdur held with soft BP. He was referred to HF paramedicine. DC weight: 163 lbs.   Admitted 11/9-11/13/19 with incarcerated left inguinal hernia, causing small bowel obstruction. Had surgery on 11/9. No changes to HF medications.  He presents today for regular follow up. At last visit, torsemide decreased with orthostasis. Weight up 40 lbs since last visit. He states he has been out of his torsemide for "sometime" and has been out of his coreg for a few days. No further information given about why he stopped or didn't get refills. He denies SOB with exertion, orthopnea, or PND. He has peripheral edema up into his  thighs and testicles. Doesn't weigh at home.  He had several paramedicine visits, but stopped but never followed up and was discharged. He wants to avoid admission.   Labs (12/16): K 4.2, creatinine 0.93, HCT 28.6, albumin 2, LFTs normal, SPEP negative, UPEP negative.   ECG: Sinus at 102 with poor R wave progression and low voltage, narrow QRS  PMH: 1. H/o angioedema with ACEI 2. Hypoalbuminemia 3. CHF: Nonischemic cardiomyopathy.  1st found in 2010.  Treated for cardiomyopathy and followup echo in 2010 showed recovery in EF to normal.  Admitted with acute systolic CHF in 02/58.  Echo (12/16) with EF 10-15%, severe LV dilation, moderate-severe MR, RV moderately dilated with moderately decreased systolic function.  LHC/RHC (12/16) with mean RA 6, PA 42/21 mean 33, mean PCWP 14, CI 5.1 Fick/3.6 thermo, PVR 2.84.  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  SPEP/UPEP negative.  4. Anemia: Fe-deficiency.  FOBT+.  Colonoscopy 12/16 with diverticulosis.  EGD with small, nonbleeding duodenal ulcer.   5. HTN 6. ?Cirrhosis: Elevated INR with ascites and hypoalbuminemia at 12/16 admission.  CT abdomen showed normal liver contour.   7. R>L arm edema with normal upper extremity venous dopplers in 12/16.   SH: Lives alone in [place].  No ETOH or smoking.    FH: No premature CAD, no CHF that he knows of.   Review of systems complete and found to be negative unless listed in HPI.    Current Outpatient Medications  Medication Sig Dispense Refill  . acetaminophen (TYLENOL)  500 MG tablet You can take 1000 mg every 8 hours as needed for pain.  Can alternate this with ibuprofen, or Tramadol.  Do not exceed 4000 mg of Tylenol(acetaminophen) per day it can harm your liver.  You can buy this over-the-counter at any drugstore. 30 tablet 0  . docusate sodium (COLACE) 100 MG capsule Take 1 capsule (100 mg total) by mouth 2 (two) times daily. 60 capsule 0  . folic acid (FOLVITE) 1 MG tablet  Take 1 tablet (1 mg total) by mouth daily. 30 tablet 0  . ibuprofen (ADVIL,MOTRIN) 200 MG tablet You can take 3 tablets every 6 hours as needed for pain.  You can alternate this with plain Tylenol or Tramadol.  You can buy this over-the-counter at any drugstore without a prescription.    Marland Kitchen losartan (COZAAR) 25 MG tablet Take 0.5 tablets (12.5 mg total) by mouth at bedtime. 30 tablet 3  . magnesium oxide (MAG-OX) 400 MG tablet Take 1 tablet (400 mg total) by mouth daily. 30 tablet 3  . Multiple Vitamin (MULTIVITAMIN WITH MINERALS) TABS tablet Take 1 tablet by mouth daily. 30 tablet 0  . polyethylene glycol (MIRALAX / GLYCOLAX) packet You can use this for constipation as instructed on the package directions.  You can buy this at any drugstore without a prescription. 14 each 0  . torsemide (DEMADEX) 20 MG tablet Take 1 tablet (20 mg total) by mouth daily. May also take 1 tablet (20 mg total) at bedtime as needed. 60 tablet 3  . traMADol (ULTRAM) 50 MG tablet Take 1 tablet (50 mg total) by mouth every 6 (six) hours as needed for moderate pain (Third line use if pain not relieved by PO Tylenol and Ibuprofen). 20 tablet 0  . carvedilol (COREG) 3.125 MG tablet Take 1 tablet (3.125 mg total) by mouth 2 (two) times daily with a meal. (Patient not taking: Reported on 06/05/2018) 60 tablet 0   No current facility-administered medications for this encounter.    Vitals:   06/05/18 1341  BP: (!) 144/102  Pulse: (!) 106  SpO2: 98%  Weight: 94.1 kg (207 lb 6.4 oz)   Wt Readings from Last 3 Encounters:  06/05/18 94.1 kg (207 lb 6.4 oz)  04/26/18 72.6 kg (160 lb)  04/10/18 73 kg (161 lb)   General:Ill appearing. SOB with conversation.  HEENT: + scleral icterus.  Neck: Supple. JVP to ear. Carotids 2+ bilat; no bruits. No thyromegaly or nodule noted. Cor: PMI nondisplaced. Tachy. ? S3, but difficult to differentiate over his MR. + RV lift  Lungs: Diminished basilar sounds with basilar crackles.  Abdomen:  Distended, non-tender, No bruits or masses. +BS  Extremities: No cyanosis, clubbing, or rash. Cool to the touch, with 2-3+ chronic woody edema up into thighs.  Neuro: Alert & orientedx3, cranial nerves grossly intact. moves all 4 extremities w/o difficulty. Affect flat.  Assessment/Plan: 1. 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 Echo 03/11/18: EF 20-25% with severe LV dilation, severe RV dilation and dysfunction. - NYHA IIIb-IV symptoms - Volume status markedly elevated on exam.  He refuses admission - Will give 80 mg IV lasix and 2.5 mg metolazone in clinic.  - Restart torsemide at 40 mg BID with metolazone 2.5 mg x 2 days. Needs follow up later this week. BMET and K stable today.  - Start potassium 20 meq BID with extra 40 meq with 2 doses of metolazone.  - Stop coreg for now.  - Continue spironolactone 12.5 mg daily.  - Continue  losartan 12.5 mg to qHS.  - Will not add hydralazine/imdur with soft BP.  - No ACEi or ARNI with angioedema with ACEi - Continue HF paramedicine. - Plan Echo 07/12/18 with Dr Aundra Dubin. Discussed that if EF <35%, we would recommend referral for ICD. QRS narrow, not a CRT candidate. - Will ask SW to see today.  2. Abdominal Pain: HIDDA scan 03/12/18 with findings compatible with gallbladder dysfunction, no evidence of cystic duct or common bile duct obstruction.   - S/p inguinal hernia repair 11/9. Stable.  3. Cirrhosis: Liver with cirrhotic appearance on abdominal US. 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 Had moderate ascites.  - No change.  4. Hyponatremia - Na 140 today. Follow.  5. Anasarca:  - Albumin was low at 1.9 during previous admission. 24 hour urine protein completed to assess for nephrotic syndrome => Urine protein < reportable range/24 hrs (not nephrotic syndrome).  - He is markedly volume overloaded on exam. Refused admission.  6. Noncompliance:  - Will see if he is willing to pick paramedicine back up.  - Meds through HF fund.  7. Hypomagnesium -  Mag 1.8 today. Recheck Friday with diuresis. May need oral supp.   Markedly volume overloaded on exam. Refused admission. Given IV lasix in clinic with > 600 cc of clear UOP in ~30-40 minutes and wished to go home.  Follow up Friday am. Pt knows to come to ED if he feels worse or notices significant drop off in UOP.   Shirley Friar, PA-C 06/05/2018  Greater than 50% of the 50 minute visit was spent in counseling/coordination of care regarding disease state education, salt/fluid restriction, sliding scale diuretics, and medication compliance.

## 2018-06-06 MED FILL — TORSEMIDE 20 MG TABLET: 20 | 15 days supply | Qty: 60 | Fill #0

## 2018-06-06 MED FILL — metOLazone 2.5 MG TABS: 2.5 | 5 days supply | Qty: 5 | Fill #0

## 2018-06-06 MED FILL — POTASSIUM CL ER 20 MEQ TAB: 20 | 30 days supply | Qty: 60 | Fill #0

## 2018-06-08 ENCOUNTER — Encounter (HOSPITAL_COMMUNITY): Payer: Self-pay

## 2018-06-11 ENCOUNTER — Telehealth (HOSPITAL_COMMUNITY): Payer: Self-pay

## 2018-06-11 NOTE — Telephone Encounter (Signed)
I called Kyle Conrad to schedule an appointment. He did not answer his phone so I left a message with my information and requested he call me back when he is available.

## 2018-06-19 ENCOUNTER — Telehealth (HOSPITAL_COMMUNITY): Payer: Self-pay

## 2018-06-19 NOTE — Telephone Encounter (Signed)
I called Kyle Conrad to schedule an appointment. He did not answer so I left a message requesting the he call me back.

## 2018-07-12 ENCOUNTER — Ambulatory Visit (HOSPITAL_COMMUNITY): Payer: Self-pay

## 2018-07-12 ENCOUNTER — Encounter (HOSPITAL_COMMUNITY): Payer: Self-pay | Admitting: Cardiology

## 2018-07-25 ENCOUNTER — Telehealth (HOSPITAL_COMMUNITY): Payer: Self-pay | Admitting: Licensed Clinical Social Worker

## 2018-07-25 NOTE — Telephone Encounter (Signed)
Patient being discharged from paramedicine program for being nonresponsive.  CSW will continue to follow through clinic and assist as needed  Jorge Ny, LCSW Clinical Social Worker [facility] [phone]

## 2018-08-16 ENCOUNTER — Encounter (HOSPITAL_COMMUNITY): Payer: Self-pay

## 2018-08-16 ENCOUNTER — Inpatient Hospital Stay (HOSPITAL_COMMUNITY): Payer: Medicaid Other

## 2018-08-16 ENCOUNTER — Inpatient Hospital Stay (HOSPITAL_COMMUNITY)
Admission: EM | Admit: 2018-08-16 | Discharge: 2018-09-20 | DRG: 871 | Disposition: A | Discharge status: Skilled Nursing Facility | Payer: Medicaid Other | Attending: Cardiology | Admitting: Cardiology

## 2018-08-16 ENCOUNTER — Other Ambulatory Visit: Payer: Self-pay

## 2018-08-16 ENCOUNTER — Emergency Department (HOSPITAL_COMMUNITY): Payer: Medicaid Other

## 2018-08-16 DIAGNOSIS — A4159 Other Gram-negative sepsis: Secondary | ICD-10-CM | POA: Diagnosis present

## 2018-08-16 DIAGNOSIS — Z7189 Other specified counseling: Secondary | ICD-10-CM

## 2018-08-16 DIAGNOSIS — T783XXA Angioneurotic edema, initial encounter: Secondary | ICD-10-CM | POA: Diagnosis present

## 2018-08-16 DIAGNOSIS — S81801A Unspecified open wound, right lower leg, initial encounter: Secondary | ICD-10-CM | POA: Diagnosis present

## 2018-08-16 DIAGNOSIS — W19XXXA Unspecified fall, initial encounter: Secondary | ICD-10-CM | POA: Diagnosis present

## 2018-08-16 DIAGNOSIS — R64 Cachexia: Secondary | ICD-10-CM | POA: Diagnosis present

## 2018-08-16 DIAGNOSIS — I509 Heart failure, unspecified: Secondary | ICD-10-CM

## 2018-08-16 DIAGNOSIS — L89320 Pressure ulcer of left buttock, unstageable: Secondary | ICD-10-CM | POA: Diagnosis present

## 2018-08-16 DIAGNOSIS — I5082 Biventricular heart failure: Secondary | ICD-10-CM | POA: Diagnosis present

## 2018-08-16 DIAGNOSIS — E162 Hypoglycemia, unspecified: Secondary | ICD-10-CM | POA: Diagnosis present

## 2018-08-16 DIAGNOSIS — K5790 Diverticulosis of intestine, part unspecified, without perforation or abscess without bleeding: Secondary | ICD-10-CM | POA: Diagnosis present

## 2018-08-16 DIAGNOSIS — R0902 Hypoxemia: Secondary | ICD-10-CM

## 2018-08-16 DIAGNOSIS — E872 Acidosis: Secondary | ICD-10-CM | POA: Diagnosis present

## 2018-08-16 DIAGNOSIS — R188 Other ascites: Secondary | ICD-10-CM | POA: Diagnosis present

## 2018-08-16 DIAGNOSIS — I081 Rheumatic disorders of both mitral and tricuspid valves: Secondary | ICD-10-CM | POA: Diagnosis present

## 2018-08-16 DIAGNOSIS — I110 Hypertensive heart disease with heart failure: Secondary | ICD-10-CM | POA: Diagnosis present

## 2018-08-16 DIAGNOSIS — Z452 Encounter for adjustment and management of vascular access device: Secondary | ICD-10-CM

## 2018-08-16 DIAGNOSIS — K269 Duodenal ulcer, unspecified as acute or chronic, without hemorrhage or perforation: Secondary | ICD-10-CM | POA: Diagnosis present

## 2018-08-16 DIAGNOSIS — F22 Delusional disorders: Secondary | ICD-10-CM | POA: Diagnosis not present

## 2018-08-16 DIAGNOSIS — G9341 Metabolic encephalopathy: Secondary | ICD-10-CM | POA: Diagnosis present

## 2018-08-16 DIAGNOSIS — D509 Iron deficiency anemia, unspecified: Secondary | ICD-10-CM | POA: Diagnosis present

## 2018-08-16 DIAGNOSIS — I96 Gangrene, not elsewhere classified: Secondary | ICD-10-CM | POA: Diagnosis present

## 2018-08-16 DIAGNOSIS — I272 Pulmonary hypertension, unspecified: Secondary | ICD-10-CM | POA: Diagnosis present

## 2018-08-16 DIAGNOSIS — E43 Unspecified severe protein-calorie malnutrition: Secondary | ICD-10-CM | POA: Diagnosis not present

## 2018-08-16 DIAGNOSIS — I428 Other cardiomyopathies: Secondary | ICD-10-CM | POA: Diagnosis present

## 2018-08-16 DIAGNOSIS — J9601 Acute respiratory failure with hypoxia: Secondary | ICD-10-CM | POA: Diagnosis present

## 2018-08-16 DIAGNOSIS — I878 Other specified disorders of veins: Secondary | ICD-10-CM | POA: Diagnosis present

## 2018-08-16 DIAGNOSIS — L089 Local infection of the skin and subcutaneous tissue, unspecified: Secondary | ICD-10-CM | POA: Diagnosis present

## 2018-08-16 DIAGNOSIS — Z1159 Encounter for screening for other viral diseases: Secondary | ICD-10-CM | POA: Diagnosis not present

## 2018-08-16 DIAGNOSIS — E878 Other disorders of electrolyte and fluid balance, not elsewhere classified: Secondary | ICD-10-CM | POA: Diagnosis not present

## 2018-08-16 DIAGNOSIS — R570 Cardiogenic shock: Secondary | ICD-10-CM | POA: Diagnosis present

## 2018-08-16 DIAGNOSIS — L97121 Non-pressure chronic ulcer of left thigh limited to breakdown of skin: Secondary | ICD-10-CM | POA: Diagnosis present

## 2018-08-16 DIAGNOSIS — R000 Tachycardia, unspecified: Secondary | ICD-10-CM | POA: Diagnosis not present

## 2018-08-16 DIAGNOSIS — J189 Pneumonia, unspecified organism: Secondary | ICD-10-CM

## 2018-08-16 DIAGNOSIS — L97221 Non-pressure chronic ulcer of left calf limited to breakdown of skin: Secondary | ICD-10-CM | POA: Diagnosis present

## 2018-08-16 DIAGNOSIS — I5043 Acute on chronic combined systolic (congestive) and diastolic (congestive) heart failure: Secondary | ICD-10-CM | POA: Diagnosis present

## 2018-08-16 DIAGNOSIS — I959 Hypotension, unspecified: Secondary | ICD-10-CM | POA: Diagnosis present

## 2018-08-16 DIAGNOSIS — N170 Acute kidney failure with tubular necrosis: Secondary | ICD-10-CM | POA: Diagnosis not present

## 2018-08-16 DIAGNOSIS — D65 Disseminated intravascular coagulation [defibrination syndrome]: Secondary | ICD-10-CM | POA: Diagnosis present

## 2018-08-16 DIAGNOSIS — Z681 Body mass index (BMI) 19 or less, adult: Secondary | ICD-10-CM | POA: Diagnosis not present

## 2018-08-16 DIAGNOSIS — E876 Hypokalemia: Secondary | ICD-10-CM | POA: Diagnosis not present

## 2018-08-16 DIAGNOSIS — R6521 Severe sepsis with septic shock: Secondary | ICD-10-CM | POA: Diagnosis present

## 2018-08-16 DIAGNOSIS — B9689 Other specified bacterial agents as the cause of diseases classified elsewhere: Secondary | ICD-10-CM | POA: Diagnosis present

## 2018-08-16 DIAGNOSIS — T68XXXA Hypothermia, initial encounter: Secondary | ICD-10-CM | POA: Diagnosis present

## 2018-08-16 DIAGNOSIS — Z79899 Other long term (current) drug therapy: Secondary | ICD-10-CM

## 2018-08-16 DIAGNOSIS — R233 Spontaneous ecchymoses: Secondary | ICD-10-CM | POA: Diagnosis present

## 2018-08-16 DIAGNOSIS — K4030 Unilateral inguinal hernia, with obstruction, without gangrene, not specified as recurrent: Secondary | ICD-10-CM | POA: Diagnosis present

## 2018-08-16 DIAGNOSIS — N179 Acute kidney failure, unspecified: Secondary | ICD-10-CM

## 2018-08-16 DIAGNOSIS — G9340 Encephalopathy, unspecified: Secondary | ICD-10-CM

## 2018-08-16 DIAGNOSIS — E875 Hyperkalemia: Secondary | ICD-10-CM | POA: Diagnosis not present

## 2018-08-16 DIAGNOSIS — R579 Shock, unspecified: Secondary | ICD-10-CM

## 2018-08-16 DIAGNOSIS — T464X5A Adverse effect of angiotensin-converting-enzyme inhibitors, initial encounter: Secondary | ICD-10-CM | POA: Diagnosis not present

## 2018-08-16 DIAGNOSIS — L97521 Non-pressure chronic ulcer of other part of left foot limited to breakdown of skin: Secondary | ICD-10-CM | POA: Diagnosis present

## 2018-08-16 DIAGNOSIS — Z515 Encounter for palliative care: Secondary | ICD-10-CM | POA: Diagnosis present

## 2018-08-16 DIAGNOSIS — Z801 Family history of malignant neoplasm of trachea, bronchus and lung: Secondary | ICD-10-CM

## 2018-08-16 DIAGNOSIS — Z880 Allergy status to penicillin: Secondary | ICD-10-CM

## 2018-08-16 DIAGNOSIS — R079 Chest pain, unspecified: Secondary | ICD-10-CM | POA: Diagnosis not present

## 2018-08-16 DIAGNOSIS — E039 Hypothyroidism, unspecified: Secondary | ICD-10-CM | POA: Diagnosis present

## 2018-08-16 DIAGNOSIS — I313 Pericardial effusion (noninflammatory): Secondary | ICD-10-CM | POA: Diagnosis present

## 2018-08-16 DIAGNOSIS — Z888 Allergy status to other drugs, medicaments and biological substances status: Secondary | ICD-10-CM

## 2018-08-16 DIAGNOSIS — E440 Moderate protein-calorie malnutrition: Secondary | ICD-10-CM

## 2018-08-16 DIAGNOSIS — Z9119 Patient's noncompliance with other medical treatment and regimen: Secondary | ICD-10-CM

## 2018-08-16 DIAGNOSIS — L89150 Pressure ulcer of sacral region, unstageable: Secondary | ICD-10-CM | POA: Diagnosis present

## 2018-08-16 DIAGNOSIS — S81802A Unspecified open wound, left lower leg, initial encounter: Secondary | ICD-10-CM | POA: Diagnosis present

## 2018-08-16 DIAGNOSIS — R491 Aphonia: Secondary | ICD-10-CM | POA: Diagnosis not present

## 2018-08-16 LAB — GLUCOSE, CAPILLARY
Glucose-Capillary: 33 mg/dL — CL (ref 70–99)
Glucose-Capillary: 35 mg/dL — CL (ref 70–99)
Glucose-Capillary: 36 mg/dL — CL (ref 70–99)
Glucose-Capillary: 45 mg/dL — ABNORMAL LOW (ref 70–99)
Glucose-Capillary: 47 mg/dL — ABNORMAL LOW (ref 70–99)
Glucose-Capillary: 57 mg/dL — ABNORMAL LOW (ref 70–99)
Glucose-Capillary: 60 mg/dL — ABNORMAL LOW (ref 70–99)
Glucose-Capillary: 73 mg/dL (ref 70–99)
Glucose-Capillary: 98 mg/dL (ref 70–99)

## 2018-08-16 LAB — CBC WITH DIFFERENTIAL/PLATELET
Abs Immature Granulocytes: 0.48 10*3/uL — ABNORMAL HIGH (ref 0.00–0.07)
Abs Immature Granulocytes: 0 10*3/uL (ref 0.00–0.07)
Basophils Absolute: 0.1 10*3/uL (ref 0.0–0.1)
Basophils Absolute: 0 10*3/uL (ref 0.0–0.1)
Basophils Relative: 1 %
Basophils Relative: 0 %
Eosinophils Absolute: 1.8 10*3/uL — ABNORMAL HIGH (ref 0.0–0.5)
Eosinophils Absolute: 0 10*3/uL (ref 0.0–0.5)
Eosinophils Relative: 41 %
Eosinophils Relative: 0 %
HCT: 40.6 % (ref 39.0–52.0)
HCT: 34 % — ABNORMAL LOW (ref 39.0–52.0)
Hemoglobin: 12.5 g/dL — ABNORMAL LOW (ref 13.0–17.0)
Hemoglobin: 10.9 g/dL — ABNORMAL LOW (ref 13.0–17.0)
Immature Granulocytes: 11 %
Lymphocytes Relative: 2 %
Lymphocytes Relative: 4 %
Lymphs Abs: 0.1 10*3/uL — ABNORMAL LOW (ref 0.7–4.0)
Lymphs Abs: 0.1 10*3/uL — ABNORMAL LOW (ref 0.7–4.0)
MCH: 28.3 pg (ref 26.0–34.0)
MCH: 29.2 pg (ref 26.0–34.0)
MCHC: 30.8 g/dL (ref 30.0–36.0)
MCHC: 32.1 g/dL (ref 30.0–36.0)
MCV: 92.1 fL (ref 80.0–100.0)
MCV: 91.2 fL (ref 80.0–100.0)
Monocytes Absolute: 0 10*3/uL — ABNORMAL LOW (ref 0.1–1.0)
Monocytes Absolute: 0 10*3/uL — ABNORMAL LOW (ref 0.1–1.0)
Monocytes Relative: 1 %
Monocytes Relative: 0 %
Myelocytes: 1 %
Neutro Abs: 1.9 10*3/uL (ref 1.7–7.7)
Neutro Abs: 2.1 10*3/uL (ref 1.7–7.7)
Neutrophils Relative %: 44 %
Neutrophils Relative %: 94 %
Platelets: 53 10*3/uL — ABNORMAL LOW (ref 150–400)
Platelets: 45 10*3/uL — ABNORMAL LOW (ref 150–400)
Promyelocytes Relative: 1 %
RBC: 4.41 MIL/uL (ref 4.22–5.81)
RBC: 3.73 MIL/uL — ABNORMAL LOW (ref 4.22–5.81)
RDW: 17.7 % — ABNORMAL HIGH (ref 11.5–15.5)
RDW: 17.8 % — ABNORMAL HIGH (ref 11.5–15.5)
WBC: 4.3 10*3/uL (ref 4.0–10.5)
WBC: 2.2 10*3/uL — ABNORMAL LOW (ref 4.0–10.5)
nRBC: 0.5 % — ABNORMAL HIGH (ref 0.0–0.2)
nRBC: 0.9 % — ABNORMAL HIGH (ref 0.0–0.2)
nRBC: 4 /100 WBC — ABNORMAL HIGH

## 2018-08-16 LAB — COMPREHENSIVE METABOLIC PANEL
ALT: 23 U/L (ref 0–44)
ALT: 21 U/L (ref 0–44)
AST: 48 U/L — ABNORMAL HIGH (ref 15–41)
AST: 54 U/L — ABNORMAL HIGH (ref 15–41)
Albumin: 1.9 g/dL — ABNORMAL LOW (ref 3.5–5.0)
Albumin: 1.7 g/dL — ABNORMAL LOW (ref 3.5–5.0)
Alkaline Phosphatase: 114 U/L (ref 38–126)
Alkaline Phosphatase: 99 U/L (ref 38–126)
Anion gap: 10 (ref 5–15)
Anion gap: 9 (ref 5–15)
BUN: 45 mg/dL — ABNORMAL HIGH (ref 6–20)
BUN: 46 mg/dL — ABNORMAL HIGH (ref 6–20)
CO2: 20 mmol/L — ABNORMAL LOW (ref 22–32)
CO2: 18 mmol/L — ABNORMAL LOW (ref 22–32)
Calcium: 8.5 mg/dL — ABNORMAL LOW (ref 8.9–10.3)
Calcium: 8.2 mg/dL — ABNORMAL LOW (ref 8.9–10.3)
Chloride: 113 mmol/L — ABNORMAL HIGH (ref 98–111)
Chloride: 114 mmol/L — ABNORMAL HIGH (ref 98–111)
Creatinine, Ser: 1.9 mg/dL — ABNORMAL HIGH (ref 0.61–1.24)
Creatinine, Ser: 1.83 mg/dL — ABNORMAL HIGH (ref 0.61–1.24)
GFR calc Af Amer: 45 mL/min — ABNORMAL LOW (ref >60)
GFR calc Af Amer: 47 mL/min — ABNORMAL LOW (ref >60)
GFR calc non Af Amer: 39 mL/min — ABNORMAL LOW (ref >60)
GFR calc non Af Amer: 41 mL/min — ABNORMAL LOW (ref >60)
Glucose, Bld: 44 mg/dL — CL (ref 70–99)
Glucose, Bld: 84 mg/dL (ref 70–99)
Potassium: 4.2 mmol/L (ref 3.5–5.1)
Potassium: 4 mmol/L (ref 3.5–5.1)
Sodium: 143 mmol/L (ref 135–145)
Sodium: 141 mmol/L (ref 135–145)
Total Bilirubin: 3.7 mg/dL — ABNORMAL HIGH (ref 0.3–1.2)
Total Bilirubin: 3.7 mg/dL — ABNORMAL HIGH (ref 0.3–1.2)
Total Protein: 6 g/dL — ABNORMAL LOW (ref 6.5–8.1)
Total Protein: 5.2 g/dL — ABNORMAL LOW (ref 6.5–8.1)

## 2018-08-16 LAB — LACTIC ACID, PLASMA
Lactic Acid, Venous: 3.1 mmol/L (ref 0.5–1.9)
Lactic Acid, Venous: 4.3 mmol/L (ref 0.5–1.9)
Lactic Acid, Venous: 4.9 mmol/L (ref 0.5–1.9)

## 2018-08-16 LAB — PHOSPHORUS: Phosphorus: 3.9 mg/dL (ref 2.5–4.6)

## 2018-08-16 LAB — CBG MONITORING, ED
Glucose-Capillary: 67 mg/dL — ABNORMAL LOW (ref 70–99)
Glucose-Capillary: 30 mg/dL — CL (ref 70–99)
Glucose-Capillary: 88 mg/dL (ref 70–99)
Glucose-Capillary: 63 mg/dL — ABNORMAL LOW (ref 70–99)

## 2018-08-16 LAB — CORTISOL: Cortisol, Plasma: 28.6 ug/dL

## 2018-08-16 LAB — PROCALCITONIN: Procalcitonin: 6.76 ng/mL

## 2018-08-16 LAB — BRAIN NATRIURETIC PEPTIDE: B Natriuretic Peptide: 2433.3 pg/mL — ABNORMAL HIGH (ref 0.0–100.0)

## 2018-08-16 LAB — TROPONIN I: Troponin I: <0.03 ng/mL (ref <0.03)

## 2018-08-16 LAB — APTT: aPTT: 59 seconds — ABNORMAL HIGH (ref 24–36)

## 2018-08-16 LAB — COOXEMETRY PANEL
Carboxyhemoglobin: 0.9 % (ref 0.5–1.5)
Methemoglobin: 1.1 % (ref 0.0–1.5)
O2 Saturation: 64 %
Total hemoglobin: 11.7 g/dL — ABNORMAL LOW (ref 12.0–16.0)

## 2018-08-16 LAB — CK: Total CK: 194 U/L (ref 49–397)

## 2018-08-16 LAB — T4, FREE: Free T4: 1.09 ng/dL (ref 0.82–1.77)

## 2018-08-16 LAB — D-DIMER, QUANTITATIVE: D-Dimer, Quant: 3.83 ug/mL-FEU — ABNORMAL HIGH (ref 0.00–0.50)

## 2018-08-16 LAB — TSH: TSH: 5.578 u[IU]/mL — ABNORMAL HIGH (ref 0.350–4.500)

## 2018-08-16 LAB — PROTIME-INR
INR: 4.3 (ref 0.8–1.2)
Prothrombin Time: 40.5 seconds — ABNORMAL HIGH (ref 11.4–15.2)

## 2018-08-16 LAB — MAGNESIUM: Magnesium: 1.7 mg/dL (ref 1.7–2.4)

## 2018-08-16 LAB — AMMONIA: Ammonia: 20 umol/L (ref 9–35)

## 2018-08-16 LAB — AMYLASE: Amylase: 28 U/L (ref 28–100)

## 2018-08-16 LAB — MRSA PCR SCREENING: MRSA by PCR: NEGATIVE

## 2018-08-16 MED ORDER — NOREPINEPHRINE 4 MG/250ML-% IV SOLN
2.0000 ug/min | INTRAVENOUS | Status: DC
  Administered 2018-08-16: 2 ug/min via INTRAVENOUS
  Filled 2018-08-16: qty 250

## 2018-08-16 MED ORDER — SODIUM CHLORIDE 0.9 % IV SOLN
250.0000 mL | INTRAVENOUS | Status: DC
  Administered 2018-08-16 – 2018-08-28 (×2): 250 mL via INTRAVENOUS

## 2018-08-16 MED ORDER — HEPARIN SODIUM (PORCINE) 5000 UNIT/ML IJ SOLN
5000.0000 [IU] | Freq: Three times a day (TID) | INTRAMUSCULAR | Status: DC
  Administered 2018-08-16 – 2018-08-17 (×4): 5000 [IU] via SUBCUTANEOUS
  Filled 2018-08-16 (×4): qty 1

## 2018-08-16 MED ORDER — DEXTROSE 50 % IV SOLN
1.0000 | Freq: Once | INTRAVENOUS | Status: AC
  Administered 2018-08-16 (×2): 50 mL via INTRAVENOUS
  Filled 2018-08-16: qty 50

## 2018-08-16 MED ORDER — HYDROCORTISONE NA SUCCINATE PF 100 MG IJ SOLR
50.0000 mg | Freq: Four times a day (QID) | INTRAMUSCULAR | Status: DC
  Administered 2018-08-16 – 2018-08-19 (×12): 50 mg via INTRAVENOUS
  Filled 2018-08-16 (×11): qty 2

## 2018-08-16 MED ORDER — DEXTROSE 50 % IV SOLN
25.0000 mL | Freq: Once | INTRAVENOUS | Status: AC
  Administered 2018-08-16: 25 mL via INTRAVENOUS
  Filled 2018-08-16: qty 50

## 2018-08-16 MED ORDER — FUROSEMIDE 10 MG/ML IJ SOLN
120.0000 mg | Freq: Two times a day (BID) | INTRAVENOUS | Status: DC
  Filled 2018-08-16 (×4): qty 12

## 2018-08-16 MED ORDER — SODIUM CHLORIDE 0.9 % IV BOLUS
500.0000 mL | Freq: Once | INTRAVENOUS | Status: AC
  Administered 2018-08-16: 500 mL via INTRAVENOUS

## 2018-08-16 MED ORDER — DEXTROSE 50 % IV SOLN
INTRAVENOUS | Status: AC
  Administered 2018-08-16: 16:00:00 50 mL via INTRAVENOUS
  Filled 2018-08-16: qty 50

## 2018-08-16 MED ORDER — DEXTROSE-NACL 5-0.45 % IV SOLN
INTRAVENOUS | Status: DC
  Administered 2018-08-16: 14:00:00 via INTRAVENOUS

## 2018-08-16 MED ORDER — DEXTROSE 50 % IV SOLN
INTRAVENOUS | Status: AC
  Filled 2018-08-16: qty 50

## 2018-08-16 MED ORDER — VANCOMYCIN HCL 10 G IV SOLR: 1250.0000 mg | Freq: Once | INTRAVENOUS | Status: DC

## 2018-08-16 MED ORDER — VANCOMYCIN HCL IN DEXTROSE 1-5 GM/200ML-% IV SOLN: 1000.0000 mg | Freq: Once | INTRAVENOUS | Status: DC

## 2018-08-16 MED ORDER — MAGNESIUM SULFATE 2 GM/50ML IV SOLN
2.0000 g | Freq: Once | INTRAVENOUS | Status: AC
  Administered 2018-08-16: 2 g via INTRAVENOUS
  Filled 2018-08-16: qty 50

## 2018-08-16 MED ORDER — GLUCAGON HCL RDNA (DIAGNOSTIC) 1 MG IJ SOLR
1.0000 mg | Freq: Once | INTRAMUSCULAR | Status: AC
  Administered 2018-08-16: 21:00:00 1 mg via INTRAVENOUS
  Filled 2018-08-16: qty 1

## 2018-08-16 MED ORDER — NOREPINEPHRINE 16 MG/250ML-% IV SOLN
0.0000 ug/min | INTRAVENOUS | Status: DC
  Administered 2018-08-16: 12 ug/min via INTRAVENOUS
  Administered 2018-08-17: 17 ug/min via INTRAVENOUS
  Administered 2018-08-18: 11 ug/min via INTRAVENOUS
  Administered 2018-08-20: 4 ug/min via INTRAVENOUS
  Filled 2018-08-16 (×4): qty 250

## 2018-08-16 MED ORDER — DEXTROSE 10 % IV SOLN
INTRAVENOUS | Status: DC
  Administered 2018-08-16: 16:00:00 via INTRAVENOUS

## 2018-08-16 MED ORDER — DEXTROSE 50 % IV SOLN
25.0000 mL | Freq: Once | INTRAVENOUS | Status: AC
  Administered 2018-08-16: 17:00:00 25 mL via INTRAVENOUS

## 2018-08-16 MED ORDER — VANCOMYCIN HCL 10 G IV SOLR
1250.0000 mg | INTRAVENOUS | Status: DC
  Filled 2018-08-16: qty 1250

## 2018-08-16 MED ORDER — GLUCAGON HCL RDNA (DIAGNOSTIC) 1 MG IJ SOLR: 1.0000 mg | Freq: Once | INTRAMUSCULAR | Status: DC | PRN

## 2018-08-16 MED ORDER — ONDANSETRON HCL 4 MG/2ML IJ SOLN
4.0000 mg | Freq: Four times a day (QID) | INTRAMUSCULAR | Status: DC | PRN
  Administered 2018-09-06 – 2018-09-12 (×2): 4 mg via INTRAVENOUS
  Filled 2018-08-16 (×2): qty 2

## 2018-08-16 MED ORDER — DEXTROSE 20 % IV SOLN
INTRAVENOUS | Status: DC
  Administered 2018-08-16: 22:00:00 via INTRAVENOUS
  Filled 2018-08-16 (×2): qty 500

## 2018-08-16 MED ORDER — DEXTROSE 50 % IV SOLN
1.0000 | Freq: Once | INTRAVENOUS | Status: AC
  Administered 2018-08-16: 50 mL via INTRAVENOUS
  Filled 2018-08-16: qty 50

## 2018-08-16 MED ORDER — LEVOFLOXACIN IN D5W 750 MG/150ML IV SOLN: 750.0000 mg | INTRAVENOUS | Status: DC

## 2018-08-16 MED ORDER — VANCOMYCIN HCL 10 G IV SOLR
2000.0000 mg | Freq: Once | INTRAVENOUS | Status: AC
  Administered 2018-08-16: 2000 mg via INTRAVENOUS
  Filled 2018-08-16: qty 2000

## 2018-08-16 MED ORDER — LEVOFLOXACIN IN D5W 750 MG/150ML IV SOLN
750.0000 mg | Freq: Once | INTRAVENOUS | Status: AC
  Administered 2018-08-16: 750 mg via INTRAVENOUS
  Filled 2018-08-16: qty 150

## 2018-08-16 MED ORDER — FAMOTIDINE 20 MG IN NS 100 ML IVPB
20.0000 mg | Freq: Two times a day (BID) | INTRAVENOUS | Status: DC
  Administered 2018-08-16 – 2018-08-17 (×3): 20 mg via INTRAVENOUS
  Filled 2018-08-16 (×5): qty 100

## 2018-08-16 NOTE — Progress Notes (Signed)
Pharmacy Antibiotic Note  Kyle Conrad is a 53 y.o. male admitted on 08/16/2018 with sepsis.  Pharmacy has been consulted for Vancomycin and Levaquin dosing. SCr 1.9 (BL<1), CrCl ~ 57 ml/min  Plan: Vancomycin 2g IV x1, then 1250mg  IV every 24 hours (nomogram dosing d/t AKI) Levaquin 750mg  IV every 24 hours Monitor renal function, Cx and clinical progression to narrow Vancomycin levels at steady state  Weight: 246 lb 14.6 oz (112 kg)  Temp (24hrs), Avg:86.9 F (30.5 C), Min:86.9 F (30.5 C), Max:86.9 F (30.5 C)  Recent Labs  Lab 08/16/18 1333  WBC 4.3  LATICACIDVEN 3.1*    CrCl cannot be calculated (Patient's most recent lab result is older than the maximum 21 days allowed.).    Allergies  Allergen Reactions  . Lisinopril Swelling    REACTION: pt had a swelling episode.  His lips swelled  . Penicillins Hives    Has patient had a PCN reaction causing immediate rash, facial/tongue/throat swelling, SOB or lightheadedness with hypotension: Yes Has patient had a PCN reaction causing severe rash involving mucus membranes or skin necrosis: No Has patient had a PCN reaction that required hospitalization: No Has patient had a PCN reaction occurring within the last 10 years: No If all of the above answers are "NO", then may proceed with Cephalosporin use.    Antimicrobials this admission: Vanc 4/2>> Levaquin 4/2>>  Dose adjustments this admission: n/a  Microbiology results: 4/2 BCx: sent 4/2 UCx: sent 4/2 Lurline Idol aspirate: sent  Bertis Ruddy, PharmD Clinical Pharmacist Please check AMION for all [place] numbers 08/16/2018 3:28 PM

## 2018-08-16 NOTE — ED Provider Notes (Signed)
[facility] EMERGENCY DEPARTMENT Provider Note   CSN: [id number] Arrival date & time: 08/16/18  1238    History   Chief Complaint Chief Complaint  Patient presents with   Altered Mental Status   Weakness    HPI Kyle Conrad is a 53 y.o. male.     HPI Patient reportedly brought in for weakness.  Reportedly found by neighbor on the floor.  Patient states he had just fallen.  However there is some question of whether he had been there overnight also.  Patient states he feels weak all over.  Swelling in his legs.  States he is had some trouble breathing.  States no fevers.  No abdominal pain.  Has breakdown of skin on his legs.  History of heart failure states he has been taking all of his medicines.  Most recent weight was 3 months ago and the heart failure clinic is 94 kg.  Patient is now 112 kg.  Patient had a sugar of 47 for EMS.  Got dextrose by EMS then up to 200.  Now down to 67 upon arrival.  No history of diabetes. Past Medical History:  Diagnosis Date   CHF (congestive heart failure) ([place])    Hypertension     Patient Active Problem List   Diagnosis Date Noted   Cardiogenic shock ([place]) 08/16/2018   Incarcerated left inguinal hernia 03/24/2018   Hyponatremia 03/24/2018   Other cirrhosis of liver ([place]) 03/24/2018   Indirect inguinal hernia    SBO (small bowel obstruction) ([facility])    Pressure injury of skin 03/18/2018   Acute hypoxemic respiratory failure ([place]) 03/11/2018   Chest pain 03/11/2018   Hyperbilirubinemia 03/11/2018   Acute exacerbation of CHF (congestive heart failure) ([place]) 03/10/2018   HTN (hypertension) 08/10/2015   Hypoalbuminemia 05/19/2015   Anasarca    Acute congestive heart failure ([place]) 04/22/2015   Chronic anemia 04/22/2015   Obstructive sleep apnea 12/15/2008   FATIGUE / MALAISE 10/23/2008   DYSPNEA 46/27/0350   Chronic systolic CHF (congestive heart failure) ([place]) 09/25/2008   Overweight 09/18/2008    VENTRICULAR TACHYCARDIA 09/18/2008    Past Surgical History:  Procedure Laterality Date   CARDIAC CATHETERIZATION N/A 04/30/2015   Procedure: Right/Left Heart Cath and Coronary Angiography;  Surgeon: Larey Dresser, MD;  Location: [place] CV LAB;  Service: Cardiovascular;  Laterality: N/A;   COLONOSCOPY WITH PROPOFOL N/A 05/01/2015   Procedure: COLONOSCOPY WITH PROPOFOL;  Surgeon: Wilford Corner, MD;  Location: [facility] ENDOSCOPY;  Service: Endoscopy;  Laterality: N/A;   ESOPHAGOGASTRODUODENOSCOPY (EGD) WITH PROPOFOL N/A 05/01/2015   Procedure: ESOPHAGOGASTRODUODENOSCOPY (EGD) WITH PROPOFOL;  Surgeon: Wilford Corner, MD;  Location: [facility] ENDOSCOPY;  Service: Endoscopy;  Laterality: N/A;   INGUINAL HERNIA REPAIR Left 03/24/2018   Procedure: HERNIA REPAIR INGUINAL INCARCERATED;  Surgeon: Georganna Skeans, MD;  Location: [place];  Service: General;  Laterality: Left;   INSERTION OF MESH Left 03/24/2018   Procedure: INSERTION OF MESH;  Surgeon: Georganna Skeans, MD;  Location: [place];  Service: General;  Laterality: Left;   SHOULDER SURGERY          Home Medications    Prior to Admission medications   Medication Sig Start Date End Date Taking? Authorizing Provider  acetaminophen (TYLENOL) 500 MG tablet You can take 1000 mg every 8 hours as needed for pain.  Can alternate this with ibuprofen, or Tramadol.  Do not exceed 4000 mg of Tylenol(acetaminophen) per day it can harm your liver.  You can buy this over-the-counter at  any drugstore. 03/28/18   Earnstine Regal, PA-C  docusate sodium (COLACE) 100 MG capsule Take 1 capsule (100 mg total) by mouth 2 (two) times daily. 03/20/18   Bonnielee Haff, MD  folic acid (FOLVITE) 1 MG tablet Take 1 tablet (1 mg total) by mouth daily. 03/20/18   Bonnielee Haff, MD  ibuprofen (ADVIL,MOTRIN) 200 MG tablet You can take 3 tablets every 6 hours as needed for pain.  You can alternate this with plain Tylenol or Tramadol.  You can buy this over-the-counter  at any drugstore without a prescription. 03/28/18   Earnstine Regal, PA-C  losartan (COZAAR) 25 MG tablet Take 0.5 tablets (12.5 mg total) by mouth at bedtime. 04/10/18   Georgiana Shore, NP  magnesium oxide (MAG-OX) 400 MG tablet Take 1 tablet (400 mg total) by mouth daily. 04/11/18   Georgiana Shore, NP  metolazone (ZAROXOLYN) 2.5 MG tablet Take 1 tablet (2.5 mg total) by mouth daily. As directed by heart failure clinic 06/05/18 09/03/18  Shirley Friar, PA-C  Multiple Vitamin (MULTIVITAMIN WITH MINERALS) TABS tablet Take 1 tablet by mouth daily. 03/20/18   Bonnielee Haff, MD  polyethylene glycol [facility] Floria Raveling) packet You can use this for constipation as instructed on the package directions.  You can buy this at any drugstore without a prescription. 03/28/18   Earnstine Regal, PA-C  potassium chloride 20 MEQ TBCR Take 20 mEq by mouth 2 (two) times daily. 06/05/18   Shirley Friar, PA-C  torsemide (DEMADEX) 20 MG tablet Take 2 tablets (40 mg total) by mouth 2 (two) times daily. 06/05/18   Shirley Friar, PA-C  traMADol (ULTRAM) 50 MG tablet Take 1 tablet (50 mg total) by mouth every 6 (six) hours as needed for moderate pain (Third line use if pain not relieved by PO Tylenol and Ibuprofen). 03/28/18   Earnstine Regal, PA-C    Family History Family History  Problem Relation Age of Onset   Lung cancer Mother     Social History Social History   Tobacco Use   Smoking status: Never Smoker   Smokeless tobacco: Never Used  Substance Use Topics   Alcohol use: No   Drug use: No     Allergies   Lisinopril and Penicillins   Review of Systems Review of Systems  Constitutional: Negative for appetite change.  HENT: Negative for congestion.   Respiratory: Positive for shortness of breath. Negative for cough.   Cardiovascular: Positive for leg swelling.  Gastrointestinal: Negative for abdominal pain.  Genitourinary: Negative for flank pain.    Musculoskeletal: Negative for back pain.  Skin: Positive for wound.  Neurological: Positive for weakness.  Psychiatric/Behavioral: Positive for confusion.     Physical Exam Updated Vital Signs BP (!) 70/59    Pulse (!) 59    Temp (!) 86.9 F (30.5 C) (Rectal)    Resp (!) 23    Wt 112 kg    BMI 33.49 kg/m   Physical Exam Vitals signs and nursing note reviewed.  HENT:     Head: Normocephalic.     Nose: Nose normal.  Eyes:     Extraocular Movements: Extraocular movements intact.  Neck:     Musculoskeletal: Neck supple.  Cardiovascular:     Rate and Rhythm: Regular rhythm.  Pulmonary:     Breath sounds: No wheezing or rales.  Abdominal:     General: There is distension.     Comments: Anasarca up to mid abdomen.  Musculoskeletal:     Comments: Pitting  edema bilateral lower legs with chronic changes also.  Had loss of left great toenail.  Has some sloughing of skin over the left foot.  Skin:    Findings: Erythema present.  Neurological:     Mental Status: He is alert.     Comments: Awake and able answer questions but somewhat slow to answer may have some mild confusion.  Appears to move all his extremities.        ED Treatments / Results  Labs (all labs ordered are listed, but only abnormal results are displayed) Labs Reviewed  COMPREHENSIVE METABOLIC PANEL - Abnormal; Notable for the following components:      Result Value   Chloride 113 (*)    CO2 20 (*)    Glucose, Bld 44 (*)    BUN 45 (*)    Creatinine, Ser 1.90 (*)    Calcium 8.5 (*)    Total Protein 6.0 (*)    Albumin 1.9 (*)    AST 48 (*)    Total Bilirubin 3.7 (*)    GFR calc non Af Amer 39 (*)    GFR calc Af Amer 45 (*)    All other components within normal limits  LACTIC ACID, PLASMA - Abnormal; Notable for the following components:   Lactic Acid, Venous 3.1 (*)    All other components within normal limits  CBC WITH DIFFERENTIAL/PLATELET - Abnormal; Notable for the following components:    Hemoglobin 12.5 (*)    RDW 17.7 (*)    Platelets 53 (*)    nRBC 0.5 (*)    Lymphs Abs 0.1 (*)    Monocytes Absolute 0.0 (*)    Eosinophils Absolute 1.8 (*)    Abs Immature Granulocytes 0.48 (*)    All other components within normal limits  TSH - Abnormal; Notable for the following components:   TSH 5.578 (*)    All other components within normal limits  CBG MONITORING, ED - Abnormal; Notable for the following components:   Glucose-Capillary 67 (*)    All other components within normal limits  CBG MONITORING, ED - Abnormal; Notable for the following components:   Glucose-Capillary 30 (*)    All other components within normal limits  CBG MONITORING, ED - Abnormal; Notable for the following components:   Glucose-Capillary 63 (*)    All other components within normal limits  CULTURE, BLOOD (ROUTINE X 2)  CULTURE, BLOOD (ROUTINE X 2)  CULTURE, BLOOD (ROUTINE X 2)  CULTURE, BLOOD (ROUTINE X 2)  URINE CULTURE  CULTURE, RESPIRATORY  MRSA PCR SCREENING  TROPONIN I  CK  AMMONIA  LACTIC ACID, PLASMA  URINALYSIS, ROUTINE W REFLEX MICROSCOPIC  CBC  CORTISOL  COMPREHENSIVE METABOLIC PANEL  MAGNESIUM  PHOSPHORUS  AMYLASE  LACTIC ACID, PLASMA  LACTIC ACID, PLASMA  PROCALCITONIN  BRAIN NATRIURETIC PEPTIDE  CBC WITH DIFFERENTIAL/PLATELET  PROTIME-INR  D-DIMER, QUANTITATIVE (NOT AT [facility])  APTT  URINALYSIS, ROUTINE W REFLEX MICROSCOPIC  STREP PNEUMONIAE URINARY ANTIGEN  LEGIONELLA PNEUMOPHILA SEROGP 1 UR AG  T3, FREE  T4, FREE  CBG MONITORING, ED    EKG EKG Interpretation  Date/Time:  Thursday August 16 2018 13:04:58 EDT Ventricular Rate:  131 PR Interval:    QRS Duration: 120 QT Interval:  300 QTC Calculation: 404 R Axis:   133 Text Interpretation:  Sinus rhythm Low voltage QRS Nonspecific intraventricular conduction delay Anterior infarct, age indeterminate Confirmed by Davonna Belling [phone]) on 08/16/2018 1:10:31 PM   Radiology Dg Chest Portable 1 View  Result  Date: 08/16/2018 CLINICAL DATA:  Initial evaluation for acute shortness of breath. EXAM: PORTABLE CHEST 1 VIEW COMPARISON:  Prior radiograph from 03/24/2018. FINDINGS: Advanced cardiomegaly, similar to previous. Mediastinal silhouette within normal limits. Lungs mildly hypoinflated. Diffuse pulmonary vascular congestion with interstitial prominence, suggesting mild pulmonary interstitial congestion/edema. No definite pleural effusion. No consolidative opacity. No pneumothorax. No acute osseous finding. Prominent degenerative changes about the shoulders bilaterally. IMPRESSION: 1. Cardiomegaly with mild diffuse pulmonary interstitial congestion/edema. 2. No other active cardiopulmonary disease. Electronically Signed   By: Jeannine Boga M.D.   On: 08/16/2018 13:50   Dg Abd Portable 1 View  Result Date: 08/16/2018 CLINICAL DATA:  Initial evaluation for acute abdominal distension. EXAM: PORTABLE ABDOMEN - 1 VIEW COMPARISON:  Prior radiograph from 03/24/2018 FINDINGS: Visualized bowel gas pattern within normal limits without obstruction or ileus. Mild gaseous distension of the stomach. No abnormal bowel wall thickening. No appreciable free air on this limited single supine partial view of the abdomen. No soft tissue mass or abnormal calcification. Visualized osseous structures demonstrate no acute finding. Osteoarthritic changes about the hips bilaterally. Degenerative changes noted within the lower lumbar spine. IMPRESSION: Nonobstructive bowel gas pattern with no radiographic evidence for acute intra-abdominal pathology. Electronically Signed   By: Jeannine Boga M.D.   On: 08/16/2018 13:54    Procedures Procedures (including critical care time)  Medications Ordered in ED Medications  vancomycin (VANCOCIN) 2,000 mg in sodium chloride 0.9 % 500 mL IVPB (2,000 mg Intravenous New Bag/Given 08/16/18 1409)  dextrose 5 %-0.45 % sodium chloride infusion ( Intravenous New Bag/Given 08/16/18 1407)  heparin  injection 5,000 Units (has no administration in time range)  hydrocortisone sodium succinate (SOLU-CORTEF) 100 MG injection 50 mg (has no administration in time range)  dextrose 10 % infusion (has no administration in time range)  ondansetron (ZOFRAN) injection 4 mg (has no administration in time range)  famotidine (PEPCID) IVPB 20 mg in NS 100 mL IVPB (has no administration in time range)  0.9 %  sodium chloride infusion (has no administration in time range)  norepinephrine (LEVOPHED) 4mg  in 260mL premix infusion (2 mcg/min Intravenous New Bag/Given 08/16/18 1517)  levofloxacin (LEVAQUIN) IVPB 750 mg (750 mg Intravenous New Bag/Given 08/16/18 1357)  dextrose 50 % solution 50 mL (50 mLs Intravenous Given 08/16/18 1352)  sodium chloride 0.9 % bolus 500 mL (500 mLs Intravenous New Bag/Given 08/16/18 1408)     Initial Impression / Assessment and Plan / ED Course  I have reviewed the triage vital signs and the nursing notes.  Pertinent labs & imaging results that were available during my care of the patient were reviewed by me and considered in my medical decision making (see chart for details).       Patient brought in for weakness and found on floor.  May have some confusion with it.  Hypotensive.  Had been hypoglycemic.  New low QRS amplitude on EKG.  Moderate edema.  Weight is up at least 16 kg from most recent hospital visit.  Weeping edema and sloughing of skin on foot.  However is hypothermic also.  Will treat as is if this is infection on the feet.  However I am not sure of his volume status at this time.  He has congestive heart failure with an EF of somewhere between 10 and 25%.  With the extra fluid I am worried that he still could be intravascularly volume overloaded.  Will wait on further information before fluid boluses started.  Will discuss with heart failure  service also.  Seen by ICU.  Will admit patient.  Dr. Haroldine Laws also will help manage. Unclear at this point if the shock is due to  infection or just hypoperfusion as a cardiogenic shock.  He is overall volume overloaded and did not appear to need the 30/kg bolus at this time.  May have needing pressors or other cardiac support.  CRITICAL CARE Performed by: Davonna Belling Total critical care time: 30 minutes Critical care time was exclusive of separately billable procedures and treating other patients. Critical care was necessary to treat or prevent imminent or life-threatening deterioration. Critical care was time spent personally by me on the following activities: development of treatment plan with patient and/or surrogate as well as nursing, discussions with consultants, evaluation of patient's response to treatment, examination of patient, obtaining history from patient or surrogate, ordering and performing treatments and interventions, ordering and review of laboratory studies, ordering and review of radiographic studies, pulse oximetry and re-evaluation of patient's condition.   Final Clinical Impressions(s) / ED Diagnoses   Final diagnoses:  Shock ([place])  Hypoglycemia  Acute on chronic congestive heart failure, unspecified heart failure type [facility]    ED Discharge Orders    None       Davonna Belling, MD 08/16/18 1545

## 2018-08-16 NOTE — H&P (Addendum)
NAME:  Kyle Conrad, MRN:  [id number], DOB:  22-Nov-1965, LOS: 0 ADMISSION DATE:  08/16/2018, CONSULTATION DATE:  4/2 REFERRING MD:  Alvino Chapel, CHIEF COMPLAINT:  4/2   Brief History   53 year old male with biventricular heart failure, EF 10 to 15%.  Being admitted 4/2 with acute mental status change, and decompensated biventricular heart failure and shock (favor cardiogenic)  History of present illness   53 year old male patient followed at the heart failure clinic with known ejection fraction of 10 to 15% with biventricular heart failure. Presents to the emergency room on 4/2 after being found by a neighbor on the floor at his house.  Reportedly he had just fallen but it is unclear exactly when this occurred as it was unwitnessed.  He was confused, reporting weakness to be generalized.  His weight had increased.  Last weight recorded in the clinic was 94 kg, he weighed 112 kg on presentation.  On EMS arrival his blood glucose was 47.  On arrival he was hypothermic with temperature of 86.9 Fahrenheit hypotensive with systolic blood pressure initially in the 80s but then down into the 50s he had diffuse 3-4+ pitting edema requiring 4 L to keep saturations greater than 90%.  Because of his hypotension, as well as altered sensorium the critical care team was asked to evaluate.   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  Severe mitral valve regurgitation. Diverticulosis Angioedema with ACE inhibitor Iron deficiency anemia Possible cirrhosis Significant Hospital Events   4/2 admitted with hypoxia, delirium, and decompensated biventricular heart failure with circulatory shock   Consults:  Failure 4/2  Procedures:    Significant Diagnostic Tests:    Micro Data:  Blood cultures x2/2 Urine culture 4/2 U strep 4/2>>> u legionella 4/2>>>  Antimicrobials:  Levofloxacin 4/2 Vancomycin 4/2   Interim history/subjective:  Confused   Objective   Blood pressure (Abnormal) 84/56, temperature (Abnormal) 86.9 F (30.5 C), temperature source Rectal, resp. rate 20, weight 112 kg.       No intake or output data in the 24 hours ending 08/16/18 1426 Filed Weights   08/16/18 1312  Weight: 112 kg    Examination: General: Obese 53 year old male patient lying in bed HENT: Mucous membranes moist positive neck vein distention sclera were nonicteric Lungs: Diminished throughout no accessory use Cardiovascular: Distant heart sounds normal sinus Abdomen: Distended hypoactive Extremities: Diffuse edema with venous stasis changes and open wounds on top of left anterior foot/toes  neuro: Weak, moves everything but less confused GU: Due to void  Resolved Hospital Problem list    Assessment & Plan:   Acute Hypoxic Respiratory failure in setting of decompensated HF w/ volume overload/pulm edema; can't exclude possible CAP Plan Supplemental oxygen IV lasix  Pulse OX NPO  Acute decompensated Biventricular Heart failure w/ circulatory shock. Favor Cardiogenic shock but can't rule out possible sepsis Plan Admit to ICU IV levophed IV lasix once BP improved w/ lasix Daily weight Hold antihypertensives Pan culture  Cont day 1 levaquin and vanc (watch QTc closely w/ levaquin) Consult HF team Stress dose steroids  Acute metabolic encephalopathy  Plan Cont supportive care Hold sedating meds Awaiting chemistry   Mild lactic acidosis  Plan Treat shock Consider central access if needs high level peripheral pressors   Hypoglycemia Plan Trend hourly glucose  Starting D10  Best practice:  Diet: NPO Pain/Anxiety/Delirium protocol (if indicated): na VAP protocol (if indicated): NA DVT prophylaxis:  [place] heparin  GI prophylaxis: na Glucose control: na; but checking hourly cbgs Mobility: BR Code Status: full code  Family Communication: pending  Disposition: critically ill due to  need for titration of pressors and need for close obs in the ICU   Labs   CBC: No results for input(s): WBC, NEUTROABS, HGB, HCT, MCV, PLT in the last 168 hours.  Basic Metabolic Panel: No results for input(s): NA, K, CL, CO2, GLUCOSE, BUN, CREATININE, CALCIUM, MG, PHOS in the last 168 hours. GFR: CrCl cannot be calculated (Patient's most recent lab result is older than the maximum 21 days allowed.). No results for input(s): PROCALCITON, WBC, LATICACIDVEN in the last 168 hours.  Liver Function Tests: No results for input(s): AST, ALT, ALKPHOS, BILITOT, PROT, ALBUMIN in the last 168 hours. No results for input(s): LIPASE, AMYLASE in the last 168 hours. No results for input(s): AMMONIA in the last 168 hours.  ABG    Component Value Date/Time   PHART 7.468 (H) 03/10/2018 2040   PCO2ART 26.0 (L) 03/10/2018 2040   PO2ART 380.0 (H) 03/10/2018 2040   HCO3 18.8 (L) 03/10/2018 2040   TCO2 20 (L) 03/10/2018 2040   ACIDBASEDEF 3.0 (H) 03/10/2018 2040   O2SAT 92.8 03/20/2018 0900     Coagulation Profile: No results for input(s): INR, PROTIME in the last 168 hours.  Cardiac Enzymes: No results for input(s): CKTOTAL, CKMB, CKMBINDEX, TROPONINI in the last 168 hours.  HbA1C: Hgb A1c MFr Bld  Date/Time Value Ref Range Status  09/10/2008 12:40 PM  4.6 - 6.1 % Final   6.0 (NOTE) The ADA recommends the following therapeutic goal for glycemic control related to Hgb A1c measurement: Goal of therapy: <6.5 Hgb A1c  Reference: American Diabetes Association: Clinical Practice Recommendations 2010, Diabetes Care, 2010, 33: (Suppl  1).    CBG: Recent Labs  Lab 08/16/18 1301 08/16/18 1346 08/16/18 1416  GLUCAP 67* 30* 88    Review of Systems:   na  Past Medical History  He,  has a past medical history of CHF (congestive heart failure) (HCC) and Hypertension.   Surgical History    Past Surgical History:  Procedure Laterality Date  . CARDIAC CATHETERIZATION N/A 04/30/2015    Procedure: Right/Left Heart Cath and Coronary Angiography;  Surgeon: Larey Dresser, MD;  Location: [place] [facility];  Service: Cardiovascular;  Laterality: N/A;  . COLONOSCOPY WITH PROPOFOL N/A 05/01/2015   Procedure: COLONOSCOPY WITH PROPOFOL;  Surgeon: Wilford Corner, MD;  Location: [facility] ENDOSCOPY;  Service: Endoscopy;  Laterality: N/A;  . ESOPHAGOGASTRODUODENOSCOPY (EGD) WITH PROPOFOL N/A 05/01/2015   Procedure: ESOPHAGOGASTRODUODENOSCOPY (EGD) WITH PROPOFOL;  Surgeon: Wilford Corner, MD;  Location: [facility] ENDOSCOPY;  Service: Endoscopy;  Laterality: N/A;  . INGUINAL HERNIA REPAIR Left 03/24/2018   Procedure: HERNIA REPAIR INGUINAL INCARCERATED;  Surgeon: Georganna Skeans, MD;  Location: [place];  Service: General;  Laterality: Left;  . INSERTION OF MESH Left 03/24/2018   Procedure: INSERTION OF MESH;  Surgeon: Georganna Skeans, MD;  Location: [place];  Service: General;  Laterality: Left;  . SHOULDER SURGERY       Social History   reports that he has never smoked. He has never used smokeless tobacco. He reports that he does not drink alcohol or use drugs.   Family History   His family history includes Lung cancer in his mother.   Allergies Allergies  Allergen Reactions  . Lisinopril Swelling    REACTION: pt had a swelling episode.  His lips swelled  . Penicillins Hives  Has patient had a PCN reaction causing immediate rash, facial/tongue/throat swelling, SOB or lightheadedness with hypotension: Yes Has patient had a PCN reaction causing severe rash involving mucus membranes or skin necrosis: No Has patient had a PCN reaction that required hospitalization: No Has patient had a PCN reaction occurring within the last 10 years: No If all of the above answers are "NO", then may proceed with Cephalosporin use.     Home Medications  Prior to Admission medications   Medication Sig Start Date End Date Taking? Authorizing Provider  acetaminophen (TYLENOL) 500 MG tablet You can take 1000 mg  every 8 hours as needed for pain.  Can alternate this with ibuprofen, or Tramadol.  Do not exceed 4000 mg of Tylenol(acetaminophen) per day it can harm your liver.  You can buy this over-the-counter at any drugstore. 03/28/18   Earnstine Regal, PA-C  docusate sodium (COLACE) 100 MG capsule Take 1 capsule (100 mg total) by mouth 2 (two) times daily. 03/20/18   Bonnielee Haff, MD  folic acid (FOLVITE) 1 MG tablet Take 1 tablet (1 mg total) by mouth daily. 03/20/18   Bonnielee Haff, MD  ibuprofen (ADVIL,MOTRIN) 200 MG tablet You can take 3 tablets every 6 hours as needed for pain.  You can alternate this with plain Tylenol or Tramadol.  You can buy this over-the-counter at any drugstore without a prescription. 03/28/18   Earnstine Regal, PA-C  losartan (COZAAR) 25 MG tablet Take 0.5 tablets (12.5 mg total) by mouth at bedtime. 04/10/18   Georgiana Shore, NP  magnesium oxide (MAG-OX) 400 MG tablet Take 1 tablet (400 mg total) by mouth daily. 04/11/18   Georgiana Shore, NP  metolazone (ZAROXOLYN) 2.5 MG tablet Take 1 tablet (2.5 mg total) by mouth daily. As directed by heart failure clinic 06/05/18 09/03/18  Shirley Friar, PA-C  Multiple Vitamin (MULTIVITAMIN WITH MINERALS) TABS tablet Take 1 tablet by mouth daily. 03/20/18   Bonnielee Haff, MD  polyethylene glycol [facility] / Floria Raveling) packet You can use this for constipation as instructed on the package directions.  You can buy this at any drugstore without a prescription. 03/28/18   Earnstine Regal, PA-C  potassium chloride 20 MEQ TBCR Take 20 mEq by mouth 2 (two) times daily. 06/05/18   Shirley Friar, PA-C  torsemide (DEMADEX) 20 MG tablet Take 2 tablets (40 mg total) by mouth 2 (two) times daily. 06/05/18   Shirley Friar, PA-C  traMADol (ULTRAM) 50 MG tablet Take 1 tablet (50 mg total) by mouth every 6 (six) hours as needed for moderate pain (Third line use if pain not relieved by PO Tylenol and Ibuprofen). 03/28/18    Earnstine Regal, PA-C     Critical care time: [place] ACNP-BC [place] Pager # [phone] OR # [phone] if no answer  Attending Note:  53 year old male with extensive cardiac PMH who presents to PCCM hypotensive, hypothermic and hypoglycemic.  He was found down by his neighbor.  He is very confused.  On exam, decreased BS diffusely.  I reviewed CXR myself, LLL infiltrate noted with air bronchograms.  Discussed with EDP.  Admit to the ICU.  Pan culture, PCT, levaquin/vanc, lasix, levophed, call advance CHF team for consultation, check cortisol level and start stress dose steroids.  PCCM will admit.  The patient is critically ill with multiple organ systems failure and requires high complexity decision making for assessment and support, frequent evaluation and titration of therapies,  application of advanced monitoring technologies and extensive interpretation of multiple databases.   Critical Care Time devoted to patient care services described in this note is  34  Minutes. This time reflects time of care of this signee Dr Jennet Maduro. This critical care time does not reflect procedure time, or teaching time or supervisory time of PA/NP/Med student/Med Resident etc but could involve care discussion time.  Rush Farmer, M.D. [facility] Pulmonary/Critical Care Medicine. Pager: [phone]. After hours pager: [phone].

## 2018-08-16 NOTE — ED Notes (Signed)
ED TO INPATIENT HANDOFF REPORT  ED Nurse Name and Phone #: [phone] Loreal Schuessler  S Name/Age/Gender Kyle Conrad 53 y.o. male Room/Bed: 029C/029C  Code Status   Code Status: Full Code  Home/SNF/Other Home Patient oriented to: self, place, time and situation Is this baseline? Yes   Triage Complete: Triage complete  Chief Complaint hypotension hypoglycemia  Triage Note No notes on file   Allergies Allergies  Allergen Reactions  . Lisinopril Swelling    REACTION: pt had a swelling episode.  His lips swelled  . Penicillins Hives    Has patient had a PCN reaction causing immediate rash, facial/tongue/throat swelling, SOB or lightheadedness with hypotension: Yes Has patient had a PCN reaction causing severe rash involving mucus membranes or skin necrosis: No Has patient had a PCN reaction that required hospitalization: No Has patient had a PCN reaction occurring within the last 10 years: No If all of the above answers are "NO", then may proceed with Cephalosporin use.    Level of Care/Admitting Diagnosis ED Disposition    ED Disposition Condition Comment   Admit  Hospital Area: [place] [[id number]]  Level of Care: ICU [6]  Diagnosis: Cardiogenic shock ([place]) [785.51.ICD-9-CM]  Admitting Physician: Rush Farmer [[id number]]  Attending Physician: Rush Farmer [phone]  Estimated length of stay: past midnight tomorrow  Certification:: I certify this patient will need inpatient services for at least 2 midnights  PT Class (Do Not Modify): Inpatient [101]  PT Acc Code (Do Not Modify): Private [1]       B Medical/Surgery History Past Medical History:  Diagnosis Date  . CHF (congestive heart failure) ([place])   . Hypertension    Past Surgical History:  Procedure Laterality Date  . CARDIAC CATHETERIZATION N/A 04/30/2015   Procedure: Right/Left Heart Cath and Coronary Angiography;  Surgeon: Larey Dresser, MD;  Location: [place] CV LAB;  Service:  Cardiovascular;  Laterality: N/A;  . COLONOSCOPY WITH PROPOFOL N/A 05/01/2015   Procedure: COLONOSCOPY WITH PROPOFOL;  Surgeon: Wilford Corner, MD;  Location: [facility] ENDOSCOPY;  Service: Endoscopy;  Laterality: N/A;  . ESOPHAGOGASTRODUODENOSCOPY (EGD) WITH PROPOFOL N/A 05/01/2015   Procedure: ESOPHAGOGASTRODUODENOSCOPY (EGD) WITH PROPOFOL;  Surgeon: Wilford Corner, MD;  Location: [facility] ENDOSCOPY;  Service: Endoscopy;  Laterality: N/A;  . INGUINAL HERNIA REPAIR Left 03/24/2018   Procedure: HERNIA REPAIR INGUINAL INCARCERATED;  Surgeon: Georganna Skeans, MD;  Location: [place];  Service: General;  Laterality: Left;  . INSERTION OF MESH Left 03/24/2018   Procedure: INSERTION OF MESH;  Surgeon: Georganna Skeans, MD;  Location: [place];  Service: General;  Laterality: Left;  . SHOULDER SURGERY       A IV Location/Drains/Wounds Patient Lines/Drains/Airways Status   Active Line/Drains/Airways    Name:   Placement date:   Placement time:   Site:   Days:   Peripheral IV 08/16/18 Left Wrist   08/16/18    -    Wrist   less than 1   Peripheral IV 08/16/18 Right Antecubital   08/16/18    1330    Antecubital   less than 1   Incision (Closed) 03/24/18 Abdomen   03/24/18    2019     145          Intake/Output Last 24 hours No intake or output data in the 24 hours ending 08/16/18 1500  Labs/Imaging Results for orders placed or performed during the hospital encounter of 08/16/18 (from the past 48 hour(s))  CBG monitoring, ED     Status:  Abnormal   Collection Time: 08/16/18  1:01 PM  Result Value Ref Range   Glucose-Capillary 67 (L) 70 - 99 mg/dL  Lactic acid, plasma     Status: Abnormal   Collection Time: 08/16/18  1:33 PM  Result Value Ref Range   Lactic Acid, Venous 3.1 (HH) 0.5 - 1.9 mmol/L    Comment: CRITICAL RESULT CALLED TO, READ BACK BY AND VERIFIED WITH: COBB,K RN @1436  ON [id number] BY FLEMINGS Performed at [facility], [place] [address], [place], [place] [address]   CBC with Differential      Status: Abnormal (Preliminary result)   Collection Time: 08/16/18  1:33 PM  Result Value Ref Range   WBC 4.3 4.0 - 10.5 K/uL   RBC 4.41 4.22 - 5.81 MIL/uL   Hemoglobin 12.5 (L) 13.0 - 17.0 g/dL   HCT 40.6 39.0 - 52.0 %   MCV 92.1 80.0 - 100.0 fL   MCH 28.3 26.0 - 34.0 pg   MCHC 30.8 30.0 - 36.0 g/dL   RDW 17.7 (H) 11.5 - 15.5 %   Platelets 53 (L) 150 - 400 K/uL    Comment: REPEATED TO VERIFY PLATELET COUNT CONFIRMED BY SMEAR SPECIMEN CHECKED FOR CLOTS Immature Platelet Fraction may be clinically indicated, consider ordering this additional test [id number]    nRBC 0.5 (H) 0.0 - 0.2 %    Comment: Performed at [facility], [place] [address], [place], [place] [address]   Neutrophils Relative % PENDING %   Neutro Abs PENDING 1.7 - 7.7 K/uL   Band Neutrophils PENDING %   Lymphocytes Relative PENDING %   Lymphs Abs PENDING 0.7 - 4.0 K/uL   Monocytes Relative PENDING %   Monocytes Absolute PENDING 0.1 - 1.0 K/uL   Eosinophils Relative PENDING %   Eosinophils Absolute PENDING 0.0 - 0.5 K/uL   Basophils Relative PENDING %   Basophils Absolute PENDING 0.0 - 0.1 K/uL   WBC Morphology PENDING    RBC Morphology PENDING    Smear Review PENDING    Other PENDING %   nRBC PENDING 0 /100 WBC   Metamyelocytes Relative PENDING %   Myelocytes PENDING %   Promyelocytes Relative PENDING %   Blasts PENDING %  Ammonia     Status: None   Collection Time: 08/16/18  1:33 PM  Result Value Ref Range   Ammonia 20 9 - 35 umol/L    Comment: Performed at [facility], [place] [address] [place], [place] [address]  CBG monitoring, ED     Status: Abnormal   Collection Time: 08/16/18  1:46 PM  Result Value Ref Range   Glucose-Capillary 30 (LL) 70 - 99 mg/dL   Comment 1 Notify RN    Comment 2 Document in Chart   CBG monitoring, ED     Status: None   Collection Time: 08/16/18  2:16 PM  Result Value Ref Range   Glucose-Capillary 88 70 - 99 mg/dL   Dg Chest Portable 1 View  Result Date:  08/16/2018 CLINICAL DATA:  Initial evaluation for acute shortness of breath. EXAM: PORTABLE CHEST 1 VIEW COMPARISON:  Prior radiograph from 03/24/2018. FINDINGS: Advanced cardiomegaly, similar to previous. Mediastinal silhouette within normal limits. Lungs mildly hypoinflated. Diffuse pulmonary vascular congestion with interstitial prominence, suggesting mild pulmonary interstitial congestion/edema. No definite pleural effusion. No consolidative opacity. No pneumothorax. No acute osseous finding. Prominent degenerative changes about the shoulders bilaterally. IMPRESSION: 1. Cardiomegaly with mild diffuse pulmonary interstitial congestion/edema. 2. No other active cardiopulmonary  disease. Electronically Signed   By: Jeannine Boga M.D.   On: 08/16/2018 13:50   Dg Abd Portable 1 View  Result Date: 08/16/2018 CLINICAL DATA:  Initial evaluation for acute abdominal distension. EXAM: PORTABLE ABDOMEN - 1 VIEW COMPARISON:  Prior radiograph from 03/24/2018 FINDINGS: Visualized bowel gas pattern within normal limits without obstruction or ileus. Mild gaseous distension of the stomach. No abnormal bowel wall thickening. No appreciable free air on this limited single supine partial view of the abdomen. No soft tissue mass or abnormal calcification. Visualized osseous structures demonstrate no acute finding. Osteoarthritic changes about the hips bilaterally. Degenerative changes noted within the lower lumbar spine. IMPRESSION: Nonobstructive bowel gas pattern with no radiographic evidence for acute intra-abdominal pathology. Electronically Signed   By: Jeannine Boga M.D.   On: 08/16/2018 13:54    Pending Labs Unresulted Labs (From admission, onward)    Start     Ordered   08/17/18 0500  CBC  Tomorrow morning,   R     08/16/18 1455   08/17/18 2536  Basic metabolic panel  Tomorrow morning,   R     08/16/18 1455   08/17/18 0500  Magnesium  Tomorrow morning,   R     08/16/18 1455   08/17/18 0500   Phosphorus  Tomorrow morning,   R     08/16/18 1455   08/16/18 1453  Strep pneumoniae urinary antigen  (not at [facility])  Once,   R     08/16/18 1455   08/16/18 1453  Legionella Pneumophila Serogp 1 Ur Ag  Once,   R     08/16/18 1455   08/16/18 1452  Comprehensive metabolic panel  Once,   R     08/16/18 1455   08/16/18 1452  Magnesium  Once,   R     08/16/18 1455   08/16/18 1452  Phosphorus  Once,   R     08/16/18 1455   08/16/18 1452  Amylase  Once,   R     08/16/18 1455   08/16/18 1452  Lactic acid, plasma  STAT Now then every 3 hours,   R     08/16/18 1455   08/16/18 1452  Procalcitonin  Once,   R     08/16/18 1455   08/16/18 1452  Brain natriuretic peptide  Once,   R     08/16/18 1455   08/16/18 1452  CBC WITH DIFFERENTIAL  Once,   R     08/16/18 1455   08/16/18 1452  Protime-INR  Once,   R     08/16/18 1455   08/16/18 1452  D-dimer, quantitative (not at [facility])  Once,   R     08/16/18 1455   08/16/18 1452  APTT  Once,   R     08/16/18 1455   08/16/18 1452  Culture, blood (routine x 2)  BLOOD CULTURE X 2,   R     08/16/18 1455   08/16/18 1452  Urine culture  Once,   R     08/16/18 1455   08/16/18 1452  Culture, respiratory (tracheal aspirate)  Once,   R     08/16/18 1455   08/16/18 1452  Urinalysis, Routine w reflex microscopic  Once,   R     08/16/18 1455   08/16/18 1450  CBC  (heparin)  Once,   R    Comments:  Baseline for heparin therapy IF NOT ALREADY DRAWN.  Notify MD if PLT < 100 K.  08/16/18 1455   08/16/18 1450  Cortisol  Once,   R     08/16/18 1455   08/16/18 1336  TSH  ONCE - STAT,   STAT     08/16/18 1335   08/16/18 1335  Culture, blood (routine x 2)  BLOOD CULTURE X 2,   STAT     08/16/18 1335   08/16/18 1312  Comprehensive metabolic panel  Once,   STAT     08/16/18 1311   08/16/18 1312  Lactic acid, plasma  Now then every 2 hours,   STAT     08/16/18 1311   08/16/18 1312  Troponin I - Once  Once,   STAT     08/16/18 1311   08/16/18 1312  Urinalysis,  Routine w reflex microscopic  ONCE - STAT,   STAT     08/16/18 1311   08/16/18 1312  CK  ONCE - STAT,   STAT     08/16/18 1311          Vitals/Pain Today's Vitals   08/16/18 1314 08/16/18 1339 08/16/18 1430 08/16/18 1445  BP:   (!) 83/63 (!) 63/32  Resp: 20   (!) 22  Temp:  (!) 86.9 F (30.5 C)    TempSrc:  Rectal    Weight:        Isolation Precautions No active isolations  Medications Medications  levofloxacin (LEVAQUIN) IVPB 750 mg (750 mg Intravenous New Bag/Given 08/16/18 1357)  vancomycin (VANCOCIN) 2,000 mg in sodium chloride 0.9 % 500 mL IVPB (2,000 mg Intravenous New Bag/Given 08/16/18 1409)  dextrose 5 %-0.45 % sodium chloride infusion ( Intravenous New Bag/Given 08/16/18 1407)  heparin injection 5,000 Units (has no administration in time range)  hydrocortisone sodium succinate (SOLU-CORTEF) 100 MG injection 50 mg (has no administration in time range)  dextrose 10 % infusion (has no administration in time range)  ondansetron (ZOFRAN) injection 4 mg (has no administration in time range)  famotidine (PEPCID) IVPB 20 mg in NS 100 mL IVPB (has no administration in time range)  0.9 %  sodium chloride infusion (has no administration in time range)  norepinephrine (LEVOPHED) 4mg  in 247mL premix infusion (has no administration in time range)  dextrose 50 % solution 50 mL (50 mLs Intravenous Given 08/16/18 1352)  sodium chloride 0.9 % bolus 500 mL (500 mLs Intravenous New Bag/Given 08/16/18 1408)    Mobility non-ambulatory High fall risk   Focused Assessments Cardiac Assessment Handoff:    Lab Results  Component Value Date   CKTOTAL 491 (H) 09/09/2008   CKMB 5.4 (H) 09/09/2008   TROPONINI 0.04 (HH) 03/11/2018   Lab Results  Component Value Date   DDIMER (H) 09/08/2008    0.64        AT THE INHOUSE ESTABLISHED CUTOFF VALUE OF 0.48 ug/mL FEU, THIS ASSAY HAS BEEN DOCUMENTED IN THE LITERATURE TO HAVE A SENSITIVITY AND NEGATIVE PREDICTIVE VALUE OF AT LEAST 98 TO 99%.   THE TEST RESULT SHOULD BE CORRELATED WITH AN ASSESSMENT OF THE CLINICAL PROBABILITY OF DVT / VTE.   Does the Patient currently have chest pain? No     R Recommendations: See Admitting Provider Note  Report given to:   Additional Notes: pt CBG 41 on EMS arrival, given D10, CBG then 263. On arrival to ED, CBG 30. Pt given

## 2018-08-16 NOTE — Progress Notes (Addendum)
[facility] Progress Note Patient Name: Bastian Andreoli DOB: 1965/08/11 MRN: [id number]   Date of Service  08/16/2018  HPI/Events of Note  Notified of hypoglycemia.  52/M found down at home.  On video assessment, he is awake and alert.   eICU Interventions  Allow sips of juices after bedside swallow evaluation.      Intervention Category Minor Interventions: Other:  [name] 08/16/2018, 8:27 PM    8:46 PM Follow up sugar remains in the 40s.   Plan> Give D50, 1/2 amp now.   Trial of glucagon.  Concentrate dextrose solution to D20% to run at 74ml/hr as discussed with pharmacy.   12:26 AM  He remains hypoglycemic even after D20% gtt and glucagon.  Serum glucose was sent to recheck and confirms POC glucose which is at 36.  Plan> Give 1amp of D50.  Another trial of glucagon.  Start D50 to run at 40cc/hr as discussed with pharmacy.  Consider checking c-peptide.

## 2018-08-16 NOTE — Consult Note (Signed)
Advanced Heart Failure Team Consult Note   Primary Physician: Dorena Dew, FNP PCP-Cardiologist:  Dr Aundra Dubin  Reason for Consultation: Cardiogenic shock  HPI:    Kyle Conrad is seen today for evaluation of cardiogenic shock at the request of Dr Nelda Marseille.   Kyle Conrad is a 53 y.o. 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  He was initially diagnosed with CHF in 2010.  He was admitted then and treated for CHF exacerbation, EF was low.  However, on repeat echo in 2010, EF was back to normal and he was lost to followup.    He was admitted in 12/16 with acute systolic CHF.  Dyspnea had gradually built up and he had anasarca.  Echo showed EF 10-15% with diffuse hypokinesis, moderate RV dysfunction, and moderate to severe MR.  Cardiac output was low and he was started on milrinone gtt and IV Lasix.  He was diuresed extensively and had coronary angiography showing no CAD.  He was weaned off milrinone and sent home.  During this stay, he was also noted to have iron deficiency anemia and FOBT+.  Colonscopy showed diverticulosis and EGD showed a nonbleeding duodenal ulcer.  He had hypoalbuminemia, elevated INR, and ascites but imaging of the liver was not suggestive of cirrhosis.   Admitted 89/3810 with A/C systolic HF. He had been off medications for 2 years. HF team consulted. Diuresed 72 lbs with lasix drip and then transitioned to torsemide 40 mg daily. HF medications optimized. Hydral/imdur held with soft BP. He was referred to HF paramedicine. DC weight: 163 lbs.   Admitted 11/9-11/13/19 with incarcerated left inguinal hernia, causing small bowel obstruction. Had surgery on 11/9. No changes to HF medications.  Last seen in HF clinic 06/05/18. Weight was up 40 lbs after being out of torsemide. He refused admission. He was given IV lasix in clinic. Torsemide was resumed at 40 mg BID and he was instructed to take metolazone for 2 days. Coreg was stopped with  volume overload. He was supposed to follow up in a few days, but has not been seen since. He had been discharged from paramedicine due to no follow up. Paramedicine tried to reengage, but he did not return calls so was discharged again. Weight was 207 lbs.  He presented to ED today after his neighbor found him on the floor. Confused on arrival. Sugar was 47 per EMS and he was given dextrose. He reported weakness, +BLE edema, and SOB. Weight is up to 247 lbs today. He was hypothermic on arrival to ER with temp 86.9. Became hypotensive with SBP 50s and is now on levophed. Started on empiric antibiotics with concern for sepsis, but thought more likely to be cardiogenic shock. He was started on D10 drip for ongoing low blood sugars. Started on stress dose steroids.   Currently lethargic but answers questions. Lying flat. Denies SOB or CP. Very weak.   Pertinent admission labs include lactate 3.1, WBC 4.3, Hgb 12.5, ammonia 20, TSH 5.578, blood cx pending, CMET pending, BNP and PCT pending.   CXR: CM with mild diffuse pulmonary edema KUB: no evidence for acute intra-abdominal pathology  Echo 03/11/18: EF 20-25% with severe LV dilation, severe RV dilation and dysfunction. Echo 04/2016: EF 40-45%, RV normal Echo 04/2018: EF 10-15%, RV mod reduced  R/LHC 04/2015: 1. No significant coronary artery disease.   2. Near-normal left and right heart filling pressures. 3. Preserved cardiac output.  4. Mild pulmonary hypertension, suspect combination  pulmonary venous hypertension and OSA.  RA mean 6 RV 38/7 PA 42/21, mean 33 PCWP mean 14 LV 104/11 AO 102/67 Oxygen saturations: PA 73% AO 95% Cardiac Output (Fick) 11.2  Cardiac Index (Fick) 5.1 Cardiac Output (Thermodilution) 7.84 Cardiac Index (Thermodilution) 3.56 PVR 2.84  Review of Systems: [y] = yes, [ ]  = no    General: Weight gain [ y]; Weight loss [ ] ; Anorexia [ ] ; Fatigue [ y]; Fever [ ] ; Chills [ ] ; Weakness Blue.Reese ]   Cardiac: Chest  pain/pressure [ ] ; Resting SOB [ ] ; Exertional SOB [ y]; Orthopnea [ ] ; Pedal Edema [ y]; Palpitations [ ] ; Syncope [ ] ; Presyncope Blue.Reese ]; Paroxysmal nocturnal dyspnea[ ]    Pulmonary: Cough [ ] ; Wheezing[ ] ; Hemoptysis[ ] ; Sputum [ ] ; Snoring [ ]    GI: Vomiting[ ] ; Dysphagia[ ] ; Melena[ ] ; Hematochezia [ ] ; Heartburn[ ] ; Abdominal pain [ ] ; Constipation [ ] ; Diarrhea [ ] ; BRBPR [ ]    GU: Hematuria[ ] ; Dysuria [ ] ; Nocturia[ ]    Vascular: Pain in legs with walking [ ] ; Pain in feet with lying flat [ ] ; Non-healing sores [ ] ; Stroke [ ] ; TIA [ ] ; Slurred speech [ ] ;   Neuro: Headaches[ ] ; Vertigo[ ] ; Seizures[ ] ; Paresthesias[ ] ;Blurred vision [ ] ; Diplopia [ ] ; Vision changes [ ]    Ortho/Skin: Arthritis [ y]; Joint pain [ ] y; Muscle pain [ ] ; Joint swelling [ ] ; Back Pain [ ] ; Rash [ ]    Psych: Depression[ ] ; Anxiety[ ]    Heme: Bleeding problems [ ] ; Clotting disorders [ ] ; Anemia [ ]    Endocrine: Diabetes [ ] ; Thyroid dysfunction[ ]   Home Medications Prior to Admission medications   Medication Sig Start Date End Date Taking? Authorizing Provider  acetaminophen (TYLENOL) 500 MG tablet You can take 1000 mg every 8 hours as needed for pain.  Can alternate this with ibuprofen, or Tramadol.  Do not exceed 4000 mg of Tylenol(acetaminophen) per day it can harm your liver.  You can buy this over-the-counter at any drugstore. 03/28/18   Earnstine Regal, PA-C  docusate sodium (COLACE) 100 MG capsule Take 1 capsule (100 mg total) by mouth 2 (two) times daily. 03/20/18   Bonnielee Haff, MD  folic acid (FOLVITE) 1 MG tablet Take 1 tablet (1 mg total) by mouth daily. 03/20/18   Bonnielee Haff, MD  ibuprofen (ADVIL,MOTRIN) 200 MG tablet You can take 3 tablets every 6 hours as needed for pain.  You can alternate this with plain Tylenol or Tramadol.  You can buy this over-the-counter at any drugstore without a prescription. 03/28/18   Earnstine Regal, PA-C  losartan (COZAAR) 25 MG tablet Take 0.5  tablets (12.5 mg total) by mouth at bedtime. 04/10/18   Georgiana Shore, NP  magnesium oxide (MAG-OX) 400 MG tablet Take 1 tablet (400 mg total) by mouth daily. 04/11/18   Georgiana Shore, NP  metolazone (ZAROXOLYN) 2.5 MG tablet Take 1 tablet (2.5 mg total) by mouth daily. As directed by heart failure clinic 06/05/18 09/03/18  Shirley Friar, PA-C  Multiple Vitamin (MULTIVITAMIN WITH MINERALS) TABS tablet Take 1 tablet by mouth daily. 03/20/18   Bonnielee Haff, MD  polyethylene glycol [facility] / Floria Raveling) packet You can use this for constipation as instructed on the package directions.  You can buy this at any drugstore without a prescription. 03/28/18   Earnstine Regal, PA-C  potassium chloride 20 MEQ TBCR Take 20 mEq by mouth 2 (two) times daily. 06/05/18   Barrington Ellison  Mitzi Hansen, PA-C  torsemide (DEMADEX) 20 MG tablet Take 2 tablets (40 mg total) by mouth 2 (two) times daily. 06/05/18   Shirley Friar, PA-C  traMADol (ULTRAM) 50 MG tablet Take 1 tablet (50 mg total) by mouth every 6 (six) hours as needed for moderate pain (Third line use if pain not relieved by PO Tylenol and Ibuprofen). 03/28/18   Earnstine Regal, PA-C    Past Medical History: Past Medical History:  Diagnosis Date   CHF (congestive heart failure) ([place])    Hypertension     Past Surgical History: Past Surgical History:  Procedure Laterality Date   CARDIAC CATHETERIZATION N/A 04/30/2015   Procedure: Right/Left Heart Cath and Coronary Angiography;  Surgeon: Larey Dresser, MD;  Location: [place];  Service: Cardiovascular;  Laterality: N/A;   COLONOSCOPY WITH PROPOFOL N/A 05/01/2015   Procedure: COLONOSCOPY WITH PROPOFOL;  Surgeon: Wilford Corner, MD;  Location: [facility] END[facility];  Service: Endoscopy;  Laterality: N/A;   ESOPHAGOGASTRODUODENOSCOPY (EGD) WITH PROPOFOL N/A 05/01/2015   Procedure: ESOPHAGOGASTRODUODENOSCOPY (EGD) WITH PROPOFOL;  Surgeon: Wilford Corner, MD;  Location: [facility]  ENDOSCOPY;  Service: Endoscopy;  Laterality: N/A;   INGUINAL HERNIA REPAIR Left 03/24/2018   Procedure: HERNIA REPAIR INGUINAL INCARCERATED;  Surgeon: Georganna Skeans, MD;  Location: [facility];  Service: General;  Laterality: Left;   INSERTION OF MESH Left 03/24/2018   Procedure: INSERTION OF MESH;  Surgeon: Georganna Skeans, MD;  Location: [facility];  Service: General;  Laterality: Left;   SHOULDER SURGERY      Family History: Family History  Problem Relation Age of Onset   Lung cancer Mother     Social History: Social History   Socioeconomic History   Marital status: Single    Spouse name: Not on file   Number of children: Not on file   Years of education: Not on file   Highest education level: Not on file  Occupational History   Not on file  Social Needs   Financial resource strain: Not on file   Food insecurity:    Worry: Not on file    Inability: Not on file   Transportation needs:    Medical: Not on file    Non-medical: Not on file  Tobacco Use   Smoking status: Never Smoker   Smokeless tobacco: Never Used  Substance and Sexual Activity   Alcohol use: No   Drug use: No   Sexual activity: Not on file  Lifestyle   Physical activity:    Days per week: Not on file    Minutes per session: Not on file   Stress: Not on file  Relationships   Social connections:    Talks on phone: Not on file    Gets together: Not on file    Attends religious service: Not on file    Active member of club or organization: Not on file    Attends meetings of clubs or organizations: Not on file    Relationship status: Not on file  Other Topics Concern   Not on file  Social History Narrative   Not on file    Allergies:  Allergies  Allergen Reactions   Lisinopril Swelling    REACTION: pt had a swelling episode.  His lips swelled   Penicillins Hives    Has patient had a PCN reaction causing immediate rash, facial/tongue/throat swelling, SOB or lightheadedness with  hypotension: Yes Has patient had a PCN reaction causing severe rash involving mucus membranes or skin necrosis: No Has patient  had a PCN reaction that required hospitalization: No Has patient had a PCN reaction occurring within the last 10 years: No If all of the above answers are "NO", then may proceed with Cephalosporin use.    Objective:    Vital Signs:   Temp:  [86.9 F (30.5 C)] 86.9 F (30.5 C) (04/02 1339) Resp:  [15-22] 22 (04/02 1445) BP: (63-84)/(32-65) 63/32 (04/02 1445) Weight:  [458 kg] 112 kg (04/02 1312)    Weight change: Filed Weights   08/16/18 1312  Weight: 112 kg    Intake/Output:  No intake or output data in the 24 hours ending 08/16/18 1456    Physical Exam    General:  Critically ill appearing. No resp difficulty HEENT: normal Neck: supple. JVP to ear . Carotids 2+ bilat; no bruits. No lymphadenopathy or thyromegaly appreciated. Cor: PMI laterally displaced. Regular rate & rhythm. + s3 Lungs: clear Abdomen: soft, nontender, nondistended. No hepatosplenomegaly. No bruits or masses. Good bowel sounds. Extremities: no cyanosis, clubbing, rash, BLE 3-4+ edema, cold Neuro: alert & orientedx3, cranial nerves grossly intact. moves all 4 extremities w/o difficulty. Affect pleasant   Telemetry   Sinus 60s low volts Personally reviewed   EKG    EKG: NSR with very low volts 50-60s). Personally reviewed.   Labs   Basic Metabolic Panel: No results for input(s): NA, K, CL, CO2, GLUCOSE, BUN, CREATININE, CALCIUM, MG, PHOS in the last 168 hours.  Liver Function Tests: No results for input(s): AST, ALT, ALKPHOS, BILITOT, PROT, ALBUMIN in the last 168 hours. No results for input(s): LIPASE, AMYLASE in the last 168 hours. Recent Labs  Lab 08/16/18 1333  AMMONIA 20    CBC: No results for input(s): WBC, NEUTROABS, HGB, HCT, MCV, PLT in the last 168 hours.  Cardiac Enzymes: No results for input(s): CKTOTAL, CKMB, CKMBINDEX, TROPONINI in the last 168  hours.  BNP: BNP (last 3 results) Recent Labs    03/10/18 2026 03/24/18 1435 06/05/18 1411  BNP 2,304.5* 2,471.9* 3,961.4*    ProBNP (last 3 results) No results for input(s): PROBNP in the last 8760 hours.   CBG: Recent Labs  Lab 08/16/18 1301 08/16/18 1346 08/16/18 1416  GLUCAP 67* 30* 88    Coagulation Studies: No results for input(s): LABPROT, INR in the last 72 hours.   Imaging   Dg Chest Portable 1 View  Result Date: 08/16/2018 CLINICAL DATA:  Initial evaluation for acute shortness of breath. EXAM: PORTABLE CHEST 1 VIEW COMPARISON:  Prior radiograph from 03/24/2018. FINDINGS: Advanced cardiomegaly, similar to previous. Mediastinal silhouette within normal limits. Lungs mildly hypoinflated. Diffuse pulmonary vascular congestion with interstitial prominence, suggesting mild pulmonary interstitial congestion/edema. No definite pleural effusion. No consolidative opacity. No pneumothorax. No acute osseous finding. Prominent degenerative changes about the shoulders bilaterally. IMPRESSION: 1. Cardiomegaly with mild diffuse pulmonary interstitial congestion/edema. 2. No other active cardiopulmonary disease. Electronically Signed   By: Jeannine Boga M.D.   On: 08/16/2018 13:50   Dg Abd Portable 1 View  Result Date: 08/16/2018 CLINICAL DATA:  Initial evaluation for acute abdominal distension. EXAM: PORTABLE ABDOMEN - 1 VIEW COMPARISON:  Prior radiograph from 03/24/2018 FINDINGS: Visualized bowel gas pattern within normal limits without obstruction or ileus. Mild gaseous distension of the stomach. No abnormal bowel wall thickening. No appreciable free air on this limited single supine partial view of the abdomen. No soft tissue mass or abnormal calcification. Visualized osseous structures demonstrate no acute finding. Osteoarthritic changes about the hips bilaterally. Degenerative changes noted within the lower lumbar  spine. IMPRESSION: Nonobstructive bowel gas pattern with no  radiographic evidence for acute intra-abdominal pathology. Electronically Signed   By: Jeannine Boga M.D.   On: 08/16/2018 13:54      Medications:     Current Medications:  famotidine (PEPCID) IV  20 mg Intravenous Q12H   heparin  5,000 Units Subcutaneous Q8H   hydrocortisone sodium succinate  50 mg Intravenous Q6H     Infusions:  sodium chloride     dextrose     dextrose 5 % and 0.45% NaCl 75 mL/hr at 08/16/18 1407   levofloxacin (LEVAQUIN) IV 750 mg (08/16/18 1357)   norepinephrine (LEVOPHED) Adult infusion     vancomycin 2,000 mg (08/16/18 1409)       Patient Profile   Kyle Conrad is a 53 y.o. 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  Admitted with cardiogenic shock and 80 lb weight gain after being found confused on the floor by his neighbor.   Assessment/Plan   1. Acute on chronic biventricular systolic CHF R>>L (end-stage): NICM. -> Cardiogenic shock with multisystem organ failure - Echo 03/11/18: EF 20-25% with severe LV dilation, severe RV dilation and dysfunction. - Volume markedly overloaded with low output/shock. He is up 80 lbs from DC weight in October.  - Titrate NE to SBP 90. Add dobutamine as needed - Will place central access for pressors and to check co-ox & CVP  - Hold spiro and losartan with hypotension - No BB with overload/shock - On Vanc/Levaquin to cover for possible sepsis but doubt sepsis. Can stop when cultures back - Continue stress dose steroids - Will do bedside echo to look for effusion - Prognosis very poor. - Not canddiate for advanced therapies due to noncompliance - D/w Dr. Nelda Marseille from CCM  2. Hypothermia  - Temp 86.9 rectally. Likely secondary to #1. On bear hugger. - Continue stress-dose steroids  3. Hypoglycemia - On D10 drip per CCM. LFTs pending.   4. AKI - due to low-output/cardiorenal syndrome  5. AMS - Ammonia normal. Sugar has been  low. Found on floor after presumable fall.   - Low output probably also contributing.   6. Cirrhosis: Liver with cirrhotic appearance on abdominal US. 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 LFTs pending.  7. Hypothyroid - TSH elevated. Add T3 and T4  8. Noncompliance:  - Noncompliant with paramedicine, follow up, and medications  CRITICAL CARE Performed by: Glori Bickers  Total critical care time: 55 minutes  Critical care time was exclusive of separately billable procedures and treating other patients.  Critical care was necessary to treat or prevent imminent or life-threatening deterioration.  Critical care was time spent personally by me (independent of midlevel providers or residents) on the following activities: development of treatment plan with patient and/or surrogate as well as nursing, discussions with consultants, evaluation of patient's response to treatment, examination of patient, obtaining history from patient or surrogate, ordering and performing treatments and interventions, ordering and review of laboratory studies, ordering and review of radiographic studies, pulse oximetry and re-evaluation of patient's condition.   Length of Stay: 0  Glori Bickers, MD  3:54 PM   Advanced Heart Failure Team Pager [phone] (M-F; 7a - 4p)  Please contact [facility] for night-coverage after hours (4p -7a ) and weekends on [email or web address]

## 2018-08-16 NOTE — Progress Notes (Signed)
Hypoglycemic Event  CBG: 57; is currently on D10 gtt at 72mL/hr. Discussed with Dr. Haroldine Laws who instructed RN to continue monitoring CBG and only give D50 if the pt has changes in mental status. Pt is currently alert and oriented, interacting with nursing staff appropriately. Will continue to monitor closely.

## 2018-08-16 NOTE — ED Triage Notes (Signed)
Pt brought in by [facility] from. Per EMS, pt neighbor went to check on him, found him on the floor. Pt CBG 41 on EMS arrival. Pt alert and oriented, lives alone. Pt has EF of 10%, hx of CHF. Pt has pitting edema all over, skin breakdown noted to pt feet, groin, back, legs. Pt extremities cold to the touch.

## 2018-08-16 NOTE — ED Notes (Signed)
[employer] producer at bedside

## 2018-08-16 NOTE — CV Procedure (Signed)
Central Venous Catheter Insertion Procedure Note Kyle Conrad [id number] [birth year]    Procedure: Insertion of Central Venous Catheter Indications: Drug and/or fluid administration   Procedure Details Consent: Risks of procedure as well as the alternatives and risks of each were explained to the (patient/caregiver).  Consent for procedure obtained. Time Out: Verified patient identification, verified procedure, site/side was marked, verified correct patient position, special equipment/implants available, medications/allergies/relevent history reviewed, required imaging and test results available.  Performed   Maximum sterile technique was used including antiseptics, cap, gloves, gown, hand hygiene, mask and sheet. Skin prep: Chlorhexidine; local anesthetic administered A antimicrobial bonded/coated triple lumen catheter was placed in the right subclavian vein using the Seldinger technique.   Evaluation Blood flow good Complications: No apparent complications Patient did tolerate procedure well. Chest X-ray ordered to verify placement.  CXR: OK   Glori Bickers, MD  5:51 PM

## 2018-08-17 ENCOUNTER — Inpatient Hospital Stay (HOSPITAL_COMMUNITY): Payer: Medicaid Other

## 2018-08-17 DIAGNOSIS — I340 Nonrheumatic mitral (valve) insufficiency: Secondary | ICD-10-CM

## 2018-08-17 DIAGNOSIS — I361 Nonrheumatic tricuspid (valve) insufficiency: Secondary | ICD-10-CM

## 2018-08-17 LAB — POCT I-STAT 7, (LYTES, BLD GAS, ICA,H+H)
Acid-base deficit: 7 mmol/L — ABNORMAL HIGH (ref 0.0–2.0)
Bicarbonate: 16.7 mmol/L — ABNORMAL LOW (ref 20.0–28.0)
Calcium, Ion: 1.21 mmol/L (ref 1.15–1.40)
HCT: 36 % — ABNORMAL LOW (ref 39.0–52.0)
Hemoglobin: 12.2 g/dL — ABNORMAL LOW (ref 13.0–17.0)
O2 Saturation: 96 %
Patient temperature: 37.2
Potassium: 4.6 mmol/L (ref 3.5–5.1)
Sodium: 143 mmol/L (ref 135–145)
TCO2: 18 mmol/L — ABNORMAL LOW (ref 22–32)
pCO2 arterial: 28.1 mmHg — ABNORMAL LOW (ref 32.0–48.0)
pH, Arterial: 7.383 (ref 7.350–7.450)
pO2, Arterial: 79 mmHg — ABNORMAL LOW (ref 83.0–108.0)

## 2018-08-17 LAB — GLUCOSE, CAPILLARY
Glucose-Capillary: 70 mg/dL (ref 70–99)
Glucose-Capillary: 41 mg/dL — CL (ref 70–99)
Glucose-Capillary: 68 mg/dL — ABNORMAL LOW (ref 70–99)
Glucose-Capillary: 78 mg/dL (ref 70–99)
Glucose-Capillary: 85 mg/dL (ref 70–99)
Glucose-Capillary: 87 mg/dL (ref 70–99)
Glucose-Capillary: 87 mg/dL (ref 70–99)
Glucose-Capillary: 92 mg/dL (ref 70–99)
Glucose-Capillary: 102 mg/dL — ABNORMAL HIGH (ref 70–99)
Glucose-Capillary: 109 mg/dL — ABNORMAL HIGH (ref 70–99)
Glucose-Capillary: 135 mg/dL — ABNORMAL HIGH (ref 70–99)
Glucose-Capillary: 148 mg/dL — ABNORMAL HIGH (ref 70–99)
Glucose-Capillary: 148 mg/dL — ABNORMAL HIGH (ref 70–99)
Glucose-Capillary: 135 mg/dL — ABNORMAL HIGH (ref 70–99)
Glucose-Capillary: 128 mg/dL — ABNORMAL HIGH (ref 70–99)
Glucose-Capillary: 121 mg/dL — ABNORMAL HIGH (ref 70–99)
Glucose-Capillary: 118 mg/dL — ABNORMAL HIGH (ref 70–99)
Glucose-Capillary: 133 mg/dL — ABNORMAL HIGH (ref 70–99)

## 2018-08-17 LAB — BASIC METABOLIC PANEL
Anion gap: 11 (ref 5–15)
Anion gap: 8 (ref 5–15)
BUN: 49 mg/dL — ABNORMAL HIGH (ref 6–20)
BUN: 53 mg/dL — ABNORMAL HIGH (ref 6–20)
CO2: 17 mmol/L — ABNORMAL LOW (ref 22–32)
CO2: 19 mmol/L — ABNORMAL LOW (ref 22–32)
Calcium: 8.2 mg/dL — ABNORMAL LOW (ref 8.9–10.3)
Calcium: 7.6 mg/dL — ABNORMAL LOW (ref 8.9–10.3)
Chloride: 112 mmol/L — ABNORMAL HIGH (ref 98–111)
Chloride: 111 mmol/L (ref 98–111)
Creatinine, Ser: 2.15 mg/dL — ABNORMAL HIGH (ref 0.61–1.24)
Creatinine, Ser: 2.23 mg/dL — ABNORMAL HIGH (ref 0.61–1.24)
GFR calc Af Amer: 39 mL/min — ABNORMAL LOW (ref >60)
GFR calc Af Amer: 37 mL/min — ABNORMAL LOW (ref >60)
GFR calc non Af Amer: 34 mL/min — ABNORMAL LOW (ref >60)
GFR calc non Af Amer: 32 mL/min — ABNORMAL LOW (ref >60)
Glucose, Bld: 75 mg/dL (ref 70–99)
Glucose, Bld: 143 mg/dL — ABNORMAL HIGH (ref 70–99)
Potassium: 4.4 mmol/L (ref 3.5–5.1)
Potassium: 4.5 mmol/L (ref 3.5–5.1)
Sodium: 140 mmol/L (ref 135–145)
Sodium: 138 mmol/L (ref 135–145)

## 2018-08-17 LAB — BLOOD CULTURE ID PANEL (REFLEXED)

## 2018-08-17 LAB — ECHOCARDIOGRAM COMPLETE
Height: 72 in
Weight: 3686.09 oz

## 2018-08-17 LAB — RENAL FUNCTION PANEL
Albumin: 1.4 g/dL — ABNORMAL LOW (ref 3.5–5.0)
Anion gap: 10 (ref 5–15)
BUN: 48 mg/dL — ABNORMAL HIGH (ref 6–20)
CO2: 19 mmol/L — ABNORMAL LOW (ref 22–32)
Calcium: 7.8 mg/dL — ABNORMAL LOW (ref 8.9–10.3)
Chloride: 110 mmol/L (ref 98–111)
Creatinine, Ser: 1.99 mg/dL — ABNORMAL HIGH (ref 0.61–1.24)
GFR calc Af Amer: 43 mL/min — ABNORMAL LOW (ref >60)
GFR calc non Af Amer: 37 mL/min — ABNORMAL LOW (ref >60)
Glucose, Bld: 146 mg/dL — ABNORMAL HIGH (ref 70–99)
Phosphorus: 4.3 mg/dL (ref 2.5–4.6)
Potassium: 4.2 mmol/L (ref 3.5–5.1)
Sodium: 139 mmol/L (ref 135–145)

## 2018-08-17 LAB — CBC
HCT: 34.5 % — ABNORMAL LOW (ref 39.0–52.0)
Hemoglobin: 10.8 g/dL — ABNORMAL LOW (ref 13.0–17.0)
MCH: 28.5 pg (ref 26.0–34.0)
MCHC: 31.3 g/dL (ref 30.0–36.0)
MCV: 91 fL (ref 80.0–100.0)
Platelets: 44 10*3/uL — ABNORMAL LOW (ref 150–400)
RBC: 3.79 MIL/uL — ABNORMAL LOW (ref 4.22–5.81)
RDW: 17.8 % — ABNORMAL HIGH (ref 11.5–15.5)
WBC: 4.9 10*3/uL (ref 4.0–10.5)
nRBC: 0.6 % — ABNORMAL HIGH (ref 0.0–0.2)

## 2018-08-17 LAB — URINALYSIS, ROUTINE W REFLEX MICROSCOPIC
Bilirubin Urine: NEGATIVE
Glucose, UA: NEGATIVE mg/dL
Ketones, ur: NEGATIVE mg/dL
Leukocytes,Ua: NEGATIVE
Nitrite: NEGATIVE
Protein, ur: 30 mg/dL — AB
Specific Gravity, Urine: 1.02 (ref 1.005–1.030)
pH: 5 (ref 5.0–8.0)

## 2018-08-17 LAB — CREATININE, URINE, RANDOM: Creatinine, Urine: 18.27 mg/dL

## 2018-08-17 LAB — COOXEMETRY PANEL
Carboxyhemoglobin: 1.4 % (ref 0.5–1.5)
Methemoglobin: 1.2 % (ref 0.0–1.5)
O2 Saturation: 82.6 %
Total hemoglobin: 10.9 g/dL — ABNORMAL LOW (ref 12.0–16.0)

## 2018-08-17 LAB — T3, FREE: T3, Free: 1.2 pg/mL — ABNORMAL LOW (ref 2.0–4.4)

## 2018-08-17 LAB — PROTIME-INR
INR: 3.8 — ABNORMAL HIGH (ref 0.8–1.2)
Prothrombin Time: 36.8 seconds — ABNORMAL HIGH (ref 11.4–15.2)

## 2018-08-17 LAB — PATHOLOGIST SMEAR REVIEW

## 2018-08-17 LAB — SODIUM, URINE, RANDOM: Sodium, Ur: 76 mmol/L

## 2018-08-17 LAB — URIC ACID: Uric Acid, Serum: 11.2 mg/dL — ABNORMAL HIGH (ref 3.7–8.6)

## 2018-08-17 LAB — GLUCOSE, RANDOM: Glucose, Bld: 36 mg/dL — CL (ref 70–99)

## 2018-08-17 LAB — PHOSPHORUS: Phosphorus: 3.8 mg/dL (ref 2.5–4.6)

## 2018-08-17 LAB — AMMONIA: Ammonia: 35 umol/L (ref 9–35)

## 2018-08-17 LAB — MAGNESIUM: Magnesium: 1.8 mg/dL (ref 1.7–2.4)

## 2018-08-17 LAB — STREP PNEUMONIAE URINARY ANTIGEN: Strep Pneumo Urinary Antigen: NEGATIVE

## 2018-08-17 MED ORDER — PRISMASOL BGK 4/2.5 32-4-2.5 MEQ/L IV SOLN
INTRAVENOUS | Status: DC
  Administered 2018-08-17 – 2018-08-18 (×5): via INTRAVENOUS_CENTRAL
  Administered 2018-08-18: 2000 mL via INTRAVENOUS_CENTRAL
  Administered 2018-08-18: 10:00:00 2000 mL/h via INTRAVENOUS_CENTRAL
  Administered 2018-08-18 (×2): via INTRAVENOUS_CENTRAL
  Administered 2018-08-18: 15:00:00 2000 mL/h via INTRAVENOUS_CENTRAL
  Administered 2018-08-18 (×2): via INTRAVENOUS_CENTRAL
  Administered 2018-08-19: 07:00:00 2000 mL/h via INTRAVENOUS_CENTRAL
  Administered 2018-08-19: 23:00:00 via INTRAVENOUS_CENTRAL
  Administered 2018-08-19 (×2): 2000 mL/h via INTRAVENOUS_CENTRAL
  Administered 2018-08-19 (×3): via INTRAVENOUS_CENTRAL
  Administered 2018-08-19 (×2): 2000 mL/h via INTRAVENOUS_CENTRAL
  Administered 2018-08-20 – 2018-08-22 (×19): via INTRAVENOUS_CENTRAL
  Filled 2018-08-17 (×65): qty 5000

## 2018-08-17 MED ORDER — DOBUTAMINE IN D5W 4-5 MG/ML-% IV SOLN
4.0000 ug/kg/min | INTRAVENOUS | Status: DC
  Administered 2018-08-17: 2.5 ug/kg/min via INTRAVENOUS
  Administered 2018-08-19 – 2018-08-21 (×2): 4 ug/kg/min via INTRAVENOUS
  Filled 2018-08-17 (×4): qty 250

## 2018-08-17 MED ORDER — DEXTROSE 5 % IV SOLN
500.0000 mg | Freq: Once | INTRAVENOUS | Status: AC
  Administered 2018-08-17: 16:00:00 500 mg via INTRAVENOUS
  Filled 2018-08-17: qty 500

## 2018-08-17 MED ORDER — SODIUM CHLORIDE 0.9% FLUSH: 10.0000 mL | INTRAVENOUS | Status: DC | PRN

## 2018-08-17 MED ORDER — MAGNESIUM SULFATE 2 GM/50ML IV SOLN
2.0000 g | Freq: Once | INTRAVENOUS | Status: AC
  Administered 2018-08-17: 2 g via INTRAVENOUS
  Filled 2018-08-17: qty 50

## 2018-08-17 MED ORDER — CHLORHEXIDINE GLUCONATE 0.12 % MT SOLN
15.0000 mL | Freq: Two times a day (BID) | OROMUCOSAL | Status: DC
  Administered 2018-08-17 – 2018-08-30 (×25): 15 mL via OROMUCOSAL
  Filled 2018-08-17 (×23): qty 15

## 2018-08-17 MED ORDER — FUROSEMIDE 10 MG/ML IJ SOLN
160.0000 mg | Freq: Four times a day (QID) | INTRAVENOUS | Status: DC
  Administered 2018-08-17 – 2018-08-18 (×4): 160 mg via INTRAVENOUS
  Filled 2018-08-17: qty 10
  Filled 2018-08-17 (×2): qty 16
  Filled 2018-08-17: qty 10
  Filled 2018-08-17 (×4): qty 16

## 2018-08-17 MED ORDER — ORAL CARE MOUTH RINSE
15.0000 mL | Freq: Two times a day (BID) | OROMUCOSAL | Status: DC
  Administered 2018-08-17 – 2018-09-07 (×28): 15 mL via OROMUCOSAL

## 2018-08-17 MED ORDER — DEXTROSE 50 % IV SOLN
1.0000 | Freq: Once | INTRAVENOUS | Status: AC
  Administered 2018-08-17: 50 mL via INTRAVENOUS
  Filled 2018-08-17: qty 50

## 2018-08-17 MED ORDER — GLUCAGON HCL RDNA (DIAGNOSTIC) 1 MG IJ SOLR
1.0000 mg | Freq: Once | INTRAMUSCULAR | Status: AC
  Administered 2018-08-17: 02:00:00 1 mg via INTRAVENOUS
  Filled 2018-08-17: qty 1

## 2018-08-17 MED ORDER — PRISMASOL BGK 4/2.5 32-4-2.5 MEQ/L REPLACEMENT SOLN
Status: DC
  Administered 2018-08-17: 15:00:00 via INTRAVENOUS_CENTRAL
  Administered 2018-08-18: 500 mL via INTRAVENOUS_CENTRAL
  Administered 2018-08-18 – 2018-08-19 (×2): via INTRAVENOUS_CENTRAL
  Administered 2018-08-19: 500 mL via INTRAVENOUS_CENTRAL
  Administered 2018-08-19: 01:00:00 via INTRAVENOUS_CENTRAL
  Administered 2018-08-19: 500 mL via INTRAVENOUS_CENTRAL
  Administered 2018-08-20 – 2018-08-21 (×5): via INTRAVENOUS_CENTRAL
  Filled 2018-08-17 (×19): qty 5000

## 2018-08-17 MED ORDER — FAMOTIDINE 20 MG IN NS 100 ML IVPB: 20.0000 mg | INTRAVENOUS | Status: DC

## 2018-08-17 MED ORDER — CHLORHEXIDINE GLUCONATE CLOTH 2 % EX PADS
6.0000 | MEDICATED_PAD | Freq: Every day | CUTANEOUS | Status: DC
  Administered 2018-08-17 – 2018-09-08 (×22): 6 via TOPICAL

## 2018-08-17 MED ORDER — SODIUM CHLORIDE 0.9 % IV SOLN
INTRAVENOUS | Status: DC | PRN
  Administered 2018-08-19: 10 mL/h via INTRA_ARTERIAL
  Administered 2018-08-30: 16:00:00 via INTRA_ARTERIAL

## 2018-08-17 MED ORDER — EMPTY CONTAINERS FLEXIBLE MISC
Status: DC
  Administered 2018-08-17: 09:00:00 via INTRAVENOUS
  Filled 2018-08-17: qty 250

## 2018-08-17 MED ORDER — SODIUM CHLORIDE 0.9 % IV SOLN
2.0000 g | Freq: Two times a day (BID) | INTRAVENOUS | Status: DC
  Administered 2018-08-17 – 2018-08-19 (×5): 2 g via INTRAVENOUS
  Filled 2018-08-17 (×7): qty 2

## 2018-08-17 MED ORDER — FUROSEMIDE 10 MG/ML IJ SOLN
20.0000 mg/h | INTRAVENOUS | Status: DC
  Administered 2018-08-17: 09:00:00 20 mg/h via INTRAVENOUS
  Filled 2018-08-17 (×2): qty 25

## 2018-08-17 MED ORDER — EMPTY CONTAINERS FLEXIBLE MISC
Status: AC
  Administered 2018-08-17: 02:00:00 via INTRAVENOUS
  Filled 2018-08-17: qty 250

## 2018-08-17 MED ORDER — SODIUM CHLORIDE 0.9% FLUSH
10.0000 mL | Freq: Two times a day (BID) | INTRAVENOUS | Status: DC
  Administered 2018-08-17: 20 mL
  Administered 2018-08-17 – 2018-08-19 (×4): 10 mL
  Administered 2018-08-20: 30 mL
  Administered 2018-08-20 – 2018-08-24 (×7): 10 mL
  Administered 2018-08-25: 10:00:00 30 mL
  Administered 2018-08-25 – 2018-09-20 (×39): 10 mL

## 2018-08-17 MED ORDER — SODIUM CHLORIDE 0.9 % FOR CRRT
INTRAVENOUS_CENTRAL | Status: DC | PRN
  Filled 2018-08-17: qty 1000

## 2018-08-17 MED ORDER — FUROSEMIDE 10 MG/ML IJ SOLN
80.0000 mg | Freq: Once | INTRAMUSCULAR | Status: AC
  Administered 2018-08-17: 80 mg via INTRAVENOUS

## 2018-08-17 MED ORDER — EMPTY CONTAINERS FLEXIBLE MISC
Status: AC
  Administered 2018-08-17: 15:00:00 via INTRAVENOUS
  Filled 2018-08-17: qty 500

## 2018-08-17 MED ORDER — DEXTROSE 50 % IV SOLN
1.0000 | Freq: Once | INTRAVENOUS | Status: AC
  Administered 2018-08-17: 01:00:00 50 mL via INTRAVENOUS

## 2018-08-17 MED ORDER — DEXTROSE 50 % IV SOLN
INTRAVENOUS | Status: AC
  Administered 2018-08-17: 50 mL via INTRAVENOUS
  Filled 2018-08-17: qty 50

## 2018-08-17 MED ORDER — BLISTEX MEDICATED EX OINT
TOPICAL_OINTMENT | CUTANEOUS | Status: DC | PRN
  Filled 2018-08-17: qty 6.3

## 2018-08-17 MED ORDER — PRISMASOL BGK 4/2.5 32-4-2.5 MEQ/L REPLACEMENT SOLN
Status: DC
  Administered 2018-08-17: 15:00:00 via INTRAVENOUS_CENTRAL
  Administered 2018-08-18: 15:00:00 500 mL via INTRAVENOUS_CENTRAL
  Administered 2018-08-18 – 2018-08-22 (×9): via INTRAVENOUS_CENTRAL
  Filled 2018-08-17 (×15): qty 5000

## 2018-08-17 MED ORDER — METOLAZONE 5 MG PO TABS
10.0000 mg | ORAL_TABLET | Freq: Once | ORAL | Status: DC
  Filled 2018-08-17: qty 2

## 2018-08-17 NOTE — Progress Notes (Signed)
Pharmacy Antibiotic Note  Kyle Conrad is a 53 y.o. male admitted on 08/16/2018 with sepsis.  Pharmacy originally consulted for Vancomycin and Levaquin dosing. SCr 1.9 (BL<1), CrCl ~ 57 ml/min.  Pt with profound hypoglycemia, Levaquin has been associated with this in a few case reports although timing usually ~24hours post Levaquin start. Blood cultures now with 2/4 enterobacteriaceae but no species on BCID panel. Spoke with Dr. Karl Luke and will change Levaquin to Cefepime (just in case Levaquin contributing to hypoglycemia).  Plan: Continue Vancomycin 1250mg  IV every 24 hours (nomogram dosing d/t AKI) Cefepime 2gm IV q12h Monitor renal function, Cx and clinical progression to narrow Vancomycin levels at steady state  Height: 6' (182.9 cm) Weight: 199 lb 4.7 oz (90.4 kg) IBW/kg (Calculated) : 77.6  Temp (24hrs), Avg:96 F (35.6 C), Min:86.9 F (30.5 C), Max:100.2 F (37.9 C)  Recent Labs  Lab 08/16/18 1333 08/16/18 1611 08/16/18 1645 08/16/18 2227  WBC 4.3 2.2*  --   --   CREATININE 1.90* 1.83*  --   --   LATICACIDVEN 3.1*  --  4.3* 4.9*    Estimated Creatinine Clearance: 50.6 mL/min (A) (by C-G formula based on SCr of 1.83 mg/dL (H)).    Allergies  Allergen Reactions  . Lisinopril Swelling    REACTION: pt had a swelling episode.  His lips swelled  . Penicillins Hives    Has patient had a PCN reaction causing immediate rash, facial/tongue/throat swelling, SOB or lightheadedness with hypotension: Yes Has patient had a PCN reaction causing severe rash involving mucus membranes or skin necrosis: No Has patient had a PCN reaction that required hospitalization: No Has patient had a PCN reaction occurring within the last 10 years: No If all of the above answers are "NO", then may proceed with Cephalosporin use.    Antimicrobials this admission: Vanc 4/2>> Levaquin 4/2 x1 Cefepime 4/3 >>  Dose adjustments this admission: n/a  Microbiology results: 4/2 BCx: 2/4 BCID  enterobacteriaceae - no species ID'd on panel 4/2 UCx: sent 4/2 Trach aspirate: sent 4/2 MRSA PCR: negative  Sherlon Handing, PharmD, BCPS Clinical pharmacist  **Pharmacist phone directory can now be found on [email or web address] ([email or web address]).  Listed under [place]. 08/17/2018 3:20 AM    PHARMACY - PHYSICIAN COMMUNICATION CRITICAL VALUE ALERT - BLOOD CULTURE IDENTIFICATION (BCID)  Kyle Conrad is an 53 y.o. male who presented to [facility] on 08/16/2018 with a chief complaint of sepsis  Assessment:  BCID with 2/4 enterobacteriaceae -no species identified on BCID panel  Name of physician (or Provider) Contacted: Dr. Karl Luke  Current antibiotics: Vancomycin and Levaquin  Changes to prescribed antibiotics recommended:  Levaquin changed to Cefepime (see note above). MD to consider d/c Vancomycin soon.  Results for orders placed or performed during the hospital encounter of 08/16/18  Blood Culture ID Panel (Reflexed) (Collected: 08/16/2018  1:33 PM)  Result Value Ref Range   Enterococcus species NOT DETECTED NOT DETECTED   Listeria monocytogenes NOT DETECTED NOT DETECTED   Staphylococcus species NOT DETECTED NOT DETECTED   Staphylococcus aureus (BCID) NOT DETECTED NOT DETECTED   Streptococcus species NOT DETECTED NOT DETECTED   Streptococcus agalactiae NOT DETECTED NOT DETECTED   Streptococcus pneumoniae NOT DETECTED NOT DETECTED   Streptococcus pyogenes NOT DETECTED NOT DETECTED   Acinetobacter baumannii NOT DETECTED NOT DETECTED   Enterobacteriaceae species DETECTED (A) NOT DETECTED   Enterobacter cloacae complex NOT DETECTED NOT DETECTED   Escherichia coli NOT DETECTED NOT DETECTED   Klebsiella oxytoca NOT DETECTED NOT DETECTED  Klebsiella pneumoniae NOT DETECTED NOT DETECTED   Proteus species NOT DETECTED NOT DETECTED   Serratia marcescens NOT DETECTED NOT DETECTED   Carbapenem resistance NOT DETECTED NOT DETECTED   Haemophilus influenzae NOT DETECTED NOT DETECTED   Neisseria meningitidis  NOT DETECTED NOT DETECTED   Pseudomonas aeruginosa NOT DETECTED NOT DETECTED   Candida albicans NOT DETECTED NOT DETECTED   Candida glabrata NOT DETECTED NOT DETECTED   Candida krusei NOT DETECTED NOT DETECTED   Candida parapsilosis NOT DETECTED NOT DETECTED   Candida tropicalis NOT DETECTED NOT DETECTED

## 2018-08-17 NOTE — Progress Notes (Addendum)
Entered in error. Patient seen by Dr. Kristine Linea, MD  8:55 AM

## 2018-08-17 NOTE — Progress Notes (Signed)
  Echocardiogram 2D Echocardiogram has been performed.  Kyle Conrad 08/17/2018, 10:06 AM

## 2018-08-17 NOTE — Procedures (Signed)
Central Venous hemodialysis Catheter Insertion Procedure Note Kyle Conrad [id number] Aug 18, 1965  Procedure: Insertion of Central Venous hemodialysis Catheter Indications: CRRT  Procedure Details Consent: Unable to obtain consent because of altered level of consciousness. Time Out: Verified patient identification, verified procedure, site/side was marked, verified correct patient position, special equipment/implants available, medications/allergies/relevent history reviewed, required imaging and test results available.  Performed  Maximum sterile technique was used including antiseptics, cap, gloves, gown, hand hygiene, mask and sheet. Skin prep: Chlorhexidine; local anesthetic administered A antimicrobial bonded/coated triple lumen catheter was placed in the right femoral vein due to patient being a dialysis patient using the Seldinger technique.  Evaluation Blood flow good Complications: No apparent complications Patient did tolerate procedure well.  Jasper Ruminski 08/17/2018, 1:05 PM

## 2018-08-17 NOTE — Progress Notes (Signed)
Pt received 80mg  bolus of lasix. 20cc u/o after bolus. MD paged. Awaiting return call.

## 2018-08-17 NOTE — Consult Note (Signed)
Reason for Consult:AKI, fluid overload Referring Physician: Dr. Lauris Chroman [name] is an 53 y.o. male.  HPI: 53 yr male with NICM, with EF 10-15%, nonadherence. Found down, confused yesterday.  Hypotensive also.  WGT >80 lb over past wgt in CHF clinic.  Not taking meds and freq NS.  Baseline Cr .9 to 1.2 and was 1.8 on admit yest.   Now oliguric, despite lasix. Progressive metabolic acidemia and Cr now 2.23.  Has pos BC for Enterobacter.  MS confused, and cannot answer. Hypotenive on Dobut/ Ne.  On ARB, NSAIDS at home Review of systems not obtained due to patient factors.   Past Medical History:  Diagnosis Date  . CHF (congestive heart failure) ([place])   . Hypertension     Past Surgical History:  Procedure Laterality Date  . CARDIAC CATHETERIZATION N/A 04/30/2015   Procedure: Right/Left Heart Cath and Coronary Angiography;  Surgeon: Larey Dresser, MD;  Location: [facility];  Service: Cardiovascular;  Laterality: N/A;  . COLONOSCOPY WITH PROPOFOL N/A 05/01/2015   Procedure: COLONOSCOPY WITH PROPOFOL;  Surgeon: Wilford Corner, MD;  Location: [facility] ENDOSCOPY;  Service: Endoscopy;  Laterality: N/A;  . ESOPHAGOGASTRODUODENOSCOPY (EGD) WITH PROPOFOL N/A 05/01/2015   Procedure: ESOPHAGOGASTRODUODENOSCOPY (EGD) WITH PROPOFOL;  Surgeon: Wilford Corner, MD;  Location: [facility] ENDOSCOPY;  Service: Endoscopy;  Laterality: N/A;  . INGUINAL HERNIA REPAIR Left 03/24/2018   Procedure: HERNIA REPAIR INGUINAL INCARCERATED;  Surgeon: Georganna Skeans, MD;  [facility];  Service: General;  Laterality: Left;  . INSERTION OF MESH Left 03/24/2018   Procedure: INSERTION OF MESH;  Surgeon: Georganna Skeans, MD;  Location: [place];  Service: General;  Laterality: Left;  . SHOULDER SURGERY      Family History  Problem Relation Age of Onset  . Lung cancer Mother     Social History:  reports that he has never smoked. He has never used smokeless tobacco. He reports that he does not drink alcohol or use  drugs.  Allergies:  Allergies  Allergen Reactions  . Lisinopril Swelling    REACTION: pt had a swelling episode.  His lips swelled  . Penicillins Hives    Has patient had a PCN reaction causing immediate rash, facial/tongue/throat swelling, SOB or lightheadedness with hypotension: Yes Has patient had a PCN reaction causing severe rash involving mucus membranes or skin necrosis: No Has patient had a PCN reaction that required hospitalization: No Has patient had a PCN reaction occurring within the last 10 years: No If all of the above answers are "NO", then may proceed with Cephalosporin use.    Medications:  I have reviewed the patient's current medications. Prior to Admission:  Medications Prior to Admission  Medication Sig Dispense Refill Last Dose  . metolazone (ZAROXOLYN) 2.5 MG tablet Take 1 tablet (2.5 mg total) by mouth daily. As directed by heart failure clinic 5 tablet 0 unk  . potassium chloride 20 MEQ TBCR Take 20 mEq by mouth 2 (two) times daily. 60 tablet 6 unk  . torsemide (DEMADEX) 20 MG tablet Take 2 tablets (40 mg total) by mouth 2 (two) times daily. 60 tablet 3 unk  . acetaminophen (TYLENOL) 500 MG tablet You can take 1000 mg every 8 hours as needed for pain.  Can alternate this with ibuprofen, or Tramadol.  Do not exceed 4000 mg of Tylenol(acetaminophen) per day it can harm your liver.  You can buy this over-the-counter at any drugstore. 30 tablet 0 unk  . docusate sodium (COLACE) 100 MG capsule Take  1 capsule (100 mg total) by mouth 2 (two) times daily. 60 capsule 0 unk  . folic acid (FOLVITE) 1 MG tablet Take 1 tablet (1 mg total) by mouth daily. 30 tablet 0 unk  . ibuprofen (ADVIL,MOTRIN) 200 MG tablet You can take 3 tablets every 6 hours as needed for pain.  You can alternate this with plain Tylenol or Tramadol.  You can buy this over-the-counter at any drugstore without a prescription.   unk  . losartan (COZAAR) 25 MG tablet Take 0.5 tablets (12.5 mg total) by mouth  at bedtime. 30 tablet 3 unk  . magnesium oxide (MAG-OX) 400 MG tablet Take 1 tablet (400 mg total) by mouth daily. 30 tablet 3 unk  . Multiple Vitamin (MULTIVITAMIN WITH MINERALS) TABS tablet Take 1 tablet by mouth daily. 30 tablet 0 unk  . polyethylene glycol (MIRALAX / GLYCOLAX) packet You can use this for constipation as instructed on the package directions.  You can buy this at any drugstore without a prescription. (Patient not taking: Reported on 08/16/2018) 14 each 0 Not Taking at Unknown time  . traMADol (ULTRAM) 50 MG tablet Take 1 tablet (50 mg total) by mouth every 6 (six) hours as needed for moderate pain (Third line use if pain not relieved by PO Tylenol and Ibuprofen). 20 tablet 0 unk    Results for orders placed or performed during the hospital encounter of 08/16/18 (from the past 48 hour(s))  CBG monitoring, ED     Status: Abnormal   Collection Time: 08/16/18  1:01 PM  Result Value Ref Range   Glucose-Capillary 67 (L) 70 - 99 mg/dL  Comprehensive metabolic panel     Status: Abnormal   Collection Time: 08/16/18  1:33 PM  Result Value Ref Range   Sodium 143 135 - 145 mmol/L   Potassium 4.2 3.5 - 5.1 mmol/L   Chloride 113 (H) 98 - 111 mmol/L   CO2 20 (L) 22 - 32 mmol/L   Glucose, Bld 44 (LL) 70 - 99 mg/dL    Comment: CRITICAL RESULT CALLED TO, READ BACK BY AND VERIFIED WITH: A TERTHERN,RN 1540 08/16/2018 WBOND    BUN 45 (H) 6 - 20 mg/dL   Creatinine, Ser 1.90 (H) 0.61 - 1.24 mg/dL   Calcium 8.5 (L) 8.9 - 10.3 mg/dL   Total Protein 6.0 (L) 6.5 - 8.1 g/dL   Albumin 1.9 (L) 3.5 - 5.0 g/dL   AST 48 (H) 15 - 41 U/L   ALT 23 0 - 44 U/L   Alkaline Phosphatase 114 38 - 126 U/L   Total Bilirubin 3.7 (H) 0.3 - 1.2 mg/dL   GFR calc non Af Amer 39 (L) >60 mL/min   GFR calc Af Amer 45 (L) >60 mL/min   Anion gap 10 5 - 15    Comment: Performed at [facility], [address], [place], [place] [address]  Lactic acid, plasma     Status: Abnormal   Collection Time: 08/16/18  1:33  PM  Result Value Ref Range   Lactic Acid, Venous 3.1 (HH) 0.5 - 1.9 mmol/L    Comment: CRITICAL RESULT CALLED TO, READ BACK BY AND VERIFIED WITH: COBB,K RN '@[id number]'$  ON [id number] BY FLEMINGS Performed at [facility] [address] [place], [place] [address]   CBC with Differential     Status: Abnormal   Collection Time: 08/16/18  1:33 PM  Result Value Ref Range   WBC 4.3 4.0 - 10.5 K/uL   RBC 4.41  4.22 - 5.81 MIL/uL   Hemoglobin 12.5 (L) 13.0 - 17.0 g/dL   HCT 40.6 39.0 - 52.0 %   MCV 92.1 80.0 - 100.0 fL   MCH 28.3 26.0 - 34.0 pg   MCHC 30.8 30.0 - 36.0 g/dL   RDW 17.7 (H) 11.5 - 15.5 %   Platelets 53 (L) 150 - 400 K/uL    Comment: REPEATED TO VERIFY PLATELET COUNT CONFIRMED BY SMEAR SPECIMEN CHECKED FOR CLOTS Immature Platelet Fraction may be clinically indicated, consider ordering this additional test [id number]    nRBC 0.5 (H) 0.0 - 0.2 %   Neutrophils Relative % 44 %   Neutro Abs 1.9 1.7 - 7.7 K/uL   Lymphocytes Relative 2 %   Lymphs Abs 0.1 (L) 0.7 - 4.0 K/uL   Monocytes Relative 1 %   Monocytes Absolute 0.0 (L) 0.1 - 1.0 K/uL   Eosinophils Relative 41 %   Eosinophils Absolute 1.8 (H) 0.0 - 0.5 K/uL   Basophils Relative 1 %   Basophils Absolute 0.1 0.0 - 0.1 K/uL   WBC Morphology MILD LEFT SHIFT (1-5% METAS, OCC MYELO, OCC BANDS)    Immature Granulocytes 11 %   Abs Immature Granulocytes 0.48 (H) 0.00 - 0.07 K/uL    Comment: Performed at [facility], [address], [place], [place] [address]  Troponin I - Once     Status: None   Collection Time: 08/16/18  1:33 PM  Result Value Ref Range   Troponin I <0.03 <0.03 ng/mL    Comment: Performed at [facility] [address], [place], [place] [address]  CK     Status: None   Collection Time: 08/16/18  1:33 PM  Result Value Ref Range   Total CK 194 49 - 397 U/L    Comment: Performed at [facility], [place] [address], [place], [place] [address]  Ammonia     Status: None   Collection Time:  08/16/18  1:33 PM  Result Value Ref Range   Ammonia 20 9 - 35 umol/L    Comment: Performed at [facility], [place] [address], [place], [place] [address]  Culture, blood (routine x 2)     Status: None (Preliminary result)   Collection Time: 08/16/18  1:33 PM  Result Value Ref Range   Specimen Description BLOOD RIGHT ANTECUBITAL    Special Requests      BOTTLES DRAWN AEROBIC AND ANAEROBIC Blood Culture adequate volume   Culture  Setup Time      IN BOTH AEROBIC AND ANAEROBIC BOTTLES GRAM NEGATIVE RODS Organism ID to follow CRITICAL RESULT CALLED TO, READ BACK BY AND VERIFIED WITH: K.AMEND,PHARMD AT 0254 ON 08/17/18 BY G.MCADOO    Culture      NO GROWTH < 24 HOURS Performed at [facility], [place] [address], [place], [place] [address]    Report Status PENDING   Blood Culture ID Panel (Reflexed)     Status: Abnormal   Collection Time: 08/16/18  1:33 PM  Result Value Ref Range   Enterococcus species NOT DETECTED NOT DETECTED   Listeria monocytogenes NOT DETECTED NOT DETECTED   Staphylococcus species NOT DETECTED NOT DETECTED   Staphylococcus aureus (BCID) NOT DETECTED NOT DETECTED   Streptococcus species NOT DETECTED NOT DETECTED   Streptococcus agalactiae NOT DETECTED NOT DETECTED   Streptococcus pneumoniae NOT DETECTED NOT DETECTED   Streptococcus pyogenes NOT DETECTED NOT DETECTED   Acinetobacter baumannii NOT DETECTED NOT DETECTED   Enterobacteriaceae  species DETECTED (A) NOT DETECTED    Comment: Enterobacteriaceae represent a large family of gram negative bacteria, not a single organism. Refer to culture for further identification. CRITICAL RESULT CALLED TO, READ BACK BY AND VERIFIED WITH: K.AMEND,PHARMD AT 0254 ON 08/17/18 BY G.MCADOO    Enterobacter cloacae complex NOT DETECTED NOT DETECTED   Escherichia coli NOT DETECTED NOT DETECTED   Klebsiella oxytoca NOT DETECTED NOT DETECTED   Klebsiella pneumoniae NOT DETECTED NOT DETECTED   Proteus species NOT DETECTED NOT  DETECTED   Serratia marcescens NOT DETECTED NOT DETECTED   Carbapenem resistance NOT DETECTED NOT DETECTED   Haemophilus influenzae NOT DETECTED NOT DETECTED   Neisseria meningitidis NOT DETECTED NOT DETECTED   Pseudomonas aeruginosa NOT DETECTED NOT DETECTED   Candida albicans NOT DETECTED NOT DETECTED   Candida glabrata NOT DETECTED NOT DETECTED   Candida krusei NOT DETECTED NOT DETECTED   Candida parapsilosis NOT DETECTED NOT DETECTED   Candida tropicalis NOT DETECTED NOT DETECTED    Comment: Performed at [facility], [place] [address], [place], [place] [address]  CBG monitoring, ED     Status: Abnormal   Collection Time: 08/16/18  1:46 PM  Result Value Ref Range   Glucose-Capillary 30 (LL) 70 - 99 mg/dL   Comment 1 Notify RN    Comment 2 Document in Chart   CBG monitoring, ED     Status: None   Collection Time: 08/16/18  2:16 PM  Result Value Ref Range   Glucose-Capillary 88 70 - 99 mg/dL  Culture, blood (routine x 2)     Status: None (Preliminary result)   Collection Time: 08/16/18  2:20 PM  Result Value Ref Range   Specimen Description BLOOD RIGHT HAND    Special Requests      BOTTLES DRAWN AEROBIC AND ANAEROBIC Blood Culture adequate volume   Culture      NO GROWTH < 24 HOURS Performed at [facility], [place] [address] [place], [place] [address]    Report Status PENDING   TSH     Status: Abnormal   Collection Time: 08/16/18  2:24 PM  Result Value Ref Range   TSH 5.578 (H) 0.350 - 4.500 uIU/mL    Comment: Performed by a 3rd Generation assay with a functional sensitivity of <=0.01 uIU/mL. Performed at [facility], [place] [address], [place], [place] [address]   CBG monitoring, ED     Status: Abnormal   Collection Time: 08/16/18  3:17 PM  Result Value Ref Range   Glucose-Capillary 63 (L) 70 - 99 mg/dL  Cortisol     Status: None   Collection Time: 08/16/18  4:11 PM  Result Value Ref Range   Cortisol, Plasma 28.6 ug/dL    Comment: (NOTE) AM    6.7 -  22.6 ug/dL PM   <10.0       ug/dL Performed at [facility] [address], [place], [place] [address]   Comprehensive metabolic panel     Status: Abnormal   Collection Time: 08/16/18  4:11 PM  Result Value Ref Range   Sodium 141 135 - 145 mmol/L   Potassium 4.0 3.5 - 5.1 mmol/L   Chloride 114 (H) 98 - 111 mmol/L   CO2 18 (L) 22 - 32 mmol/L   Glucose, Bld 84 70 - 99 mg/dL   BUN 46 (H) 6 - 20 mg/dL   Creatinine, Ser 1.83 (H) 0.61 - 1.24 mg/dL   Calcium 8.2 (L) 8.9 -  10.3 mg/dL   Total Protein 5.2 (L) 6.5 - 8.1 g/dL   Albumin 1.7 (L) 3.5 - 5.0 g/dL   AST 54 (H) 15 - 41 U/L   ALT 21 0 - 44 U/L   Alkaline Phosphatase 99 38 - 126 U/L   Total Bilirubin 3.7 (H) 0.3 - 1.2 mg/dL   GFR calc non Af Amer 41 (L) >60 mL/min   GFR calc Af Amer 47 (L) >60 mL/min   Anion gap 9 5 - 15    Comment: Performed at [facility] [address] [place], [place] [address]  Magnesium     Status: None   Collection Time: 08/16/18  4:11 PM  Result Value Ref Range   Magnesium 1.7 1.7 - 2.4 mg/dL    Comment: Performed at [facility], [place] [address] [place], [place] [address]  Phosphorus     Status: None   Collection Time: 08/16/18  4:11 PM  Result Value Ref Range   Phosphorus 3.9 2.5 - 4.6 mg/dL    Comment: Performed at [facility] [address], [place], [place] [address]  Amylase     Status: None   Collection Time: 08/16/18  4:11 PM  Result Value Ref Range   Amylase 28 28 - 100 U/L    Comment: Performed at [facility], [place] [address], [place], [place] [address]  Procalcitonin     Status: None   Collection Time: 08/16/18  4:11 PM  Result Value Ref Range   Procalcitonin 6.76 ng/mL    Comment:        Interpretation: PCT > 2 ng/mL: Systemic infection (sepsis) is likely, unless other causes are known. (NOTE)       Sepsis PCT Algorithm           Lower Respiratory Tract                                      Infection PCT Algorithm    ----------------------------      ----------------------------         PCT < 0.25 ng/mL                PCT < 0.10 ng/mL         Strongly encourage             Strongly discourage   discontinuation of antibiotics    initiation of antibiotics    ----------------------------     -----------------------------       PCT 0.25 - 0.50 ng/mL            PCT 0.10 - 0.25 ng/mL               OR       >80% decrease in PCT            Discourage initiation of                                            antibiotics      Encourage discontinuation           of antibiotics    ----------------------------     -----------------------------         PCT >= 0.50 ng/mL  PCT 0.26 - 0.50 ng/mL               AND       <80% decrease in PCT              Encourage initiation of                                             antibiotics       Encourage continuation           of antibiotics    ----------------------------     -----------------------------        PCT >= 0.50 ng/mL                  PCT > 0.50 ng/mL               AND         increase in PCT                  Strongly encourage                                      initiation of antibiotics    Strongly encourage escalation           of antibiotics                                     -----------------------------                                           PCT <= 0.25 ng/mL                                                 OR                                        > 80% decrease in PCT                                     Discontinue / Do not initiate                                             antibiotics Performed at [facility], [address], [place], [place] [address]   Brain natriuretic peptide     Status: Abnormal   Collection Time: 08/16/18  4:11 PM  Result Value Ref Range   B Natriuretic Peptide 2,433.3 (H) 0.0 - 100.0 pg/mL    Comment: Performed at [facility] [address], [place], [place] [address]  CBC WITH DIFFERENTIAL     Status: Abnormal    Collection Time: 08/16/18  4:11  PM  Result Value Ref Range   WBC 2.2 (L) 4.0 - 10.5 K/uL   RBC 3.73 (L) 4.22 - 5.81 MIL/uL   Hemoglobin 10.9 (L) 13.0 - 17.0 g/dL   HCT 34.0 (L) 39.0 - 52.0 %   MCV 91.2 80.0 - 100.0 fL   MCH 29.2 26.0 - 34.0 pg   MCHC 32.1 30.0 - 36.0 g/dL   RDW 17.8 (H) 11.5 - 15.5 %   Platelets 45 (L) 150 - 400 K/uL    Comment: REPEATED TO VERIFY Immature Platelet Fraction may be clinically indicated, consider ordering this additional test [id number] CONSISTENT WITH PREVIOUS RESULT    nRBC 0.9 (H) 0.0 - 0.2 %   Neutrophils Relative % 94 %   Neutro Abs 2.1 1.7 - 7.7 K/uL   Lymphocytes Relative 4 %   Lymphs Abs 0.1 (L) 0.7 - 4.0 K/uL   Monocytes Relative 0 %   Monocytes Absolute 0.0 (L) 0.1 - 1.0 K/uL   Eosinophils Relative 0 %   Eosinophils Absolute 0.0 0.0 - 0.5 K/uL   Basophils Relative 0 %   Basophils Absolute 0.0 0.0 - 0.1 K/uL   WBC Morphology See Note     Comment: Mild Left Shift. 1 to 5% Metas and Myelos, Occ Pro Noted. Toxic Granulation   nRBC 4 (H) 0 /100 WBC   Myelocytes 1 %   Promyelocytes Relative 1 %   Abs Immature Granulocytes 0.00 0.00 - 0.07 K/uL   Polychromasia PRESENT     Comment: Performed at [facility] [address], [place], [place] [address]  Protime-INR     Status: Abnormal   Collection Time: 08/16/18  4:11 PM  Result Value Ref Range   Prothrombin Time 40.5 (H) 11.4 - 15.2 seconds   INR 4.3 (HH) 0.8 - 1.2    Comment: REPEATED TO VERIFY CRITICAL RESULT CALLED TO, READ BACK BY AND VERIFIED WITH: C SALAS RN AT 1726 ON [id number] BY K FORSYTH (NOTE) INR goal varies based on device and disease states. Performed at [facility], [place] [address], [place], [place] [address]   D-dimer, quantitative (not at [facility])     Status: Abnormal   Collection Time: 08/16/18  4:11 PM  Result Value Ref Range   D-Dimer, Quant 3.83 (H) 0.00 - 0.50 ug/mL-FEU    Comment: (NOTE) At the manufacturer cut-off of 0.50 ug/mL FEU, this assay has  been documented to exclude PE with a sensitivity and negative predictive value of 97 to 99%.  At this time, this assay has not been approved by the FDA to exclude DVT/VTE. Results should be correlated with clinical presentation. Performed at [facility], [place] [address], [place], [place] [address]   APTT     Status: Abnormal   Collection Time: 08/16/18  4:11 PM  Result Value Ref Range   aPTT 59 (H) 24 - 36 seconds    Comment:        IF BASELINE aPTT IS ELEVATED, SUGGEST PATIENT RISK ASSESSMENT BE USED TO DETERMINE APPROPRIATE ANTICOAGULANT THERAPY. Performed at [facility], [place] [address] [place], [place] [address]   T4, free     Status: None   Collection Time: 08/16/18  4:11 PM  Result Value Ref Range   Free T4 1.09 0.82 - 1.77 ng/dL    Comment: (NOTE) Biotin ingestion may interfere with free T4 tests. If the results are inconsistent with the TSH level, previous test results, or the clinical presentation, then consider  biotin interference. If needed, order repeat testing after stopping biotin. Performed at [facility], [place] [address] [place], [place] [address]   Glucose, capillary     Status: Abnormal   Collection Time: 08/16/18  4:11 PM  Result Value Ref Range   Glucose-Capillary 35 (LL) 70 - 99 mg/dL  Glucose, capillary     Status: Abnormal   Collection Time: 08/16/18  4:34 PM  Result Value Ref Range   Glucose-Capillary 60 (L) 70 - 99 mg/dL  T3, free     Status: Abnormal   Collection Time: 08/16/18  4:43 PM  Result Value Ref Range   T3, Free 1.2 (L) 2.0 - 4.4 pg/mL    Comment: (NOTE) Performed At: [facility] [address] [place], [place] [id number] Rush Farmer MD LD:[id number]   Lactic acid, plasma     Status: Abnormal   Collection Time: 08/16/18  4:45 PM  Result Value Ref Range   Lactic Acid, Venous 4.3 (HH) 0.5 - 1.9 mmol/L    Comment: CRITICAL RESULT CALLED TO, READ BACK BY AND VERIFIED WITHRinaldo Ratel 1753 08/16/2018  WBOND Performed at [facility], [place] [address] [place], [place] [address]   Glucose, capillary     Status: None   Collection Time: 08/16/18  4:52 PM  Result Value Ref Range   Glucose-Capillary 98 70 - 99 mg/dL  Glucose, capillary     Status: Abnormal   Collection Time: 08/16/18  5:50 PM  Result Value Ref Range   Glucose-Capillary 57 (L) 70 - 99 mg/dL  .Cooxemetry Panel (carboxy, met, total hgb, O2 sat)     Status: Abnormal   Collection Time: 08/16/18  6:02 PM  Result Value Ref Range   Total hemoglobin 11.7 (L) 12.0 - 16.0 g/dL   O2 Saturation 64.0 %   Carboxyhemoglobin 0.9 0.5 - 1.5 %   Methemoglobin 1.1 0.0 - 1.5 %  MRSA PCR Screening     Status: None   Collection Time: 08/16/18  6:45 PM  Result Value Ref Range   MRSA by PCR NEGATIVE NEGATIVE    Comment:        The GeneXpert MRSA Assay (FDA approved for NASAL specimens only), is one component of a comprehensive MRSA colonization surveillance program. It is not intended to diagnose MRSA infection nor to guide or monitor treatment for MRSA infections. Performed at [facility], [place] [address], [place], [place] [address]   Glucose, capillary     Status: Abnormal   Collection Time: 08/16/18  6:52 PM  Result Value Ref Range   Glucose-Capillary 47 (L) 70 - 99 mg/dL  Glucose, capillary     Status: Abnormal   Collection Time: 08/16/18  8:23 PM  Result Value Ref Range   Glucose-Capillary 33 (LL) 70 - 99 mg/dL  Glucose, capillary     Status: None   Collection Time: 08/16/18  9:31 PM  Result Value Ref Range   Glucose-Capillary 73 70 - 99 mg/dL  Lactic acid, plasma     Status: Abnormal   Collection Time: 08/16/18 10:27 PM  Result Value Ref Range   Lactic Acid, Venous 4.9 (HH) 0.5 - 1.9 mmol/L    Comment: CRITICAL RESULT CALLED TO, READ BACK BY AND VERIFIED WITH: COUNCIL,W RN 08/16/2018 2306 JORDANS Performed at [facility], [place] [address], [place], [place] [address]   Glucose, capillary     Status:  Abnormal   Collection Time: 08/16/18 10:31 PM  Result Value Ref Range  Glucose-Capillary 36 (LL) 70 - 99 mg/dL  Glucose, random     Status: Abnormal   Collection Time: 08/16/18 10:50 PM  Result Value Ref Range   Glucose, Bld 36 (LL) 70 - 99 mg/dL    Comment: CRITICAL RESULT CALLED TO, READ BACK BY AND VERIFIED WITH: COUNCIL,W RN 08/17/2018 0001 JORDANS Performed at [facility], [place] [address] [place], [place] [address]   Glucose, capillary     Status: Abnormal   Collection Time: 08/16/18 11:45 PM  Result Value Ref Range   Glucose-Capillary 45 (L) 70 - 99 mg/dL  Glucose, capillary     Status: None   Collection Time: 08/17/18  1:01 AM  Result Value Ref Range   Glucose-Capillary 70 70 - 99 mg/dL  Urinalysis, Routine w reflex microscopic     Status: Abnormal   Collection Time: 08/17/18  1:23 AM  Result Value Ref Range   Color, Urine AMBER (A) YELLOW    Comment: BIOCHEMICALS MAY BE AFFECTED BY COLOR   APPearance HAZY (A) CLEAR   Specific Gravity, Urine 1.020 1.005 - 1.030   pH 5.0 5.0 - 8.0   Glucose, UA NEGATIVE NEGATIVE mg/dL   Hgb urine dipstick SMALL (A) NEGATIVE   Bilirubin Urine NEGATIVE NEGATIVE   Ketones, ur NEGATIVE NEGATIVE mg/dL   Protein, ur 30 (A) NEGATIVE mg/dL   Nitrite NEGATIVE NEGATIVE   Leukocytes,Ua NEGATIVE NEGATIVE   RBC / HPF 0-5 0 - 5 RBC/hpf   WBC, UA 0-5 0 - 5 WBC/hpf   Bacteria, UA RARE (A) NONE SEEN   Squamous Epithelial / LPF 0-5 0 - 5   Mucus PRESENT    Hyaline Casts, UA PRESENT     Comment: Performed at [facility], [address], [place], [place] [address]  Strep pneumoniae urinary antigen  (not at [facility])     Status: None   Collection Time: 08/17/18  1:23 AM  Result Value Ref Range   Strep Pneumo Urinary Antigen NEGATIVE NEGATIVE    Comment:        Infection due to S. pneumoniae cannot be absolutely ruled out since the antigen present may be below the detection limit of the test. Performed at [facility], [place] [address] [place], [place] [address]   Glucose, capillary     Status: Abnormal   Collection Time: 08/17/18  1:52 AM  Result Value Ref Range   Glucose-Capillary 41 (LL) 70 - 99 mg/dL  Glucose, capillary     Status: Abnormal   Collection Time: 08/17/18  2:49 AM  Result Value Ref Range   Glucose-Capillary 68 (L) 70 - 99 mg/dL  CBC     Status: Abnormal   Collection Time: 08/17/18  3:53 AM  Result Value Ref Range   WBC 4.9 4.0 - 10.5 K/uL   RBC 3.79 (L) 4.22 - 5.81 MIL/uL   Hemoglobin 10.8 (L) 13.0 - 17.0 g/dL   HCT 34.5 (L) 39.0 - 52.0 %   MCV 91.0 80.0 - 100.0 fL   MCH 28.5 26.0 - 34.0 pg   MCHC 31.3 30.0 - 36.0 g/dL   RDW 17.8 (H) 11.5 - 15.5 %   Platelets 44 (L) 150 - 400 K/uL    Comment: REPEATED TO VERIFY Immature Platelet Fraction may be clinically indicated, consider ordering this additional test [id number] CONSISTENT WITH PREVIOUS RESULT    nRBC 0.6 (H) 0.0 - 0.2 %    Comment: Performed at [facility], [place] [address]  [place], [place] [address]  Basic metabolic panel     Status: Abnormal   Collection Time: 08/17/18  3:53 AM  Result Value Ref Range   Sodium 140 135 - 145 mmol/L   Potassium 4.4 3.5 - 5.1 mmol/L   Chloride 112 (H) 98 - 111 mmol/L   CO2 17 (L) 22 - 32 mmol/L   Glucose, Bld 75 70 - 99 mg/dL   BUN 49 (H) 6 - 20 mg/dL   Creatinine, Ser 2.15 (H) 0.61 - 1.24 mg/dL   Calcium 8.2 (L) 8.9 - 10.3 mg/dL   GFR calc non Af Amer 34 (L) >60 mL/min   GFR calc Af Amer 39 (L) >60 mL/min   Anion gap 11 5 - 15    Comment: Performed at [facility] [address], [place], [place] [address]  Magnesium     Status: None   Collection Time: 08/17/18  3:53 AM  Result Value Ref Range   Magnesium 1.8 1.7 - 2.4 mg/dL    Comment: Performed at [facility] [address], [place], [place] [address]  Phosphorus     Status: None   Collection Time: 08/17/18  3:53 AM  Result Value Ref Range   Phosphorus 3.8 2.5 - 4.6 mg/dL    Comment: Performed at [facility] [address] [place], [place] [address]  Glucose, capillary     Status: None   Collection Time: 08/17/18  3:58 AM  Result Value Ref Range   Glucose-Capillary 78 70 - 99 mg/dL  .Cooxemetry Panel (carboxy, met, total hgb, O2 sat)     Status: Abnormal   Collection Time: 08/17/18  4:50 AM  Result Value Ref Range   Total hemoglobin 10.9 (L) 12.0 - 16.0 g/dL   O2 Saturation 82.6 %   Carboxyhemoglobin 1.4 0.5 - 1.5 %   Methemoglobin 1.2 0.0 - 1.5 %  Glucose, capillary     Status: None   Collection Time: 08/17/18  4:56 AM  Result Value Ref Range   Glucose-Capillary 85 70 - 99 mg/dL  Glucose, capillary     Status: None   Collection Time: 08/17/18  5:51 AM  Result Value Ref Range   Glucose-Capillary 87 70 - 99 mg/dL  Glucose, capillary     Status: None   Collection Time: 08/17/18  6:44 AM  Result Value Ref Range   Glucose-Capillary 87 70 - 99 mg/dL  Glucose, capillary     Status: None   Collection Time: 08/17/18  8:57 AM  Result Value Ref Range   Glucose-Capillary 92 70 - 99 mg/dL  I-STAT 7, (LYTES, BLD GAS, ICA, H+H)     Status: Abnormal   Collection Time: 08/17/18  9:12 AM  Result Value Ref Range   pH, Arterial 7.383 7.350 - 7.450   pCO2 arterial 28.1 (L) 32.0 - 48.0 mmHg   pO2, Arterial 79.0 (L) 83.0 - 108.0 mmHg   Bicarbonate 16.7 (L) 20.0 - 28.0 mmol/L   TCO2 18 (L) 22 - 32 mmol/L   O2 Saturation 96.0 %   Acid-base deficit 7.0 (H) 0.0 - 2.0 mmol/L   Sodium 143 135 - 145 mmol/L   Potassium 4.6 3.5 - 5.1 mmol/L   Calcium, Ion 1.21 1.15 - 1.40 mmol/L   HCT 36.0 (L) 39.0 - 52.0 %   Hemoglobin 12.2 (L) 13.0 - 17.0 g/dL   Patient temperature 37.2 C    Collection site RADIAL, ALLEN'S TEST ACCEPTABLE    Sample type ARTERIAL  Ammonia     Status: None   Collection Time: 08/17/18  9:23 AM  Result Value Ref Range   Ammonia 35 9 - 35 umol/L    Comment: Performed at [facility], [place] [address] [place], [place] [address]  Protime-INR     Status: Abnormal    Collection Time: 08/17/18  9:27 AM  Result Value Ref Range   Prothrombin Time 36.8 (H) 11.4 - 15.2 seconds   INR 3.8 (H) 0.8 - 1.2    Comment: (NOTE) INR goal varies based on device and disease states. Performed at [facility], [place] [address] [place], [place] [address]   Glucose, capillary     Status: Abnormal   Collection Time: 08/17/18  9:54 AM  Result Value Ref Range   Glucose-Capillary 102 (H) 70 - 99 mg/dL  Glucose, capillary     Status: Abnormal   Collection Time: 08/17/18 11:23 AM  Result Value Ref Range   Glucose-Capillary 109 (H) 70 - 99 mg/dL  Basic metabolic panel     Status: Abnormal   Collection Time: 08/17/18  1:34 PM  Result Value Ref Range   Sodium 138 135 - 145 mmol/L   Potassium 4.5 3.5 - 5.1 mmol/L   Chloride 111 98 - 111 mmol/L   CO2 19 (L) 22 - 32 mmol/L   Glucose, Bld 143 (H) 70 - 99 mg/dL   BUN 53 (H) 6 - 20 mg/dL   Creatinine, Ser 2.23 (H) 0.61 - 1.24 mg/dL   Calcium 7.6 (L) 8.9 - 10.3 mg/dL   GFR calc non Af Amer 32 (L) >60 mL/min   GFR calc Af Amer 37 (L) >60 mL/min   Anion gap 8 5 - 15    Comment: Performed at [facility] [address] [place], [place] [address]  Glucose, capillary     Status: Abnormal   Collection Time: 08/17/18  1:37 PM  Result Value Ref Range   Glucose-Capillary 135 (H) 70 - 99 mg/dL    US Renal  Result Date: 08/17/2018 CLINICAL DATA:  Inpatient.  Acute kidney injury. EXAM: RENAL / URINARY TRACT ULTRASOUND COMPLETE COMPARISON:  03/24/2018 CT abdomen/pelvis. FINDINGS: Right Kidney: Renal measurements: 9.8 x 4.6 x 5.0 cm = volume: 117 mL. Echogenic right renal parenchyma, which appears within normal limits in thickness. No hydronephrosis. No renal mass. Left Kidney: Renal measurements: 9.7 x 5.0 x 4.7 cm = volume: 119 mL. Echogenic left renal parenchyma, which appears normal in thickness. No hydronephrosis. No renal mass. Bladder: Appears normal for degree of bladder distention. Incidentally noted large volume  ascites. IMPRESSION: 1. No hydronephrosis. 2. 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 3. Normal bladder. 4. Incidental large volume ascites. Electronically Signed   By: Kyle Sorrel M.D.   On: 08/17/2018 08:35   Dg Chest Port 1 View  Result Date: 08/17/2018 CLINICAL DATA:  Shortness of breath, pneumonia EXAM: PORTABLE CHEST 1 VIEW COMPARISON:  08/16/2018 FINDINGS: Cardiomegaly, vascular congestion. Low lung volumes. Bibasilar atelectasis or infiltrates, increased since prior study. No effusions or acute bony abnormality. IMPRESSION: Cardiomegaly, vascular congestion. Bibasilar atelectasis or infiltrates, increasing since prior study. Electronically Signed   By: Kyle Baptise M.D.   On: 08/17/2018 07:24   Dg Chest Port 1 View  Result Date: 08/16/2018 CLINICAL DATA:  Central line placement. EXAM: PORTABLE CHEST 1 VIEW COMPARISON:  Chest x-ray from same day. FINDINGS: Interval placement of a right subclavian central venous catheter with  the tip in the distal SVC. Stable cardiomegaly with unchanged pulmonary vascular congestion. No focal consolidation, pleural effusion, or pneumothorax. No acute osseous abnormality. IMPRESSION: 1. Right subclavian central venous catheter with tip in the distal SVC. No pneumothorax. 2. Stable cardiomegaly and pulmonary vascular congestion. Electronically Signed   By: Kyle Dubin M.D.   On: 08/16/2018 18:08   Dg Chest Portable 1 View  Result Date: 08/16/2018 CLINICAL DATA:  Initial evaluation for acute shortness of breath. EXAM: PORTABLE CHEST 1 VIEW COMPARISON:  Prior radiograph from 03/24/2018. FINDINGS: Advanced cardiomegaly, similar to previous. Mediastinal silhouette within normal limits. Lungs mildly hypoinflated. Diffuse pulmonary vascular congestion with interstitial prominence, suggesting mild pulmonary interstitial congestion/edema. No definite pleural effusion. No consolidative opacity. No  pneumothorax. No acute osseous finding. Prominent degenerative changes about the shoulders bilaterally. IMPRESSION: 1. Cardiomegaly with mild diffuse pulmonary interstitial congestion/edema. 2. No other active cardiopulmonary disease. Electronically Signed   By: Kyle Boga M.D.   On: 08/16/2018 13:50   Dg Abd Portable 1 View  Result Date: 08/16/2018 CLINICAL DATA:  Initial evaluation for acute abdominal distension. EXAM: PORTABLE ABDOMEN - 1 VIEW COMPARISON:  Prior radiograph from 03/24/2018 FINDINGS: Visualized bowel gas pattern within normal limits without obstruction or ileus. Mild gaseous distension of the stomach. No abnormal bowel wall thickening. No appreciable free air on this limited single supine partial view of the abdomen. No soft tissue mass or abnormal calcification. Visualized osseous structures demonstrate no acute finding. Osteoarthritic changes about the hips bilaterally. Degenerative changes noted within the lower lumbar spine. IMPRESSION: Nonobstructive bowel gas pattern with no radiographic evidence for acute intra-abdominal pathology. Electronically Signed   By: Kyle Boga M.D.   On: 08/16/2018 13:54    ROS Blood pressure 99/70, pulse (!) 145, temperature (!) 96.6 F (35.9 C), resp. rate 20, height 6' (1.829 m), weight 104.5 kg, SpO2 99 %. Physical Exam Physical Examination: General appearance - dyspneic, , mod distress Mental status - does not respond in reproducible manner, no verb Mouth - mucous membranes moist, pharynx normal without lesions Neck - adenopathy noted PCL Lymphatics - posterior cervical nodes Chest - rales noted bilat, rhonchi noted bilat Heart - S1 and S2 normal, tachy, Gr 2/6 holosys M Abdomen - distended, pos bs, umb hernia, liver down 7 cm Extremities - pedal edema 4 + Skin - shiny with edema, blistering on feet and calves U/S small echodense kidneys  Assessment/Plan: 1 AKI  Hemodynamic.  Predisposed to injury with AFR, NSAIDs,  Severe vol xs , metab acidemia.  Cardiogenic and septic etio. Needs RRT, vol and acidemia 2 CM willnot tol this situation well 3 Hypotension major issue 4. Anemia mild 5. Enterobact sepsis , on AB, ? Bowel source 6 Nonadherence P CRRT, q 6h Lasix, pressors, check Urine chem. Cont AB  Kyle Conrad 08/17/2018, 2:14 PM

## 2018-08-17 NOTE — Progress Notes (Addendum)
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  Being admitted 4/2 with acute mental status change, and decompensated biventricular heart failure and shock  History of present illness   53 year old male patient followed at the heart failure clinic with known ejection fraction of 10 to 15% with biventricular heart failure. Presents to the emergency room on 4/2 after being found by a neighbor on the floor at his house.  Reportedly he had just fallen but it is unclear exactly when this occurred as it was unwitnessed.  He was confused, reporting weakness to be generalized.  His weight had increased.  Last weight recorded in the clinic was 94 kg, he weighed 112 kg on presentation.  On EMS arrival his blood glucose was 47.  On arrival he was hypothermic with temperature of 86.9 Fahrenheit hypotensive with systolic blood pressure initially in the 80s but then down into the 50s he had diffuse 3-4+ pitting edema requiring 4 L to keep saturations greater than 90%.  Because of his hypotension, as well as altered sensorium the critical care team was asked to evaluate.  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  Severe mitral valve regurgitation. Diverticulosis Angioedema with ACE inhibitor Iron deficiency anemia Possible cirrhosis  Significant Hospital Events   4/2 admitted with hypoxia, delirium, and decompensated biventricular heart failure with circulatory shock 4/3 BCID Enterobacteriaceae species  Consults:  HF 4/2  Procedures:  R CVC 4/2>>  Significant Diagnostic Tests:  CXR 4/3>> increasing bilateral pulmonary infiltrates RUS 4/3>> no hydronephrosis, large volume ascites  Micro Data:   Blood cultures x2/2 > BCID Enterobacteriaceae Urine culture 4/2>> U strep 4/2>>> negative u legionella 4/2>>>  Antimicrobials:  Levofloxacin 4/2/ Vancomycin 4/2>4/3 Cefepmine 4/3>>  Interim history/subjective:  Awake and alert, however not able to speak. Glucose stabilized with D50 infusion. Had minimal UOP over night.  Objective   Blood pressure (!) 107/58, pulse (!) 145, temperature 99.3 F (37.4 C), resp. rate (!) 23, height 6' (1.829 m), weight 104.5 kg, SpO2 99 %. CVP:  [16 mmHg-28 mmHg] 16 mmHg      Intake/Output Summary (Last 24 hours) at 08/17/2018 0840 Last data filed at 08/17/2018 0600 Gross per 24 hour  Intake 2308.54 ml  Output 50 ml  Net 2258.54 ml   Filed Weights   08/16/18 1312 08/16/18 1800 08/17/18 0500  Weight: 112 kg 90.4 kg 104.5 kg    Examination: General: Awake and alert, NAD HENT: anicteric sclera Lungs: No increased work of breathing, upper airway sounds but overall clear lung sounds bilaterally Cardiovascular: RRR, no M/R Abdomen: Distended, active bowel sounds Extremities: Anasarca with LE wounds and blisters L>R Neuro: Generalized weakness but able to move extremities to command 2/5 strength, EOMI, PERRL  Resolved Hospital Problem list    Assessment & Plan:  Acute Hypoxic Respiratory failure in setting of decompensated HF w/ volume overload/pulm edema Plan Supplemental oxygen IV lasix  Pulse OX NPO  Acute decompensated Biventricular Heart failure w/ circulatory shock and AKI. Favor cardiogenic shock but also has possible Gram - rod bacteremia. Plan Start dobutamine and titrate off of levophed per HF F/u echo Daily weight, ins/outs Follow cultures Stress dose steroids Continue cefepime only  AKI, in setting of  above, RUS w/o hydronephrosis. In/out caths, place foley if needed Follow BMet May need consideration for CRRT trial  Acute metabolic encephalopathy  Plan Cont supportive care Hold sedating meds  Mild lactic acidosis   Plan Treat shock  Refractory Hypoglycemia- not of hypoglycemics at home, likely 2/2 sepsis and cardiac failure Plan Trend hourly glucose  Continue D50 for now, feed when able  Best practice:  Diet: NPO, pending speech eval Pain/Anxiety/Delirium protocol (if indicated): n/a VAP protocol (if indicated): n/a DVT prophylaxis: sq heparin GI prophylaxis: pepcid Glucose control: n/a Mobility: BR due to profound weakness Code Status: FULL Family Communication: pending Disposition: ICU  Labs   CBC: Recent Labs  Lab 08/16/18 1333 08/16/18 1611 08/17/18 0353  WBC 4.3 2.2* 4.9  NEUTROABS 1.9 2.1  --   HGB 12.5* 10.9* 10.8*  HCT 40.6 34.0* 34.5*  MCV 92.1 91.2 91.0  PLT 53* 45* 44*    Basic Metabolic Panel: Recent Labs  Lab 08/16/18 1333 08/16/18 1611 08/16/18 2250 08/17/18 0353  NA 143 141  --  140  K 4.2 4.0  --  4.4  CL 113* 114*  --  112*  CO2 20* 18*  --  17*  GLUCOSE 44* 84 36* 75  BUN 45* 46*  --  49*  CREATININE 1.90* 1.83*  --  2.15*  CALCIUM 8.5* 8.2*  --  8.2*  MG  --  1.7  --  1.8  PHOS  --  3.9  --  3.8   GFR: Estimated Creatinine Clearance: 49.1 mL/min (A) (by C-G formula based on SCr of 2.15 mg/dL (H)). Recent Labs  Lab 08/16/18 1333 08/16/18 1611 08/16/18 1645 08/16/18 2227 08/17/18 0353  PROCALCITON  --  6.76  --   --   --   WBC 4.3 2.2*  --   --  4.9  LATICACIDVEN 3.1*  --  4.3* 4.9*  --     Liver Function Tests: Recent Labs  Lab 08/16/18 1333 08/16/18 1611  AST 48* 54*  ALT 23 21  ALKPHOS 114 99  BILITOT 3.7* 3.7*  PROT 6.0* 5.2*  ALBUMIN 1.9* 1.7*   Recent Labs  Lab 08/16/18 1611  AMYLASE 28   Recent Labs  Lab 08/16/18 1333  AMMONIA 20    ABG    Component Value Date/Time   PHART 7.468 (H) 03/10/2018 2040   PCO2ART 26.0 (L) 03/10/2018 2040   PO2ART 380.0 (H) 03/10/2018 2040   HCO3 18.8 (L) 03/10/2018 2040   TCO2 20 (L) 03/10/2018 2040   ACIDBASEDEF 3.0 (H) 03/10/2018 2040   O2SAT 82.6 08/17/2018 0450      Coagulation Profile: Recent Labs  Lab 08/16/18 1611  INR 4.3*    Cardiac Enzymes: Recent Labs  Lab 08/16/18 1333  CKTOTAL 194  TROPONINI <0.03    HbA1C: Hgb A1c MFr Bld  Date/Time Value Ref Range Status  09/10/2008 12:40 PM  4.6 - 6.1 % Final   6.0 (NOTE) The ADA recommends the following therapeutic goal for glycemic control related to Hgb A1c measurement: Goal of therapy: <6.5 Hgb A1c  Reference: American Diabetes Association: Clinical Practice Recommendations 2010, Diabetes Care, 2010, 33: (Suppl  1).    CBG: Recent Labs  Lab 08/17/18 0249 08/17/18 0358 08/17/18 0456 08/17/18 0551 08/17/18 0644  GLUCAP 68* 78 85 87 87    Review of Systems:   Denies pain  Past Medical History  He,  has a past medical history of CHF (congestive heart failure) (HCC) and Hypertension.   Surgical History  Past Surgical History:  Procedure Laterality Date  . CARDIAC CATHETERIZATION N/A 04/30/2015   Procedure: Right/Left Heart Cath and Coronary Angiography;  Surgeon: Larey Dresser, MD;  Location: [facility];  Service: Cardiovascular;  Laterality: N/A;  . COLONOSCOPY WITH PROPOFOL N/A 05/01/2015   Procedure: COLONOSCOPY WITH PROPOFOL;  Surgeon: Wilford Corner, MD;  Location: [facility] ENDOSCOPY;  Service: Endoscopy;  Laterality: N/A;  . ESOPHAGOGASTRODUODENOSCOPY (EGD) WITH PROPOFOL N/A 05/01/2015   Procedure: ESOPHAGOGASTRODUODENOSCOPY (EGD) WITH PROPOFOL;  Surgeon: Wilford Corner, MD;  Location: [facility] ENDOSCOPY;  Service: Endoscopy;  Laterality: N/A;  . INGUINAL HERNIA REPAIR Left 03/24/2018   Procedure: HERNIA REPAIR INGUINAL INCARCERATED;  Surgeon: Georganna Skeans, MD;  Location: [facility];  Service: General;  Laterality: Left;  . INSERTION OF MESH Left 03/24/2018   Procedure: INSERTION OF MESH;  Surgeon: Georganna Skeans, MD;  Location: [facility];  Service: General;  Laterality: Left;  . SHOULDER SURGERY       Social History   reports that he has never smoked. He has never used  smokeless tobacco. He reports that he does not drink alcohol or use drugs.   Family History   His family history includes Lung cancer in his mother.   Allergies Allergies  Allergen Reactions  . Lisinopril Swelling    REACTION: pt had a swelling episode.  His lips swelled  . Penicillins Hives    Has patient had a PCN reaction causing immediate rash, facial/tongue/throat swelling, SOB or lightheadedness with hypotension: Yes Has patient had a PCN reaction causing severe rash involving mucus membranes or skin necrosis: No Has patient had a PCN reaction that required hospitalization: No Has patient had a PCN reaction occurring within the last 10 years: No If all of the above answers are "NO", then may proceed with Cephalosporin use.     Home Medications  Prior to Admission medications   Medication Sig Start Date End Date Taking? Authorizing Provider  metolazone (ZAROXOLYN) 2.5 MG tablet Take 1 tablet (2.5 mg total) by mouth daily. As directed by heart failure clinic 06/05/18 09/03/18 Yes Tillery, Satira Mccallum, PA-C  potassium chloride 20 MEQ TBCR Take 20 mEq by mouth 2 (two) times daily. 06/05/18  Yes Shirley Friar, PA-C  torsemide (DEMADEX) 20 MG tablet Take 2 tablets (40 mg total) by mouth 2 (two) times daily. 06/05/18  Yes Shirley Friar, PA-C  acetaminophen (TYLENOL) 500 MG tablet You can take 1000 mg every 8 hours as needed for pain.  Can alternate this with ibuprofen, or Tramadol.  Do not exceed 4000 mg of Tylenol(acetaminophen) per day it can harm your liver.  You can buy this over-the-counter at any drugstore. 03/28/18   Earnstine Regal, PA-C  docusate sodium (COLACE) 100 MG capsule Take 1 capsule (100 mg total) by mouth 2 (two) times daily. 03/20/18   Bonnielee Haff, MD  folic acid (FOLVITE) 1 MG tablet Take 1 tablet (1 mg total) by mouth daily. 03/20/18   Bonnielee Haff, MD  ibuprofen (ADVIL,MOTRIN) 200 MG tablet You can take 3 tablets every 6 hours as needed for  pain.  You can alternate this with plain Tylenol or Tramadol.  You can buy this over-the-counter at any drugstore without a prescription. 03/28/18   Earnstine Regal, PA-C  losartan (COZAAR) 25 MG tablet Take 0.5 tablets (12.5 mg total) by mouth at bedtime. 04/10/18   Georgiana Shore, NP  magnesium oxide (MAG-OX) 400 MG tablet Take 1 tablet (400 mg total) by mouth daily. 04/11/18   Lillia Mountain  M, NP  Multiple Vitamin (MULTIVITAMIN WITH MINERALS) TABS tablet Take 1 tablet by mouth daily. 03/20/18   Bonnielee Haff, MD  polyethylene glycol [facility] / Floria Raveling) packet You can use this for constipation as instructed on the package directions.  You can buy this at any drugstore without a prescription. Patient not taking: Reported on 08/16/2018 03/28/18   Earnstine Regal, PA-C  traMADol (ULTRAM) 50 MG tablet Take 1 tablet (50 mg total) by mouth every 6 (six) hours as needed for moderate pain (Third line use if pain not relieved by PO Tylenol and Ibuprofen). 03/28/18   Earnstine Regal, PA-C     Alphonzo Grieve, MD IMTS - PGY3 Pager [phone]

## 2018-08-17 NOTE — Progress Notes (Addendum)
Patient ID: Kyle Conrad, male   DOB: July 01, 1965, 53 y.o.   MRN: [id number]     Advanced Heart Failure Rounding Note  PCP-Cardiologist: No primary care provider on file.   Subjective:    Patient is awake and alert this morning.  He will follow commands but will not speak to me, appears confused.    MAP stable in 70s on norepinephrine 18.  He got a Lasix bolus yesterday with minimal urine output.  Lasix gtt ordered this morning. CVP > 20.    D5 gtt for hypoglycemia.   Creatinine 1.9 => 2.15.  WBCs not elevated, platelets falling at 44K (53K at admission).  PCT 6.76.  CXR with pulmonary edema + LLL infiltrate.   Tm 100.4, he is on vancomycin/cefepime.  Enterobacter species in blood.    Objective:   Weight Range: 104.5 kg Body mass index is 31.25 kg/m.   Vital Signs:   Temp:  [86.9 F (30.5 C)-100.4 F (38 C)] 99.3 F (37.4 C) (04/03 0700) Pulse Rate:  [59-145] 145 (04/02 1600) Resp:  [15-31] 23 (04/03 0700) BP: (63-127)/(32-111) 107/58 (04/03 0700) SpO2:  [94 %-99 %] 99 % (04/03 0100) Weight:  [90.4 kg-112 kg] 104.5 kg (04/03 0500) Last BM Date: 08/16/18  Weight change: Filed Weights   08/16/18 1312 08/16/18 1800 08/17/18 0500  Weight: 112 kg 90.4 kg 104.5 kg    Intake/Output:   Intake/Output Summary (Last 24 hours) at 08/17/2018 0825 Last data filed at 08/17/2018 0600 Gross per 24 hour  Intake 2308.54 ml  Output 50 ml  Net 2258.54 ml      Physical Exam    General:  Awake, NAD HEENT: Normal Neck: Supple. JVP 16+. Carotids 2+ bilat; no bruits. No lymphadenopathy or thyromegaly appreciated. Cor: PMI lateral. Regular rate & rhythm. +S3.  3/6 HSM LLSB Lungs: Crackles at bases.  Abdomen: Soft, nontender, mildly distended. No hepatosplenomegaly. No bruits or masses. Good bowel sounds. Extremities: No cyanosis, clubbing, rash. 2+ edema to thighs.  Neuro: Awake/alert, appears confused and not answering questions but will follow commands.    Telemetry   Sinus  tachy 100s, low voltage (personally reviewed)  Labs    CBC Recent Labs    08/16/18 1333 08/16/18 1611 08/17/18 0353  WBC 4.3 2.2* 4.9  NEUTROABS 1.9 2.1  --   HGB 12.5* 10.9* 10.8*  HCT 40.6 34.0* 34.5*  MCV 92.1 91.2 91.0  PLT 53* 45* 44*   Basic Metabolic Panel Recent Labs    08/16/18 1611 08/16/18 2250 08/17/18 0353  NA 141  --  140  K 4.0  --  4.4  CL 114*  --  112*  CO2 18*  --  17*  GLUCOSE 84 36* 75  BUN 46*  --  49*  CREATININE 1.83*  --  2.15*  CALCIUM 8.2*  --  8.2*  MG 1.7  --  1.8  PHOS 3.9  --  3.8   Liver Function Tests Recent Labs    08/16/18 1333 08/16/18 1611  AST 48* 54*  ALT 23 21  ALKPHOS 114 99  BILITOT 3.7* 3.7*  PROT 6.0* 5.2*  ALBUMIN 1.9* 1.7*   Recent Labs    08/16/18 1611  AMYLASE 28   Cardiac Enzymes Recent Labs    08/16/18 1333  CKTOTAL 194  TROPONINI <0.03    BNP: BNP (last 3 results) Recent Labs    03/24/18 1435 06/05/18 1411 08/16/18 1611  BNP 2,471.9* 3,961.4* 2,433.3*    ProBNP (last 3 results) No results  for input(s): PROBNP in the last 8760 hours.   D-Dimer Recent Labs    08/16/18 1611  DDIMER 3.83*   Hemoglobin A1C No results for input(s): HGBA1C in the last 72 hours. Fasting Lipid Panel No results for input(s): CHOL, HDL, LDLCALC, TRIG, CHOLHDL, LDLDIRECT in the last 72 hours. Thyroid Function Tests Recent Labs    08/16/18 1424 08/16/18 1643  TSH 5.578*  --   T3FREE  --  1.2*    Other results:   Imaging    Dg Chest Port 1 View  Result Date: 08/17/2018 CLINICAL DATA:  Shortness of breath, pneumonia EXAM: PORTABLE CHEST 1 VIEW COMPARISON:  08/16/2018 FINDINGS: Cardiomegaly, vascular congestion. Low lung volumes. Bibasilar atelectasis or infiltrates, increased since prior study. No effusions or acute bony abnormality. IMPRESSION: Cardiomegaly, vascular congestion. Bibasilar atelectasis or infiltrates, increasing since prior study. Electronically Signed   By: Rolm Baptise M.D.   On:  08/17/2018 07:24   Dg Chest Port 1 View  Result Date: 08/16/2018 CLINICAL DATA:  Central line placement. EXAM: PORTABLE CHEST 1 VIEW COMPARISON:  Chest x-ray from same day. FINDINGS: Interval placement of a right subclavian central venous catheter with the tip in the distal SVC. Stable cardiomegaly with unchanged pulmonary vascular congestion. No focal consolidation, pleural effusion, or pneumothorax. No acute osseous abnormality. IMPRESSION: 1. Right subclavian central venous catheter with tip in the distal SVC. No pneumothorax. 2. Stable cardiomegaly and pulmonary vascular congestion. Electronically Signed   By: Titus Dubin M.D.   On: 08/16/2018 18:08   Dg Chest Portable 1 View  Result Date: 08/16/2018 CLINICAL DATA:  Initial evaluation for acute shortness of breath. EXAM: PORTABLE CHEST 1 VIEW COMPARISON:  Prior radiograph from 03/24/2018. FINDINGS: Advanced cardiomegaly, similar to previous. Mediastinal silhouette within normal limits. Lungs mildly hypoinflated. Diffuse pulmonary vascular congestion with interstitial prominence, suggesting mild pulmonary interstitial congestion/edema. No definite pleural effusion. No consolidative opacity. No pneumothorax. No acute osseous finding. Prominent degenerative changes about the shoulders bilaterally. IMPRESSION: 1. Cardiomegaly with mild diffuse pulmonary interstitial congestion/edema. 2. No other active cardiopulmonary disease. Electronically Signed   By: Jeannine Boga M.D.   On: 08/16/2018 13:50   Dg Abd Portable 1 View  Result Date: 08/16/2018 CLINICAL DATA:  Initial evaluation for acute abdominal distension. EXAM: PORTABLE ABDOMEN - 1 VIEW COMPARISON:  Prior radiograph from 03/24/2018 FINDINGS: Visualized bowel gas pattern within normal limits without obstruction or ileus. Mild gaseous distension of the stomach. No abnormal bowel wall thickening. No appreciable free air on this limited single supine partial view of the abdomen. No soft tissue  mass or abnormal calcification. Visualized osseous structures demonstrate no acute finding. Osteoarthritic changes about the hips bilaterally. Degenerative changes noted within the lower lumbar spine. IMPRESSION: Nonobstructive bowel gas pattern with no radiographic evidence for acute intra-abdominal pathology. Electronically Signed   By: Jeannine Boga M.D.   On: 08/16/2018 13:54      Medications:     Scheduled Medications: . Chlorhexidine Gluconate Cloth  6 each Topical Daily  . famotidine (PEPCID) IV  20 mg Intravenous Q12H  . heparin  5,000 Units Subcutaneous Q8H  . hydrocortisone sodium succinate  50 mg Intravenous Q6H  . sodium chloride flush  10-40 mL Intracatheter Q12H     Infusions: . sodium chloride Stopped (08/16/18 2149)  . ceFEPime (MAXIPIME) IV    . dextrose 50 % 250 mL with Empty Containers Flexible 1 each infusion    . DOBUTamine    . furosemide (LASIX) infusion    . norepinephrine (  LEVOPHED) Adult infusion 18 mcg/min (08/17/18 0600)  . vancomycin       PRN Medications:  ondansetron (ZOFRAN) IV, sodium chloride flush   Assessment/Plan   1. 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  He is markedly volume overloaded with suspected mixed septic/cardiogenic shock.  Co-ox adequate today with MAP in 70s on norepinephrine 18 with CVP > 25.  Weight is up 80 lbs from his prior baseline after 10/19 hospitalization.  Poor UOP to Lasix bolus yesterday, creatinine up to 2.15.  Low volts compared to past noted on ECG.  - Continue norepinephrine support, will add dobutamine 2.5 and attempt to titrate down norepinephrine some.  - Lasix 20 mg/hr to start this morning.  I am concerned that he has had significant renal damage and may end up needing CVVH consideration. Poor candidate for long-term HD.  - He is not going to  be a candidate for advanced therapies.  LVAD likely not an option regardless with severe RV dysfunction and has had very poor compliance with medical therapy.  - Echo today, assess for pericardial effusion given low voltage on ECG/telemetry.  2. ID: Suspect component of septic shock.  Tm 100.4 with PCT 6.76.  Possible LLL PNA on CXR.  Enterobacter in blood.  - Continue empiric abx per CCM.  - Support MAP with NE and dobutamine.  3. AKI: Concern for ATN in setting of shock.  Was down at home for an undetermined length of time.  Very poor UOP.  Will likely need renal involvement/consideration of CVVH.  - Renal US done today.  - Lasix gtt as above to stimulate UOP and support BP.  4. Thrombocytopenia: Noted at admission, normal plts in the past.  Suspect related to sepsis.  5. Hypoglyemia: D5 gtt.  6. Neuro: Confusion/delirium, likely in setting of critical illness/infection.  7. DVT prophylaxis: heparin [place].   Prognosis long-term is poor.   CRITICAL CARE Performed by: Loralie Champagne  Total critical care time: 35 minutes  Critical care time was exclusive of separately billable procedures and treating other patients.  Critical care was necessary to treat or prevent imminent or life-threatening deterioration.  Critical care was time spent personally by me on the following activities: development of treatment plan with patient and/or surrogate as well as nursing, discussions with consultants, evaluation of patient's response to treatment, examination of patient, obtaining history from patient or surrogate, ordering and performing treatments and interventions, ordering and review of laboratory studies, ordering and review of radiographic studies, pulse oximetry and re-evaluation of patient's condition.   Length of Stay: 1  Loralie Champagne, MD  08/17/2018, 8:25 AM  Advanced Heart Failure Team Pager [phone] (M-F; 7a - 4p)  Please contact [facility] for night-coverage after hours (4p -7a )  and weekends on [email or web address]  Minimal response to Lasix/Diuril, agree with proceeding to CVVH.  Dr. Jimmy Footman has seen, get dialysis catheter.   Echo reviewed: EF around 20% with severe LV dilation, severe RV dilation/severe systolic dysfunction, severe TR, moderate-severe MR. There is a small pericardial effusion, no tamponade.   Loralie Champagne 08/17/2018

## 2018-08-17 NOTE — Plan of Care (Signed)
  Problem: Clinical Measurements: Goal: Respiratory complications will improve Outcome: Progressing  Pt remains extubated on O2 via [place] Problem: Clinical Measurements: Goal: Cardiovascular complication will be avoided Outcome: Not Progressing  Cardiovascular status maintained with levophed Problem: Pain Managment: Goal: General experience of comfort will improve Outcome: Progressing  Pt denies pain

## 2018-08-18 ENCOUNTER — Inpatient Hospital Stay (HOSPITAL_COMMUNITY): Payer: Medicaid Other

## 2018-08-18 LAB — COOXEMETRY PANEL
Carboxyhemoglobin: 1.7 % — ABNORMAL HIGH (ref 0.5–1.5)
Methemoglobin: 1.1 % (ref 0.0–1.5)
O2 Saturation: 76.4 %
Total hemoglobin: 11 g/dL — ABNORMAL LOW (ref 12.0–16.0)

## 2018-08-18 LAB — COMPREHENSIVE METABOLIC PANEL
ALT: 20 U/L (ref 0–44)
AST: 37 U/L (ref 15–41)
Albumin: 1.4 g/dL — ABNORMAL LOW (ref 3.5–5.0)
Alkaline Phosphatase: 64 U/L (ref 38–126)
Anion gap: 6 (ref 5–15)
BUN: 41 mg/dL — ABNORMAL HIGH (ref 6–20)
CO2: 22 mmol/L (ref 22–32)
Calcium: 7.8 mg/dL — ABNORMAL LOW (ref 8.9–10.3)
Chloride: 112 mmol/L — ABNORMAL HIGH (ref 98–111)
Creatinine, Ser: 1.71 mg/dL — ABNORMAL HIGH (ref 0.61–1.24)
GFR calc Af Amer: 51 mL/min — ABNORMAL LOW (ref >60)
GFR calc non Af Amer: 44 mL/min — ABNORMAL LOW (ref >60)
Glucose, Bld: 81 mg/dL (ref 70–99)
Potassium: 4.2 mmol/L (ref 3.5–5.1)
Sodium: 140 mmol/L (ref 135–145)
Total Bilirubin: 4.7 mg/dL — ABNORMAL HIGH (ref 0.3–1.2)
Total Protein: 5.1 g/dL — ABNORMAL LOW (ref 6.5–8.1)

## 2018-08-18 LAB — CBC
HCT: 32.4 % — ABNORMAL LOW (ref 39.0–52.0)
Hemoglobin: 10.3 g/dL — ABNORMAL LOW (ref 13.0–17.0)
MCH: 28.1 pg (ref 26.0–34.0)
MCHC: 31.8 g/dL (ref 30.0–36.0)
MCV: 88.5 fL (ref 80.0–100.0)
Platelets: 14 10*3/uL — CL (ref 150–400)
RBC: 3.66 MIL/uL — ABNORMAL LOW (ref 4.22–5.81)
RDW: 17.6 % — ABNORMAL HIGH (ref 11.5–15.5)
WBC: 6.3 10*3/uL (ref 4.0–10.5)
nRBC: 0.8 % — ABNORMAL HIGH (ref 0.0–0.2)

## 2018-08-18 LAB — GLUCOSE, CAPILLARY
Glucose-Capillary: 100 mg/dL — ABNORMAL HIGH (ref 70–99)
Glucose-Capillary: 101 mg/dL — ABNORMAL HIGH (ref 70–99)
Glucose-Capillary: 115 mg/dL — ABNORMAL HIGH (ref 70–99)
Glucose-Capillary: 124 mg/dL — ABNORMAL HIGH (ref 70–99)
Glucose-Capillary: 56 mg/dL — ABNORMAL LOW (ref 70–99)
Glucose-Capillary: 69 mg/dL — ABNORMAL LOW (ref 70–99)
Glucose-Capillary: 72 mg/dL (ref 70–99)
Glucose-Capillary: 73 mg/dL (ref 70–99)
Glucose-Capillary: 75 mg/dL (ref 70–99)
Glucose-Capillary: 75 mg/dL (ref 70–99)
Glucose-Capillary: 76 mg/dL (ref 70–99)
Glucose-Capillary: 76 mg/dL (ref 70–99)
Glucose-Capillary: 77 mg/dL (ref 70–99)
Glucose-Capillary: 78 mg/dL (ref 70–99)
Glucose-Capillary: 80 mg/dL (ref 70–99)
Glucose-Capillary: 83 mg/dL (ref 70–99)
Glucose-Capillary: 85 mg/dL (ref 70–99)
Glucose-Capillary: 87 mg/dL (ref 70–99)
Glucose-Capillary: 89 mg/dL (ref 70–99)
Glucose-Capillary: 90 mg/dL (ref 70–99)
Glucose-Capillary: 91 mg/dL (ref 70–99)

## 2018-08-18 LAB — CBC WITH DIFFERENTIAL/PLATELET
Abs Immature Granulocytes: 0.06 10*3/uL (ref 0.00–0.07)
Basophils Absolute: 0 10*3/uL (ref 0.0–0.1)
Basophils Relative: 0 %
Eosinophils Absolute: 0 10*3/uL (ref 0.0–0.5)
Eosinophils Relative: 0 %
HCT: 29.9 % — ABNORMAL LOW (ref 39.0–52.0)
Hemoglobin: 9.9 g/dL — ABNORMAL LOW (ref 13.0–17.0)
Immature Granulocytes: 1 %
Lymphocytes Relative: 3 %
Lymphs Abs: 0.3 10*3/uL — ABNORMAL LOW (ref 0.7–4.0)
MCH: 29.5 pg (ref 26.0–34.0)
MCHC: 33.1 g/dL (ref 30.0–36.0)
MCV: 89 fL (ref 80.0–100.0)
Monocytes Absolute: 0.2 10*3/uL (ref 0.1–1.0)
Monocytes Relative: 3 %
Neutro Abs: 7.6 10*3/uL (ref 1.7–7.7)
Neutrophils Relative %: 93 %
Platelets: 8 10*3/uL — CL (ref 150–400)
RBC: 3.36 MIL/uL — ABNORMAL LOW (ref 4.22–5.81)
RDW: 17.7 % — ABNORMAL HIGH (ref 11.5–15.5)
WBC: 8.1 10*3/uL (ref 4.0–10.5)
nRBC: 0.5 % — ABNORMAL HIGH (ref 0.0–0.2)

## 2018-08-18 LAB — RENAL FUNCTION PANEL
Albumin: 1.3 g/dL — ABNORMAL LOW (ref 3.5–5.0)
Anion gap: 5 (ref 5–15)
BUN: 31 mg/dL — ABNORMAL HIGH (ref 6–20)
CO2: 25 mmol/L (ref 22–32)
Calcium: 7.7 mg/dL — ABNORMAL LOW (ref 8.9–10.3)
Chloride: 108 mmol/L (ref 98–111)
Creatinine, Ser: 1.39 mg/dL — ABNORMAL HIGH (ref 0.61–1.24)
GFR calc Af Amer: >60 mL/min (ref >60)
GFR calc non Af Amer: 57 mL/min — ABNORMAL LOW (ref >60)
Glucose, Bld: 97 mg/dL (ref 70–99)
Phosphorus: 2.8 mg/dL (ref 2.5–4.6)
Potassium: 4.1 mmol/L (ref 3.5–5.1)
Sodium: 138 mmol/L (ref 135–145)

## 2018-08-18 LAB — APTT
aPTT: 57 seconds — ABNORMAL HIGH (ref 24–36)
aPTT: 63 seconds — ABNORMAL HIGH (ref 24–36)

## 2018-08-18 LAB — HCV COMMENT:

## 2018-08-18 LAB — HEPATITIS B SURFACE ANTIGEN: Hepatitis B Surface Ag: NEGATIVE

## 2018-08-18 LAB — HEPATITIS B SURFACE ANTIBODY,QUALITATIVE: Hep B S Ab: NONREACTIVE

## 2018-08-18 LAB — LEGIONELLA PNEUMOPHILA SEROGP 1 UR AG: L. pneumophila Serogp 1 Ur Ag: NEGATIVE

## 2018-08-18 LAB — HEPATITIS C ANTIBODY (REFLEX): HCV Ab: 0.1 s/co ratio (ref 0.0–0.9)

## 2018-08-18 LAB — MAGNESIUM: Magnesium: 2.2 mg/dL (ref 1.7–2.4)

## 2018-08-18 MED ORDER — HEPARIN SODIUM (PORCINE) 5000 UNIT/ML IJ SOLN
1400.0000 [IU] | Freq: Once | INTRAMUSCULAR | Status: DC
  Filled 2018-08-18: qty 1

## 2018-08-18 MED ORDER — HEPARIN SODIUM (PORCINE) 1000 UNIT/ML DIALYSIS
1400.0000 [IU] | Freq: Once | INTRAMUSCULAR | Status: AC
  Administered 2018-08-18: 1400 [IU] via INTRAVENOUS_CENTRAL
  Filled 2018-08-18: qty 2

## 2018-08-18 MED ORDER — THIAMINE HCL 100 MG/ML IJ SOLN
100.0000 mg | Freq: Every day | INTRAMUSCULAR | Status: DC
  Administered 2018-08-18 – 2018-08-20 (×3): 100 mg via INTRAVENOUS
  Filled 2018-08-18 (×3): qty 2

## 2018-08-18 MED ORDER — FUROSEMIDE 10 MG/ML IJ SOLN
160.0000 mg | Freq: Four times a day (QID) | INTRAVENOUS | Status: DC
  Administered 2018-08-18 – 2018-08-19 (×5): 160 mg via INTRAVENOUS
  Filled 2018-08-18: qty 10
  Filled 2018-08-18 (×5): qty 16
  Filled 2018-08-18: qty 10
  Filled 2018-08-18: qty 16

## 2018-08-18 MED ORDER — DEXTROSE 10 % IV SOLN
INTRAVENOUS | Status: DC
  Administered 2018-08-18: 13:00:00 50 mL/h via INTRAVENOUS
  Administered 2018-08-18 (×2): via INTRAVENOUS
  Administered 2018-08-19: 25 mL/h via INTRAVENOUS
  Administered 2018-08-20 – 2018-08-21 (×2): via INTRAVENOUS

## 2018-08-18 MED ORDER — DEXTROSE 50 % IV SOLN
INTRAVENOUS | Status: AC
  Administered 2018-08-18: 25 mL
  Filled 2018-08-18: qty 50

## 2018-08-18 MED ORDER — PANTOPRAZOLE SODIUM 40 MG IV SOLR
40.0000 mg | Freq: Every day | INTRAVENOUS | Status: DC
  Administered 2018-08-18 – 2018-08-21 (×4): 40 mg via INTRAVENOUS
  Filled 2018-08-18 (×4): qty 40

## 2018-08-18 NOTE — Progress Notes (Signed)
CRITICAL VALUE ALERT  Critical Value:  Platelets 14  Date & Time Notied:  Today, now   Provider Notified: Elink RN  Orders Received/Actions taken: pending

## 2018-08-18 NOTE — Progress Notes (Signed)
NAME:  Kyle Conrad, MRN:  [id number], DOB:  28-Jul-1965, LOS: 2 ADMISSION DATE:  08/16/2018, CONSULTATION DATE: 08/16/2018 REFERRING MD: [facility] ED, CHIEF COMPLAINT: Altered mental status  HPI/course in hospital  53 year old man with known biventricular failure requiring short-term inotropes for exacerbations. Found down at home and confused. Hypoglycemic on arrival but markedly edematous. Deemed to be in mixed cardiogenic and distributive shock from a known heart failure (elevated BNP) and gram-negative bacteremia. Started on CRRT for ultrafiltration 4/4.  Past Medical History   Past Medical History:  Diagnosis Date   CHF (congestive heart failure) ([place])    Hypertension      Past Surgical History:  Procedure Laterality Date   CARDIAC CATHETERIZATION N/A 04/30/2015   Procedure: Right/Left Heart Cath and Coronary Angiography;  Surgeon: Larey Dresser, MD;  Location: [place] CV LAB;  Service: Cardiovascular;  Laterality: N/A;   COLONOSCOPY WITH PROPOFOL N/A 05/01/2015   Procedure: COLONOSCOPY WITH PROPOFOL;  Surgeon: Wilford Corner, MD;  Location: [facility] ENDOSCOPY;  Service: Endoscopy;  Laterality: N/A;   ESOPHAGOGASTRODUODENOSCOPY (EGD) WITH PROPOFOL N/A 05/01/2015   Procedure: ESOPHAGOGASTRODUODENOSCOPY (EGD) WITH PROPOFOL;  Surgeon: Wilford Corner, MD;  Location: [facility] ENDOSCOPY;  Service: Endoscopy;  Laterality: N/A;   INGUINAL HERNIA REPAIR Left 03/24/2018   Procedure: HERNIA REPAIR INGUINAL INCARCERATED;  Surgeon: Georganna Skeans, MD;  Location: [place];  Service: General;  Laterality: Left;   INSERTION OF MESH Left 03/24/2018   Procedure: INSERTION OF MESH;  Surgeon: Georganna Skeans, MD;  Location: [place];  Service: General;  Laterality: Left;   SHOULDER SURGERY        Interim history/subjective:  Has tolerated increase in ultrafiltration rate.  Appears less distressed still unable to phonate although able to mouth words appropriately.  Objective   Blood pressure  100/77, pulse 87, temperature (!) 94.7 F (34.8 C), temperature source Rectal, resp. rate 12, height 6' (1.829 m), weight 100.6 kg, SpO2 99 %. CVP:  [9 mmHg-19 mmHg] 9 mmHg      Intake/Output Summary (Last 24 hours) at 08/18/2018 1403 Last data filed at 08/18/2018 1300 Gross per 24 hour  Intake 1579.9 ml  Output 4608 ml  Net -3028.1 ml   Filed Weights   08/16/18 1800 08/17/18 0500 08/18/18 0500  Weight: 90.4 kg 104.5 kg 100.6 kg    Examination: Physical Exam Constitutional:      Appearance: He is not ill-appearing.     Comments: Edematous but with low muscle mass  HENT:     Head: Normocephalic and atraumatic.     Nose: Nose normal.     Mouth/Throat:     Mouth: Mucous membranes are dry.  Eyes:     Extraocular Movements: Extraocular movements intact.     Conjunctiva/sclera: Conjunctivae normal.  Cardiovascular:     Heart sounds: Normal heart sounds. No murmur. No gallop.      Comments: JVP remains elevated Pulmonary:     Effort: Pulmonary effort is normal.     Breath sounds: Normal breath sounds. No rales.  Abdominal:     Palpations: Abdomen is soft.  Musculoskeletal:        General: Swelling present.     Comments: Marked anasarca  Skin:    Capillary Refill: Capillary refill takes more than 3 seconds.  Neurological:     General: No focal deficit present.     Mental Status: He is disoriented.      Ancillary tests (personally reviewed)  CBC: Recent Labs  Lab 08/16/18 1333 08/16/18 1611 08/17/18 0353 08/17/18  0912 08/18/18 0207  WBC 4.3 2.2* 4.9  --  6.3  NEUTROABS 1.9 2.1  --   --   --   HGB 12.5* 10.9* 10.8* 12.2* 10.3*  HCT 40.6 34.0* 34.5* 36.0* 32.4*  MCV 92.1 91.2 91.0  --  88.5  PLT 53* 45* 44*  --  14*    Basic Metabolic Panel: Recent Labs  Lab 08/16/18 1611 08/16/18 2250 08/17/18 0353 08/17/18 0912 08/17/18 1334 08/17/18 1755 08/18/18 0207  NA 141  --  140 143 138 139 140  K 4.0  --  4.4 4.6 4.5 4.2 4.2  CL 114*  --  112*  --  111 110 112*   CO2 18*  --  17*  --  19* 19* 22  GLUCOSE 84 36* 75  --  143* 146* 81  BUN 46*  --  49*  --  53* 48* 41*  CREATININE 1.83*  --  2.15*  --  2.23* 1.99* 1.71*  CALCIUM 8.2*  --  8.2*  --  7.6* 7.8* 7.8*  MG 1.7  --  1.8  --   --   --  2.2  PHOS 3.9  --  3.8  --   --  4.3  --    GFR: Estimated Creatinine Clearance: 60.6 mL/min (A) (by C-G formula based on SCr of 1.71 mg/dL (H)). Recent Labs  Lab 08/16/18 1333 08/16/18 1611 08/16/18 1645 08/16/18 2227 08/17/18 0353 08/18/18 0207  PROCALCITON  --  6.76  --   --   --   --   WBC 4.3 2.2*  --   --  4.9 6.3  LATICACIDVEN 3.1*  --  4.3* 4.9*  --   --     Liver Function Tests: Recent Labs  Lab 08/16/18 1333 08/16/18 1611 08/17/18 1755 08/18/18 0207  AST 48* 54*  --  37  ALT 23 21  --  20  ALKPHOS 114 99  --  64  BILITOT 3.7* 3.7*  --  4.7*  PROT 6.0* 5.2*  --  5.1*  ALBUMIN 1.9* 1.7* 1.4* 1.4*   Recent Labs  Lab 08/16/18 1611  AMYLASE 28   Recent Labs  Lab 08/16/18 1333 08/17/18 0923  AMMONIA 20 35    ABG    Component Value Date/Time   PHART 7.383 08/17/2018 0912   PCO2ART 28.1 (L) 08/17/2018 0912   PO2ART 79.0 (L) 08/17/2018 0912   HCO3 16.7 (L) 08/17/2018 0912   TCO2 18 (L) 08/17/2018 0912   ACIDBASEDEF 7.0 (H) 08/17/2018 0912   O2SAT 76.4 08/18/2018 0402     Coagulation Profile: Recent Labs  Lab 08/16/18 1611 08/17/18 0927  INR 4.3* 3.8*    Cardiac Enzymes: Recent Labs  Lab 08/16/18 1333  CKTOTAL 194  TROPONINI <0.03    HbA1C: Hgb A1c MFr Bld  Date/Time Value Ref Range Status  09/10/2008 12:40 PM  4.6 - 6.1 % Final   6.0 (NOTE) The ADA recommends the following therapeutic goal for glycemic control related to Hgb A1c measurement: Goal of therapy: <6.5 Hgb A1c  Reference: American Diabetes Association: Clinical Practice Recommendations 2010, Diabetes Care, 2010, 33: (Suppl  1).    CBG: Recent Labs  Lab 08/18/18 0627 08/18/18 0645 08/18/18 0805 08/18/18 0953 08/18/18 1126  GLUCAP  69* 101* 87 80 78   CT head: Premature atrophy with small vessel changes but no acute process.  Echocardiogram: Severe biventricular failure with no significant valvular abnormalities  Assessment & Plan:  Critically ill due to acute decompensated  systolic heart failure with superimposed distributive shock requiring titration of inotropes and vasopressors. Distributive component appears to be improving as patient weaning off norepinephrine and has tolerated increase in dobutamine. Acute on chronic kidney injury due to cardiorenal syndrome type I, tolerating fluid removal with CRRT. Unexplained aphonia and apparent hypoactive delirium. Recurrent hypoglycemia due to malnutrition, may account for encephalopathy.  PLAN.  Continue to titrate Levophed to off as tolerated Continue dobutamine at current dose Optimize fluid removal to maximum 200 mL/h net Estimate 20+ liters volume overload Start multivitamin and thiamine supplementation Insert core track feeding tube Continue to monitor mental status should improve over time Continue glucose infusion until feeding initiated.  Best practice:  Diet: Insert core track Pain/Anxiety/Delirium protocol (if indicated): None required VAP protocol (if indicated): Not applicable DVT prophylaxis: Remains coagulopathic GI prophylaxis: Not required Urinary catheter: Guide hemodynamic management Glucose control: Hypoglycemia requiring D10 infusion Mobility: Bedrest Code Status: Full code Family Communication: No family present Disposition: ICU level of care.   Critical care time: 45 min including chart data review, examination of patient, multidisciplinary rounds, and frequent assessment and modification of fluid removal, inotropic and vasopressor infusion adjustment.     Kipp Brood, MD [facility] ICU Physician [place]  Pager: [phone] Mobile: [phone] After hours: [phone].  08/18/2018, 2:03 PM

## 2018-08-18 NOTE — Progress Notes (Signed)
BGL 56 for 0500 check. 1/2amp D50 given, recheck 57mins later & BGL 83. Dextrose 10% at 33mL/hr still running. Called Elink RN to inform about sugar drop while on drip. Michela Pitcher will call back if new orders arise. Pt asymptomatic & VSS.

## 2018-08-18 NOTE — Progress Notes (Signed)
Patient ID: Kyle Conrad, male   DOB: 03-02-1966, 53 y.o.   MRN: [id number]     Advanced Heart Failure Rounding Note  PCP-Cardiologist: No primary care provider on file.   Subjective:    Patient remains awake and will follo commands but can no longer speak, appears confused.    SBP 100-105 on low-dose NE and dobutamine 4 (increased by CCM). CVVHD started yesterday. Pulling about 200/hr. Weight down 9 pounds. Remains on D10 at 50/hr. CBGs 70-80s. Co-ox 76%  Still receiving IV lasix  Platelets down to 14K.  Tm 98.6, he is on vancomycin/cefepime.  Enterobacter species in blood.    Objective:   Weight Range: 100.6 kg Body mass index is 30.08 kg/m.   Vital Signs:   Temp:  [91.5 F (33.1 C)-98.8 F (37.1 C)] 94.7 F (34.8 C) (04/04 0903) Pulse Rate:  [69-102] 95 (04/04 0935) Resp:  [11-35] 16 (04/04 0935) BP: (76-124)/(49-97) 96/61 (04/04 0930) SpO2:  [94 %-100 %] 95 % (04/04 0935) Weight:  [100.6 kg] 100.6 kg (04/04 0500) Last BM Date: 08/18/18  Weight change: Filed Weights   08/16/18 1800 08/17/18 0500 08/18/18 0500  Weight: 90.4 kg 104.5 kg 100.6 kg    Intake/Output:   Intake/Output Summary (Last 24 hours) at 08/18/2018 0942 Last data filed at 08/18/2018 0900 Gross per 24 hour  Intake 1729.94 ml  Output 4884 ml  Net -3154.06 ml      Physical Exam    General:  Ill appearing. No resp difficulty HEENT: normal Neck: supple. JVP to jaw Carotids 2+ bilat; no bruits. No lymphadenopathy or thryomegaly appreciated. Cor: PMI laterally displaced. Regular rate & rhythm. +s3 Lungs: clear Abdomen: soft, nontender, + distended. Extremities: no cyanosis, clubbing, rash,4+ edema with wounds Neuro: alert follows commands. Cannot speak     Telemetry   Sinus 90-100s, low voltage (personally reviewed)  Labs    CBC Recent Labs    08/16/18 1333 08/16/18 1611 08/17/18 0353 08/17/18 0912 08/18/18 0207  WBC 4.3 2.2* 4.9  --  6.3  NEUTROABS 1.9 2.1  --   --   --    HGB 12.5* 10.9* 10.8* 12.2* 10.3*  HCT 40.6 34.0* 34.5* 36.0* 32.4*  MCV 92.1 91.2 91.0  --  88.5  PLT 53* 45* 44*  --  14*   Basic Metabolic Panel Recent Labs    08/17/18 0353  08/17/18 1755 08/18/18 0207  NA 140   < > 139 140  K 4.4   < > 4.2 4.2  CL 112*   < > 110 112*  CO2 17*   < > 19* 22  GLUCOSE 75   < > 146* 81  BUN 49*   < > 48* 41*  CREATININE 2.15*   < > 1.99* 1.71*  CALCIUM 8.2*   < > 7.8* 7.8*  MG 1.8  --   --  2.2  PHOS 3.8  --  4.3  --    < > = values in this interval not displayed.   Liver Function Tests Recent Labs    08/16/18 1611 08/17/18 1755 08/18/18 0207  AST 54*  --  37  ALT 21  --  20  ALKPHOS 99  --  64  BILITOT 3.7*  --  4.7*  PROT 5.2*  --  5.1*  ALBUMIN 1.7* 1.4* 1.4*   Recent Labs    08/16/18 1611  AMYLASE 28   Cardiac Enzymes Recent Labs    08/16/18 1333  CKTOTAL 194  TROPONINI <0.03  BNP: BNP (last 3 results) Recent Labs    03/24/18 1435 06/05/18 1411 08/16/18 1611  BNP 2,471.9* 3,961.4* 2,433.3*    ProBNP (last 3 results) No results for input(s): PROBNP in the last 8760 hours.   D-Dimer Recent Labs    08/16/18 1611  DDIMER 3.83*   Hemoglobin A1C No results for input(s): HGBA1C in the last 72 hours. Fasting Lipid Panel No results for input(s): CHOL, HDL, LDLCALC, TRIG, CHOLHDL, LDLDIRECT in the last 72 hours. Thyroid Function Tests Recent Labs    08/16/18 1424 08/16/18 1643  TSH 5.578*  --   T3FREE  --  1.2*    Other results:   Imaging    No results found.   Medications:     Scheduled Medications: . chlorhexidine  15 mL Mouth Rinse BID  . Chlorhexidine Gluconate Cloth  6 each Topical Daily  . hydrocortisone sodium succinate  50 mg Intravenous Q6H  . mouth rinse  15 mL Mouth Rinse q12n4p  . pantoprazole (PROTONIX) IV  40 mg Intravenous Q0600  . sodium chloride flush  10-40 mL Intracatheter Q12H    Infusions: .  prismasol BGK 4/2.5 500 mL/hr at 08/18/18 0203  .  prismasol BGK 4/2.5  500 mL/hr at 08/18/18 0153  . sodium chloride Stopped (08/18/18 0747)  . sodium chloride    . ceFEPime (MAXIPIME) IV 2 g (08/18/18 0920)  . dextrose 50 mL/hr at 08/18/18 0800  . DOBUTamine 4 mcg/kg/min (08/18/18 0825)  . norepinephrine (LEVOPHED) Adult infusion 10 mcg/min (08/18/18 0807)  . prismasol BGK 4/2.5 2,000 mL/hr (08/18/18 0930)    PRN Medications: Place/Maintain arterial line **AND** sodium chloride, lip balm, ondansetron (ZOFRAN) IV, sodium chloride, sodium chloride flush   Assessment/Plan   1. 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  He is markedly volume overloaded with suspected mixed septic/cardiogenic shock.  Co-ox adequate today with MAP in 70s on norepinephrine 18 with CVP > 25.  Weight is up 80 lbs from his prior baseline after 10/19 hospitalization.    Low volts compared to past noted on ECG.  - Echo 4/3 with severe biventricualr failure. LVEF 20-25% small effusion. Moderate to severe MR - Now on NE and dobutamine 4. Co-ox 76% - He is massively volume overloaded. ~ 90 pounds. Volume removal per CVVHD. Can stop IV lasix (unless Renal feels otherwise) - He is not going to be a candidate for advanced therapies.  LVAD likely not an option regardless with severe RV dysfunction and has had very poor compliance with medical therapy.  2. ID: Suspect component of septic shock PCT 6.76.  Possible LLL PNA on CXR.  Enterobacter in blood.  - Continue empiric abx per CCM.  - Support MAP with NE and dobutamine.  3. AKI: Concern for ATN in setting of shock.  Was down at home for an undetermined length of time.  Now on CVVHD - Continue CVVHD 4. Thrombocytopenia: Noted at admission, normal plts in the past.  Suspect related to sepsis.  - Platelets 14k. No bleeding. Will follow  5. Hypoglyemia: D10 gtt per CCM.  6. Neuro:  Confusion/delirium, likely in setting of critical illness/infection. Now also with aphonia - will get head CT 7. DVT prophylaxis: heparin .   Prognosis long-term is poor.   CRITICAL CARE Performed by: Glori Bickers  Total critical care time: 45 minutes  Critical care time was exclusive of separately  billable procedures and treating other patients.  Critical care was necessary to treat or prevent imminent or life-threatening deterioration.  Critical care was time spent personally by me on the following activities: development of treatment plan with patient and/or surrogate as well as nursing, discussions with consultants, evaluation of patient's response to treatment, examination of patient, obtaining history from patient or surrogate, ordering and performing treatments and interventions, ordering and review of laboratory studies, ordering and review of radiographic studies, pulse oximetry and re-evaluation of patient's condition.   Length of Stay: 2  Glori Bickers, MD  08/18/2018, 9:42 AM  Advanced Heart Failure Team Pager [phone] (M-F; 7a - 4p)  Please contact [facility] for night-coverage after hours (4p -7a ) and weekends on [email or web address]

## 2018-08-18 NOTE — Progress Notes (Signed)
BGL 69 @0630  (waited an hr after D10% gtt increased to 60mL/hr). Gave 1/2 amp D50. 59mins later recheck w/ BGL 101. Pt asymptomatic & VSS.

## 2018-08-18 NOTE — Evaluation (Signed)
Clinical/Bedside Swallow Evaluation Patient Details  Name: Karen Kinnard MRN: [id number] Date of Birth: 1966/02/15  Today's Date: 08/18/2018 Time: SLP Start Time (ACUTE ONLY): 0946 SLP Stop Time (ACUTE ONLY): 0957 SLP Time Calculation (min) (ACUTE ONLY): 11 min  Past Medical History:  Past Medical History:  Diagnosis Date  . CHF (congestive heart failure) ([place])   . Hypertension    Past Surgical History:  Past Surgical History:  Procedure Laterality Date  . CARDIAC CATHETERIZATION N/A 04/30/2015   Procedure: Right/Left Heart Cath and Coronary Angiography;  Surgeon: Larey Dresser, MD;  Location: [place] [facility];  Service: Cardiovascular;  Laterality: N/A;  . COLONOSCOPY WITH PROPOFOL N/A 05/01/2015   Procedure: COLONOSCOPY WITH PROPOFOL;  Surgeon: Wilford Corner, MD;  Location: [facility] ENDOSCOPY;  Service: Endoscopy;  Laterality: N/A;  . ESOPHAGOGASTRODUODENOSCOPY (EGD) WITH PROPOFOL N/A 05/01/2015   Procedure: ESOPHAGOGASTRODUODENOSCOPY (EGD) WITH PROPOFOL;  Surgeon: Wilford Corner, MD;  Location: [facility] ENDOSCOPY;  Service: Endoscopy;  Laterality: N/A;  . INGUINAL HERNIA REPAIR Left 03/24/2018   Procedure: HERNIA REPAIR INGUINAL INCARCERATED;  Surgeon: Georganna Skeans, MD;  Location: [place];  Service: General;  Laterality: Left;  . INSERTION OF MESH Left 03/24/2018   Procedure: INSERTION OF MESH;  Surgeon: Georganna Skeans, MD;  Location: [place];  Service: General;  Laterality: Left;  . SHOULDER SURGERY     HPI:  53 year old man who remains critically ill due to mixed distributive and cardiogenic shock. Evolving AKI due to cardiorenal syndrome. Enterobacter bacteremia. Hypoglycemia due to malnutrition. Cardiomegaly, vascular congestion with bibasilar infiltrates. Pt noted to have difficulty talking, CT head ordered   Assessment / Plan / Recommendation Clinical Impression  Pt demonstrates impaired respiratory function and severe deconditioning, but no signs of dysphagia or aspiration  during assessment. Oral motor function WNL, pt able to articulate appropriately in response to orinetation questions, but does not phonate and breath support is significantly low. Follows verbal only commands well. Cough elicits phonation, but is weak and congested. To challenge pt and evaluate for potential for silent aspiration, Yale swallow administered successfully without cough response. Pt consume in total over 8 oz of liquid, mastication WNL. Will f/u for tolerance given other risk factors, but recommend pt initiate a regular diet with thin liquids at this time.  SLP Visit Diagnosis: Dysphagia, unspecified (R13.10)    Aspiration Risk  Moderate aspiration risk    Diet Recommendation Regular;Thin liquid   Liquid Administration via: Cup;Straw Medication Administration: Whole meds with liquid Supervision: Staff to assist with self feeding;Full supervision/cueing for compensatory strategies Compensations: Slow rate;Small sips/bites Postural Changes: Seated upright at 90 degrees    Other  Recommendations Oral Care Recommendations: Oral care BID   Follow up Recommendations Skilled Nursing facility      Frequency and Duration min 2x/week  1 week       Prognosis Prognosis for Safe Diet Advancement: Good Barriers to Reach Goals: Severity of deficits      Swallow Study   General HPI: 53 year old man who remains critically ill due to mixed distributive and cardiogenic shock. Evolving AKI due to cardiorenal syndrome. Enterobacter bacteremia. Hypoglycemia due to malnutrition. Cardiomegaly, vascular congestion with bibasilar infiltrates. Pt noted to have difficulty talking, CT head ordered Type of Study: Bedside Swallow Evaluation Previous Swallow Assessment: none Diet Prior to this Study: NPO Temperature Spikes Noted: No Respiratory Status: Room air History of Recent Intubation: No Behavior/Cognition: Alert;Cooperative;Pleasant mood Oral Cavity Assessment: Within Functional  Limits Oral Care Completed by SLP: No Oral Cavity -  Dentition: Adequate natural dentition Vision: Functional for self-feeding Self-Feeding Abilities: Needs assist Patient Positioning: Upright in bed Baseline Vocal Quality: Aphonic;Low vocal intensity Volitional Cough: Weak;Congested    Oral/Motor/Sensory Function Overall Oral Motor/Sensory Function: Within functional limits   Ice Chips Ice chips: Within functional limits   Thin Liquid Thin Liquid: Within functional limits Presentation: Cup;Straw    Nectar Thick Nectar Thick Liquid: Not tested   Honey Thick Honey Thick Liquid: Not tested   Puree Puree: Within functional limits Presentation: Spoon   Solid     Solid: Within functional limits     Herbie [place], MA [place] Pager [phone] Office [phone]  Lynann Beaver 08/18/2018,10:07 AM

## 2018-08-18 NOTE — Progress Notes (Signed)
eLink Physician-Brief Progress Note Patient Name: Kyle Conrad DOB: [birth year] MRN: [id number]   Date of Service  08/18/2018  HPI/Events of Note  Thrombocytopenia with drop in plts from 44 to 14.  No evidence of active bleeding.  Is on pepcid and subq heparin.  Not on SCDs due to excessive swelling.    eICU Interventions  Plan: D/C H2 blocker and start PPI If thrombocytopenia persists consider d/c of heparin subq     Intervention Category Major Interventions: Other:  DETERDING,ELIZABETH 08/18/2018, 3:30 AM

## 2018-08-18 NOTE — Progress Notes (Signed)
Subjective: Interval History: still pressors. sys bp in 80s. Now making some urine with high dose lasix. On NE  Objective: Vital signs in last 24 hours: Temp:  [91.5 F (33.1 C)-100.4 F (38 C)] 95.4 F (35.2 C) (04/04 0400) Pulse Rate:  [69-100] 91 (04/04 0415) Resp:  [11-35] 15 (04/04 0415) BP: (76-127)/(49-99) 95/59 (04/04 0415) SpO2:  [96 %-100 %] 99 % (04/04 0415) Weight:  [104.5 kg] 104.5 kg (04/03 0500) Weight change:   Intake/Output from previous day: 04/03 0701 - 04/04 0700 In: 1656.5 [I.V.:1194.4; IV Piggyback:462.2] Out: 9622 [Urine:1940; Stool:1] Intake/Output this shift: Total I/O In: 552.7 [I.V.:320.7; IV Piggyback:232] Out: 2345 [Urine:845; Other:1500]  General appearance: confused, severely edematous Resp: rales bilaterally and rhonchi bilaterally Cardio: S1, S2 normal GI: distended, pos bs, liver down 8 cm, soft Extremities: edema 4+ with blistering  Lab Results: Recent Labs    08/17/18 0353 08/17/18 0912 08/18/18 0207  WBC 4.9  --  6.3  HGB 10.8* 12.2* 10.3*  HCT 34.5* 36.0* 32.4*  PLT 44*  --  14*   BMET:  Recent Labs    08/17/18 1755 08/18/18 0207  NA 139 140  K 4.2 4.2  CL 110 112*  CO2 19* 22  GLUCOSE 146* 81  BUN 48* 41*  CREATININE 1.99* 1.71*  CALCIUM 7.8* 7.8*   No results for input(s): PTH in the last 72 hours. Iron Studies: No results for input(s): IRON, TIBC, TRANSFERRIN, FERRITIN in the last 72 hours.  Studies/Results: US Renal  Result Date: 08/17/2018 CLINICAL DATA:  Inpatient.  Acute kidney injury. EXAM: RENAL / URINARY TRACT ULTRASOUND COMPLETE COMPARISON:  03/24/2018 CT abdomen/pelvis. FINDINGS: Right Kidney: Renal measurements: 9.8 x 4.6 x 5.0 cm = volume: 117 mL. Echogenic right renal parenchyma, which appears within normal limits in thickness. No hydronephrosis. No renal mass. Left Kidney: Renal measurements: 9.7 x 5.0 x 4.7 cm = volume: 119 mL. Echogenic left renal parenchyma, which appears normal in thickness. No  hydronephrosis. No renal mass. Bladder: Appears normal for degree of bladder distention. Incidentally noted large volume ascites. IMPRESSION: 1. No hydronephrosis. 2. 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 3. Normal bladder. 4. Incidental large volume ascites. Electronically Signed   By: Ilona Sorrel M.D.   On: 08/17/2018 08:35   Dg Chest Port 1 View  Result Date: 08/17/2018 CLINICAL DATA:  Shortness of breath, pneumonia EXAM: PORTABLE CHEST 1 VIEW COMPARISON:  08/16/2018 FINDINGS: Cardiomegaly, vascular congestion. Low lung volumes. Bibasilar atelectasis or infiltrates, increased since prior study. No effusions or acute bony abnormality. IMPRESSION: Cardiomegaly, vascular congestion. Bibasilar atelectasis or infiltrates, increasing since prior study. Electronically Signed   By: Rolm Baptise M.D.   On: 08/17/2018 07:24   Dg Chest Port 1 View  Result Date: 08/16/2018 CLINICAL DATA:  Central line placement. EXAM: PORTABLE CHEST 1 VIEW COMPARISON:  Chest x-ray from same day. FINDINGS: Interval placement of a right subclavian central venous catheter with the tip in the distal SVC. Stable cardiomegaly with unchanged pulmonary vascular congestion. No focal consolidation, pleural effusion, or pneumothorax. No acute osseous abnormality. IMPRESSION: 1. Right subclavian central venous catheter with tip in the distal SVC. No pneumothorax. 2. Stable cardiomegaly and pulmonary vascular congestion. Electronically Signed   By: Titus Dubin M.D.   On: 08/16/2018 18:08   Dg Chest Portable 1 View  Result Date: 08/16/2018 CLINICAL DATA:  Initial evaluation for acute shortness of breath. EXAM: PORTABLE CHEST 1 VIEW COMPARISON:  Prior radiograph from 03/24/2018. FINDINGS:  Advanced cardiomegaly, similar to previous. Mediastinal silhouette within normal limits. Lungs mildly hypoinflated. Diffuse pulmonary vascular congestion with interstitial prominence,  suggesting mild pulmonary interstitial congestion/edema. No definite pleural effusion. No consolidative opacity. No pneumothorax. No acute osseous finding. Prominent degenerative changes about the shoulders bilaterally. IMPRESSION: 1. Cardiomegaly with mild diffuse pulmonary interstitial congestion/edema. 2. No other active cardiopulmonary disease. Electronically Signed   By: Jeannine Boga M.D.   On: 08/16/2018 13:50   Dg Abd Portable 1 View  Result Date: 08/16/2018 CLINICAL DATA:  Initial evaluation for acute abdominal distension. EXAM: PORTABLE ABDOMEN - 1 VIEW COMPARISON:  Prior radiograph from 03/24/2018 FINDINGS: Visualized bowel gas pattern within normal limits without obstruction or ileus. Mild gaseous distension of the stomach. No abnormal bowel wall thickening. No appreciable free air on this limited single supine partial view of the abdomen. No soft tissue mass or abnormal calcification. Visualized osseous structures demonstrate no acute finding. Osteoarthritic changes about the hips bilaterally. Degenerative changes noted within the lower lumbar spine. IMPRESSION: Nonobstructive bowel gas pattern with no radiographic evidence for acute intra-abdominal pathology. Electronically Signed   By: Jeannine Boga M.D.   On: 08/16/2018 13:54    I have reviewed the patient's current medications.  Assessment/Plan: 1 AKI Enterobact sepsis, and cardiogenic. Getting vol off and bp low but stable. Will cont Lasix and CRRT. Good solute and acid/base/K control. Will check phos when comes back. 2 Anemia 3 Low ptlt will stop all hep but suspect sepsis as etio 4 CM 5 Enterobact sepsis on AB 6 Nonadherence. 7 schock on NE, DB P CRRT, stop hep, cont Lasix. AB, NE    LOS: 2 days   Jeneen Rinks [place] Caba 08/18/2018,4:35 AM

## 2018-08-19 LAB — CBC WITH DIFFERENTIAL/PLATELET
Abs Immature Granulocytes: 0.13 10*3/uL — ABNORMAL HIGH (ref 0.00–0.07)
Basophils Absolute: 0.1 10*3/uL (ref 0.0–0.1)
Basophils Relative: 1 %
Eosinophils Absolute: 1 10*3/uL — ABNORMAL HIGH (ref 0.0–0.5)
Eosinophils Relative: 10 %
HCT: 30.3 % — ABNORMAL LOW (ref 39.0–52.0)
Hemoglobin: 9.7 g/dL — ABNORMAL LOW (ref 13.0–17.0)
Immature Granulocytes: 1 %
Lymphocytes Relative: 3 %
Lymphs Abs: 0.3 10*3/uL — ABNORMAL LOW (ref 0.7–4.0)
MCH: 27.9 pg (ref 26.0–34.0)
MCHC: 32 g/dL (ref 30.0–36.0)
MCV: 87.1 fL (ref 80.0–100.0)
Monocytes Absolute: 0.3 10*3/uL (ref 0.1–1.0)
Monocytes Relative: 3 %
Neutro Abs: 8 10*3/uL — ABNORMAL HIGH (ref 1.7–7.7)
Neutrophils Relative %: 82 %
Platelets: 8 10*3/uL — CL (ref 150–400)
RBC: 3.48 MIL/uL — ABNORMAL LOW (ref 4.22–5.81)
RDW: 17.4 % — ABNORMAL HIGH (ref 11.5–15.5)
WBC: 9.8 10*3/uL (ref 4.0–10.5)
nRBC: 0.5 % — ABNORMAL HIGH (ref 0.0–0.2)

## 2018-08-19 LAB — GLUCOSE, CAPILLARY
Glucose-Capillary: 91 mg/dL (ref 70–99)
Glucose-Capillary: 155 mg/dL — ABNORMAL HIGH (ref 70–99)
Glucose-Capillary: 82 mg/dL (ref 70–99)
Glucose-Capillary: 78 mg/dL (ref 70–99)
Glucose-Capillary: 100 mg/dL — ABNORMAL HIGH (ref 70–99)
Glucose-Capillary: 78 mg/dL (ref 70–99)

## 2018-08-19 LAB — POCT I-STAT 7, (LYTES, BLD GAS, ICA,H+H)
Acid-Base Excess: 2 mmol/L (ref 0.0–2.0)
Bicarbonate: 25.7 mmol/L (ref 20.0–28.0)
Calcium, Ion: 1.2 mmol/L (ref 1.15–1.40)
HCT: 30 % — ABNORMAL LOW (ref 39.0–52.0)
Hemoglobin: 10.2 g/dL — ABNORMAL LOW (ref 13.0–17.0)
O2 Saturation: 96 %
Patient temperature: 35.8
Potassium: 3.9 mmol/L (ref 3.5–5.1)
Sodium: 138 mmol/L (ref 135–145)
TCO2: 27 mmol/L (ref 22–32)
pCO2 arterial: 35.9 mmHg (ref 32.0–48.0)
pH, Arterial: 7.458 — ABNORMAL HIGH (ref 7.350–7.450)
pO2, Arterial: 76 mmHg — ABNORMAL LOW (ref 83.0–108.0)

## 2018-08-19 LAB — RENAL FUNCTION PANEL
Albumin: 1.4 g/dL — ABNORMAL LOW (ref 3.5–5.0)
Albumin: 1.4 g/dL — ABNORMAL LOW (ref 3.5–5.0)
Anion gap: 7 (ref 5–15)
Anion gap: 6 (ref 5–15)
BUN: 26 mg/dL — ABNORMAL HIGH (ref 6–20)
BUN: 24 mg/dL — ABNORMAL HIGH (ref 6–20)
CO2: 23 mmol/L (ref 22–32)
CO2: 26 mmol/L (ref 22–32)
Calcium: 7.6 mg/dL — ABNORMAL LOW (ref 8.9–10.3)
Calcium: 7.8 mg/dL — ABNORMAL LOW (ref 8.9–10.3)
Chloride: 106 mmol/L (ref 98–111)
Chloride: 105 mmol/L (ref 98–111)
Creatinine, Ser: 1.17 mg/dL (ref 0.61–1.24)
Creatinine, Ser: 1.17 mg/dL (ref 0.61–1.24)
GFR calc Af Amer: >60 mL/min (ref >60)
GFR calc Af Amer: >60 mL/min (ref >60)
GFR calc non Af Amer: >60 mL/min (ref >60)
GFR calc non Af Amer: >60 mL/min (ref >60)
Glucose, Bld: 98 mg/dL (ref 70–99)
Glucose, Bld: 102 mg/dL — ABNORMAL HIGH (ref 70–99)
Phosphorus: 2.3 mg/dL — ABNORMAL LOW (ref 2.5–4.6)
Phosphorus: 4.4 mg/dL (ref 2.5–4.6)
Potassium: 4.2 mmol/L (ref 3.5–5.1)
Potassium: 4.1 mmol/L (ref 3.5–5.1)
Sodium: 136 mmol/L (ref 135–145)
Sodium: 137 mmol/L (ref 135–145)

## 2018-08-19 LAB — DIC (DISSEMINATED INTRAVASCULAR COAGULATION)PANEL
D-Dimer, Quant: 3.71 ug/mL-FEU — ABNORMAL HIGH (ref 0.00–0.50)
Fibrinogen: 432 mg/dL (ref 210–475)
INR: 2.2 — ABNORMAL HIGH (ref 0.8–1.2)
Platelets: 11 10*3/uL — CL (ref 150–400)
Prothrombin Time: 23.8 seconds — ABNORMAL HIGH (ref 11.4–15.2)
Smear Review: NONE SEEN
aPTT: 59 seconds — ABNORMAL HIGH (ref 24–36)

## 2018-08-19 LAB — CULTURE, BLOOD (ROUTINE X 2): Special Requests: ADEQUATE

## 2018-08-19 LAB — COOXEMETRY PANEL
Carboxyhemoglobin: 1.6 % — ABNORMAL HIGH (ref 0.5–1.5)
Methemoglobin: 1.9 % — ABNORMAL HIGH (ref 0.0–1.5)
O2 Saturation: 71.2 %
Total hemoglobin: 10.4 g/dL — ABNORMAL LOW (ref 12.0–16.0)

## 2018-08-19 LAB — MAGNESIUM: Magnesium: 2 mg/dL (ref 1.7–2.4)

## 2018-08-19 MED ORDER — SODIUM CHLORIDE 0.9% IV SOLUTION: Freq: Once | INTRAVENOUS | Status: AC

## 2018-08-19 MED ORDER — SODIUM PHOSPHATES 45 MMOLE/15ML IV SOLN
30.0000 mmol | Freq: Once | INTRAVENOUS | Status: AC
  Administered 2018-08-19: 08:00:00 30 mmol via INTRAVENOUS
  Filled 2018-08-19: qty 10

## 2018-08-19 MED ORDER — SODIUM CHLORIDE 0.9 % IV SOLN
2.0000 g | INTRAVENOUS | Status: DC
  Administered 2018-08-19 – 2018-08-22 (×4): 2 g via INTRAVENOUS
  Filled 2018-08-19 (×6): qty 20

## 2018-08-19 MED ORDER — HYDROCORTISONE NA SUCCINATE PF 100 MG IJ SOLR
25.0000 mg | Freq: Four times a day (QID) | INTRAMUSCULAR | Status: AC
  Administered 2018-08-19 – 2018-08-22 (×12): 25 mg via INTRAVENOUS
  Filled 2018-08-19 (×10): qty 2

## 2018-08-19 NOTE — Progress Notes (Signed)
Patient ID: Kyle Conrad, male   DOB: 04-01-1966, 53 y.o.   MRN: [id number]     Advanced Heart Failure Rounding Note  PCP-Cardiologist: No primary care provider on file.   Subjective:    Patient remains awake and will follow commands but won't speak. Head CT yesterday no acute findings. According to RN passed swallow study easily and was talking to Speech.   Remains on dobutamine 4 and NE 3. On CVVHD. Co-ox 71%. Weight down another 7 pounds. (16 pounds total).  Remains on D10 at 50/hr. CBGs 80-90s.  Platelets down to 8K. No obvious bleeding. Hgb stable.   Tm 98.1, he is on vancomycin/cefepime.  Enterobacter species in blood.    Objective:   Weight Range: 97.5 kg Body mass index is 29.15 kg/m.   Vital Signs:   Temp:  [92.7 F (33.7 C)-98.1 F (36.7 C)] 98.1 F (36.7 C) (04/05 0820) Pulse Rate:  [77-118] 106 (04/05 0820) Resp:  [0-28] 11 (04/05 0820) BP: (74-116)/(56-90) 96/56 (04/05 0815) SpO2:  [89 %-100 %] 99 % (04/05 0820) Weight:  [97.5 kg] 97.5 kg (04/05 0500) Last BM Date: 08/18/18  Weight change: Filed Weights   08/17/18 0500 08/18/18 0500 08/19/18 0500  Weight: 104.5 kg 100.6 kg 97.5 kg    Intake/Output:   Intake/Output Summary (Last 24 hours) at 08/19/2018 0841 Last data filed at 08/19/2018 0800 Gross per 24 hour  Intake 2247.28 ml  Output 4878 ml  Net -2630.72 ml      Physical Exam    General: Ill appearing. Lethargic No resp difficulty  HEENT: normal x for poor dentition Neck: supple. JVP to ear . Carotids 2+ bilat; no bruits. No lymphadenopathy or thryomegaly appreciated. Cor: PMI laterally displaced. Regular rate & rhythm. +s3 [place] central line  Lungs: clear Abdomen: soft, nontender, + distended. No hepatosplenomegaly. No bruits or masses. Good bowel sounds. Extremities: no cyanosis, clubbing, rash, 3-4+ edema + wounds   RFV trialysis Neuro: lethargic follows commands. Won't speak   Telemetry   Sinus tach 100-110s, low voltage (personally  reviewed)  Labs    CBC Recent Labs    08/18/18 1751 08/19/18 0413  WBC 8.1 9.8  NEUTROABS 7.6 8.0*  HGB 9.9* 9.7*  HCT 29.9* 30.3*  MCV 89.0 87.1  PLT 8* 8*   Basic Metabolic Panel Recent Labs    08/18/18 0207 08/18/18 1600 08/19/18 0413  NA 140 138 136  K 4.2 4.1 4.2  CL 112* 108 106  CO2 22 25 23   GLUCOSE 81 97 98  BUN 41* 31* 26*  CREATININE 1.71* 1.39* 1.17  CALCIUM 7.8* 7.7* 7.6*  MG 2.2  --  2.0  PHOS  --  2.8 2.3*   Liver Function Tests Recent Labs    08/16/18 1611  08/18/18 0207 08/18/18 1600 08/19/18 0413  AST 54*  --  37  --   --   ALT 21  --  20  --   --   ALKPHOS 99  --  64  --   --   BILITOT 3.7*  --  4.7*  --   --   PROT 5.2*  --  5.1*  --   --   ALBUMIN 1.7*   < > 1.4* 1.3* 1.4*   < > = values in this interval not displayed.   Recent Labs    08/16/18 1611  AMYLASE 28   Cardiac Enzymes Recent Labs    08/16/18 1333  CKTOTAL 194  TROPONINI <0.03    BNP: BNP (  last 3 results) Recent Labs    03/24/18 1435 06/05/18 1411 08/16/18 1611  BNP 2,471.9* 3,961.4* 2,433.3*    ProBNP (last 3 results) No results for input(s): PROBNP in the last 8760 hours.   D-Dimer Recent Labs    08/16/18 1611  DDIMER 3.83*   Hemoglobin A1C No results for input(s): HGBA1C in the last 72 hours. Fasting Lipid Panel No results for input(s): CHOL, HDL, LDLCALC, TRIG, CHOLHDL, LDLDIRECT in the last 72 hours. Thyroid Function Tests Recent Labs    08/16/18 1424 08/16/18 1643  TSH 5.578*  --   T3FREE  --  1.2*    Other results:   Imaging    Ct Head Wo Contrast  Result Date: 08/18/2018 CLINICAL DATA:  Altered mental status, unexplained EXAM: CT HEAD WITHOUT CONTRAST TECHNIQUE: Contiguous axial images were obtained from the base of the skull through the vertex without intravenous contrast. COMPARISON:  09/08/2008 FINDINGS: Brain: No evidence of acute infarction, hemorrhage, hydrocephalus, extra-axial collection or mass lesion/mass effect.  Premature atrophy. Mild or moderate chronic small vessel ischemia in the cerebral white matter. Vascular: Atherosclerotic calcification.  No hyperdense vessel. Skull: No acute finding Sinuses/Orbits: Right maxillary sinus fluid level. IMPRESSION: 1. No acute finding. 2. Premature atrophy and chronic small vessel ischemia. 3. Right maxillary sinus fluid level. Electronically Signed   By: Monte Fantasia M.D.   On: 08/18/2018 11:03     Medications:     Scheduled Medications: . chlorhexidine  15 mL Mouth Rinse BID  . Chlorhexidine Gluconate Cloth  6 each Topical Daily  . hydrocortisone sodium succinate  50 mg Intravenous Q6H  . mouth rinse  15 mL Mouth Rinse q12n4p  . pantoprazole (PROTONIX) IV  40 mg Intravenous Q0600  . sodium chloride flush  10-40 mL Intracatheter Q12H  . thiamine injection  100 mg Intravenous Daily    Infusions: .  prismasol BGK 4/2.5 500 mL/hr at 08/19/18 0104  .  prismasol BGK 4/2.5 500 mL/hr at 08/19/18 0103  . sodium chloride Stopped (08/18/18 0747)  . sodium chloride    . ceFEPime (MAXIPIME) IV Stopped (08/18/18 2234)  . dextrose 50 mL/hr at 08/19/18 0800  . DOBUTamine 4 mcg/kg/min (08/19/18 0837)  . furosemide Stopped (08/19/18 0606)  . norepinephrine (LEVOPHED) Adult infusion 4 mcg/min (08/19/18 0800)  . prismasol BGK 4/2.5 2,000 mL/hr (08/19/18 0715)  . sodium phosphate  Dextrose 5% IVPB 43 mL/hr at 08/19/18 0800    PRN Medications: Place/Maintain arterial line **AND** sodium chloride, lip balm, ondansetron (ZOFRAN) IV, sodium chloride, sodium chloride flush   Assessment/Plan   1. 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  He is markedly volume overloaded with suspected mixed septic/cardiogenic shock.  Co-ox adequate today with MAP in 70s on norepinephrine 18 with CVP > 25.  Weight is up 80 lbs from  his prior baseline after 10/19 hospitalization.    Low volts compared to past noted on ECG.  - Echo 4/3 with severe biventricula failure. LVEF 20-25% small effusion. Moderate to severe MR - Now on NE 3 and dobutamine 4. Co-ox 71% - He is massively volume overloaded. ~ 90 pounds. Continue volume removal per CVVHD.  - He is not going to be a candidate for advanced therapies.  LVAD likely not an option regardless with severe RV dysfunction and has had very poor compliance with medical therapy.  2. ID: Suspect component of septic  shock PCT 6.76.  Possible LLL PNA on CXR.  Enterobacter in blood.  - Continue empiric abx per CCM.  - Support MAP with NE and dobutamine.  3. AKI: Concern for ATN in setting of shock.  Was down at home for an undetermined length of time.  Now on CVVHD - Continue CVVHD 4. Thrombocytopenia: Noted at admission, normal plts in the past.  Suspect related to sepsis.  - Platelets 8k. No bleeding. With PLTs < 10K. Will transfuse 1 pack.  5. Hypoglyemia: On D10 gtt at 44. CBGs ok. Will drop to 25 6. Neuro: Confusion/delirium, likely in setting of critical illness/infection. Now also with aphonia - Head CT 4/4 - No acute - Passed swallow study and was interactive with Speech team  7. DVT prophylaxis: Hold McBaine heparin with low PLTs. Place SCDs  Prognosis is poor. Need to consider Palliative Care team involvement but not sure he can make that decision at this time   CRITICAL CARE Performed by: Glori Bickers  Total critical care time: 40 minutes  Critical care time was exclusive of separately billable procedures and treating other patients.  Critical care was necessary to treat or prevent imminent or life-threatening deterioration.  Critical care was time spent personally by me on the following activities: development of treatment plan with patient and/or surrogate as well as nursing, discussions with consultants, evaluation of patient's response to treatment, examination of  patient, obtaining history from patient or surrogate, ordering and performing treatments and interventions, ordering and review of laboratory studies, ordering and review of radiographic studies, pulse oximetry and re-evaluation of patient's condition.   Length of Stay: 3  Glori Bickers, MD  08/19/2018, 8:41 AM  Advanced Heart Failure Team Pager [phone] (M-F; 7a - 4p)  Please contact [facility] for night-coverage after hours (4p -7a ) and weekends on [email or web address]

## 2018-08-19 NOTE — Progress Notes (Signed)
eLink Physician-Brief Progress Note Patient Name: Kyle Conrad DOB: 02/15/1966 MRN: [id number]   Date of Service  08/19/2018  HPI/Events of Note  Notified by bedside RN that patient seems more lethargic to him.  He is resting comfortably, not in distress.   eICU Interventions  Check ABG.   Check glucose - not hypoglycemic.      Intervention Category Intermediate Interventions: Change in mental status - evaluation and management  [name] 08/19/2018, 8:26 PM

## 2018-08-19 NOTE — Progress Notes (Signed)
  Speech Language Pathology Treatment: Dysphagia  Patient Details Name: Kyle Conrad MRN: [id number] DOB: [birth year] Today's Date: 08/19/2018 Time: 1440-1500 SLP Time Calculation (min) (ACUTE ONLY): 20 min  Assessment / Plan / Recommendation Clinical Impression  SLP followed up for diet tolerance. Pt with waxing and waning mentation. This date with significantly decreased alertness and lethargy. RN reports yesterday pt with much better alertness and consumed dinner meal without overt difficulty. Trialed ice chip via teaspoon x 2 with delayed AP transit, suspected delay in swallow initiation and delayed weak congested cough. Per discussion with MD, will continue regular thin liquid diet as mentation allows. Staff to hold PO when poor mentation is exhibited. Pt currently receiving IV medicines. SLP to follow up briefly as indicated    HPI HPI: 53 year old man who remains critically ill due to mixed distributive and cardiogenic shock. Evolving AKI due to cardiorenal syndrome. Enterobacter bacteremia. Hypoglycemia due to malnutrition. Cardiomegaly, vascular congestion with bibasilar infiltrates. Pt noted to have difficulty talking, CT head ordered      SLP Plan  Continue with current plan of care       Recommendations  Diet recommendations: Other(comment)(per MD discretion; as mentation permits); regular thin liquid when alert for POs Medication Administration: Via alternative means Supervision: Staff to assist with self feeding Compensations: Slow rate;Small sips/bites Postural Changes and/or Swallow Maneuvers: Seated upright 90 degrees;Upright 30-60 min after meal                Oral Care Recommendations: Oral care QID Follow up Recommendations: Skilled Nursing facility SLP Visit Diagnosis: Dysphagia, unspecified (R13.10) Plan: Continue with current plan of care       [place] MA, CCC-SLP Acute Rehab Speech Language Pathology  08/19/2018, 3:11  PM

## 2018-08-19 NOTE — Progress Notes (Signed)
Subjective: Interval History: still confused, awake but not coop or communicative.  Objective: Vital signs in last 24 hours: Temp:  [92.7 F (33.7 C)-98.1 F (36.7 C)] 98.1 F (36.7 C) (04/05 0600) Pulse Rate:  [77-113] 113 (04/05 0600) Resp:  [11-20] 20 (04/05 0600) BP: (74-116)/(58-90) 106/76 (04/05 0600) SpO2:  [93 %-100 %] 96 % (04/05 0600) Weight:  [97.5 kg] 97.5 kg (04/05 0500) Weight change: -3.1 kg  Intake/Output from previous day: 04/04 0701 - 04/05 0700 In: 2256.5 [P.O.:240; I.V.:1486.5; IV Piggyback:530] Out: 3557 [Urine:700] Intake/Output this shift: No intake/output data recorded.  General appearance: no distress and as above, NAD Resp: rales bibasilar and rhonchi bibasilar Cardio: S1, S2 normal and systolic murmur: holosystolic 2/6, blowing at apex GI: pos bs, liver down 8 cm , distended Extremities: edema 4+ and R fem cath  Lab Results: Recent Labs    08/18/18 1751 08/19/18 0413  WBC 8.1 9.8  HGB 9.9* 9.7*  HCT 29.9* 30.3*  PLT 8* 8*   BMET:  Recent Labs    08/18/18 1600 08/19/18 0413  NA 138 136  K 4.1 4.2  CL 108 106  CO2 25 23  GLUCOSE 97 98  BUN 31* 26*  CREATININE 1.39* 1.17  CALCIUM 7.7* 7.6*   No results for input(s): PTH in the last 72 hours. Iron Studies: No results for input(s): IRON, TIBC, TRANSFERRIN, FERRITIN in the last 72 hours.  Studies/Results: Ct Head Wo Contrast  Result Date: 08/18/2018 CLINICAL DATA:  Altered mental status, unexplained EXAM: CT HEAD WITHOUT CONTRAST TECHNIQUE: Contiguous axial images were obtained from the base of the skull through the vertex without intravenous contrast. COMPARISON:  09/08/2008 FINDINGS: Brain: No evidence of acute infarction, hemorrhage, hydrocephalus, extra-axial collection or mass lesion/mass effect. Premature atrophy. Mild or moderate chronic small vessel ischemia in the cerebral white matter. Vascular: Atherosclerotic calcification.  No hyperdense vessel. Skull: No acute finding  Sinuses/Orbits: Right maxillary sinus fluid level. IMPRESSION: 1. No acute finding. 2. Premature atrophy and chronic small vessel ischemia. 3. Right maxillary sinus fluid level. Electronically Signed   By: Monte Fantasia M.D.   On: 08/18/2018 11:03   US Renal  Result Date: 08/17/2018 CLINICAL DATA:  Inpatient.  Acute kidney injury. EXAM: RENAL / URINARY TRACT ULTRASOUND COMPLETE COMPARISON:  03/24/2018 CT abdomen/pelvis. FINDINGS: Right Kidney: Renal measurements: 9.8 x 4.6 x 5.0 cm = volume: 117 mL. Echogenic right renal parenchyma, which appears within normal limits in thickness. No hydronephrosis. No renal mass. Left Kidney: Renal measurements: 9.7 x 5.0 x 4.7 cm = volume: 119 mL. Echogenic left renal parenchyma, which appears normal in thickness. No hydronephrosis. No renal mass. Bladder: Appears normal for degree of bladder distention. Incidentally noted large volume ascites. IMPRESSION: 1. No hydronephrosis. 2. 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 3. Normal bladder. 4. Incidental large volume ascites. Electronically Signed   By: Ilona Sorrel M.D.   On: 08/17/2018 08:35    I have reviewed the patient's current medications.  Assessment/Plan: 1 AKI nonoliguric and responding to Lasix, helping vol.  CRRT going well. Acid/base/K/solute ok. Mechanics ok. 2 CM  3 Anemia stable 4 Thrombocytopenia, no active bleeding, suspect sepsis  5 Enterobacter sepsis on AB  6 Schock bps stable on DB and pressors. P CRRT, Lasix, lower vol,solute.  AB.    LOS: 3 days   Jeneen Rinks Wyndham Santilli 08/19/2018,7:21 AM

## 2018-08-19 NOTE — Progress Notes (Signed)
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  Being admitted 4/2 with acute mental status change, and decompensated biventricular heart failure and shock. Blood cultures growing Enterobacteriaceae. He has had continuing worsening thrombocytopenia w/o signs of bleeding.  History of present illness   53 year old male patient followed at the heart failure clinic with known ejection fraction of 10 to 15% with biventricular heart failure. Presents to the emergency room on 4/2 after being found by a neighbor on the floor at his house.  Reportedly he had just fallen but it is unclear exactly when this occurred as it was unwitnessed.  He was confused, reporting weakness to be generalized.  His weight had increased.  Last weight recorded in the clinic was 94 kg, he weighed 112 kg on presentation.  On EMS arrival his blood glucose was 47.  On arrival he was hypothermic with temperature of 86.9 Fahrenheit hypotensive with systolic blood pressure initially in the 80s but then down into the 50s he had diffuse 3-4+ pitting edema requiring 4 L to keep saturations greater than 90%.  Because of his hypotension, as well as altered sensorium the critical care team was asked to evaluate.  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  Severe mitral valve regurgitation. Diverticulosis Angioedema with ACE inhibitor Iron deficiency anemia Possible cirrhosis  Significant Hospital Events   4/2 admitted with hypoxia, delirium, and decompensated biventricular heart failure with circulatory shock 4/3 BCID Enterobacteriaceae species> citrobacter freundii  Consults:  HF 4/2 Nephro 4/3  Procedures:  R CVC 4/2>> R  femoral HD cath 4/3>>  Significant Diagnostic Tests:  CXR 4/3>> increasing bilateral pulmonary infiltrates RUS 4/3>> no hydronephrosis, large volume ascites  Micro Data:  Blood cultures x2/2 > BCID Enterobacteriaceae > citrobacter ferundii U strep 4/2>>> negative u legionella 4/2>>> negative  Antimicrobials:  Levofloxacin 4/2 Vancomycin 4/2>4/3 Cefepmine 4/3>>  Interim history/subjective:  Awake and alert, however not able to speak. Glucose stabilized with D50 infusion. Had minimal UOP over night.  Objective   Blood pressure (!) 90/59, pulse (!) 110, temperature 98.4 F (36.9 C), resp. rate 14, height 6' (1.829 m), weight 97.5 kg, SpO2 100 %. CVP:  [10 mmHg-12 mmHg] 10 mmHg      Intake/Output Summary (Last 24 hours) at 08/19/2018 1002 Last data filed at 08/19/2018 0900 Gross per 24 hour  Intake 2016.73 ml  Output 4908 ml  Net -2891.27 ml   Filed Weights   08/17/18 0500 08/18/18 0500 08/19/18 0500  Weight: 104.5 kg 100.6 kg 97.5 kg    Examination: General: wakes with sternal rub HENT: anicteric sclera Lungs: No increased work of breathing, upper airway sounds but overall clear lung sounds bilaterally Cardiovascular: RRR, no M/R Abdomen: Distended, active bowel sounds Extremities: Anasarca with LE wounds and blisters L>R, anasarca improving Neuro: Generalized weakness, EOMI, PERRL  Resolved Hospital Problem list   Acute Hypoxic Respiratory failure in setting of decompensated HF w/ volume overload/pulm edema Assessment & Plan:   Cardiogenic and septic shock: HD stable while weaning norepinephrine. On cefepime for gram negative bacteremia to address sepsis. Diuresing and getting CRRT for volume removal and on dobutamine for biventricular heart failure, however not candidate for other advanced therapies. On stress dose steroids.  Acute decompensated Biventricular Heart failure: Echo reveals EF 20-25% with severe RV systolic failure. Dry weight 64kgs, weight 112kg on  admission. Plan On dobutamine and titrate off of levophed as able IV lasix 160mg  QID CRRT for volume removal Daily weight, ins/outs Cefepime day 4  Citrobacter freundii bacteremia: - Cefepime day 4  AKI, in setting of above, RUS w/o hydronephrosis. Some response to lasix (759ml out in 24 hrs), but removing 200cc/hr with CRRT  Acute metabolic encephalopathy  CT head negative  Plan Cont supportive care Hold sedating meds  Thrombocytopenia - 50s on admission now 8 Likely related to sepsis, however have to consider DIC, HUS as well - f/u DIC panel - transfuse 1 unit plts  Refractory Hypoglycemia- not of hypoglycemics at home, likely 2/2 sepsis and cardiac failure Plan Trend hourly glucose  Continue D10 for now, able to tolerate PO intake some which will help   Best practice:  Diet: renal with FR Pain/Anxiety/Delirium protocol (if indicated): n/a VAP protocol (if indicated): n/a DVT prophylaxis: SCDs GI prophylaxis: protonix Glucose control: n/a Mobility: BR due to profound weakness Code Status: FULL Family Communication: pending Disposition: ICU  Labs   CBC: Recent Labs  Lab 08/16/18 1333 08/16/18 1611 08/17/18 0353 08/17/18 0912 08/18/18 0207 08/18/18 1751 08/19/18 0413  WBC 4.3 2.2* 4.9  --  6.3 8.1 9.8  NEUTROABS 1.9 2.1  --   --   --  7.6 8.0*  HGB 12.5* 10.9* 10.8* 12.2* 10.3* 9.9* 9.7*  HCT 40.6 34.0* 34.5* 36.0* 32.4* 29.9* 30.3*  MCV 92.1 91.2 91.0  --  88.5 89.0 87.1  PLT 53* 45* 44*  --  14* 8* 8*    Basic Metabolic Panel: Recent Labs  Lab 08/16/18 1611  08/17/18 0353  08/17/18 1334 08/17/18 1755 08/18/18 0207 08/18/18 1600 08/19/18 0413  NA 141  --  140   < > 138 139 140 138 136  K 4.0  --  4.4   < > 4.5 4.2 4.2 4.1 4.2  CL 114*  --  112*  --  111 110 112* 108 106  CO2 18*  --  17*  --  19* 19* 22 25 23   GLUCOSE 84   < > 75  --  143* 146* 81 97 98  BUN 46*  --  49*  --  53* 48* 41* 31* 26*  CREATININE 1.83*  --  2.15*  --  2.23*  1.99* 1.71* 1.39* 1.17  CALCIUM 8.2*  --  8.2*  --  7.6* 7.8* 7.8* 7.7* 7.6*  MG 1.7  --  1.8  --   --   --  2.2  --  2.0  PHOS 3.9  --  3.8  --   --  4.3  --  2.8 2.3*   < > = values in this interval not displayed.   GFR: Estimated Creatinine Clearance: 87.4 mL/min (by C-G formula based on SCr of 1.17 mg/dL). Recent Labs  Lab 08/16/18 1333 08/16/18 1611 08/16/18 1645 08/16/18 2227 08/17/18 0353 08/18/18 0207 08/18/18 1751 08/19/18 0413  PROCALCITON  --  6.76  --   --   --   --   --   --   WBC 4.3 2.2*  --   --  4.9 6.3 8.1 9.8  LATICACIDVEN 3.1*  --  4.3* 4.9*  --   --   --   --     Liver Function Tests: Recent Labs  Lab 08/16/18 1333 08/16/18 1611 08/17/18 1755 08/18/18 0207 08/18/18  1600 08/19/18 0413  AST 48* 54*  --  37  --   --   ALT 23 21  --  20  --   --   ALKPHOS 114 99  --  64  --   --   BILITOT 3.7* 3.7*  --  4.7*  --   --   PROT 6.0* 5.2*  --  5.1*  --   --   ALBUMIN 1.9* 1.7* 1.4* 1.4* 1.3* 1.4*   Recent Labs  Lab 08/16/18 1611  AMYLASE 28   Recent Labs  Lab 08/16/18 1333 08/17/18 0923  AMMONIA 20 35    ABG    Component Value Date/Time   PHART 7.383 08/17/2018 0912   PCO2ART 28.1 (L) 08/17/2018 0912   PO2ART 79.0 (L) 08/17/2018 0912   HCO3 16.7 (L) 08/17/2018 0912   TCO2 18 (L) 08/17/2018 0912   ACIDBASEDEF 7.0 (H) 08/17/2018 0912   O2SAT 71.2 08/19/2018 0427     Coagulation Profile: Recent Labs  Lab 08/16/18 1611 08/17/18 0927  INR 4.3* 3.8*    Cardiac Enzymes: Recent Labs  Lab 08/16/18 1333  CKTOTAL 194  TROPONINI <0.03    HbA1C: Hgb A1c MFr Bld  Date/Time Value Ref Range Status  09/10/2008 12:40 PM  4.6 - 6.1 % Final   6.0 (NOTE) The ADA recommends the following therapeutic goal for glycemic control related to Hgb A1c measurement: Goal of therapy: <6.5 Hgb A1c  Reference: American Diabetes Association: Clinical Practice Recommendations 2010, Diabetes Care, 2010, 33: (Suppl  1).    CBG: Recent Labs  Lab 08/18/18  1717 08/18/18 2022 08/18/18 2336 08/19/18 0309 08/19/18 0803  GLUCAP 85 91 89 91 155*    Review of Systems:   Denies pain  Past Medical History  He,  has a past medical history of CHF (congestive heart failure) (HCC) and Hypertension.   Surgical History    Past Surgical History:  Procedure Laterality Date  . CARDIAC CATHETERIZATION N/A 04/30/2015   Procedure: Right/Left Heart Cath and Coronary Angiography;  Surgeon: Larey Dresser, MD;  Location: [place] [facility] LAB;  Service: Cardiovascular;  Laterality: N/A;  . COLONOSCOPY WITH PROPOFOL N/A 05/01/2015   Procedure: COLONOSCOPY WITH PROPOFOL;  Surgeon: Wilford Corner, MD;  Location: [facility] ENDOSCOPY;  Service: Endoscopy;  Laterality: N/A;  . ESOPHAGOGASTRODUODENOSCOPY (EGD) WITH PROPOFOL N/A 05/01/2015   Procedure: ESOPHAGOGASTRODUODENOSCOPY (EGD) WITH PROPOFOL;  Surgeon: Wilford Corner, MD;  Location: [facility] ENDOSCOPY;  Service: Endoscopy;  Laterality: N/A;  . INGUINAL HERNIA REPAIR Left 03/24/2018   Procedure: HERNIA REPAIR INGUINAL INCARCERATED;  Surgeon: Georganna Skeans, MD;  Location: [place];  Service: General;  Laterality: Left;  . INSERTION OF MESH Left 03/24/2018   Procedure: INSERTION OF MESH;  Surgeon: Georganna Skeans, MD;  Location: [place];  Service: General;  Laterality: Left;  . SHOULDER SURGERY       Social History   reports that he has never smoked. He has never used smokeless tobacco. He reports that he does not drink alcohol or use drugs.   Family History   His family history includes Lung cancer in his mother.   Allergies Allergies  Allergen Reactions  . Lisinopril Swelling    REACTION: pt had a swelling episode.  His lips swelled  . Penicillins Hives    Has patient had a PCN reaction causing immediate rash, facial/tongue/throat swelling, SOB or lightheadedness with hypotension: Yes Has patient had a PCN reaction causing severe rash involving mucus membranes or skin necrosis: No Has patient had  a PCN reaction  that required hospitalization: No Has patient had a PCN reaction occurring within the last 10 years: No If all of the above answers are "NO", then may proceed with Cephalosporin use.     Home Medications  Prior to Admission medications   Medication Sig Start Date End Date Taking? Authorizing Provider  metolazone (ZAROXOLYN) 2.5 MG tablet Take 1 tablet (2.5 mg total) by mouth daily. As directed by heart failure clinic 06/05/18 09/03/18 Yes Tillery, Satira Mccallum, PA-C  potassium chloride 20 MEQ TBCR Take 20 mEq by mouth 2 (two) times daily. 06/05/18  Yes Shirley Friar, PA-C  torsemide (DEMADEX) 20 MG tablet Take 2 tablets (40 mg total) by mouth 2 (two) times daily. 06/05/18  Yes Shirley Friar, PA-C  acetaminophen (TYLENOL) 500 MG tablet You can take 1000 mg every 8 hours as needed for pain.  Can alternate this with ibuprofen, or Tramadol.  Do not exceed 4000 mg of Tylenol(acetaminophen) per day it can harm your liver.  You can buy this over-the-counter at any drugstore. 03/28/18   Earnstine Regal, PA-C  docusate sodium (COLACE) 100 MG capsule Take 1 capsule (100 mg total) by mouth 2 (two) times daily. 03/20/18   Bonnielee Haff, MD  folic acid (FOLVITE) 1 MG tablet Take 1 tablet (1 mg total) by mouth daily. 03/20/18   Bonnielee Haff, MD  ibuprofen (ADVIL,MOTRIN) 200 MG tablet You can take 3 tablets every 6 hours as needed for pain.  You can alternate this with plain Tylenol or Tramadol.  You can buy this over-the-counter at any drugstore without a prescription. 03/28/18   Earnstine Regal, PA-C  losartan (COZAAR) 25 MG tablet Take 0.5 tablets (12.5 mg total) by mouth at bedtime. 04/10/18   Georgiana Shore, NP  magnesium oxide (MAG-OX) 400 MG tablet Take 1 tablet (400 mg total) by mouth daily. 04/11/18   Georgiana Shore, NP  Multiple Vitamin (MULTIVITAMIN WITH MINERALS) TABS tablet Take 1 tablet by mouth daily. 03/20/18   Bonnielee Haff, MD  polyethylene glycol [facility] / Floria Raveling)  packet You can use this for constipation as instructed on the package directions.  You can buy this at any drugstore without a prescription. Patient not taking: Reported on 08/16/2018 03/28/18   Earnstine Regal, PA-C  traMADol (ULTRAM) 50 MG tablet Take 1 tablet (50 mg total) by mouth every 6 (six) hours as needed for moderate pain (Third line use if pain not relieved by PO Tylenol and Ibuprofen). 03/28/18   Earnstine Regal, PA-C     Alphonzo Grieve, MD IMTS - PGY3 Pager [phone]

## 2018-08-20 LAB — CBC WITH DIFFERENTIAL/PLATELET
Abs Immature Granulocytes: 0.08 10*3/uL — ABNORMAL HIGH (ref 0.00–0.07)
Basophils Absolute: 0.1 10*3/uL (ref 0.0–0.1)
Basophils Relative: 0 %
Eosinophils Absolute: 0 10*3/uL (ref 0.0–0.5)
Eosinophils Relative: 0 %
HCT: 32.3 % — ABNORMAL LOW (ref 39.0–52.0)
Hemoglobin: 10.3 g/dL — ABNORMAL LOW (ref 13.0–17.0)
Immature Granulocytes: 1 %
Lymphocytes Relative: 4 %
Lymphs Abs: 0.4 10*3/uL — ABNORMAL LOW (ref 0.7–4.0)
MCH: 28 pg (ref 26.0–34.0)
MCHC: 31.9 g/dL (ref 30.0–36.0)
MCV: 87.8 fL (ref 80.0–100.0)
Monocytes Absolute: 0.4 10*3/uL (ref 0.1–1.0)
Monocytes Relative: 3 %
Neutro Abs: 11.3 10*3/uL — ABNORMAL HIGH (ref 1.7–7.7)
Neutrophils Relative %: 92 %
Platelets: 8 10*3/uL — CL (ref 150–400)
RBC: 3.68 MIL/uL — ABNORMAL LOW (ref 4.22–5.81)
RDW: 17.3 % — ABNORMAL HIGH (ref 11.5–15.5)
WBC: 12.3 10*3/uL — ABNORMAL HIGH (ref 4.0–10.5)
nRBC: 0.4 % — ABNORMAL HIGH (ref 0.0–0.2)

## 2018-08-20 LAB — RENAL FUNCTION PANEL
Albumin: 1.5 g/dL — ABNORMAL LOW (ref 3.5–5.0)
Albumin: 1.7 g/dL — ABNORMAL LOW (ref 3.5–5.0)
Anion gap: 6 (ref 5–15)
Anion gap: 7 (ref 5–15)
BUN: 23 mg/dL — ABNORMAL HIGH (ref 6–20)
BUN: 23 mg/dL — ABNORMAL HIGH (ref 6–20)
CO2: 25 mmol/L (ref 22–32)
CO2: 26 mmol/L (ref 22–32)
Calcium: 8 mg/dL — ABNORMAL LOW (ref 8.9–10.3)
Calcium: 8.4 mg/dL — ABNORMAL LOW (ref 8.9–10.3)
Chloride: 104 mmol/L (ref 98–111)
Chloride: 104 mmol/L (ref 98–111)
Creatinine, Ser: 1.07 mg/dL (ref 0.61–1.24)
Creatinine, Ser: 1.07 mg/dL (ref 0.61–1.24)
GFR calc Af Amer: >60 mL/min (ref >60)
GFR calc Af Amer: >60 mL/min (ref >60)
GFR calc non Af Amer: >60 mL/min (ref >60)
GFR calc non Af Amer: >60 mL/min (ref >60)
Glucose, Bld: 96 mg/dL (ref 70–99)
Glucose, Bld: 129 mg/dL — ABNORMAL HIGH (ref 70–99)
Phosphorus: 2.9 mg/dL (ref 2.5–4.6)
Phosphorus: 2.7 mg/dL (ref 2.5–4.6)
Potassium: 4.5 mmol/L (ref 3.5–5.1)
Potassium: 3.9 mmol/L (ref 3.5–5.1)
Sodium: 135 mmol/L (ref 135–145)
Sodium: 137 mmol/L (ref 135–145)

## 2018-08-20 LAB — BPAM PLATELET PHERESIS
Blood Product Expiration Date: 202004052359
ISSUE DATE / TIME: 202004051044
Unit Type and Rh: 6200

## 2018-08-20 LAB — COOXEMETRY PANEL
Carboxyhemoglobin: 1.7 % — ABNORMAL HIGH (ref 0.5–1.5)
Methemoglobin: 1.2 % (ref 0.0–1.5)
O2 Saturation: 72.7 %
Total hemoglobin: 10.8 g/dL — ABNORMAL LOW (ref 12.0–16.0)

## 2018-08-20 LAB — GLUCOSE, CAPILLARY
Glucose-Capillary: 111 mg/dL — ABNORMAL HIGH (ref 70–99)
Glucose-Capillary: 116 mg/dL — ABNORMAL HIGH (ref 70–99)
Glucose-Capillary: 84 mg/dL (ref 70–99)
Glucose-Capillary: 86 mg/dL (ref 70–99)
Glucose-Capillary: 94 mg/dL (ref 70–99)
Glucose-Capillary: 95 mg/dL (ref 70–99)
Glucose-Capillary: 97 mg/dL (ref 70–99)

## 2018-08-20 LAB — PREPARE PLATELET PHERESIS: Unit division: 0

## 2018-08-20 LAB — MAGNESIUM: Magnesium: 2.2 mg/dL (ref 1.7–2.4)

## 2018-08-20 MED ORDER — PRO-STAT SUGAR FREE PO LIQD
60.0000 mL | Freq: Two times a day (BID) | ORAL | Status: DC
  Administered 2018-08-20 – 2018-08-22 (×5): 60 mL
  Filled 2018-08-20 (×5): qty 60

## 2018-08-20 MED ORDER — COLLAGENASE 250 UNIT/GM EX OINT
TOPICAL_OINTMENT | Freq: Every day | CUTANEOUS | Status: DC
  Administered 2018-08-20 – 2018-08-24 (×5): via TOPICAL
  Administered 2018-08-25 – 2018-08-26 (×2): 1 via TOPICAL
  Administered 2018-08-27 – 2018-09-08 (×12): via TOPICAL
  Filled 2018-08-20 (×6): qty 30

## 2018-08-20 MED ORDER — SODIUM CHLORIDE 0.9% IV SOLUTION: Freq: Once | INTRAVENOUS | Status: AC

## 2018-08-20 MED ORDER — VITAL 1.5 CAL PO LIQD
1000.0000 mL | ORAL | Status: DC
  Administered 2018-08-20 – 2018-08-21 (×2): 1000 mL
  Filled 2018-08-20 (×3): qty 1000

## 2018-08-20 NOTE — Progress Notes (Signed)
Initial Nutrition Assessment  RD working remotely.  DOCUMENTATION CODES:   Not applicable  INTERVENTION:   - Recommend liberalizing pt's diet to REGULAR  Once Cortrak placed, initiate tube feeding: - Start Vital 1.5 @ 20 ml/hr and increase by 10 ml q 4 hours until GOAL RATE of 50 ml/hr - Pro-stat 60 ml BID  Tube feeding regimen at goal rate provides 2200 kcal, 141 grams of protein, and 917 ml of H2O (100% of needs).  NUTRITION DIAGNOSIS:   Increased nutrient needs related to acute illness (CHF exacerbation requiring CRRT for fluid removal) as evidenced by estimated needs.  GOAL:   Patient will meet greater than or equal to 90% of their needs  MONITOR:   PO intake, Labs, I & O's, Weight trends, Skin, TF tolerance  REASON FOR ASSESSMENT:   Consult Enteral/tube feeding initiation and management  ASSESSMENT:   53 year old male who presented to the ED on 4/2 with AMS and weakness. PMH of CHF, HTN. Pt admitted with decompensated biventricular heart failure and shock.  4/03 - CRRT initiated 4/04 - SLP evaluation with recommendations for regular diet and thin liquids  Noted plan for Cortrak today.  RD ordered verbal with readback consult for initiation and management of enteral nutrition per discussion with R. Agarwala, MD.  Per HF team, pt last seen in HF clinic 06/05/18 and weight was up 40 lbs after beingout of torsemide. Last weight recorded in the clinic was 94 kg and pt's weight on admission was 112 kg.  Reviewed weight history in chart. Pt's weight has fluctuated significantly over the last 5 months between 65.2 (lowest on 04/04/18) and 112 kg on admission. Suspect fluctuations related to fluid status. Pt's weight is down 22.9 kg since admission.  Reviewed RN edema assessment. Pt with very deep pitting edema to RUE and deep pitting edema to BLE and LUE. Pt remains on CRRT for volume removal.  Noted pt with poor prognosis, not a candidate for advanced therapies. HF  team and nephrology recommending palliative care consult.  Spoke with RN over the phone regarding TF regimen. Per RN, pt not alert enough to take anything PO today. RD will not order an oral nutrition supplement at this time.  Meal Completion: 25% x 1 meal on 08/18/18  Medications reviewed and include: Protonix, thiamine, IV antibiotics, D10 @ 25 ml/hr (provides 204 kcal daily), Dobutrex @ 6.3 ml/hr, Levophed @ 5.6 ml/hr  Labs reviewed: platelets 8 (L) CBG's: 94, 86, 84, 78, 100 x 24 hours K+, mag, phos all WNL.  UOP: 346 ml x 24 hours CRRT output: 6828 ml x 24 hours I/O's: -8.7 L since admit  NUTRITION - FOCUSED PHYSICAL EXAM:  Unable to complete at this time. RD working remotely.  Diet Order:   Diet Order            Diet renal with fluid restriction Fluid restriction: 1200 mL Fluid; Room service appropriate? Yes; Fluid consistency: Thin  Diet effective now              EDUCATION NEEDS:   No education needs have been identified at this time  Skin:  Skin Assessment: Skin Integrity Issues (non pressure wound to scrotum, weeping from BLE)  Last BM:  08/19/18 small type 5  Height:   Ht Readings from Last 1 Encounters:  08/16/18 6' (1.829 m)    Weight:   Wt Readings from Last 1 Encounters:  08/20/18 89.1 kg    Ideal Body Weight:  80.9 kg  BMI:  Body mass index is 26.64 kg/m.  Estimated Nutritional Needs:   Kcal:  2200-2400  Protein:  130-145 grams  Fluid:  per MD    Gaynell Face, MS, RD, LDN Inpatient Clinical Dietitian Pager: [phone] Weekend/After Hours: [phone]

## 2018-08-20 NOTE — Progress Notes (Signed)
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  Being admitted 4/2 with acute mental status change, and decompensated biventricular heart failure and shock. Blood cultures growing Enterobacteriaceae. He has had continuing worsening thrombocytopenia w/o signs of bleeding.  History of present illness   53 year old male patient followed at the heart failure clinic with known ejection fraction of 10 to 15% with biventricular heart failure. Presents to the emergency room on 4/2 after being found by a neighbor on the floor at his house.  Reportedly he had just fallen but it is unclear exactly when this occurred as it was unwitnessed.  He was confused, reporting weakness to be generalized.  His weight had increased.  Last weight recorded in the clinic was 94 kg, he weighed 112 kg on presentation.  On EMS arrival his blood glucose was 47.  On arrival he was hypothermic with temperature of 86.9 Fahrenheit hypotensive with systolic blood pressure initially in the 80s but then down into the 50s he had diffuse 3-4+ pitting edema requiring 4 L to keep saturations greater than 90%.  Because of his hypotension, as well as altered sensorium the critical care team was asked to evaluate.  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  Severe mitral valve regurgitation. Diverticulosis Angioedema with ACE inhibitor Iron deficiency anemia Possible cirrhosis  Significant Hospital Events   4/2 admitted with hypoxia, delirium, and decompensated biventricular heart failure with circulatory shock 4/3 started on CRRT for volume removal 4/3 BCID Enterobacteriaceae species> citrobacter freundii  Consults:  HF 4/2 Nephro  4/3  Procedures:  R CVC 4/2>> R femoral HD cath 4/3>>  Significant Diagnostic Tests:  CXR 4/3>> increasing bilateral pulmonary infiltrates RUS 4/3>> no hydronephrosis, large volume ascites CT head 4/4>> no acute findings  Micro Data:  Blood cultures x2/2 > BCID Enterobacteriaceae > citrobacter ferundii U strep 4/2>>> negative u legionella 4/2>>> negative  Antimicrobials:  Levofloxacin 4/2 Vancomycin 4/2>4/3 Cefepmine 4/3>>  Interim history/subjective:  Low urine output after stopping lasix. Still able to pull 200cc/hr with CRRT. He was apparently able to eat a little bit of dinner last night.  Objective   Blood pressure 106/77, pulse (!) 123, temperature (!) 97 F (36.1 C), resp. rate 16, height 6' (1.829 m), weight 89.1 kg, SpO2 100 %. CVP:  [6 mmHg-22 mmHg] 22 mmHg      Intake/Output Summary (Last 24 hours) at 08/20/2018 3016 Last data filed at 08/20/2018 0600 Gross per 24 hour  Intake 1849.02 ml  Output 6939 ml  Net -5089.98 ml   Filed Weights   08/18/18 0500 08/19/18 0500 08/20/18 0600  Weight: 100.6 kg 97.5 kg 89.1 kg    Examination: General: wakes to voice, able to wiggle fingers  HENT: anicteric sclera Lungs: No increased work of breathing, CTAB on anterior lung fields Cardiovascular: RRR, no M/R Abdomen: soft, active bowel sounds Extremities: Anasarca with LE wounds and blisters L>R, anasarca improving Neuro: Generalized weakness, EOMI, PERRL  Resolved Hospital Problem list   Acute Hypoxic Respiratory failure in setting of decompensated HF w/ volume overload/pulm edema  Assessment & Plan:   Cardiogenic and septic shock: HD stable while weaning norepinephrine. On cefepime for gram negative bacteremia to address sepsis. Diuresing and getting  CRRT for volume removal and on dobutamine for biventricular heart failure, however not candidate for other advanced therapies. On stress dose steroids.  Acute decompensated Biventricular Heart failure: Echo reveals EF  20-25% with severe RV systolic failure. Dry weight 64kgs, weight 112kg on admission. Plan On dobutamine and titrate off of levophed as able CRRT for volume removal Daily weight, ins/outs  Citrobacter freundii bacteremia: - Cefepime day 5  AKI, in setting of above, RUS w/o hydronephrosis. Likely ATN. On CRRT for volume removal.  Acute metabolic encephalopathy  CT head negative  Plan Cont supportive care Hold sedating meds  Thrombocytopenia - 50s on admission now 8 Likely related to DIC 2/2 sepsis. S/p 1 unit plts this admission - transfuse 1 unit plts  Refractory Hypoglycemia- not of hypoglycemics at home, likely 2/2 sepsis and cardiac failure Plan Trend hourly glucose  Continue D10 for now, able to tolerate PO intake some which will help; may need cortrak placement today.  Best practice:  Diet: renal with FR if mental status allows Pain/Anxiety/Delirium protocol (if indicated): n/a VAP protocol (if indicated): n/a DVT prophylaxis: SCDs GI prophylaxis: protonix Glucose control: n/a Mobility: BR due to profound weakness Code Status: FULL Family Communication: pending Disposition: ICU  Labs   CBC: Recent Labs  Lab 08/16/18 1333 08/16/18 1611 08/17/18 0353  08/18/18 0207 08/18/18 1751 08/19/18 0413 08/19/18 1115 08/19/18 2047 08/20/18 0414  WBC 4.3 2.2* 4.9  --  6.3 8.1 9.8  --   --  12.3*  NEUTROABS 1.9 2.1  --   --   --  7.6 8.0*  --   --  11.3*  HGB 12.5* 10.9* 10.8*   < > 10.3* 9.9* 9.7*  --  10.2* 10.3*  HCT 40.6 34.0* 34.5*   < > 32.4* 29.9* 30.3*  --  30.0* 32.3*  MCV 92.1 91.2 91.0  --  88.5 89.0 87.1  --   --  87.8  PLT 53* 45* 44*  --  14* 8* 8* 11*  --  8*   < > = values in this interval not displayed.    Basic Metabolic Panel: Recent Labs  Lab 08/16/18 1611  08/17/18 0353  08/17/18 1755 08/18/18 0207 08/18/18 1600 08/19/18 0413 08/19/18 1546 08/19/18 2047 08/20/18 0414  NA 141  --  140   < > 139 140 138 136 137 138 135  K 4.0  --   4.4   < > 4.2 4.2 4.1 4.2 4.1 3.9 4.5  CL 114*  --  112*   < > 110 112* 108 106 105  --  104  CO2 18*  --  17*   < > 19* 22 25 23 26   --  25  GLUCOSE 84   < > 75   < > 146* 81 97 98 102*  --  96  BUN 46*  --  49*   < > 48* 41* 31* 26* 24*  --  23*  CREATININE 1.83*  --  2.15*   < > 1.99* 1.71* 1.39* 1.17 1.17  --  1.07  CALCIUM 8.2*  --  8.2*   < > 7.8* 7.8* 7.7* 7.6* 7.8*  --  8.0*  MG 1.7  --  1.8  --   --  2.2  --  2.0  --   --  2.2  PHOS 3.9  --  3.8  --  4.3  --  2.8 2.3* 4.4  --  2.9   < > = values in this interval not  displayed.   GFR: Estimated Creatinine Clearance: 86.6 mL/min (by C-G formula based on SCr of 1.07 mg/dL). Recent Labs  Lab 08/16/18 1333 08/16/18 1611 08/16/18 1645 08/16/18 2227  08/18/18 0207 08/18/18 1751 08/19/18 0413 08/20/18 0414  PROCALCITON  --  6.76  --   --   --   --   --   --   --   WBC 4.3 2.2*  --   --    < > 6.3 8.1 9.8 12.3*  LATICACIDVEN 3.1*  --  4.3* 4.9*  --   --   --   --   --    < > = values in this interval not displayed.    Liver Function Tests: Recent Labs  Lab 08/16/18 1333 08/16/18 1611  08/18/18 0207 08/18/18 1600 08/19/18 0413 08/19/18 1546 08/20/18 0414  AST 48* 54*  --  37  --   --   --   --   ALT 23 21  --  20  --   --   --   --   ALKPHOS 114 99  --  64  --   --   --   --   BILITOT 3.7* 3.7*  --  4.7*  --   --   --   --   PROT 6.0* 5.2*  --  5.1*  --   --   --   --   ALBUMIN 1.9* 1.7*   < > 1.4* 1.3* 1.4* 1.4* 1.5*   < > = values in this interval not displayed.   Recent Labs  Lab 08/16/18 1611  AMYLASE 28   Recent Labs  Lab 08/16/18 1333 08/17/18 0923  AMMONIA 20 35    ABG    Component Value Date/Time   PHART 7.458 (H) 08/19/2018 2047   PCO2ART 35.9 08/19/2018 2047   PO2ART 76.0 (L) 08/19/2018 2047   HCO3 25.7 08/19/2018 2047   TCO2 27 08/19/2018 2047   ACIDBASEDEF 7.0 (H) 08/17/2018 0912   O2SAT 72.7 08/20/2018 0430     Coagulation Profile: Recent Labs  Lab 08/16/18 1611 08/17/18 0927  08/19/18 1115  INR 4.3* 3.8* 2.2*    Cardiac Enzymes: Recent Labs  Lab 08/16/18 1333  CKTOTAL 194  TROPONINI <0.03    HbA1C: Hgb A1c MFr Bld  Date/Time Value Ref Range Status  09/10/2008 12:40 PM  4.6 - 6.1 % Final   6.0 (NOTE) The ADA recommends the following therapeutic goal for glycemic control related to Hgb A1c measurement: Goal of therapy: <6.5 Hgb A1c  Reference: American Diabetes Association: Clinical Practice Recommendations 2010, Diabetes Care, 2010, 33: (Suppl  1).    CBG: Recent Labs  Lab 08/19/18 1121 08/19/18 1506 08/19/18 2043 08/20/18 0044 08/20/18 0426  GLUCAP 78 100* 78 84 86    Review of Systems:   Denies pain  Past Medical History  He,  has a past medical history of CHF (congestive heart failure) (HCC) and Hypertension.   Surgical History    Past Surgical History:  Procedure Laterality Date  . CARDIAC CATHETERIZATION N/A 04/30/2015   Procedure: Right/Left Heart Cath and Coronary Angiography;  Surgeon: Larey Dresser, MD;  Location: [place] [facility];  Service: Cardiovascular;  Laterality: N/A;  . COLONOSCOPY WITH PROPOFOL N/A 05/01/2015   Procedure: COLONOSCOPY WITH PROPOFOL;  Surgeon: Wilford Corner, MD;  Location: [facility] ENDOSCOPY;  Service: Endoscopy;  Laterality: N/A;  . ESOPHAGOGASTRODUODENOSCOPY (EGD) WITH PROPOFOL N/A 05/01/2015   Procedure: ESOPHAGOGASTRODUODENOSCOPY (EGD) WITH PROPOFOL;  Surgeon: Wilford Corner,  MD;  Location: [facility] ENDOSCOPY;  Service: Endoscopy;  Laterality: N/A;  . INGUINAL HERNIA REPAIR Left 03/24/2018   Procedure: HERNIA REPAIR INGUINAL INCARCERATED;  Surgeon: Georganna Skeans, MD;  Location: [place];  Service: General;  Laterality: Left;  . INSERTION OF MESH Left 03/24/2018   Procedure: INSERTION OF MESH;  Surgeon: Georganna Skeans, MD;  Location: [place];  Service: General;  Laterality: Left;  . SHOULDER SURGERY       Social History   reports that he has never smoked. He has never used smokeless tobacco. He reports  that he does not drink alcohol or use drugs.   Family History   His family history includes Lung cancer in his mother.   Allergies Allergies  Allergen Reactions  . Lisinopril Swelling    REACTION: pt had a swelling episode.  His lips swelled  . Penicillins Hives    Has patient had a PCN reaction causing immediate rash, facial/tongue/throat swelling, SOB or lightheadedness with hypotension: Yes Has patient had a PCN reaction causing severe rash involving mucus membranes or skin necrosis: No Has patient had a PCN reaction that required hospitalization: No Has patient had a PCN reaction occurring within the last 10 years: No If all of the above answers are "NO", then may proceed with Cephalosporin use.     Home Medications  Prior to Admission medications   Medication Sig Start Date End Date Taking? Authorizing Provider  metolazone (ZAROXOLYN) 2.5 MG tablet Take 1 tablet (2.5 mg total) by mouth daily. As directed by heart failure clinic 06/05/18 09/03/18 Yes Tillery, Satira Mccallum, PA-C  potassium chloride 20 MEQ TBCR Take 20 mEq by mouth 2 (two) times daily. 06/05/18  Yes Shirley Friar, PA-C  torsemide (DEMADEX) 20 MG tablet Take 2 tablets (40 mg total) by mouth 2 (two) times daily. 06/05/18  Yes Shirley Friar, PA-C  acetaminophen (TYLENOL) 500 MG tablet You can take 1000 mg every 8 hours as needed for pain.  Can alternate this with ibuprofen, or Tramadol.  Do not exceed 4000 mg of Tylenol(acetaminophen) per day it can harm your liver.  You can buy this over-the-counter at any drugstore. 03/28/18   Earnstine Regal, PA-C  docusate sodium (COLACE) 100 MG capsule Take 1 capsule (100 mg total) by mouth 2 (two) times daily. 03/20/18   Bonnielee Haff, MD  folic acid (FOLVITE) 1 MG tablet Take 1 tablet (1 mg total) by mouth daily. 03/20/18   Bonnielee Haff, MD  ibuprofen (ADVIL,MOTRIN) 200 MG tablet You can take 3 tablets every 6 hours as needed for pain.  You can alternate this  with plain Tylenol or Tramadol.  You can buy this over-the-counter at any drugstore without a prescription. 03/28/18   Earnstine Regal, PA-C  losartan (COZAAR) 25 MG tablet Take 0.5 tablets (12.5 mg total) by mouth at bedtime. 04/10/18   Georgiana Shore, NP  magnesium oxide (MAG-OX) 400 MG tablet Take 1 tablet (400 mg total) by mouth daily. 04/11/18   Georgiana Shore, NP  Multiple Vitamin (MULTIVITAMIN WITH MINERALS) TABS tablet Take 1 tablet by mouth daily. 03/20/18   Bonnielee Haff, MD  polyethylene glycol [facility] / Floria Raveling) packet You can use this for constipation as instructed on the package directions.  You can buy this at any drugstore without a prescription. Patient not taking: Reported on 08/16/2018 03/28/18   Earnstine Regal, PA-C  traMADol (ULTRAM) 50 MG tablet Take 1 tablet (50 mg total) by mouth every 6 (six) hours as needed for moderate pain (  Third line use if pain not relieved by PO Tylenol and Ibuprofen). 03/28/18   Earnstine Regal, PA-C     Alphonzo Grieve, MD IMTS - PGY3 Pager [phone]

## 2018-08-20 NOTE — Progress Notes (Signed)
[facility] Progress Note    Assessment/ Plan:   1. Acute nonoliguric kidney Injury: requiring CRRT for massive volume overload.  Continue present with UF @ 200 mL/ hr.  Etiology likely ATN after having been found down.  2. Severe biventricular failure: per AHF.  ON dobutamine and levophed.  Not a candidate for advanced therapies.  3. Mixed septic and cardiogenic shock: pressors as above, blood cultures 4/2 Citrobacter.  On ceftriaxone, stress dose steroids as well  4. Coagulopathy and thrombocytopenia: appears to have some component of DIC.  Getting plts today  5 Nutrition: severely malnourished with Albumin 1.5, hypoglycemic on D10 gtt, poor PO intake, getting Coretrack today.  6.  Dispo: poor prognosis, recommend palliative care consult.    Subjective:    Still on CRRT.  Plt down, getting plts today.  Tolerating UF of 200 mL /hr.     Objective:   BP (!) 85/69   Pulse 99   Temp (!) 97.2 F (36.2 C)   Resp 14   Ht 6' (1.829 m)   Wt 89.1 kg   SpO2 100%   BMI 26.64 kg/m   Intake/Output Summary (Last 24 hours) at 08/20/2018 0956 Last data filed at 08/20/2018 0900 Gross per 24 hour  Intake 1790.5 ml  Output 7085 ml  Net -5294.5 ml   Weight change: -8.4 kg  Physical Exam: DXA:JOINOMVEH, somnolent CVS: tachycardic, + heave Resp: clear anteriorly Abd: distended, NAD Ext: ++ anasarca MSK: L foot appears dusky, petechiae.  Per RN, dopplerable pulses bilaterally  Imaging: Ct Head Wo Contrast  Result Date: 08/18/2018 CLINICAL DATA:  Altered mental status, unexplained EXAM: CT HEAD WITHOUT CONTRAST TECHNIQUE: Contiguous axial images were obtained from the base of the skull through the vertex without intravenous contrast. COMPARISON:  09/08/2008 FINDINGS: Brain: No evidence of acute infarction, hemorrhage, hydrocephalus, extra-axial collection or mass lesion/mass effect. Premature atrophy. Mild or moderate chronic small vessel ischemia in the cerebral white  matter. Vascular: Atherosclerotic calcification.  No hyperdense vessel. Skull: No acute finding Sinuses/Orbits: Right maxillary sinus fluid level. IMPRESSION: 1. No acute finding. 2. Premature atrophy and chronic small vessel ischemia. 3. Right maxillary sinus fluid level. Electronically Signed   By: Monte Fantasia M.D.   On: 08/18/2018 11:03    Labs: BMET Recent Labs  Lab 08/16/18 1611  08/17/18 0353  08/17/18 1334 08/17/18 1755 08/18/18 0207 08/18/18 1600 08/19/18 0413 08/19/18 1546 08/19/18 2047 08/20/18 0414  NA 141  --  140   < > 138 139 140 138 136 137 138 135  K 4.0  --  4.4   < > 4.5 4.2 4.2 4.1 4.2 4.1 3.9 4.5  CL 114*  --  112*  --  111 110 112* 108 106 105  --  104  CO2 18*  --  17*  --  19* 19* 22 25 23 26   --  25  GLUCOSE 84   < > 75  --  143* 146* 81 97 98 102*  --  96  BUN 46*  --  49*  --  53* 48* 41* 31* 26* 24*  --  23*  CREATININE 1.83*  --  2.15*  --  2.23* 1.99* 1.71* 1.39* 1.17 1.17  --  1.07  CALCIUM 8.2*  --  8.2*  --  7.6* 7.8* 7.8* 7.7* 7.6* 7.8*  --  8.0*  PHOS 3.9  --  3.8  --   --  4.3  --  2.8 2.3* 4.4  --  2.9   < > =  values in this interval not displayed.   CBC Recent Labs  Lab 08/16/18 1611  08/18/18 0207 08/18/18 1751 08/19/18 0413 08/19/18 1115 08/19/18 2047 08/20/18 0414  WBC 2.2*   < > 6.3 8.1 9.8  --   --  12.3*  NEUTROABS 2.1  --   --  7.6 8.0*  --   --  11.3*  HGB 10.9*   < > 10.3* 9.9* 9.7*  --  10.2* 10.3*  HCT 34.0*   < > 32.4* 29.9* 30.3*  --  30.0* 32.3*  MCV 91.2   < > 88.5 89.0 87.1  --   --  87.8  PLT 45*   < > 14* 8* 8* 11*  --  8*   < > = values in this interval not displayed.    Medications:    . sodium chloride   Intravenous Once  . sodium chloride   Intravenous Once  . chlorhexidine  15 mL Mouth Rinse BID  . Chlorhexidine Gluconate Cloth  6 each Topical Daily  . hydrocortisone sodium succinate  25 mg Intravenous Q6H  . mouth rinse  15 mL Mouth Rinse q12n4p  . pantoprazole (PROTONIX) IV  40 mg Intravenous  Q0600  . sodium chloride flush  10-40 mL Intracatheter Q12H  . thiamine injection  100 mg Intravenous Daily      Madelon Lips MD [facility] pgr [phone] 08/20/2018, 9:56 AM

## 2018-08-20 NOTE — Progress Notes (Signed)
Patient ID: Kyle Conrad, male   DOB: 28-Sep-1965, 53 y.o.   MRN: [id number]     Advanced Heart Failure Rounding Note  PCP-Cardiologist: No primary care provider on file.   Subjective:    Patient remains awake and will follow commands but won't speak. Head CT no acute findings. Passed swallow study.  Remains on dobutamine 4 and NE 4 with co-ox 73%. On CVVHD with UF 200 cc/hr. Weight continues to fall. CVP > 20.   Platelets down to 8K again despite transfusion yesterday. No obvious bleeding. Hgb stable. INR and D dimer elevated though no schistocytes and fibrinogen normal.  Afebrile, now on ceftriaxone.  Citrobacter species in blood.    Objective:   Weight Range: 89.1 kg Body mass index is 26.64 kg/m.   Vital Signs:   Temp:  [95.7 F (35.4 C)-98.4 F (36.9 C)] 97.2 F (36.2 C) (04/06 0700) Pulse Rate:  [89-123] 99 (04/06 0700) Resp:  [0-95] 13 (04/06 0700) BP: (81-115)/(43-84) 89/64 (04/06 0700) SpO2:  [92 %-100 %] 100 % (04/06 0700) Weight:  [89.1 kg] 89.1 kg (04/06 0600) Last BM Date: 08/18/18  Weight change: Filed Weights   08/18/18 0500 08/19/18 0500 08/20/18 0600  Weight: 100.6 kg 97.5 kg 89.1 kg    Intake/Output:   Intake/Output Summary (Last 24 hours) at 08/20/2018 0737 Last data filed at 08/20/2018 0700 Gross per 24 hour  Intake 1884.97 ml  Output 6939 ml  Net -5054.03 ml      Physical Exam    General: NAD but will not speak Neck: JVP 16+ cm, no thyromegaly or thyroid nodule.  Lungs: Distant BS CV: Lateral PMI.  Heart regular S1/S2, +S3.  2+ edema to thighs.    Abdomen: Soft, nontender, no hepatosplenomegaly, no distention.  Skin: Intact without lesions or rashes.  Neurologic: Alert and following commands but not talking.  Psych: Normal affect. Extremities: No clubbing or cyanosis.  HEENT: Normal.    Telemetry   Sinus tach 100-110s, low voltage (personally reviewed)  Labs    CBC Recent Labs    08/19/18 0413 08/19/18 1115 08/19/18 2047  08/20/18 0414  WBC 9.8  --   --  12.3*  NEUTROABS 8.0*  --   --  11.3*  HGB 9.7*  --  10.2* 10.3*  HCT 30.3*  --  30.0* 32.3*  MCV 87.1  --   --  87.8  PLT 8* 11*  --  8*   Basic Metabolic Panel Recent Labs    08/19/18 0413 08/19/18 1546 08/19/18 2047 08/20/18 0414  NA 136 137 138 135  K 4.2 4.1 3.9 4.5  CL 106 105  --  104  CO2 23 26  --  25  GLUCOSE 98 102*  --  96  BUN 26* 24*  --  23*  CREATININE 1.17 1.17  --  1.07  CALCIUM 7.6* 7.8*  --  8.0*  MG 2.0  --   --  2.2  PHOS 2.3* 4.4  --  2.9   Liver Function Tests Recent Labs    08/18/18 0207  08/19/18 1546 08/20/18 0414  AST 37  --   --   --   ALT 20  --   --   --   ALKPHOS 64  --   --   --   BILITOT 4.7*  --   --   --   PROT 5.1*  --   --   --   ALBUMIN 1.4*   < > 1.4* 1.5*   < > =  values in this interval not displayed.   No results for input(s): LIPASE, AMYLASE in the last 72 hours. Cardiac Enzymes No results for input(s): CKTOTAL, CKMB, CKMBINDEX, TROPONINI in the last 72 hours.  BNP: BNP (last 3 results) Recent Labs    03/24/18 1435 06/05/18 1411 08/16/18 1611  BNP 2,471.9* 3,961.4* 2,433.3*    ProBNP (last 3 results) No results for input(s): PROBNP in the last 8760 hours.   D-Dimer Recent Labs    08/19/18 1115  DDIMER 3.71*   Hemoglobin A1C No results for input(s): HGBA1C in the last 72 hours. Fasting Lipid Panel No results for input(s): CHOL, HDL, LDLCALC, TRIG, CHOLHDL, LDLDIRECT in the last 72 hours. Thyroid Function Tests No results for input(s): TSH, T4TOTAL, T3FREE, THYROIDAB in the last 72 hours.  Invalid input(s): FREET3  Other results:   Imaging    No results found.   Medications:     Scheduled Medications: . sodium chloride   Intravenous Once  . sodium chloride   Intravenous Once  . chlorhexidine  15 mL Mouth Rinse BID  . Chlorhexidine Gluconate Cloth  6 each Topical Daily  . hydrocortisone sodium succinate  25 mg Intravenous Q6H  . mouth rinse  15 mL Mouth  Rinse q12n4p  . pantoprazole (PROTONIX) IV  40 mg Intravenous Q0600  . sodium chloride flush  10-40 mL Intracatheter Q12H  . thiamine injection  100 mg Intravenous Daily    Infusions: .  prismasol BGK 4/2.5 500 mL/hr at 08/19/18 2328  .  prismasol BGK 4/2.5 500 mL/hr at 08/19/18 2328  . sodium chloride Stopped (08/18/18 0747)  . sodium chloride Stopped (08/19/18 1159)  . cefTRIAXone (ROCEPHIN)  IV Stopped (08/19/18 2212)  . dextrose 25 mL/hr at 08/20/18 0700  . DOBUTamine 4 mcg/kg/min (08/20/18 0700)  . norepinephrine (LEVOPHED) Adult infusion 5 mcg/min (08/20/18 0700)  . prismasol BGK 4/2.5 2,000 mL/hr at 08/20/18 0634    PRN Medications: Place/Maintain arterial line **AND** sodium chloride, lip balm, ondansetron (ZOFRAN) IV, sodium chloride, sodium chloride flush   Assessment/Plan   1. 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  He is markedly volume overloaded with suspected mixed septic/cardiogenic shock.  Echo 4/3 with severe biventricular failure, LVEF 20-25% small effusion, moderate to severe MR and severe TR.  Co-ox 73% today with good MAP on norepinephrine 3/dobutamine 4 with CVP > 20.  CVVH ongoing, pulling 200 cc/hr.   - Continue volume removal per CVVHD, UF 200 cc/hr and tolerating.  - He is not going to be a candidate for advanced therapies.  LVAD likely not an option regardless with severe RV dysfunction and has had very poor compliance with medical therapy.  2. ID: Suspect component of septic shock PCT 6.76.  Possible LLL PNA on CXR.  Citrobacter in blood.  - Continue ceftriaxone.  - Support MAP with NE and dobutamine.  3. AKI: Concern for ATN in setting of shock.  Was down at home for an undetermined length of time.  Now on CVVHD.  - Continue CVVHD, pulling 200 cc/hr and still very volume overloaded.  4.  Thrombocytopenia: Noted at admission, normal plts in the past.  Suspect related to sepsis (normal fibrinogen, no schistocytes in blood).  Transfused 1 unit plts 4/5.  Today, platelets still 8k. No overt bleeding. - Will transfuse 1 unit plts.  5. Hypoglyemia: On D10 gtt at 54. CBGs ok. 6.  Neuro: Confusion/delirium, likely in setting of critical illness/infection. Now also with aphonia.  Head CT 4/4 with no acute changes. Passed swallow study and was interactive with speech team.   7. DVT prophylaxis: Hold [place] heparin with low PLTs. SCDs.  8. FEN: Will need to see if he is eating, if not will need tube feeds.   Prognosis is poor. Need to consider Palliative Care team involvement but not sure he can make that decision at this time   CRITICAL CARE Performed by: Loralie Champagne  Total critical care time: 40 minutes  Critical care time was exclusive of separately billable procedures and treating other patients.  Critical care was necessary to treat or prevent imminent or life-threatening deterioration.  Critical care was time spent personally by me on the following activities: development of treatment plan with patient and/or surrogate as well as nursing, discussions with consultants, evaluation of patient's response to treatment, examination of patient, obtaining history from patient or surrogate, ordering and performing treatments and interventions, ordering and review of laboratory studies, ordering and review of radiographic studies, pulse oximetry and re-evaluation of patient's condition.   Length of Stay: [place], MD  08/20/2018, 7:37 AM  Advanced Heart Failure Team Pager [phone] (M-F; 7a - 4p)  Please contact [facility] for night-coverage after hours (4p -7a ) and weekends on [email or web address]

## 2018-08-20 NOTE — Procedures (Signed)
Cortrak  Tube Type:  Cortrak - 43 inches Tube Location:  Left nare Initial Placement:  Stomach Secured by: Bridle Technique Used to Measure Tube Placement:  Documented cm marking at nare/ corner of mouth Cortrak Secured At:  75 cm    Cortrak Tube Team Note:  Consult received to place a Cortrak feeding tube.   No x-ray is required. RN may begin using tube.   If the tube becomes dislodged please keep the tube and contact the Cortrak team at [email or web address] ([email or web address]) for replacement.  If after hours and replacement cannot be delayed, place a NG tube and confirm placement with an abdominal x-ray.   Koleen Distance MS, RD, LDN Pager #- [phone] Office#- [phone] After Hours Pager: [phone]

## 2018-08-20 NOTE — Consult Note (Addendum)
[facility] Nurse wound consult note Reason for Consult: Consult requested for left buttock and left posterior thigh.  Pt was found down for an unknown period of time prior to admission and has 2 significant deep tissue pressure injuries.  These were noted as blisters when admitted and now have evolved to loose peeling skin which reveals DTPI/unstageable pressure injuries.  Left buttock 10X13, darker colored skin with purple reddish wound bed after blister began peeling, large amt yellow drainage, no odor Left posterior thigh 13X13cm darker colored skin with purple reddish wound bed after blister began peeling, large amt yellow drainage, no odor Left posterior calf with full thickness wound where previous blister has peeled, revealing red moist wound bed; 6X6X.1cm, mod amt yellow drainage, no odor. Left anterior foot with patchy areas of partial thickness skin loss, red sn moist with mod amt yellow drainage, no odor. All 5 toes to left foot dark purple with peeling skin, small amt drainage.  Pulse is obtained with doppler according to the bedside nurse.  Dressing procedure/placement/frequency: Xerofrm gauze to left leg and toes to promote healing. Santyl ointment to provide enzymatic debridement of nonviable tissue to buttock and left thigh. Pt is on a low airloss bed to reduce pressure and has Prevalon boots to BLE.  They are critically ill with multiple systemic factors which can impair healing; deep tissue injuries are high risk to evolve into full thickness tissue loss. [facility] team will reassess wound appearance weekly and adjust plan of care accordingly.  Julien Girt MSN, RN, [place], [place], [place]

## 2018-08-21 DIAGNOSIS — R6521 Severe sepsis with septic shock: Secondary | ICD-10-CM

## 2018-08-21 DIAGNOSIS — Z515 Encounter for palliative care: Secondary | ICD-10-CM

## 2018-08-21 DIAGNOSIS — A419 Sepsis, unspecified organism: Secondary | ICD-10-CM

## 2018-08-21 DIAGNOSIS — A498 Other bacterial infections of unspecified site: Secondary | ICD-10-CM

## 2018-08-21 DIAGNOSIS — Z7189 Other specified counseling: Secondary | ICD-10-CM

## 2018-08-21 LAB — CBC WITH DIFFERENTIAL/PLATELET
Abs Immature Granulocytes: 0.09 10*3/uL — ABNORMAL HIGH (ref 0.00–0.07)
Basophils Absolute: 0 10*3/uL (ref 0.0–0.1)
Basophils Relative: 0 %
Eosinophils Absolute: 0 10*3/uL (ref 0.0–0.5)
Eosinophils Relative: 0 %
HCT: 31.9 % — ABNORMAL LOW (ref 39.0–52.0)
Hemoglobin: 10.5 g/dL — ABNORMAL LOW (ref 13.0–17.0)
Immature Granulocytes: 1 %
Lymphocytes Relative: 4 %
Lymphs Abs: 0.4 10*3/uL — ABNORMAL LOW (ref 0.7–4.0)
MCH: 28.9 pg (ref 26.0–34.0)
MCHC: 32.9 g/dL (ref 30.0–36.0)
MCV: 87.9 fL (ref 80.0–100.0)
Monocytes Absolute: 0.4 10*3/uL (ref 0.1–1.0)
Monocytes Relative: 4 %
Neutro Abs: 9.1 10*3/uL — ABNORMAL HIGH (ref 1.7–7.7)
Neutrophils Relative %: 91 %
Platelets: 10 10*3/uL — CL (ref 150–400)
RBC: 3.63 MIL/uL — ABNORMAL LOW (ref 4.22–5.81)
RDW: 17.2 % — ABNORMAL HIGH (ref 11.5–15.5)
WBC: 10 10*3/uL (ref 4.0–10.5)
nRBC: 0.4 % — ABNORMAL HIGH (ref 0.0–0.2)

## 2018-08-21 LAB — CBC
HCT: 30.6 % — ABNORMAL LOW (ref 39.0–52.0)
Hemoglobin: 9.9 g/dL — ABNORMAL LOW (ref 13.0–17.0)
MCH: 28.2 pg (ref 26.0–34.0)
MCHC: 32.4 g/dL (ref 30.0–36.0)
MCV: 87.2 fL (ref 80.0–100.0)
Platelets: 11 10*3/uL — CL (ref 150–400)
RBC: 3.51 MIL/uL — ABNORMAL LOW (ref 4.22–5.81)
RDW: 17.4 % — ABNORMAL HIGH (ref 11.5–15.5)
WBC: 9.4 10*3/uL (ref 4.0–10.5)
nRBC: 0 % (ref 0.0–0.2)

## 2018-08-21 LAB — PREPARE PLATELET PHERESIS: Unit division: 0

## 2018-08-21 LAB — PREPARE FRESH FROZEN PLASMA

## 2018-08-21 LAB — RENAL FUNCTION PANEL
Albumin: 1.5 g/dL — ABNORMAL LOW (ref 3.5–5.0)
Anion gap: 6 (ref 5–15)
BUN: 26 mg/dL — ABNORMAL HIGH (ref 6–20)
CO2: 29 mmol/L (ref 22–32)
Calcium: 8.3 mg/dL — ABNORMAL LOW (ref 8.9–10.3)
Chloride: 102 mmol/L (ref 98–111)
Creatinine, Ser: 1.06 mg/dL (ref 0.61–1.24)
GFR calc Af Amer: >60 mL/min (ref >60)
GFR calc non Af Amer: >60 mL/min (ref >60)
Glucose, Bld: 133 mg/dL — ABNORMAL HIGH (ref 70–99)
Phosphorus: 2.4 mg/dL — ABNORMAL LOW (ref 2.5–4.6)
Potassium: 3.5 mmol/L (ref 3.5–5.1)
Sodium: 137 mmol/L (ref 135–145)

## 2018-08-21 LAB — GLUCOSE, CAPILLARY
Glucose-Capillary: 103 mg/dL — ABNORMAL HIGH (ref 70–99)
Glucose-Capillary: 110 mg/dL — ABNORMAL HIGH (ref 70–99)
Glucose-Capillary: 113 mg/dL — ABNORMAL HIGH (ref 70–99)
Glucose-Capillary: 119 mg/dL — ABNORMAL HIGH (ref 70–99)
Glucose-Capillary: 124 mg/dL — ABNORMAL HIGH (ref 70–99)
Glucose-Capillary: 127 mg/dL — ABNORMAL HIGH (ref 70–99)
Glucose-Capillary: 95 mg/dL (ref 70–99)

## 2018-08-21 LAB — COOXEMETRY PANEL
Carboxyhemoglobin: 1.8 % — ABNORMAL HIGH (ref 0.5–1.5)
Methemoglobin: 1.9 % — ABNORMAL HIGH (ref 0.0–1.5)
O2 Saturation: 69.9 %
Total hemoglobin: 11.1 g/dL — ABNORMAL LOW (ref 12.0–16.0)

## 2018-08-21 LAB — BPAM FFP
Blood Product Expiration Date: 202004112359
Blood Product Expiration Date: 202004112359
ISSUE DATE / TIME: 202004061311
ISSUE DATE / TIME: 202004061510
Unit Type and Rh: 1700
Unit Type and Rh: 7300

## 2018-08-21 LAB — BPAM PLATELET PHERESIS
Blood Product Expiration Date: 202004062359
ISSUE DATE / TIME: 202004060832
Unit Type and Rh: 6200

## 2018-08-21 LAB — CULTURE, BLOOD (ROUTINE X 2)
Culture: NO GROWTH
Special Requests: ADEQUATE

## 2018-08-21 LAB — MAGNESIUM: Magnesium: 2.2 mg/dL (ref 1.7–2.4)

## 2018-08-21 MED ORDER — POLYETHYLENE GLYCOL 3350 17 G PO PACK
17.0000 g | PACK | Freq: Every day | ORAL | Status: DC
  Administered 2018-08-22 – 2018-09-07 (×9): 17 g via ORAL
  Filled 2018-08-21 (×13): qty 1

## 2018-08-21 MED ORDER — PANTOPRAZOLE SODIUM 40 MG PO PACK
40.0000 mg | PACK | Freq: Every day | ORAL | Status: DC
  Administered 2018-08-22 – 2018-08-30 (×9): 40 mg
  Filled 2018-08-21 (×9): qty 20

## 2018-08-21 MED ORDER — VITAMIN B-1 100 MG PO TABS
100.0000 mg | ORAL_TABLET | Freq: Every day | ORAL | Status: DC
  Administered 2018-08-21 – 2018-09-20 (×31): 100 mg
  Filled 2018-08-21 (×31): qty 1

## 2018-08-21 MED ORDER — SENNA 8.6 MG PO TABS: 1.0000 | ORAL_TABLET | Freq: Every day | ORAL | Status: DC | PRN

## 2018-08-21 NOTE — Progress Notes (Signed)
Patient ID: Kyle Conrad, male   DOB: August 07, 1965, 53 y.o.   MRN: [id number]     Advanced Heart Failure Rounding Note  PCP-Cardiologist: No primary care provider on file.   Subjective:    Patient awake and alert, answering yes/no questions.   Remains on dobutamine 4 and NE 4 with co-ox 70%. On CVVHD with UF 200 cc/hr. Weight continues to fall. CVP 6 this morning.  He had 835 cc UOP yesterday.   Platelets 10K again despite transfusion #2 on 4/6. No obvious bleeding. Hgb stable. INR and D dimer elevated though no schistocytes and fibrinogen normal.  Afebrile, now on ceftriaxone.  Initial blood cultures with Citrobacter freundii.    Objective:   Weight Range: 83.7 kg Body mass index is 25.03 kg/m.   Vital Signs:   Temp:  [96.6 F (35.9 C)-97.5 F (36.4 C)] 97.5 F (36.4 C) (04/07 0700) Pulse Rate:  [84-128] 101 (04/07 0700) Resp:  [8-40] 21 (04/07 0700) BP: (85-119)/(60-86) 97/67 (04/07 0700) SpO2:  [86 %-100 %] 99 % (04/07 0700) Weight:  [83.7 kg] 83.7 kg (04/07 0500) Last BM Date: 08/19/18  Weight change: Filed Weights   08/19/18 0500 08/20/18 0600 08/21/18 0500  Weight: 97.5 kg 89.1 kg 83.7 kg    Intake/Output:   Intake/Output Summary (Last 24 hours) at 08/21/2018 0802 Last data filed at 08/21/2018 0700 Gross per 24 hour  Intake 2302.76 ml  Output 6686 ml  Net -4383.24 ml      Physical Exam    General: NAD Neck: JVP not elevated, no thyromegaly or thyroid nodule.  Lungs: Mild crackles at bases.  CV: Lateral PMI.  Heart regular S1/S2, no S3, 2/6 HSM LLSB/apex.  Trace lower leg edema.   Abdomen: Soft, nontender, no hepatosplenomegaly, no distention.  Skin: Intact without lesions or rashes.  Neurologic: Alert and oriented x 3.  Psych: Normal affect. Extremities: No clubbing or cyanosis.  HEENT: Normal.    Telemetry   NSR 90s (personally reviewed)  Labs    CBC Recent Labs    08/20/18 0414 08/21/18 0452  WBC 12.3* 10.0  NEUTROABS 11.3* 9.1*  HGB  10.3* 10.5*  HCT 32.3* 31.9*  MCV 87.8 87.9  PLT 8* 10*   Basic Metabolic Panel Recent Labs    08/20/18 0414 08/20/18 1806 08/21/18 0451  NA 135 137 137  K 4.5 3.9 3.5  CL 104 104 102  CO2 25 26 29   GLUCOSE 96 129* 133*  BUN 23* 23* 26*  CREATININE 1.07 1.07 1.06  CALCIUM 8.0* 8.4* 8.3*  MG 2.2  --  2.2  PHOS 2.9 2.7 2.4*   Liver Function Tests Recent Labs    08/20/18 1806 08/21/18 0451  ALBUMIN 1.7* 1.5*   No results for input(s): LIPASE, AMYLASE in the last 72 hours. Cardiac Enzymes No results for input(s): CKTOTAL, CKMB, CKMBINDEX, TROPONINI in the last 72 hours.  BNP: BNP (last 3 results) Recent Labs    03/24/18 1435 06/05/18 1411 08/16/18 1611  BNP 2,471.9* 3,961.4* 2,433.3*    ProBNP (last 3 results) No results for input(s): PROBNP in the last 8760 hours.   D-Dimer Recent Labs    08/19/18 1115  DDIMER 3.71*   Hemoglobin A1C No results for input(s): HGBA1C in the last 72 hours. Fasting Lipid Panel No results for input(s): CHOL, HDL, LDLCALC, TRIG, CHOLHDL, LDLDIRECT in the last 72 hours. Thyroid Function Tests No results for input(s): TSH, T4TOTAL, T3FREE, THYROIDAB in the last 72 hours.  Invalid input(s): FREET3  Other results:  Imaging    No results found.   Medications:     Scheduled Medications: . chlorhexidine  15 mL Mouth Rinse BID  . Chlorhexidine Gluconate Cloth  6 each Topical Daily  . collagenase   Topical Daily  . feeding supplement (PRO-STAT SUGAR FREE 64)  60 mL Per Tube BID  . hydrocortisone sodium succinate  25 mg Intravenous Q6H  . mouth rinse  15 mL Mouth Rinse q12n4p  . [START ON 08/22/2018] pantoprazole sodium  40 mg Per Tube Daily  . sodium chloride flush  10-40 mL Intracatheter Q12H  . thiamine injection  100 mg Intravenous Daily    Infusions: .  prismasol BGK 4/2.5 500 mL/hr at 08/21/18 0600  .  prismasol BGK 4/2.5 500 mL/hr at 08/21/18 0533  . sodium chloride Stopped (08/18/18 0747)  . sodium chloride  Stopped (08/19/18 1159)  . cefTRIAXone (ROCEPHIN)  IV Stopped (08/20/18 2216)  . dextrose 25 mL/hr at 08/21/18 0600  . DOBUTamine 4 mcg/kg/min (08/21/18 0600)  . feeding supplement (VITAL 1.5 CAL) 40 mL/hr at 08/21/18 0000  . norepinephrine (LEVOPHED) Adult infusion 4 mcg/min (08/21/18 0600)  . prismasol BGK 4/2.5 2,000 mL/hr at 08/21/18 0532    PRN Medications: Place/Maintain arterial line **AND** sodium chloride, lip balm, ondansetron (ZOFRAN) IV, sodium chloride, sodium chloride flush   Assessment/Plan   1. 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  He is markedly volume overloaded with suspected mixed septic/cardiogenic shock.  Echo 4/3 with severe biventricular failure, LVEF 20-25% small effusion, moderate to severe MR and severe TR.  Co-ox 70% today with stable MAP on norepinephrine 4/dobutamine 4. CVVH ongoing, pulling 200 cc/hr.  CVP much improved, down to 6 on my measure.  - Decrease UF by CVVH to 70 cc/hr (should keep him near even).  Will now need to follow for renal recovery.  - Continue dobutamine at 4 for now, wean norepinephrine as able today.  Hopefully can stop in absence of aggressive UF.  - He is not going to be a candidate for advanced therapies.  LVAD likely not an option regardless with severe RV dysfunction and has had very poor compliance with medical therapy.  2. ID: Suspect component of septic shock PCT 6.76.  Possible LLL PNA on CXR.  Citrobacter freundii in blood.  - Continue ceftriaxone.  3. AKI: Concern for ATN in setting of shock.  Was down at home for an undetermined length of time.  Now on CVVHD. CVP down to 6, decreasing UF on CVVH to 70 cc/hr (run close to even).  Will now need to follow for renal recovery (made 835 cc urine yesterday).   4. Thrombocytopenia: Noted at admission, normal plts in the past.   Suspect related to sepsis (normal fibrinogen, no schistocytes in blood).  Transfused 1 unit plts 4/5 and again on 4/6.  Today, platelets still 10k. No overt bleeding.  - Will follow platelet count for now, repeat in pm and transfuse if falling.   5. Hypoglyemia: Can stop D10 gtt today.  6. Neuro: Confusion/delirium, likely in setting of critical illness/infection. Now also with aphonia.  Head CT 4/4 with no acute changes. Passed swallow study.  He is more vocal this morning.   7. DVT prophylaxis: Hold [place] heparin with low PLTs. SCDs.  8. FEN: Continue tube feeds via Cortrack.    Prognosis is poor. Need to consider Palliative Care team  involvement but not sure he can make that decision at this time   CRITICAL CARE Performed by: Loralie Champagne  Total critical care time: 40 minutes  Critical care time was exclusive of separately billable procedures and treating other patients.  Critical care was necessary to treat or prevent imminent or life-threatening deterioration.  Critical care was time spent personally by me on the following activities: development of treatment plan with patient and/or surrogate as well as nursing, discussions with consultants, evaluation of patient's response to treatment, examination of patient, obtaining history from patient or surrogate, ordering and performing treatments and interventions, ordering and review of laboratory studies, ordering and review of radiographic studies, pulse oximetry and re-evaluation of patient's condition.   Length of Stay: [place], MD  08/21/2018, 8:02 AM  Advanced Heart Failure Team Pager [phone] (M-F; 7a - 4p)  Please contact [facility] for night-coverage after hours (4p -7a ) and weekends on [email or web address]

## 2018-08-21 NOTE — Progress Notes (Signed)
PT Cancellation Note  Patient Details Name: Kyle Conrad MRN: [id number] DOB: 1965/11/11   Cancelled Treatment:    Reason Eval/Treat Not Completed: Patient not medically ready. Pt on CRRT via line through R groin. Pt unable to participate in therapy at this time. Acute PT to return as able once pt is off CRRT or other activity orders are given.  Kittie Plater, PT, DPT Acute Rehabilitation Services Pager #: [phone] Office #: [phone]    Berline Lopes 08/21/2018, 12:23 PM

## 2018-08-21 NOTE — Progress Notes (Addendum)
Palliative:  Consult received and chart reviewed. Spoke with social work - so far, no Land have been identified for Kyle Conrad. Also, spoke with RN who confirms that Kyle Conrad is unable to make decisions for himself at this time due to altered mental status. Called only contact in chart - a friend named Kyle Conrad - however, no answer - left a voicemail with number to return my call. Hopeful she has contacts for Kyle Conrad family.  Until either decision maker is found or Kyle Conrad is able to participate in goals of care decisions - unable to determine goals of care.  UPDATE: Called back by [facility]. She tells me she is a long-term friend of Kyle Conrad; however, they have not spoken in several months. She was unaware he was hospitalized. She tells me Kyle Conrad is not married and does not have children. She believes his next of kin is a cousin - she is going to look for contact information and will hopefully call back when she finds it.   Thank you for this consult.  Juel Burrow, DNP, AGNP-C Palliative Medicine Team Team Phone # [phone]  Pager # [phone]  NO CHARGE

## 2018-08-21 NOTE — Progress Notes (Signed)
  Speech Language Pathology Treatment: Dysphagia  Patient Details Name: Kyle Conrad MRN: [id number] DOB: [birth year] Today's Date: 08/21/2018 Time: 8937-3428 SLP Time Calculation (min) (ACUTE ONLY): 9 min  Assessment / Plan / Recommendation Clinical Impression  Pt is alert but only appears to respond to yes/no questions even for basic questioning, such as orientation to self. He needed Mod cues during PO trials to maintain a head upright posture, otherwise wanting to tilt his head to the right. He had a single, delayed cough but no other signs of aspiration and no obvious trouble with oral preparation/clearance. RN denies any trouble with PO intake earlier today, just with limited quantities. Recommend continuing current diet with additional, perhaps brief SLP f/u warranted.   HPI HPI: 53 year old man who remains critically ill due to mixed distributive and cardiogenic shock. Evolving AKI due to cardiorenal syndrome. Enterobacter bacteremia. Hypoglycemia due to malnutrition. Cardiomegaly, vascular congestion with bibasilar infiltrates. Pt noted to have difficulty talking, CT head ordered      SLP Plan  Continue with current plan of care       Recommendations  Diet recommendations: Regular;Thin liquid Liquids provided via: Cup;Straw Medication Administration: Whole meds with liquid Supervision: Staff to assist with self feeding;Full supervision/cueing for compensatory strategies Compensations: Slow rate;Small sips/bites Postural Changes and/or Swallow Maneuvers: Seated upright 90 degrees;Upright 30-60 min after meal                Oral Care Recommendations: Oral care BID Follow up Recommendations: Skilled Nursing facility SLP Visit Diagnosis: Dysphagia, unspecified (R13.10) Plan: Continue with current plan of care       GO                Venita Sheffield Danna Sewell 08/21/2018, 10:37 AM  Pollyann Glen, M.A. [facility] Acute Environmental education officer [phone] Office  [phone]

## 2018-08-21 NOTE — Progress Notes (Signed)
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  Being admitted 4/2 with acute mental status change, and decompensated biventricular heart failure and shock. Blood cultures growing Enterobacteriaceae. He has had continuing worsening thrombocytopenia w/o signs of bleeding.  History of present illness   53 year old male patient followed at the heart failure clinic with known ejection fraction of 10 to 15% with biventricular heart failure. Presents to the emergency room on 4/2 after being found by a neighbor on the floor at his house.  Reportedly he had just fallen but it is unclear exactly when this occurred as it was unwitnessed.  He was confused, reporting weakness to be generalized.  His weight had increased.  Last weight recorded in the clinic was 94 kg, he weighed 112 kg on presentation.  On EMS arrival his blood glucose was 47.  On arrival he was hypothermic with temperature of 86.9 Fahrenheit hypotensive with systolic blood pressure initially in the 80s but then down into the 50s he had diffuse 3-4+ pitting edema requiring 4 L to keep saturations greater than 90%.  Because of his hypotension, as well as altered sensorium the critical care team was asked to evaluate.  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  Severe mitral valve regurgitation. Diverticulosis Angioedema with ACE inhibitor Iron deficiency anemia Possible cirrhosis  Significant Hospital Events   4/2 admitted with hypoxia, delirium, and decompensated biventricular heart failure with circulatory shock 4/3 started on CRRT for volume removal 4/3 BCID Enterobacteriaceae species> citrobacter freundii  Consults:  HF 4/2 Nephro  4/3  Procedures:  R CVC 4/2>> R femoral HD cath 4/3>>  Significant Diagnostic Tests:  CXR 4/3>> increasing bilateral pulmonary infiltrates RUS 4/3>> no hydronephrosis, large volume ascites CT head 4/4>> no acute findings  Micro Data:  Blood cultures x2/2 > BCID Enterobacteriaceae > citrobacter ferundii U strep 4/2>>> negative u legionella 4/2>>> negative  Antimicrobials:  Levofloxacin 4/2 Vancomycin 4/2>4/3 Cefepamine 4/3>>4/5 Ceftriaxone 4/5>>  Interim history/subjective:  UOP 835 in last 24hrs. More awake this morning and vocal; able to state his name but disoriented otherwise. Per RN he has been more oriented on their evaluation.  Objective   Blood pressure 101/74, pulse 97, temperature 97.7 F (36.5 C), resp. rate (!) 32, height 6' (1.829 m), weight 83.7 kg, SpO2 98 %. CVP:  [10 mmHg-19 mmHg] 10 mmHg      Intake/Output Summary (Last 24 hours) at 08/21/2018 0856 Last data filed at 08/21/2018 0800 Gross per 24 hour  Intake 2372.74 ml  Output 7079 ml  Net -4706.26 ml   Filed Weights   08/19/18 0500 08/20/18 0600 08/21/18 0500  Weight: 97.5 kg 89.1 kg 83.7 kg    Examination: General: awake   HENT: anicteric sclera, EOMI Lungs: No increased work of breathing, CTAB on anterior lung fields Cardiovascular: RRR, no M/R Abdomen: soft, active bowel sounds Extremities: Anasarca with LE wounds and blisters L>R, anasarca improving Neuro: Generalized weakness, EOMI, PERRL, able to follow commands to move all 4 extremities however with profound weakness. More vocal today, oriented only to self.  Resolved Hospital Problem list   Acute Hypoxic Respiratory failure in setting of decompensated HF w/ volume overload/pulm edema  Assessment & Plan:   Cardiogenic  and septic shock: HD stable while weaning norepinephrine. On cefepime for gram negative bacteremia to address sepsis. Diuresing and getting CRRT for volume removal and on dobutamine for biventricular heart failure, however  not candidate for other advanced therapies. On stress dose steroids.  Acute decompensated Biventricular Heart failure: Echo reveals EF 20-25% with severe RV systolic failure. Dry weight 64kgs, weight 112kg on admission. Plan On dobutamine and titrate off of levophed as able CRRT for volume removal, decrease UF to 70cc/hr per HF Daily weight, ins/outs  Citrobacter freundii bacteremia: - Ceftriaxone day 6  AKI, in setting of above, RUS w/o hydronephrosis. Likely ATN. On CRRT for volume removal, nephro expects another 48 hours or so and don't think he is an iHD candidate if renal recovery does not occur.  Acute metabolic encephalopathy  CT head negative. Slow improvement; likely due to ongoing medical problems and malnutrition.  Plan Cont supportive care Hold sedating meds  Thrombocytopenia - 50s on admission now 8 Likely related to DIC 2/2 sepsis. S/p 2 units plts and 2 units FFP this admission - goal plt >10 unless active bleeding  Refractory Hypoglycemia- not of hypoglycemics at home, likely 2/2 sepsis and cardiac failure Plan Stop D10 infusion; suspect he will be able to maintain his glucose now that he is getting tube feeds.   Consulted SW to help in finding next of kin. Palliative care consulted as overall poor prognosis; hopeful that patient's encephalopathy will improve so he can participate in discussion, however that is not the case at this time.   Best practice:  Diet: TF Pain/Anxiety/Delirium protocol (if indicated): n/a VAP protocol (if indicated): n/a DVT prophylaxis: SCDs GI prophylaxis: protonix Glucose control: n/a Mobility: BR due to profound weakness Code Status: FULL Family Communication: pending Disposition: ICU  Labs   CBC: Recent Labs  Lab 08/16/18 1611  08/18/18 0207 08/18/18 1751 08/19/18 0413 08/19/18 1115 08/19/18 2047 08/20/18 0414 08/21/18 0452  WBC 2.2*   < > 6.3 8.1 9.8  --   --  12.3* 10.0  NEUTROABS 2.1  --   --  7.6 8.0*  --    --  11.3* 9.1*  HGB 10.9*   < > 10.3* 9.9* 9.7*  --  10.2* 10.3* 10.5*  HCT 34.0*   < > 32.4* 29.9* 30.3*  --  30.0* 32.3* 31.9*  MCV 91.2   < > 88.5 89.0 87.1  --   --  87.8 87.9  PLT 45*   < > 14* 8* 8* 11*  --  8* 10*   < > = values in this interval not displayed.    Basic Metabolic Panel: Recent Labs  Lab 08/17/18 0353  08/18/18 0207  08/19/18 0413 08/19/18 1546 08/19/18 2047 08/20/18 0414 08/20/18 1806 08/21/18 0451  NA 140   < > 140   < > 136 137 138 135 137 137  K 4.4   < > 4.2   < > 4.2 4.1 3.9 4.5 3.9 3.5  CL 112*   < > 112*   < > 106 105  --  104 104 102  CO2 17*   < > 22   < > 23 26  --  25 26 29   GLUCOSE 75   < > 81   < > 98 102*  --  96 129* 133*  BUN 49*   < > 41*   < > 26* 24*  --  23* 23* 26*  CREATININE 2.15*   < > 1.71*   < >  1.17 1.17  --  1.07 1.07 1.06  CALCIUM 8.2*   < > 7.8*   < > 7.6* 7.8*  --  8.0* 8.4* 8.3*  MG 1.8  --  2.2  --  2.0  --   --  2.2  --  2.2  PHOS 3.8   < >  --    < > 2.3* 4.4  --  2.9 2.7 2.4*   < > = values in this interval not displayed.   GFR: Estimated Creatinine Clearance: 87.4 mL/min (by C-G formula based on SCr of 1.06 mg/dL). Recent Labs  Lab 08/16/18 1333 08/16/18 1611 08/16/18 1645 08/16/18 2227  08/18/18 1751 08/19/18 0413 08/20/18 0414 08/21/18 0452  PROCALCITON  --  6.76  --   --   --   --   --   --   --   WBC 4.3 2.2*  --   --    < > 8.1 9.8 12.3* 10.0  LATICACIDVEN 3.1*  --  4.3* 4.9*  --   --   --   --   --    < > = values in this interval not displayed.    Liver Function Tests: Recent Labs  Lab 08/16/18 1333 08/16/18 1611  08/18/18 0207  08/19/18 0413 08/19/18 1546 08/20/18 0414 08/20/18 1806 08/21/18 0451  AST 48* 54*  --  37  --   --   --   --   --   --   ALT 23 21  --  20  --   --   --   --   --   --   ALKPHOS 114 99  --  64  --   --   --   --   --   --   BILITOT 3.7* 3.7*  --  4.7*  --   --   --   --   --   --   PROT 6.0* 5.2*  --  5.1*  --   --   --   --   --   --   ALBUMIN 1.9* 1.7*   < >  1.4*   < > 1.4* 1.4* 1.5* 1.7* 1.5*   < > = values in this interval not displayed.   Recent Labs  Lab 08/16/18 1611  AMYLASE 28   Recent Labs  Lab 08/16/18 1333 08/17/18 0923  AMMONIA 20 35    ABG    Component Value Date/Time   PHART 7.458 (H) 08/19/2018 2047   PCO2ART 35.9 08/19/2018 2047   PO2ART 76.0 (L) 08/19/2018 2047   HCO3 25.7 08/19/2018 2047   TCO2 27 08/19/2018 2047   ACIDBASEDEF 7.0 (H) 08/17/2018 0912   O2SAT 69.9 08/21/2018 0440     Coagulation Profile: Recent Labs  Lab 08/16/18 1611 08/17/18 0927 08/19/18 1115  INR 4.3* 3.8* 2.2*    Cardiac Enzymes: Recent Labs  Lab 08/16/18 1333  CKTOTAL 194  TROPONINI <0.03    HbA1C: Hgb A1c MFr Bld  Date/Time Value Ref Range Status  09/10/2008 12:40 PM  4.6 - 6.1 % Final   6.0 (NOTE) The ADA recommends the following therapeutic goal for glycemic control related to Hgb A1c measurement: Goal of therapy: <6.5 Hgb A1c  Reference: American Diabetes Association: Clinical Practice Recommendations 2010, Diabetes Care, 2010, 33: (Suppl  1).    CBG: Recent Labs  Lab 08/20/18 0819 08/20/18 1208 08/20/18 1601 08/20/18 2045 08/21/18 0058  GLUCAP 94 97 111* 116* 113*    Review  of Systems:   Denies chest pain, shortness of breath. Able to follow some commands and moves all 4 extremities however still with profound weakness.  Past Medical History  He,  has a past medical history of CHF (congestive heart failure) ([place]) and Hypertension.   Surgical History    Past Surgical History:  Procedure Laterality Date   CARDIAC CATHETERIZATION N/A 04/30/2015   Procedure: Right/Left Heart Cath and Coronary Angiography;  Surgeon: Larey Dresser, MD;  Location: [place] [facility];  Service: Cardiovascular;  Laterality: N/A;   COLONOSCOPY WITH PROPOFOL N/A 05/01/2015   Procedure: COLONOSCOPY WITH PROPOFOL;  Surgeon: Wilford Corner, MD;  Location: [facility] ENDOSCOPY;  Service: Endoscopy;  Laterality: N/A;    ESOPHAGOGASTRODUODENOSCOPY (EGD) WITH PROPOFOL N/A 05/01/2015   Procedure: ESOPHAGOGASTRODUODENOSCOPY (EGD) WITH PROPOFOL;  Surgeon: Wilford Corner, MD;  Location: [facility] ENDOSCOPY;  Service: Endoscopy;  Laterality: N/A;   INGUINAL HERNIA REPAIR Left 03/24/2018   Procedure: HERNIA REPAIR INGUINAL INCARCERATED;  Surgeon: Georganna Skeans, MD;  Location: [place];  Service: General;  Laterality: Left;   INSERTION OF MESH Left 03/24/2018   Procedure: INSERTION OF MESH;  Surgeon: Georganna Skeans, MD;  Location: [place];  Service: General;  Laterality: Left;   SHOULDER SURGERY       Social History   reports that he has never smoked. He has never used smokeless tobacco. He reports that he does not drink alcohol or use drugs.   Family History   His family history includes Lung cancer in his mother.   Allergies Allergies  Allergen Reactions   Lisinopril Swelling    REACTION: pt had a swelling episode.  His lips swelled   Penicillins Hives    Has patient had a PCN reaction causing immediate rash, facial/tongue/throat swelling, SOB or lightheadedness with hypotension: Yes Has patient had a PCN reaction causing severe rash involving mucus membranes or skin necrosis: No Has patient had a PCN reaction that required hospitalization: No Has patient had a PCN reaction occurring within the last 10 years: No If all of the above answers are "NO", then may proceed with Cephalosporin use.     Home Medications  Prior to Admission medications   Medication Sig Start Date End Date Taking? Authorizing Provider  metolazone (ZAROXOLYN) 2.5 MG tablet Take 1 tablet (2.5 mg total) by mouth daily. As directed by heart failure clinic 06/05/18 09/03/18 Yes Tillery, Satira Mccallum, PA-C  potassium chloride 20 MEQ TBCR Take 20 mEq by mouth 2 (two) times daily. 06/05/18  Yes Shirley Friar, PA-C  torsemide (DEMADEX) 20 MG tablet Take 2 tablets (40 mg total) by mouth 2 (two) times daily. 06/05/18  Yes Shirley Friar, PA-C  acetaminophen (TYLENOL) 500 MG tablet You can take 1000 mg every 8 hours as needed for pain.  Can alternate this with ibuprofen, or Tramadol.  Do not exceed 4000 mg of Tylenol(acetaminophen) per day it can harm your liver.  You can buy this over-the-counter at any drugstore. 03/28/18   Earnstine Regal, PA-C  docusate sodium (COLACE) 100 MG capsule Take 1 capsule (100 mg total) by mouth 2 (two) times daily. 03/20/18   Bonnielee Haff, MD  folic acid (FOLVITE) 1 MG tablet Take 1 tablet (1 mg total) by mouth daily. 03/20/18   Bonnielee Haff, MD  ibuprofen (ADVIL,MOTRIN) 200 MG tablet You can take 3 tablets every 6 hours as needed for pain.  You can alternate this with plain Tylenol or Tramadol.  You can buy this over-the-counter at any drugstore without  a prescription. 03/28/18   Earnstine Regal, PA-C  losartan (COZAAR) 25 MG tablet Take 0.5 tablets (12.5 mg total) by mouth at bedtime. 04/10/18   Georgiana Shore, NP  magnesium oxide (MAG-OX) 400 MG tablet Take 1 tablet (400 mg total) by mouth daily. 04/11/18   Georgiana Shore, NP  Multiple Vitamin (MULTIVITAMIN WITH MINERALS) TABS tablet Take 1 tablet by mouth daily. 03/20/18   Bonnielee Haff, MD  polyethylene glycol [facility] / Floria Raveling) packet You can use this for constipation as instructed on the package directions.  You can buy this at any drugstore without a prescription. Patient not taking: Reported on 08/16/2018 03/28/18   Earnstine Regal, PA-C  traMADol (ULTRAM) 50 MG tablet Take 1 tablet (50 mg total) by mouth every 6 (six) hours as needed for moderate pain (Third line use if pain not relieved by PO Tylenol and Ibuprofen). 03/28/18   Earnstine Regal, PA-C     Alphonzo Grieve, MD IMTS - PGY3 Pager [phone]

## 2018-08-21 NOTE — Progress Notes (Signed)
[facility] Progress Note    Assessment/ Plan:   1. Acute nonoliguric kidney Injury: requiring CRRT for massive volume overload.  Etiology likely ATN after having been found down.  CVPs dropping so keeping even today.  I suspect creatinine prior to initiation of CRRT overestimated eGFR due to such low muscle mass and malnutrition.  He still has significant anasarca which willl take time to correct--> perhaps giving a day of net even will allow plasma refill to occur.  I anticipate needing another 48ish hours of CRRT to continue to correct volume status.  Given his severe cardiomyopathy, he is an extremely poor candidate for intermittent IHD.  If renal recovery does not occur, hospice would be appropriate.    2. 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  per AHF.  ON dobutamine and levophed.  Not a candidate for advanced therapies.  3. Mixed septic and cardiogenic shock: pressors as above, blood cultures 4/2 Citrobacter.  On ceftriaxone, stress dose steroids as well  4. Coagulopathy and thrombocytopenia: appears to have some component of DIC.  Getting plts 4/6--> only 10 today  5 Nutrition: severely malnourished with Albumin 1.5, hypoglycemic on D10 gtt, poor PO intake, s/p Coretrak.    6.  Dispo: poor prognosis, recommend palliative care consult.    Subjective:    Better urine output in the bag today.  CVP 6 per notes.  Was eating breakfast this AM but appeared sleepy so didn't eat much on his tray.  Still has significant anasarca.     Objective:   BP 101/74   Pulse 97   Temp 97.7 F (36.5 C)   Resp (!) 32   Ht 6' (1.829 m)   Wt 83.7 kg   SpO2 98%   BMI 25.03 kg/m   Intake/Output Summary (Last 24 hours) at 08/21/2018 0843 Last data filed at 08/21/2018 0800 Gross per 24 hour  Intake 2372.74 ml  Output 7079 ml  Net -4706.26 ml   Weight change: -5.4 kg  Physical Exam: [id number], intermittently somnolent  CVS:  tachycardic, + heave Resp: clear anteriorly Abd: distended, NAD Ext: ++ anasarca MSK: L foot appears dusky, petechiae. R foot looks better.  Per RN, dopplerable pulses bilaterally  Imaging: No results found.  Labs: BMET Recent Labs  Lab 08/17/18 1755 08/18/18 0207 08/18/18 1600 08/19/18 0413 08/19/18 1546 08/19/18 2047 08/20/18 0414 08/20/18 1806 08/21/18 0451  NA 139 140 138 136 137 138 135 137 137  K 4.2 4.2 4.1 4.2 4.1 3.9 4.5 3.9 3.5  CL 110 112* 108 106 105  --  104 104 102  CO2 19* '22 25 23 26  '$ --  '25 26 29  '$ GLUCOSE 146* 81 97 98 102*  --  96 129* 133*  BUN 48* 41* 31* 26* 24*  --  23* 23* 26*  CREATININE 1.99* 1.71* 1.39* 1.17 1.17  --  1.07 1.07 1.06  CALCIUM 7.8* 7.8* 7.7* 7.6* 7.8*  --  8.0* 8.4* 8.3*  PHOS 4.3  --  2.8 2.3* 4.4  --  2.9 2.7 2.4*   CBC Recent Labs  Lab 08/18/18 1751 08/19/18 0413 08/19/18 1115 08/19/18 2047 08/20/18 0414 08/21/18 0452  WBC 8.1 9.8  --   --  12.3* 10.0  NEUTROABS 7.6 8.0*  --   --  11.3* 9.1*  HGB 9.9* 9.7*  --  10.2* 10.3* 10.5*  HCT 29.9* 30.3*  --  30.0* 32.3* 31.9*  MCV 89.0 87.1  --   --  87.8 87.9  PLT 8* 8* 11*  --  8* 10*    Medications:    . chlorhexidine  15 mL Mouth Rinse BID  . Chlorhexidine Gluconate Cloth  6 each Topical Daily  . collagenase   Topical Daily  . feeding supplement (PRO-STAT SUGAR FREE 64)  60 mL Per Tube BID  . hydrocortisone sodium succinate  25 mg Intravenous Q6H  . mouth rinse  15 mL Mouth Rinse q12n4p  . [START ON 08/22/2018] pantoprazole sodium  40 mg Per Tube Daily  . sodium chloride flush  10-40 mL Intracatheter Q12H  . thiamine injection  100 mg Intravenous Daily      Madelon Lips MD [facility] pgr [phone] 08/21/2018, 8:43 AM

## 2018-08-21 NOTE — Consult Note (Signed)
Consultation Note Date: 08/21/2018   Patient Name: Kyle Conrad  DOB: 07-29-1965  MRN: [id number]  Age / Sex: 53 y.o., male  PCP: Kyle Dew, FNP Referring Physician: Chesley Mires, MD  Reason for Consultation: Establishing goals of care  HPI/Patient Profile: 53 y.o. 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 Now being treated for cardiogenic shock - on dobutamine and septic sock with citrobacter bacteremia. Patient also has AKI and has required CRRT. He has remained encephalopathic throughout admission. PMT consulted for [place].  Clinical Assessment and Goals of Care: I have reviewed medical records including EPIC notes, labs and imaging, received report from RN, and then spoke with patient's cousin, Kyle Conrad,  to discuss diagnosis prognosis, GOC, EOL wishes, disposition and options.  Initially, the only contact listed was patient's friend Kyle Conrad shares that they have been friends for years but she has not spoken with Kyle Conrad in several months. She was able to connect me with patient's cousin, Kyle Conrad.  Kyle Conrad shares that she lives in [place] and has not spoken with patient since November - however, she does consider them close and tells me that patient visited [place] often and she called him routinely. I clarified that she is next of kin - she tells me patient does not have spouse or children. She does share that patient has sisters however she does not know them and does not know if patient has relationship with them. She is going to reach out to an aunt and other cousins to determine if anyone knows contact information for patient's sisters.   I introduced Palliative Medicine as specialized medical care for people living with serious illness. It focuses on providing relief from the symptoms and stress of a serious illness. The goal  is to improve quality of life for both the patient and the family.  We discussed a brief life review of the patient. 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 She shares he loved to ride his bike. She describes him as big and jolly, with a great heart. She does share that the last time she saw him she noticed he had lost a lot of weight. She shares that after patient's father passed away he became somewhat withdrawn and did not have as much contact with family members.    We discussed his current illness and what it means in the larger context of his on-going co-morbidities.  Natural disease trajectory and expectations at EOL were discussed. She is aware of patient's heart failure and I detailed the severity of it. We also discussed patient's septic shock and AKI. Discussed that patient remains on CRRT and if he does not have renal recovery - he is not a candidate for intermittent dialysis.   I shared with Kyle Conrad the intensive care efforts being made to prolong Kyle Conrad life but also shared concerns that he may be nearing the end of his life. We discussed that Kyle Conrad is unable to make decisions for himself right now due to his altered mental status. I shared with her decisions we may be faced with in the future and need to discuss things like code status and potential hospice care.   Kyle Conrad expresses understanding and plans to reach out to other family members tonight. She is emotional throughout conversations. Emotional support provided. She expresses gratitude for information and conversation.   We discussed  plan to talk tomorrow and I will update her on clinical status and we can continue to discuss goals of care  - hopeful contact can be made with patient's sisters.   Questions and concerns were addressed. The family was encouraged to call with questions or concerns.   Primary Decision Maker NEXT OF KIN - for now only contact we have is cousin in [place] - she thinks patient has sisters and is attempting to find contact for them  [place]   - Decision maker at this point is cousin - number listed in demographics - Cousin is attempting to contact sisters? She is unsure if they have relationship with patient or if they can be contacted - Discussion was had about severity of patient's illness and poor prognosis - discussed potential decisions to be made and role of surrogate decision maker  Code Status/Advance Care Planning:  Full code   Symptom Management:   Per primary  Prognosis:   Unable to determine - potentially <2weeks if no renal recovery - at risk for acute decompensation  Discharge Planning: To Be Determined      Primary Diagnoses: Present on Admission: . Cardiogenic shock ([place])   I have reviewed the medical record, interviewed the patient and family, and examined the patient. The following aspects are pertinent.  Past Medical History:  Diagnosis Date  . CHF (congestive heart failure) ([place])   . Hypertension    Social History   Socioeconomic History  . Marital status: Single    Spouse name: Not on file  . Number of children: Not on file  . Years of education: Not on file  . Highest education level: Not on file  Occupational History  . Not on file  Social Needs  . Financial resource strain: Not on file  . Food insecurity:    Worry: Not on file    Inability: Not on file  . Transportation needs:    Medical: Not on file    Non-medical: Not on file  Tobacco Use  . Smoking status: Never Smoker  . Smokeless tobacco: Never Used  Substance and Sexual Activity  . Alcohol use: No  . Drug use: No  . Sexual activity: Not on file  Lifestyle  . Physical activity:    Days per week: Not on file    Minutes per session: Not on file  . Stress: Not on file  Relationships  . Social connections:    Talks on phone: Not on file    Gets together: Not on file    Attends religious service: Not  on file    Active member of club or organization: Not on file    Attends meetings of clubs or organizations: Not on file    Relationship status: Not on file  Other Topics Concern  . Not on file  Social History Narrative  . Not on file   Family History  Problem Relation Age of Onset  . Lung cancer Mother    Scheduled Meds: . chlorhexidine  15 mL Mouth Rinse BID  . Chlorhexidine Gluconate Cloth  6 each Topical Daily  . collagenase   Topical Daily  . feeding supplement (PRO-STAT SUGAR FREE 64)  60 mL Per Tube BID  . hydrocortisone sodium succinate  25 mg Intravenous Q6H  . mouth rinse  15 mL Mouth Rinse q12n4p  . [START ON 08/22/2018] pantoprazole sodium  40 mg Per Tube Daily  . polyethylene glycol  17 g Oral Daily  .  sodium chloride flush  10-40 mL Intracatheter Q12H  . thiamine  100 mg Per Tube Daily   Continuous Infusions: .  prismasol BGK 4/2.5 500 mL/hr at 08/21/18 1105  .  prismasol BGK 4/2.5 500 mL/hr at 08/21/18 0533  . sodium chloride Stopped (08/18/18 0747)  . sodium chloride Stopped (08/19/18 1159)  . cefTRIAXone (ROCEPHIN)  IV Stopped (08/20/18 2216)  . DOBUTamine 4 mcg/kg/min (08/21/18 1500)  . feeding supplement (VITAL 1.5 CAL) 50 mL/hr at 08/21/18 1500  . norepinephrine (LEVOPHED) Adult infusion Stopped (08/21/18 1104)  . prismasol BGK 4/2.5 2,000 mL/hr at 08/21/18 1317   PRN Meds:.Place/Maintain arterial line **AND** sodium chloride, lip balm, ondansetron (ZOFRAN) IV, senna, sodium chloride, sodium chloride flush Allergies  Allergen Reactions  . Lisinopril Swelling    REACTION: pt had a swelling episode.  His lips swelled  . Penicillins Hives    Has patient had a PCN reaction causing immediate rash, facial/tongue/throat swelling, SOB or lightheadedness with hypotension: Yes Has patient had a PCN reaction causing severe rash involving mucus membranes or skin necrosis: No Has patient had a PCN reaction that required hospitalization: No Has patient had a PCN  reaction occurring within the last 10 years: No If all of the above answers are "NO", then may proceed with Cephalosporin use.    Vital Signs: BP 103/74   Pulse 94   Temp (!) 97 F (36.1 C)   Resp 14   Ht 6' (1.829 m)   Wt 83.7 kg   SpO2 100%   BMI 25.03 kg/m  Pain Scale: 0-10   Pain Score: 0-No pain   SpO2: SpO2: 100 % O2 Device:SpO2: 100 % O2 Flow Rate: .O2 Flow Rate (L/min): 1 L/min  IO: Intake/output summary:   Intake/Output Summary (Last 24 hours) at 08/21/2018 1602 Last data filed at 08/21/2018 1530 Gross per 24 hour  Intake 2236.67 ml  Output 6370 ml  Net -4133.33 ml    LBM: Last BM Date: 08/19/18 Baseline Weight: Weight: 112 kg Most recent weight: Weight: 83.7 kg     Palliative Assessment/Data: PPS 20%   Time Total:90 minutes Greater than 50%  of this time was spent counseling and coordinating care related to the above assessment and plan.  Juel Burrow, DNP, AGNP-C Palliative Medicine Team [phone] Pager: [phone]

## 2018-08-21 NOTE — Progress Notes (Addendum)
Pharmacy Antibiotic Note  Kyle Conrad is a 53 y.o. male admitted on 08/16/2018 with sepsis.  Pharmacy consulted for cefepime dosing.  Patient afebrile overnight, wbc within normal limits. He is being treated for citrobacter bacteremia. Currently on day #4 of therapy. He remains on CRRT, no dose adjustments needed. Changed to ceftriaxone 4/5.   Plan: Ceftriaxone 2g q24>>4/12   Height: 6' (182.9 cm) Weight: 184 lb 8.4 oz (83.7 kg) IBW/kg (Calculated) : 77.6  Temp (24hrs), Avg:96.9 F (36.1 C), Min:96.6 F (35.9 C), Max:97.5 F (36.4 C)  Recent Labs  Lab 08/16/18 1333  08/16/18 1645 08/16/18 2227  08/18/18 0207  08/18/18 1751 08/19/18 0413 08/19/18 1546 08/20/18 0414 08/20/18 1806 08/21/18 0451 08/21/18 0452  WBC 4.3   < >  --   --    < > 6.3  --  8.1 9.8  --  12.3*  --   --  10.0  CREATININE 1.90*   < >  --   --    < > 1.71*   < >  --  1.17 1.17 1.07 1.07 1.06  --   LATICACIDVEN 3.1*  --  4.3* 4.9*  --   --   --   --   --   --   --   --   --   --    < > = values in this interval not displayed.    Estimated Creatinine Clearance: 87.4 mL/min (by C-G formula based on SCr of 1.06 mg/dL).    Allergies  Allergen Reactions  . Lisinopril Swelling    REACTION: pt had a swelling episode.  His lips swelled  . Penicillins Hives    Has patient had a PCN reaction causing immediate rash, facial/tongue/throat swelling, SOB or lightheadedness with hypotension: Yes Has patient had a PCN reaction causing severe rash involving mucus membranes or skin necrosis: No Has patient had a PCN reaction that required hospitalization: No Has patient had a PCN reaction occurring within the last 10 years: No If all of the above answers are "NO", then may proceed with Cephalosporin use.    Antimicrobials this admission: Vanc 4/2>> Levaquin 4/2 x1 Cefepime 4/3 >>   Microbiology results: 4/2 BCx: 2/4 citrobacter  4/2 MRSA PCR: negative  Erin Hearing PharmD., BCPS Clinical  Pharmacist 08/21/2018 7:33 AM

## 2018-08-22 ENCOUNTER — Inpatient Hospital Stay (HOSPITAL_COMMUNITY): Payer: Medicaid Other

## 2018-08-22 DIAGNOSIS — R7881 Bacteremia: Secondary | ICD-10-CM

## 2018-08-22 DIAGNOSIS — I509 Heart failure, unspecified: Secondary | ICD-10-CM

## 2018-08-22 DIAGNOSIS — N179 Acute kidney failure, unspecified: Secondary | ICD-10-CM

## 2018-08-22 LAB — CBC WITH DIFFERENTIAL/PLATELET
Abs Immature Granulocytes: 0.08 10*3/uL — ABNORMAL HIGH (ref 0.00–0.07)
Basophils Absolute: 0 10*3/uL (ref 0.0–0.1)
Basophils Relative: 0 %
Eosinophils Absolute: 0 10*3/uL (ref 0.0–0.5)
Eosinophils Relative: 0 %
HCT: 29.6 % — ABNORMAL LOW (ref 39.0–52.0)
Hemoglobin: 9.8 g/dL — ABNORMAL LOW (ref 13.0–17.0)
Immature Granulocytes: 1 %
Lymphocytes Relative: 5 %
Lymphs Abs: 0.4 10*3/uL — ABNORMAL LOW (ref 0.7–4.0)
MCH: 28.8 pg (ref 26.0–34.0)
MCHC: 33.1 g/dL (ref 30.0–36.0)
MCV: 87.1 fL (ref 80.0–100.0)
Monocytes Absolute: 0.4 10*3/uL (ref 0.1–1.0)
Monocytes Relative: 5 %
Neutro Abs: 8.5 10*3/uL — ABNORMAL HIGH (ref 1.7–7.7)
Neutrophils Relative %: 89 %
Platelets: 9 10*3/uL — CL (ref 150–400)
RBC: 3.4 MIL/uL — ABNORMAL LOW (ref 4.22–5.81)
RDW: 17.4 % — ABNORMAL HIGH (ref 11.5–15.5)
WBC: 9.4 10*3/uL (ref 4.0–10.5)
nRBC: 0 % (ref 0.0–0.2)

## 2018-08-22 LAB — COOXEMETRY PANEL
Carboxyhemoglobin: 2.3 % — ABNORMAL HIGH (ref 0.5–1.5)
Carboxyhemoglobin: 2.3 % — ABNORMAL HIGH (ref 0.5–1.5)
Methemoglobin: 1.2 % (ref 0.0–1.5)
Methemoglobin: 1.2 % (ref 0.0–1.5)
O2 Saturation: 92.6 %
O2 Saturation: 69.7 %
Total hemoglobin: 10.4 g/dL — ABNORMAL LOW (ref 12.0–16.0)
Total hemoglobin: 10.3 g/dL — ABNORMAL LOW (ref 12.0–16.0)

## 2018-08-22 LAB — RENAL FUNCTION PANEL
Albumin: 1.4 g/dL — ABNORMAL LOW (ref 3.5–5.0)
Anion gap: 5 (ref 5–15)
BUN: 35 mg/dL — ABNORMAL HIGH (ref 6–20)
CO2: 28 mmol/L (ref 22–32)
Calcium: 7.7 mg/dL — ABNORMAL LOW (ref 8.9–10.3)
Chloride: 103 mmol/L (ref 98–111)
Creatinine, Ser: 0.93 mg/dL (ref 0.61–1.24)
GFR calc Af Amer: >60 mL/min (ref >60)
GFR calc non Af Amer: >60 mL/min (ref >60)
Glucose, Bld: 124 mg/dL — ABNORMAL HIGH (ref 70–99)
Phosphorus: 1.7 mg/dL — ABNORMAL LOW (ref 2.5–4.6)
Potassium: 3.3 mmol/L — ABNORMAL LOW (ref 3.5–5.1)
Sodium: 136 mmol/L (ref 135–145)

## 2018-08-22 LAB — GLUCOSE, CAPILLARY
Glucose-Capillary: 121 mg/dL — ABNORMAL HIGH (ref 70–99)
Glucose-Capillary: 125 mg/dL — ABNORMAL HIGH (ref 70–99)

## 2018-08-22 LAB — MAGNESIUM: Magnesium: 2.2 mg/dL (ref 1.7–2.4)

## 2018-08-22 MED ORDER — POTASSIUM CHLORIDE 20 MEQ/15ML (10%) PO SOLN
40.0000 meq | Freq: Once | ORAL | Status: AC
  Administered 2018-08-22: 07:00:00 40 meq via ORAL
  Filled 2018-08-22: qty 30

## 2018-08-22 MED ORDER — ENSURE ENLIVE PO LIQD
237.0000 mL | Freq: Two times a day (BID) | ORAL | Status: DC
  Administered 2018-08-22 – 2018-08-28 (×12): 237 mL via ORAL

## 2018-08-22 MED ORDER — SODIUM PHOSPHATES 45 MMOLE/15ML IV SOLN
30.0000 mmol | Freq: Once | INTRAVENOUS | Status: AC
  Administered 2018-08-22: 08:00:00 30 mmol via INTRAVENOUS
  Filled 2018-08-22: qty 10

## 2018-08-22 MED ORDER — PRO-STAT SUGAR FREE PO LIQD
30.0000 mL | Freq: Every day | ORAL | Status: DC
  Administered 2018-08-23 – 2018-08-27 (×5): 30 mL
  Filled 2018-08-22 (×5): qty 30

## 2018-08-22 MED ORDER — DOBUTAMINE IN D5W 4-5 MG/ML-% IV SOLN
1.0000 ug/kg/min | INTRAVENOUS | Status: DC
  Administered 2018-08-22: 3 ug/kg/min via INTRAVENOUS

## 2018-08-22 MED ORDER — VITAL 1.5 CAL PO LIQD
1000.0000 mL | ORAL | Status: AC
  Administered 2018-08-22 – 2018-08-23 (×2): 1000 mL
  Filled 2018-08-22 (×3): qty 1000

## 2018-08-22 MED ORDER — SODIUM CHLORIDE 0.9% IV SOLUTION
Freq: Once | INTRAVENOUS | Status: AC
  Administered 2018-08-22: 10 mL/h via INTRAVENOUS

## 2018-08-22 MED ORDER — HEPARIN SODIUM (PORCINE) 1000 UNIT/ML DIALYSIS
1000.0000 [IU] | INTRAMUSCULAR | Status: DC | PRN
  Administered 2018-08-22: 3000 [IU] via INTRAVENOUS_CENTRAL
  Filled 2018-08-22: qty 3
  Filled 2018-08-22 (×2): qty 6

## 2018-08-22 NOTE — Progress Notes (Addendum)
Daily Progress Note   Patient Name: Kyle Conrad       Date: 08/22/2018 DOB: 09/11/1965  Age: 53 y.o. MRN#: [id number] Attending Physician: Chesley Mires, MD Primary Care Physician: Dorena Dew, FNP Admit Date: 08/16/2018  Reason for Consultation/Follow-up: Establishing goals of care  Subjective: RN reports patient remains confused - only oriented to self, alert. Taking off CRRT today. Levophed is off.   Length of Stay: 6  Current Medications: Scheduled Meds:  . chlorhexidine  15 mL Mouth Rinse BID  . Chlorhexidine Gluconate Cloth  6 each Topical Daily  . collagenase   Topical Daily  . feeding supplement (ENSURE ENLIVE)  237 mL Oral BID BM  . [START ON 08/23/2018] feeding supplement (PRO-STAT SUGAR FREE 64)  30 mL Per Tube Daily  . mouth rinse  15 mL Mouth Rinse q12n4p  . pantoprazole sodium  40 mg Per Tube Daily  . polyethylene glycol  17 g Oral Daily  . sodium chloride flush  10-40 mL Intracatheter Q12H  . thiamine  100 mg Per Tube Daily    Continuous Infusions: . sodium chloride Stopped (08/18/18 0747)  . sodium chloride Stopped (08/19/18 1159)  . cefTRIAXone (ROCEPHIN)  IV Stopped (08/21/18 2210)  . DOBUTamine 3 mcg/kg/min (08/22/18 1200)  . feeding supplement (VITAL 1.5 CAL) 1,000 mL (08/22/18 1308)  . sodium phosphate  Dextrose 5% IVPB 43 mL/hr at 08/22/18 1200    PRN Meds: Place/Maintain arterial line **AND** sodium chloride, heparin, lip balm, ondansetron (ZOFRAN) IV, senna, sodium chloride flush    Vital Signs: BP 120/85   Pulse (!) 108   Temp 98.1 F (36.7 C)   Resp (!) 21   Ht 6' (1.829 m)   Wt 84.7 kg   SpO2 100%   BMI 25.33 kg/m  SpO2: SpO2: 100 % O2 Device: O2 Device: Room Air O2 Flow Rate: O2 Flow Rate (L/min): 1 L/min  Intake/output summary:    Intake/Output Summary (Last 24 hours) at 08/22/2018 1343 Last data filed at 08/22/2018 1200 Gross per 24 hour  Intake 3778.7 ml  Output 4395 ml  Net -616.3 ml   LBM: Last BM Date: 08/22/18 Baseline Weight: Weight: 112 kg Most recent weight: Weight: 84.7 kg       Palliative Assessment/Data: PPS 20%   Flowsheet Rows     Most Recent Value  Intake Tab  Referral Department  Critical care  Unit at Time of Referral  ICU  Palliative Care Primary Diagnosis  Cardiac  Date Notified  08/21/18  Palliative Care Type  New Palliative care  Reason for referral  Clarify Goals of Care  Date of Admission  08/16/18  Date first seen by Palliative Care  08/21/18  # of days Palliative referral response time  0 Day(s)  # of days IP prior to Palliative referral  5  Clinical Assessment  Palliative Performance Scale Score  20%  Psychosocial & Spiritual Assessment  Palliative Care Outcomes  Patient/Family meeting held?  Yes  Who was at the meeting?  cousin  [place] regarding hospice, Provided advance care planning, Provided psychosocial or spiritual support      Patient Active Problem List   Diagnosis Date Noted  .  Goals of care, counseling/discussion   . Palliative care by specialist   . Cardiogenic shock ([place]) 08/16/2018  . Incarcerated left inguinal hernia 03/24/2018  . Hyponatremia 03/24/2018  . Other cirrhosis of liver ([place]) 03/24/2018  . Indirect inguinal hernia   . SBO (small bowel obstruction) ([place])   . Pressure injury of skin 03/18/2018  . Acute hypoxemic respiratory failure ([place]) 03/11/2018  . Chest pain 03/11/2018  . Hyperbilirubinemia 03/11/2018  . Acute exacerbation of CHF (congestive heart failure) ([place]) 03/10/2018  . HTN (hypertension) 08/10/2015  . Hypoalbuminemia 05/19/2015  . Anasarca   . Acute congestive heart failure ([place]) 04/22/2015  . Chronic anemia 04/22/2015  . Obstructive sleep apnea 12/15/2008  . FATIGUE / MALAISE 10/23/2008  . DYSPNEA  10/23/2008  . Chronic systolic CHF (congestive heart failure) ([place]) 09/25/2008  . Overweight 09/18/2008  . VENTRICULAR TACHYCARDIA 09/18/2008    Palliative Care Assessment & Plan   HPI: 53 y.o. 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 Now being treated for cardiogenic shock - on dobutamine - and septic shock with citrobacter bacteremia. Patient also has AKI and has required CRRT. He has remained encephalopathic throughout admission. PMT consulted for [place].  Assessment: Follow up conference call today with patient's aunt, Dontay Harm, and cousin, [name]. They live in [place].  We again discussed Jarick's clinical condition. Shared with them that trial off of CRRT is being tried today. Shared that he is off of levophed, but dobutamine continues. We discussed that he remains confused.   They are aware of patient's overall poor prognosis; but, remain hopeful for improvement in current situation - specifically hopeful for improvement in mental status so that he is able to discuss his own healthcare wishes. They feel uncomfortable making decisions for [place], but will if necessary.  We did discuss that if patient's kidneys do not improve - he would not be a candidate for intermittent dialysis. They are aware this would mean he would quickly near end of life. We discussed that hospice and comfort care would be appropriate in this case.   Did not discuss code status again today as they were clear yesterday they were uncomfortable making this decision.  We again discussed other family members that may be involved in the decision making process - they share that patient has half-sisters from his mother's side; however, they do not know them. They believe they live in [place] but do not know their names or contact information. They have contacted other family members and no one knows how to  reach them.   Recommendations/Plan:  Aunt and cousin have been established as Garment/textile technologist although they are somewhat uncomfortable in this role - reportedly patient has half-sisters in [place] but unable to locate them  Family hopeful for improvement in mental status so that patient can make decisions  Discussed poor prognosis, discussed hospice if no improvement in kidneys off of CRRT  PMT to continue to follow and support family and discuss with patient if he becomes able   Goals of Care and Additional Recommendations:  Limitations on Scope of Treatment: Full Scope Treatment  Code Status:  Full code  Prognosis:   Unable to determine - potentially < 2 weeks if no improvement in kidneys, at risk for acute decompensation  Discharge Planning:  To Be Determined  Care plan was discussed with RN, patient's aunt and cousin - numbers listed in demographics  Thank you for allowing the Palliative Medicine Team to assist in the care of this patient.   The above conversation was completed via telephone due to the visitor restrictions during the COVID-19 pandemic. Thorough chart review and discussion with necessary members of the care team was completed as part of assessment. All issues were discussed and addressed but no physical exam was performed.   Total Time 35 minutes Prolonged Time Billed  no       Greater than 50%  of this time was spent counseling and coordinating care related to the above assessment and plan.  Juel Burrow, DNP, [facility] Palliative Medicine Team Team Phone # [phone]  Pager [phone]

## 2018-08-22 NOTE — Progress Notes (Addendum)
Nutrition Follow-up  DOCUMENTATION CODES:   Non-severe (moderate) malnutrition in context of chronic illness  INTERVENTION:   - Ensure Enlive po BID, each supplement provides 350 kcal and 20 grams of protein  Adjust tube feeding via Cortrak: - Vital 1.5 @ 60 ml/hr (1440 ml/day) - Pro-stat 30 ml daily  Tube feeding regimen provides 2260 kcal, 112 grams of protein, and 1100 ml of H2O (100% of needs).  - Monitor for PO intake and adjust TF regimen as appropriate  NUTRITION DIAGNOSIS:   Moderate Malnutrition related to chronic illness (CHF) as evidenced by moderate fat depletion, moderate muscle depletion, severe muscle depletion.  New diagnosis after completion of NFPE  GOAL:   Patient will meet greater than or equal to 90% of their needs  Met via TF  MONITOR:   PO intake, Supplement acceptance, Labs, I & O's, Weight trends, Skin, TF tolerance  REASON FOR ASSESSMENT:   Consult Enteral/tube feeding initiation and management  ASSESSMENT:   53 year old male who presented to the ED on 4/2 with AMS and weakness. PMH of CHF, HTN. Pt admitted with decompensated biventricular heart failure and shock.  4/03 - CRRT initiated 4/04 - SLP evaluation with recommendations for regular diet and thin liquids 4/06- Cortrak placed and TF initiated  Palliative care team is involved and spoke with pt's cousin yesterday, 4/07. Conversations regarding [place] are ongoing.  Per Nephrology, stopping CRRT today. Pt is a poor candidate for long-term iHD.  Pt more alert today. Diet ordered. Will monitor for PO intake and ability to d/c TF and Cortrak.  Discussed pt with RN. Per RN, pt tolerating TF. Attempted to speak with pt at bedside. Pt confused, trying to "open the door" but pressing light button on and off repeatedly. Unable to provide much information.  Recommend continuing TF until pt is able to demonstrate ability to take in adequate PO. Will change TF regimen to better meet pt's needs  now that CRRT is off.  Current TF order: Vital 1.5 @ 50 ml/hr, Pro-stat 60 ml BID  Medications reviewed and include: Protonix, Miralax, thiamine, IV antibiotics, Dobutrex @ 6.3 ml/hr, Levophed @ 250 ml/hr, sodium phosphate 30 mmol once  Labs reviewed: potassium 3.3 (L), phosphorus 1.7 (L), hemoglobin 9.8 (L), platelets 9 (L) CBG's: 121, 127, 119, 103 x 24 hours  UOP: 3970 ml x 24 hours CRRT output: 616 ml x 24 hours I/O's: -15.3 L since admit  NUTRITION - FOCUSED PHYSICAL EXAM:    Most Recent Value  Orbital Region  Moderate depletion  Upper Arm Region  Moderate depletion  Thoracic and Lumbar Region  Moderate depletion  Buccal Region  Moderate depletion  Temple Region  Moderate depletion  Clavicle Bone Region  Severe depletion  Clavicle and Acromion Bone Region  Severe depletion  Scapular Bone Region  Severe depletion  Dorsal Hand  Moderate depletion  Patellar Region  Unable to assess [edema]  Anterior Thigh Region  Unable to assess  Posterior Calf Region  Unable to assess  Edema (RD Assessment)  Moderate [BLE]  Hair  Reviewed  Eyes  Reviewed  Mouth  Reviewed  Skin  Reviewed  Nails  Reviewed       Diet Order:   Diet Order            Diet 2 gram sodium Room service appropriate? Yes; Fluid consistency: Thin  Diet effective now              EDUCATION NEEDS:   No education needs have been identified  at this time  Skin:  Skin Assessment: Skin Integrity Issues: DTI: left buttocks, left thigh Other: non-pressure wound to scrotum, weeping from BLE   Last BM:  08/19/18 large type 6  Height:   Ht Readings from Last 1 Encounters:  08/16/18 6' (1.829 m)    Weight:   Wt Readings from Last 1 Encounters:  08/22/18 84.7 kg    Ideal Body Weight:  80.9 kg  BMI:  Body mass index is 25.33 kg/m.  Estimated Nutritional Needs:   Kcal:  2100-2300  Protein:  110-125 grams  Fluid:  per MD    Gaynell Face, MS, RD, LDN Inpatient Clinical  Dietitian Pager: [phone] Weekend/After Hours: [phone]

## 2018-08-22 NOTE — Progress Notes (Signed)
PT Cancellation Note  Patient Details Name: Kyle Conrad MRN: [id number] DOB: 1965-08-20   Cancelled Treatment:    Reason Eval/Treat Not Completed: Patient not medically ready. Pt on CRRT via line through R groin. Pt unable to participate in therapy at this time. Will follow-up for PT evaluation once pt is off CRRT or other activity orders are given.  Mabeline Caras, PT, DPT Acute Rehabilitation Services  Pager [phone] Office [place] 08/22/2018, 7:50 AM

## 2018-08-22 NOTE — Progress Notes (Addendum)
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  Being admitted 4/2 with acute mental status change, and decompensated biventricular heart failure and shock. Blood cultures growing Enterobacteriaceae. He has had continuing worsening thrombocytopenia w/o signs of bleeding.  History of present illness   53 year old male patient followed at the heart failure clinic with known ejection fraction of 10 to 15% with biventricular heart failure. Presents to the emergency room on 4/2 after being found by a neighbor on the floor at his house.  Reportedly he had just fallen but it is unclear exactly when this occurred as it was unwitnessed.  He was confused, reporting weakness to be generalized.  His weight had increased.  Last weight recorded in the clinic was 94 kg, he weighed 112 kg on presentation.  On EMS arrival his blood glucose was 47.  On arrival he was hypothermic with temperature of 86.9 Fahrenheit hypotensive with systolic blood pressure initially in the 80s but then down into the 50s he had diffuse 3-4+ pitting edema requiring 4 L to keep saturations greater than 90%.  Because of his hypotension, as well as altered sensorium the critical care team was asked to evaluate.  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  Severe mitral valve regurgitation. Diverticulosis Angioedema with ACE inhibitor Iron deficiency anemia Possible cirrhosis  Significant Hospital Events   4/2 admitted with hypoxia, delirium, and decompensated biventricular heart failure with circulatory shock 4/3 started on CRRT for volume removal 4/3 BCID Enterobacteriaceae species> citrobacter freundii  Consults:  HF 4/2 Nephro  4/3 Palliative 4/7  Procedures:  R CVC 4/2>> R femoral HD cath 4/3>>  Significant Diagnostic Tests:  CXR 4/3>> increasing bilateral pulmonary infiltrates RUS 4/3>> no hydronephrosis, large volume ascites CT head 4/4>> no acute findings CXR 4/8>> cardiomegaly, improvement in bil infiltrates  Micro Data:  Blood cultures x2/2 > BCID Enterobacteriaceae > citrobacter ferundii U strep 4/2>>> negative u legionella 4/2>>> negative  Antimicrobials:  Levofloxacin 4/2 Vancomycin 4/2>4/3 Cefepamine 4/3>>4/5 Ceftriaxone 4/5>>  Interim history/subjective:  Patient denies chest pain, shortness of breath; endorses weakness.   Objective   Blood pressure 119/85, pulse (!) 105, temperature (!) 97.2 F (36.2 C), resp. rate 20, height 6' (1.829 m), weight 84.7 kg, SpO2 99 %. CVP:  [4 mmHg-16 mmHg] 11 mmHg      Intake/Output Summary (Last 24 hours) at 08/22/2018 0640 Last data filed at 08/22/2018 0600 Gross per 24 hour  Intake 1772.08 ml  Output 4979 ml  Net -3206.92 ml   Filed Weights   08/20/18 0600 08/21/18 0500 08/22/18 0500  Weight: 89.1 kg 83.7 kg 84.7 kg    Examination: General: awake, oriented to self, year and state only. HENT: anicteric sclera, EOMI Lungs: No increased work of breathing, CTAB on anterior lung fields Cardiovascular: RRR, no M/R Abdomen: soft, active bowel sounds Extremities: 1+ pitting edema on posterior thighs Neuro: EOMI, PERRL, face symmetric, facial light touch sensation intact but subjectively decreased on right; normal hearing bilaterally; soft palate rises symmetrically; bilateral shoulder shrug; midline shoulder shrug; RUE 2/5 proximal, 3/5 distal with poor coordination; LUE 2/5 proximal, 3/5 distal; Bil LE can only wiggle toes. Sensation intact in bil LEs, but absent in bil UEs.  Resolved Hospital Problem list   Acute Hypoxic Respiratory failure in setting of decompensated HF w/ volume overload/pulm edema  Assessment & Plan:   Cardiogenic and  septic shock: HD stable while weaning norepinephrine. On cefepime for gram negative bacteremia to address sepsis. Diuresing and getting CRRT for volume removal and on dobutamine for biventricular heart failure, however not candidate for other advanced therapies. On stress dose steroids.  Acute decompensated Biventricular Heart failure: Echo reveals EF 20-25% with severe RV systolic failure. Dry weight 64kgs, weight 112kg on admission. Plan On dobutamine per HF Hold CRRT for now as appears euvolemic Daily weight, ins/outs  Citrobacter freundii bacteremia: - Ceftriaxone day 8  AKI, in setting of above, RUS w/o hydronephrosis. Likely ATN. On CRRT for volume removal (UF decreased), nephro expects another 48 hours or so and don't think he is an iHD candidate if renal recovery does not occur. Had significant increase in urine output which may reflect post ATN diuresis  Acute metabolic encephalopathy, focal neuro deficits  CT head negative. Slow improvement; likely due to ongoing medical problems and malnutrition. Once less encephalopathic, can consider MRI brain to eval for CVA or evidence of watershed infarct if workup will contribute to management; still overall poor prognosis so may not be of value. Plan Cont supportive care Hold sedating meds  Thrombocytopenia - 50s on admission now 8 Likely related to DIC 2/2 sepsis. S/p 2 units plts and 2 units FFP this admission - transfuse 1 u platelets today - will keep trending, and unless significant change or bleeding occurs, will hold further plt transfusions  Refractory Hypoglycemia- not of hypoglycemics at home, likely 2/2 sepsis and cardiac failure Plan Resolved with starting enteral feeding.  Appreciate palliative team help; attempting to establish next of kin for ongoing goals of care discussions.  Best practice:  Diet: TF, 2g Na diet if alert enough; when taking PO regularly, we can stop TF Pain/Anxiety/Delirium protocol (if  indicated): n/a VAP protocol (if indicated): n/a DVT prophylaxis: SCDs GI prophylaxis: protonix Glucose control: n/a Mobility: BR due to profound weakness Code Status: FULL Family Communication: pending Disposition: ICU  Labs   CBC: Recent Labs  Lab 08/18/18 1751 08/19/18 0413 08/19/18 1115 08/19/18 2047 08/20/18 0414 08/21/18 0452 08/21/18 1500 08/22/18 0322  WBC 8.1 9.8  --   --  12.3* 10.0 9.4 9.4  NEUTROABS 7.6 8.0*  --   --  11.3* 9.1*  --  8.5*  HGB 9.9* 9.7*  --  10.2* 10.3* 10.5* 9.9* 9.8*  HCT 29.9* 30.3*  --  30.0* 32.3* 31.9* 30.6* 29.6*  MCV 89.0 87.1  --   --  87.8 87.9 87.2 87.1  PLT 8* 8* 11*  --  8* 10* 11* 9*    Basic Metabolic Panel: Recent Labs  Lab 08/18/18 0207  08/19/18 0413 08/19/18 1546 08/19/18 2047 08/20/18 0414 08/20/18 1806 08/21/18 0451 08/22/18 0322  NA 140   < > 136 137 138 135 137 137 136  K 4.2   < > 4.2 4.1 3.9 4.5 3.9 3.5 3.3*  CL 112*   < > 106 105  --  104 104 102 103  CO2 22   < > 23 26  --  25 26 29 28   GLUCOSE 81   < > 98 102*  --  96 129* 133* 124*  BUN 41*   < > 26* 24*  --  23* 23* 26* 35*  CREATININE 1.71*   < > 1.17 1.17  --  1.07 1.07  1.06 0.93  CALCIUM 7.8*   < > 7.6* 7.8*  --  8.0* 8.4* 8.3* 7.7*  MG 2.2  --  2.0  --   --  2.2  --  2.2 2.2  PHOS  --    < > 2.3* 4.4  --  2.9 2.7 2.4* 1.7*   < > = values in this interval not displayed.   GFR: Estimated Creatinine Clearance: 99.7 mL/min (by C-G formula based on SCr of 0.93 mg/dL). Recent Labs  Lab 08/16/18 1333 08/16/18 1611 08/16/18 1645 08/16/18 2227  08/20/18 0414 08/21/18 0452 08/21/18 1500 08/22/18 0322  PROCALCITON  --  6.76  --   --   --   --   --   --   --   WBC 4.3 2.2*  --   --    < > 12.3* 10.0 9.4 9.4  LATICACIDVEN 3.1*  --  4.3* 4.9*  --   --   --   --   --    < > = values in this interval not displayed.    Liver Function Tests: Recent Labs  Lab 08/16/18 1333 08/16/18 1611  08/18/18 0207  08/19/18 1546 08/20/18 0414 08/20/18 1806  08/21/18 0451 08/22/18 0322  AST 48* 54*  --  37  --   --   --   --   --   --   ALT 23 21  --  20  --   --   --   --   --   --   ALKPHOS 114 99  --  64  --   --   --   --   --   --   BILITOT 3.7* 3.7*  --  4.7*  --   --   --   --   --   --   PROT 6.0* 5.2*  --  5.1*  --   --   --   --   --   --   ALBUMIN 1.9* 1.7*   < > 1.4*   < > 1.4* 1.5* 1.7* 1.5* 1.4*   < > = values in this interval not displayed.   Recent Labs  Lab 08/16/18 1611  AMYLASE 28   Recent Labs  Lab 08/16/18 1333 08/17/18 0923  AMMONIA 20 35    ABG    Component Value Date/Time   PHART 7.458 (H) 08/19/2018 2047   PCO2ART 35.9 08/19/2018 2047   PO2ART 76.0 (L) 08/19/2018 2047   HCO3 25.7 08/19/2018 2047   TCO2 27 08/19/2018 2047   ACIDBASEDEF 7.0 (H) 08/17/2018 0912   O2SAT 92.6 08/22/2018 0522     Coagulation Profile: Recent Labs  Lab 08/16/18 1611 08/17/18 0927 08/19/18 1115  INR 4.3* 3.8* 2.2*    Cardiac Enzymes: Recent Labs  Lab 08/16/18 1333  CKTOTAL 194  TROPONINI <0.03    HbA1C: Hgb A1c MFr Bld  Date/Time Value Ref Range Status  09/10/2008 12:40 PM  4.6 - 6.1 % Final   6.0 (NOTE) The ADA recommends the following therapeutic goal for glycemic control related to Hgb A1c measurement: Goal of therapy: <6.5 Hgb A1c  Reference: American Diabetes Association: Clinical Practice Recommendations 2010, Diabetes Care, 2010, 33: (Suppl  1).    CBG: Recent Labs  Lab 08/21/18 0734 08/21/18 1222 08/21/18 1632 08/21/18 1941 08/21/18 2306  GLUCAP 110* 95 103* 119* 127*    Past Medical History  He,  has a past medical history of CHF (congestive heart failure) (HCC) and  Hypertension.   Surgical History    Past Surgical History:  Procedure Laterality Date   CARDIAC CATHETERIZATION N/A 04/30/2015   Procedure: Right/Left Heart Cath and Coronary Angiography;  Surgeon: Larey Dresser, MD;  Location: [facility];  Service: Cardiovascular;  Laterality: N/A;   COLONOSCOPY WITH  PROPOFOL N/A 05/01/2015   Procedure: COLONOSCOPY WITH PROPOFOL;  Surgeon: Wilford Corner, MD;  Location: [facility] ENDOSCOPY;  Service: Endoscopy;  Laterality: N/A;   ESOPHAGOGASTRODUODENOSCOPY (EGD) WITH PROPOFOL N/A 05/01/2015   Procedure: ESOPHAGOGASTRODUODENOSCOPY (EGD) WITH PROPOFOL;  Surgeon: Wilford Corner, MD;  Location: [facility] ENDOSCOPY;  Service: Endoscopy;  Laterality: N/A;   INGUINAL HERNIA REPAIR Left 03/24/2018   Procedure: HERNIA REPAIR INGUINAL INCARCERATED;  Surgeon: Georganna Skeans, MD;  Location: [place];  Service: General;  Laterality: Left;   INSERTION OF MESH Left 03/24/2018   Procedure: INSERTION OF MESH;  Surgeon: Georganna Skeans, MD;  Location: [place];  Service: General;  Laterality: Left;   SHOULDER SURGERY       Social History   reports that he has never smoked. He has never used smokeless tobacco. He reports that he does not drink alcohol or use drugs.   Family History   His family history includes Lung cancer in his mother.   Allergies Allergies  Allergen Reactions   Lisinopril Swelling    REACTION: pt had a swelling episode.  His lips swelled   Penicillins Hives    Has patient had a PCN reaction causing immediate rash, facial/tongue/throat swelling, SOB or lightheadedness with hypotension: Yes Has patient had a PCN reaction causing severe rash involving mucus membranes or skin necrosis: No Has patient had a PCN reaction that required hospitalization: No Has patient had a PCN reaction occurring within the last 10 years: No If all of the above answers are "NO", then may proceed with Cephalosporin use.     Home Medications  Prior to Admission medications   Medication Sig Start Date End Date Taking? Authorizing Provider  metolazone (ZAROXOLYN) 2.5 MG tablet Take 1 tablet (2.5 mg total) by mouth daily. As directed by heart failure clinic 06/05/18 09/03/18 Yes Tillery, Satira Mccallum, PA-C  potassium chloride 20 MEQ TBCR Take 20 mEq by mouth 2 (two) times daily.  06/05/18  Yes Shirley Friar, PA-C  torsemide (DEMADEX) 20 MG tablet Take 2 tablets (40 mg total) by mouth 2 (two) times daily. 06/05/18  Yes Shirley Friar, PA-C  acetaminophen (TYLENOL) 500 MG tablet You can take 1000 mg every 8 hours as needed for pain.  Can alternate this with ibuprofen, or Tramadol.  Do not exceed 4000 mg of Tylenol(acetaminophen) per day it can harm your liver.  You can buy this over-the-counter at any drugstore. 03/28/18   Earnstine Regal, PA-C  docusate sodium (COLACE) 100 MG capsule Take 1 capsule (100 mg total) by mouth 2 (two) times daily. 03/20/18   Bonnielee Haff, MD  folic acid (FOLVITE) 1 MG tablet Take 1 tablet (1 mg total) by mouth daily. 03/20/18   Bonnielee Haff, MD  ibuprofen (ADVIL,MOTRIN) 200 MG tablet You can take 3 tablets every 6 hours as needed for pain.  You can alternate this with plain Tylenol or Tramadol.  You can buy this over-the-counter at any drugstore without a prescription. 03/28/18   Earnstine Regal, PA-C  losartan (COZAAR) 25 MG tablet Take 0.5 tablets (12.5 mg total) by mouth at bedtime. 04/10/18   Georgiana Shore, NP  magnesium oxide (MAG-OX) 400 MG tablet Take 1 tablet (400 mg total)  by mouth daily. 04/11/18   Georgiana Shore, NP  Multiple Vitamin (MULTIVITAMIN WITH MINERALS) TABS tablet Take 1 tablet by mouth daily. 03/20/18   Bonnielee Haff, MD  polyethylene glycol [facility] Floria Raveling) packet You can use this for constipation as instructed on the package directions.  You can buy this at any drugstore without a prescription. Patient not taking: Reported on 08/16/2018 03/28/18   Earnstine Regal, PA-C  traMADol (ULTRAM) 50 MG tablet Take 1 tablet (50 mg total) by mouth every 6 (six) hours as needed for moderate pain (Third line use if pain not relieved by PO Tylenol and Ibuprofen). 03/28/18   Earnstine Regal, PA-C     Alphonzo Grieve, MD IMTS - PGY3 Pager [phone]

## 2018-08-22 NOTE — Progress Notes (Signed)
Patient ID: Kyle Conrad, male   DOB: 1966/03/11, 53 y.o.   MRN: [id number]     Advanced Heart Failure Rounding Note  PCP-Cardiologist: No primary care provider on file.   Subjective:    More alert today, confused but can be re-oriented (initially thought he was in [facility]). Talking normally.   Remains on dobutamine 4 with co-ox 70%. On CVVH trying to run even, but had UOP 3970 last 24 hrs.   Platelets 9K again despite transfusion #2 on 4/6. No obvious bleeding. Hgb stable. INR and D dimer elevated though no schistocytes and fibrinogen normal.  Afebrile, now on ceftriaxone.  Initial blood cultures with Citrobacter freundii, ?skin source.  Reviewed CXR today, no obvious PNA.   Tube feeds ongoing, says he is hungry today.    Objective:   Weight Range: 84.7 kg Body mass index is 25.33 kg/m.   Vital Signs:   Temp:  [96.1 F (35.6 C)-98.1 F (36.7 C)] 97.3 F (36.3 C) (04/08 0900) Pulse Rate:  [85-196] 99 (04/08 0900) Resp:  [11-91] 14 (04/08 0900) BP: (91-122)/(64-90) 122/82 (04/08 0900) SpO2:  [90 %-100 %] 99 % (04/08 0900) Weight:  [84.7 kg] 84.7 kg (04/08 0500) Last BM Date: 08/22/18  Weight change: Filed Weights   08/20/18 0600 08/21/18 0500 08/22/18 0500  Weight: 89.1 kg 83.7 kg 84.7 kg    Intake/Output:   Intake/Output Summary (Last 24 hours) at 08/22/2018 0954 Last data filed at 08/22/2018 0900 Gross per 24 hour  Intake 2262.45 ml  Output 4418 ml  Net -2155.55 ml      Physical Exam    General: NAD Neck: JVP 7 cm, no thyromegaly or thyroid nodule.  Lungs: Decreased BS at bases.  CV: Lateral PMI.  Heart regular S1/S2, no S3/S4, 1/6 HSM LLSB/apex.  Trace lower extremity edema (improved).   Abdomen: Soft, nontender, no hepatosplenomegaly, no distention.  Skin: Intact without lesions or rashes.  Neurologic: Alert, confused but can be re-oriented.  Now speaking normally.   Psych: Normal affect. Extremities: No clubbing or cyanosis.  HEENT: Normal.     Telemetry   NSR 90s (personally reviewed)  Labs    CBC Recent Labs    08/21/18 0452 08/21/18 1500 08/22/18 0322  WBC 10.0 9.4 9.4  NEUTROABS 9.1*  --  8.5*  HGB 10.5* 9.9* 9.8*  HCT 31.9* 30.6* 29.6*  MCV 87.9 87.2 87.1  PLT 10* 11* 9*   Basic Metabolic Panel Recent Labs    08/21/18 0451 08/22/18 0322  NA 137 136  K 3.5 3.3*  CL 102 103  CO2 29 28  GLUCOSE 133* 124*  BUN 26* 35*  CREATININE 1.06 0.93  CALCIUM 8.3* 7.7*  MG 2.2 2.2  PHOS 2.4* 1.7*   Liver Function Tests Recent Labs    08/21/18 0451 08/22/18 0322  ALBUMIN 1.5* 1.4*   No results for input(s): LIPASE, AMYLASE in the last 72 hours. Cardiac Enzymes No results for input(s): CKTOTAL, CKMB, CKMBINDEX, TROPONINI in the last 72 hours.  BNP: BNP (last 3 results) Recent Labs    03/24/18 1435 06/05/18 1411 08/16/18 1611  BNP 2,471.9* 3,961.4* 2,433.3*    ProBNP (last 3 results) No results for input(s): PROBNP in the last 8760 hours.   D-Dimer Recent Labs    08/19/18 1115  DDIMER 3.71*   Hemoglobin A1C No results for input(s): HGBA1C in the last 72 hours. Fasting Lipid Panel No results for input(s): CHOL, HDL, LDLCALC, TRIG, CHOLHDL, LDLDIRECT in the last 72 hours. Thyroid Function  Tests No results for input(s): TSH, T4TOTAL, T3FREE, THYROIDAB in the last 72 hours.  Invalid input(s): FREET3  Other results:   Imaging    Dg Chest Port 1 View  Result Date: 08/22/2018 CLINICAL DATA:  53 year old male with a history of hypoxia kidney failure EXAM: PORTABLE CHEST 1 VIEW COMPARISON:  08/17/2018, 08/16/2018 FINDINGS: Cardiomediastinal silhouette unchanged in size and contour. Right subclavian central venous catheter unchanged. Interval placement of enteric tube, which projects over the mediastinum and terminates out of the field of view. Low lung volumes with linear opacities and mild interlobular septal thickening. No pneumothorax or new confluent airspace disease. IMPRESSION: Low lung  volumes with early edema versus chronic changes. Interval placement of enteric feeding tube which terminates out of the field of view. Unchanged right subclavian central line Electronically Signed   By: Corrie Mckusick D.O.   On: 08/22/2018 08:11     Medications:     Scheduled Medications: . chlorhexidine  15 mL Mouth Rinse BID  . Chlorhexidine Gluconate Cloth  6 each Topical Daily  . collagenase   Topical Daily  . feeding supplement (PRO-STAT SUGAR FREE 64)  60 mL Per Tube BID  . mouth rinse  15 mL Mouth Rinse q12n4p  . pantoprazole sodium  40 mg Per Tube Daily  . polyethylene glycol  17 g Oral Daily  . sodium chloride flush  10-40 mL Intracatheter Q12H  . thiamine  100 mg Per Tube Daily    Infusions: . sodium chloride Stopped (08/18/18 0747)  . sodium chloride Stopped (08/19/18 1159)  . cefTRIAXone (ROCEPHIN)  IV Stopped (08/21/18 2210)  . DOBUTamine    . feeding supplement (VITAL 1.5 CAL) 50 mL/hr at 08/22/18 0517  . sodium phosphate  Dextrose 5% IVPB 43 mL/hr at 08/22/18 0900    PRN Medications: Place/Maintain arterial line **AND** sodium chloride, heparin, lip balm, ondansetron (ZOFRAN) IV, senna, sodium chloride flush   Assessment/Plan   1. 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  He is markedly volume overloaded with suspected mixed septic/cardiogenic shock.  Echo 4/3 with severe biventricular failure, LVEF 20-25% small effusion, moderate to severe MR and severe TR.  Co-ox 70% today with stable MAP on dobutamine 4 (off norepinephrine). CVVH attempting to pull even now with CVP 5.  UOP picking up considerably. - Stop CVVH today, follow BUN/creatinine trend.  - No diuretics needed.   - Decrease dobutamine to 3.  - He is not going to be a candidate for advanced therapies.  LVAD likely not an option regardless with severe  RV dysfunction and has had very poor compliance with medical therapy.  2. ID: Suspect component of septic shock PCT 6.76.  CXR does not show PNA.  Citrobacter freundii in blood. ?Source from lower leg skin breakdown.  - Continue ceftriaxone.  3. AKI: Concern for ATN in setting of shock.  Was down at home for an undetermined length of time.  Initially required CVVH. CVP down to 5, vigorous UOP yesterday likely starting post-ATN diuresis.   - Stopping CVVH today. No diuretics.  - Make sure he does not develop central hypovolemia, encourage po intake.   4. Thrombocytopenia: Noted at admission, normal plts in the past.  Suspect related to sepsis (normal fibrinogen, no schistocytes in blood).  Transfused 1 unit plts 4/5 and again on 4/6.  Today, platelets still 9k. No overt bleeding.  - Discussed  with Dr Halford Chessman, will hold off on transfusion today, would give a unit if plts lower tomorrow.    5. Hypoglyemia: Resolved.   6. Neuro: Confusion/delirium, likely in setting of critical illness/infection. Head CT 4/4 with no acute changes. Passed swallow study.  Aphonia resolved.  Confused today but can be re-oriented.    7. DVT prophylaxis: Hold [place] heparin with low PLTs. SCDs.  8. FEN: Continue tube feeds via [place]. He is starting diet.  9. Deconditioning: out of bed, PT/OT.    Overall poor long-term prognosis.  He may recover from the current episode but he does not have good future options. Palliative care following.  Patient remains confused, no close family but palliative care has been talking with cousin.   CRITICAL CARE Performed by: Loralie Champagne  Total critical care time: 35 minutes  Critical care time was exclusive of separately billable procedures and treating other patients.  Critical care was necessary to treat or prevent imminent or life-threatening deterioration.  Critical care was time spent personally by me on the following activities: development of treatment plan with patient and/or  surrogate as well as nursing, discussions with consultants, evaluation of patient's response to treatment, examination of patient, obtaining history from patient or surrogate, ordering and performing treatments and interventions, ordering and review of laboratory studies, ordering and review of radiographic studies, pulse oximetry and re-evaluation of patient's condition.   Length of Stay: 6  Loralie Champagne, MD  08/22/2018, 9:54 AM  Advanced Heart Failure Team Pager [phone] (M-F; 7a - 4p)  Please contact [facility] for night-coverage after hours (4p -7a ) and weekends on [email or web address]

## 2018-08-22 NOTE — Progress Notes (Signed)
[facility] Progress Note    Assessment/ Plan:   1. Acute nonoliguric kidney Injury: requiring CRRT for massive volume overload.  Etiology likely ATN after having been found down.  CRRT 4/3-4/8.  Stop CRRT and await renal recovery given large increase in UOP.  Will not give diuretic today and will allow autodiuresis to occur.    Given his severe cardiomyopathy, he is an extremely poor candidate for intermittent IHD.  If renal recovery does not occur, hospice would be appropriate.  I appreciate palliative care involvement.  2. 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  per AHF.  ON dobutamine and levophed.  Not a candidate for advanced therapies.  3. Mixed septic and cardiogenic shock: pressors as above, blood cultures 4/2 Citrobacter.  On ceftriaxone, stress dose steroids as well  4. Coagulopathy and thrombocytopenia: appears to have some component of DIC.  Getting plts 4/6--> only 10 today  5 Nutrition: severely malnourished with Albumin 1.5, hypoglycemic on D10 gtt, poor PO intake, s/p Coretrak.    6.  Dispo: poor prognosis  Subjective:    CVP 7, had 4L UOP yesterday!!  Cr 0.9.  Still on pressors.       Objective:   BP 122/82   Pulse 99   Temp (!) 97.3 F (36.3 C)   Resp 14   Ht 6' (1.829 m)   Wt 84.7 kg   SpO2 99%   BMI 25.33 kg/m   Intake/Output Summary (Last 24 hours) at 08/22/2018 0916 Last data filed at 08/22/2018 0900 Gross per 24 hour  Intake 2262.45 ml  Output 4418 ml  Net -2155.55 ml   Weight change: 1 kg  Physical Exam: MIW:OEHOZYYQM, intermittently somnolent  CVS: tachycardic, + heave Resp: clear anteriorly Abd: distended, NAD Ext: ++ anasarca MSK: L foot appears dusky, petechiae. R foot looks better.  Per RN, dopplerable pulses bilaterally  Imaging: Dg Chest Port 1 View  Result Date: 08/22/2018 CLINICAL DATA:  53 year old male with a history of hypoxia kidney failure EXAM: PORTABLE CHEST 1 VIEW  COMPARISON:  08/17/2018, 08/16/2018 FINDINGS: Cardiomediastinal silhouette unchanged in size and contour. Right subclavian central venous catheter unchanged. Interval placement of enteric tube, which projects over the mediastinum and terminates out of the field of view. Low lung volumes with linear opacities and mild interlobular septal thickening. No pneumothorax or new confluent airspace disease. IMPRESSION: Low lung volumes with early edema versus chronic changes. Interval placement of enteric feeding tube which terminates out of the field of view. Unchanged right subclavian central line Electronically Signed   By: Corrie Mckusick D.O.   On: 08/22/2018 08:11    Labs: BMET Recent Labs  Lab 08/18/18 1600 08/19/18 0413 08/19/18 1546 08/19/18 2047 08/20/18 0414 08/20/18 1806 08/21/18 0451 08/22/18 0322  NA 138 136 137 138 135 137 137 136  K 4.1 4.2 4.1 3.9 4.5 3.9 3.5 3.3*  CL 108 106 105  --  104 104 102 103  CO2 25 23 26   --  25 26 29 28   GLUCOSE 97 98 102*  --  96 129* 133* 124*  BUN 31* 26* 24*  --  23* 23* 26* 35*  CREATININE 1.39* 1.17 1.17  --  1.07 1.07 1.06 0.93  CALCIUM 7.7* 7.6* 7.8*  --  8.0* 8.4* 8.3* 7.7*  PHOS 2.8 2.3* 4.4  --  2.9 2.7 2.4* 1.7*   CBC Recent Labs  Lab 08/19/18 0413  08/20/18 0414 08/21/18 0452 08/21/18 1500 08/22/18 0322  WBC  9.8  --  12.3* 10.0 9.4 9.4  NEUTROABS 8.0*  --  11.3* 9.1*  --  8.5*  HGB 9.7*   < > 10.3* 10.5* 9.9* 9.8*  HCT 30.3*   < > 32.3* 31.9* 30.6* 29.6*  MCV 87.1  --  87.8 87.9 87.2 87.1  PLT 8*   < > 8* 10* 11* 9*   < > = values in this interval not displayed.    Medications:    . chlorhexidine  15 mL Mouth Rinse BID  . Chlorhexidine Gluconate Cloth  6 each Topical Daily  . collagenase   Topical Daily  . feeding supplement (PRO-STAT SUGAR FREE 64)  60 mL Per Tube BID  . mouth rinse  15 mL Mouth Rinse q12n4p  . pantoprazole sodium  40 mg Per Tube Daily  . polyethylene glycol  17 g Oral Daily  . sodium chloride flush   10-40 mL Intracatheter Q12H  . thiamine  100 mg Per Tube Daily      Madelon Lips MD [facility] pgr [phone] 08/22/2018, 9:16 AM

## 2018-08-22 NOTE — Evaluation (Signed)
Physical Therapy Evaluation Patient Details Name: Kyle Conrad MRN: [id number] DOB: 11/17/1965 Today's Date: 08/22/2018   History of Present Illness  Pt is a 53 y.o. male admitted 08/16/18 with AMS after being found down by neighbor; treated for cardiogenic shock, septic shock with citrobacter bactermeia. Also with AKI; on CRRT 4/3-4/8. Has remained encephalopathic throughout admission. Head CT 4/4 with no acute changes. PMH includes biventricular HF, HTN.    Clinical Impression  Pt presents with an overall decrease in functional mobility secondary to above. Pt confused/disoriented and not forthcoming with details regarding PLOF; was indep and living alone per chart review from previous admission 5 months ago. Today, pt requiring maxA+2 for limited mobility. Pt with poor awareness, decreased attention and poor insight into deficits. Pt would benefit from continued acute PT services to maximize functional mobility and independence prior to d/c with SNF-level therapies.     Follow Up Recommendations SNF;Supervision/Assistance - 24 hour    Equipment Recommendations  (TBD)    Recommendations for Other Services       Precautions / Restrictions Precautions Precautions: Fall Restrictions Weight Bearing Restrictions: No      Mobility  Bed Mobility Overal bed mobility: Needs Assistance Bed Mobility: Supine to Sit;Sit to Supine     Supine to sit: Max assist;+2 for physical assistance;+2 for safety/equipment Sit to supine: Max assist;+2 for physical assistance;+2 for safety/equipment      Transfers                 General transfer comment: Deferred, not safe this session  Ambulation/Gait                Stairs            Wheelchair Mobility    Modified Rankin (Stroke Patients Only)       Balance Overall balance assessment: Needs assistance   Sitting balance-[name]: Fair Sitting balance - Comments: Close min guard for balance due to fatigue and poor  awareness       Standing balance comment: NT                             Pertinent Vitals/Pain Pain Assessment: Faces Faces Pain Scale: Hurts a little bit Pain Location: Generalized Pain Descriptors / Indicators: Grimacing Pain Intervention(s): Limited activity within patient's tolerance;Monitored during session    Home Living Family/patient expects to be discharged to:: Private residence Living Arrangements: Alone             Home Equipment: None Additional Comments: Information taken from recent admission (5 months ago) as pt not fully oriented and not forthcoming with answering questions    Prior Function Level of Independence: Independent         Comments: Per previous chart (admitted 5 months ago). Pt reports "I'm going to my bike" and talking about his motorcycle. Per chart, found down by neighbor     Hand Dominance        Extremity/Trunk Assessment   Upper Extremity Assessment Upper Extremity Assessment: Generalized weakness    Lower Extremity Assessment Lower Extremity Assessment: Difficult to assess due to impaired cognition;RLE deficits/detail;LLE deficits/detail RLE Deficits / Details: R knee flexion at least 3/5; pt not following commands then becoming agitated with further MMT eval. "Why is everyone always asking me to wiggle something" LLE Deficits / Details: L knee flexion at least 3/5; pt not following commands then becoming agitated with further MMT eval. "Why is everyone always asking  me to wiggle something"       Communication   Communication: No difficulties  Cognition Arousal/Alertness: Awake/alert Behavior During Therapy: Flat affect Overall Cognitive Status: No family/caregiver present to determine baseline cognitive functioning Area of Impairment: Orientation;Attention;Memory;Following commands;Safety/judgement;Awareness;Problem solving                               General Comments: Difficult to assess  cognition as pt not consistently answering questions. Able to state name and birthday, clearly confused throughout rest of session. "I need you to get be two things 1) Barnetta [place]... Katie, 2) a big towel for my head". Generally slow moving and eventually becoming agitated with therapists      General Comments      Exercises     Assessment/Plan    PT Assessment Patient needs continued PT services  PT Problem List Decreased strength;Decreased activity tolerance;Decreased balance;Decreased mobility;Decreased cognition;Decreased safety awareness;Cardiopulmonary status limiting activity;Decreased skin integrity       PT Treatment Interventions DME instruction;Gait training;Stair training;Functional mobility training;Therapeutic activities;Therapeutic exercise;Balance training;Patient/family education;Cognitive remediation    PT Goals (Current goals can be found in the Care Plan section)  Acute Rehab PT Goals Patient Stated Goal: "Get away from me" "I want you to find Katie" PT Goal Formulation: With patient Time For Goal Achievement: 09/05/18 Potential to Achieve Goals: Fair    Frequency Min 2X/week   Barriers to discharge Decreased caregiver support;Inaccessible home environment      Co-evaluation PT/OT/SLP Co-Evaluation/Treatment: Yes Reason for Co-Treatment: Complexity of the patient's impairments (multi-system involvement);Necessary to address cognition/behavior during functional activity;For patient/therapist safety;To address functional/ADL transfers PT goals addressed during session: Mobility/safety with mobility;Balance         AM-PAC PT "6 Clicks" Mobility  Outcome Measure Help needed turning from your back to your side while in a flat bed without using bedrails?: A Lot Help needed moving from lying on your back to sitting on the side of a flat bed without using bedrails?: A Lot Help needed moving to and from a bed to a chair (including a wheelchair)?: A Lot Help needed  standing up from a chair using your arms (e.g., wheelchair or bedside chair)?: A Lot Help needed to walk in hospital room?: Total Help needed climbing 3-5 steps with a railing? : Total 6 Click Score: 10    End of Session   Activity Tolerance: Patient tolerated treatment well;Patient limited by fatigue Patient left: in bed;with call bell/phone within reach;with bed alarm set;with nursing/sitter in room Nurse Communication: Mobility status PT Visit Diagnosis: Other abnormalities of gait and mobility (R26.89);Muscle weakness (generalized) (M62.81)    Time: 3500-9381 PT Time Calculation (min) (ACUTE ONLY): 24 min   Charges:   PT Evaluation $PT Eval Moderate Complexity: 1 Mod        Mabeline Caras, PT, DPT Acute Rehabilitation Services  Pager [phone] Office [phone]  Derry Lory 08/22/2018, 12:21 PM

## 2018-08-22 NOTE — Evaluation (Signed)
Occupational Therapy Evaluation Patient Details Name: Kyle Conrad MRN: [id number] DOB: 12-04-1965 Today's Date: 08/22/2018    History of Present Illness Pt is a 53 y.o. male admitted 08/16/18 with AMS after being found down by neighbor; treated for cardiogenic shock, septic shock with citrobacter bactermeia. Also with AKI; on CRRT 4/3-4/8. Has remained encephalopathic throughout admission. Head CT 4/4 with no acute changes. PMH includes biventricular HF, HTN.   Clinical Impression   Unsure of PLOF as Pt unreliable historian. Today was very guarded with therapists and confused throughout session, would not initiate grooming tasks, but could complete tasks after assist for starting the task. Pt overall max to total A for LB ADL, mod A for UB ADL. Wary of therapists. Pt will require skilled OT in the acute setting and afterwards at the SNF level. Next session focus on transfers and basic following directions - and try to build rapport and trust.    Follow Up Recommendations  SNF;Supervision/Assistance - 24 hour    Equipment Recommendations  Other (comment)(defer to next venue)    Recommendations for Other Services       Precautions / Restrictions Precautions Precautions: Fall Restrictions Weight Bearing Restrictions: No      Mobility Bed Mobility Overal bed mobility: Needs Assistance Bed Mobility: Supine to Sit;Sit to Supine     Supine to sit: Max assist;+2 for physical assistance;+2 for safety/equipment Sit to supine: Max assist;+2 for physical assistance;+2 for safety/equipment      Transfers                 General transfer comment: Deferred, not safe this session    Balance Overall balance assessment: Needs assistance   Sitting balance-[name]: Fair Sitting balance - Comments: Close min guard for balance due to fatigue and poor awareness       Standing balance comment: NT                           ADL either performed or assessed with  clinical judgement   ADL Overall ADL's : Needs assistance/impaired Eating/Feeding: NPO   Grooming: Wash/dry face;Moderate assistance;Sitting Grooming Details (indicate cue type and reason): would not initiate, after OT did his forehead and eyes he would participate Upper Body Bathing: Moderate assistance;Sitting   Lower Body Bathing: Maximal assistance;Sitting/lateral leans   Upper Body Dressing : Moderate assistance   Lower Body Dressing: Total assistance;Bed level   Toilet Transfer: (deferred)   Toileting- Clothing Manipulation and Hygiene: Total assistance;Bed level Toileting - Clothing Manipulation Details (indicate cue type and reason): current using condom cath     Functional mobility during ADLs: (declined) General ADL Comments: confusion, transitioned to irritation and mis-trust     Vision         Perception     Praxis      Pertinent Vitals/Pain Pain Assessment: Faces Faces Pain Scale: Hurts a little bit Pain Location: Generalized Pain Descriptors / Indicators: Grimacing Pain Intervention(s): Limited activity within patient's tolerance;Repositioned;Monitored during session     Hand Dominance Right   Extremity/Trunk Assessment Upper Extremity Assessment Upper Extremity Assessment: Generalized weakness(says that he has pain in ROM with hands)   Lower Extremity Assessment Lower Extremity Assessment: Defer to PT evaluation RLE Deficits / Details: R knee flexion at least 3/5; pt not following commands then becoming agitated with further MMT eval. "Why is everyone always asking me to wiggle something" LLE Deficits / Details: L knee flexion at least 3/5; pt not following commands  then becoming agitated with further MMT eval. "Why is everyone always asking me to wiggle something"   Cervical / Trunk Assessment Cervical / Trunk Assessment: Kyphotic   Communication Communication Communication: No difficulties   Cognition Arousal/Alertness: Awake/alert Behavior  During Therapy: Flat affect Overall Cognitive Status: No family/caregiver present to determine baseline cognitive functioning Area of Impairment: Orientation;Attention;Memory;Following commands;Safety/judgement;Awareness;Problem solving                 Orientation Level: Disoriented to;Place;Situation Current Attention Level: Sustained Memory: Decreased recall of precautions;Decreased short-term memory Following Commands: Follows one step commands with increased time;Follows one step commands inconsistently Safety/Judgement: Decreased awareness of safety;Decreased awareness of deficits Awareness: Intellectual Problem Solving: Slow processing;Difficulty sequencing;Decreased initiation;Requires verbal cues(wary of tactile cues) General Comments: Difficult to assess cognition as pt not consistently answering questions. Able to state name and birthday, clearly confused throughout rest of session. "I need you to get be two things 1) Kyle Conrad... Kyle Conrad, 2) a big towel for my head". Generally slow moving and eventually becoming agitated with therapists   General Comments       Exercises     Shoulder Instructions      Home Living Family/patient expects to be discharged to:: Private residence Living Arrangements: Alone                           Home Equipment: None   Additional Comments: Information taken from recent admission (5 months ago) as pt not fully oriented and not forthcoming with answering questions      Prior Functioning/Environment Level of Independence: Independent        Comments: Per previous chart (admitted 5 months ago). Pt reports "I'm going to my bike" and talking about his motorcycle. Per chart, found down by neighbor        OT Problem List: Decreased strength;Decreased range of motion;Decreased activity tolerance;Impaired balance (sitting and/or standing);Decreased cognition;Decreased safety awareness;Decreased knowledge of use of DME or  AE;Decreased knowledge of precautions;Cardiopulmonary status limiting activity      OT Treatment/Interventions: Self-care/ADL training;DME and/or AE instruction;Therapeutic activities;Patient/family education;Balance training    OT Goals(Current goals can be found in the care plan section) Acute Rehab OT Goals Patient Stated Goal: "Get away from me" "I want you to find Kyle Conrad" OT Goal Formulation: With patient Time For Goal Achievement: 09/05/18 Potential to Achieve Goals: Fair ADL Goals Pt Will Perform Grooming: with set-up;sitting Pt Will Perform Upper Body Dressing: with supervision;sitting Pt Will Perform Lower Body Dressing: with mod assist;sit to/from stand Pt Will Transfer to Toilet: with mod assist;ambulating Additional ADL Goal #1: Pt will perform bed mobility at min A prior to engaging in ADL  OT Frequency: Min 2X/week   Barriers to D/C: Decreased caregiver support  Pt lives alone       Co-evaluation PT/OT/SLP Co-Evaluation/Treatment: Yes Reason for Co-Treatment: Complexity of the patient's impairments (multi-system involvement);To address functional/ADL transfers;For patient/therapist safety;Necessary to address cognition/behavior during functional activity PT goals addressed during session: Mobility/safety with mobility;Balance;Strengthening/ROM OT goals addressed during session: ADL's and self-care;Strengthening/ROM      AM-PAC OT "6 Clicks" Daily Activity     Outcome Measure Help from another person eating meals?: Total(NPO) Help from another person taking care of personal grooming?: A Lot Help from another person toileting, which includes using toliet, bedpan, or urinal?: Total Help from another person bathing (including washing, rinsing, drying)?: A Lot Help from another person to put on and taking off regular upper body clothing?: A Lot Help  from another person to put on and taking off regular lower body clothing?: A Lot 6 Click Score: 10   End of Session Nurse  Communication: Mobility status;Other (comment)(agitation, asking for "Kyle Conrad?")  Activity Tolerance: Treatment limited secondary to agitation Patient left: in bed;with call bell/phone within reach;with bed alarm set;with SCD's reapplied  OT Visit Diagnosis: Unsteadiness on feet (R26.81);Other abnormalities of gait and mobility (R26.89);Muscle weakness (generalized) (M62.81);Other symptoms and signs involving cognitive function;Adult, failure to thrive (R62.7)                Time: 6484-7207 OT Time Calculation (min): 24 min Charges:  OT General Charges $OT Visit: 1 Visit OT Evaluation $OT Eval Moderate Complexity: [place] OTR/L Acute Rehabilitation Services Pager: [phone] Office: [place] 08/22/2018, 2:34 PM

## 2018-08-23 ENCOUNTER — Inpatient Hospital Stay (HOSPITAL_COMMUNITY): Payer: Medicaid Other

## 2018-08-23 DIAGNOSIS — I96 Gangrene, not elsewhere classified: Secondary | ICD-10-CM

## 2018-08-23 DIAGNOSIS — I739 Peripheral vascular disease, unspecified: Secondary | ICD-10-CM

## 2018-08-23 DIAGNOSIS — E440 Moderate protein-calorie malnutrition: Secondary | ICD-10-CM

## 2018-08-23 DIAGNOSIS — I509 Heart failure, unspecified: Secondary | ICD-10-CM

## 2018-08-23 DIAGNOSIS — L03115 Cellulitis of right lower limb: Secondary | ICD-10-CM

## 2018-08-23 DIAGNOSIS — L03116 Cellulitis of left lower limb: Secondary | ICD-10-CM

## 2018-08-23 LAB — RENAL FUNCTION PANEL
Albumin: 1.4 g/dL — ABNORMAL LOW (ref 3.5–5.0)
Anion gap: 11 (ref 5–15)
BUN: 56 mg/dL — ABNORMAL HIGH (ref 6–20)
CO2: 30 mmol/L (ref 22–32)
Calcium: 7.8 mg/dL — ABNORMAL LOW (ref 8.9–10.3)
Chloride: 99 mmol/L (ref 98–111)
Creatinine, Ser: 1.28 mg/dL — ABNORMAL HIGH (ref 0.61–1.24)
GFR calc Af Amer: >60 mL/min (ref >60)
GFR calc non Af Amer: >60 mL/min (ref >60)
Glucose, Bld: 120 mg/dL — ABNORMAL HIGH (ref 70–99)
Phosphorus: 2.8 mg/dL (ref 2.5–4.6)
Potassium: 2.7 mmol/L — CL (ref 3.5–5.1)
Sodium: 140 mmol/L (ref 135–145)

## 2018-08-23 LAB — BASIC METABOLIC PANEL
Anion gap: 7 (ref 5–15)
BUN: 63 mg/dL — ABNORMAL HIGH (ref 6–20)
CO2: 31 mmol/L (ref 22–32)
Calcium: 7.5 mg/dL — ABNORMAL LOW (ref 8.9–10.3)
Chloride: 104 mmol/L (ref 98–111)
Creatinine, Ser: 1.21 mg/dL (ref 0.61–1.24)
GFR calc Af Amer: >60 mL/min (ref >60)
GFR calc non Af Amer: >60 mL/min (ref >60)
Glucose, Bld: 121 mg/dL — ABNORMAL HIGH (ref 70–99)
Potassium: 2.9 mmol/L — ABNORMAL LOW (ref 3.5–5.1)
Sodium: 142 mmol/L (ref 135–145)

## 2018-08-23 LAB — PREPARE PLATELET PHERESIS: Unit division: 0

## 2018-08-23 LAB — GLUCOSE, CAPILLARY
Glucose-Capillary: 129 mg/dL — ABNORMAL HIGH (ref 70–99)
Glucose-Capillary: 132 mg/dL — ABNORMAL HIGH (ref 70–99)
Glucose-Capillary: 130 mg/dL — ABNORMAL HIGH (ref 70–99)
Glucose-Capillary: 119 mg/dL — ABNORMAL HIGH (ref 70–99)
Glucose-Capillary: 137 mg/dL — ABNORMAL HIGH (ref 70–99)

## 2018-08-23 LAB — CBC WITH DIFFERENTIAL/PLATELET
Abs Immature Granulocytes: 0.08 10*3/uL — ABNORMAL HIGH (ref 0.00–0.07)
Basophils Absolute: 0 10*3/uL (ref 0.0–0.1)
Basophils Relative: 0 %
Eosinophils Absolute: 0 10*3/uL (ref 0.0–0.5)
Eosinophils Relative: 0 %
HCT: 29.3 % — ABNORMAL LOW (ref 39.0–52.0)
Hemoglobin: 9.7 g/dL — ABNORMAL LOW (ref 13.0–17.0)
Immature Granulocytes: 1 %
Lymphocytes Relative: 8 %
Lymphs Abs: 0.7 10*3/uL (ref 0.7–4.0)
MCH: 28.9 pg (ref 26.0–34.0)
MCHC: 33.1 g/dL (ref 30.0–36.0)
MCV: 87.2 fL (ref 80.0–100.0)
Monocytes Absolute: 0.5 10*3/uL (ref 0.1–1.0)
Monocytes Relative: 5 %
Neutro Abs: 7.3 10*3/uL (ref 1.7–7.7)
Neutrophils Relative %: 86 %
Platelets: 19 10*3/uL — CL (ref 150–400)
RBC: 3.36 MIL/uL — ABNORMAL LOW (ref 4.22–5.81)
RDW: 17.5 % — ABNORMAL HIGH (ref 11.5–15.5)
WBC: 8.6 10*3/uL (ref 4.0–10.5)
nRBC: 0 % (ref 0.0–0.2)

## 2018-08-23 LAB — BPAM PLATELET PHERESIS
Blood Product Expiration Date: 202004092359
ISSUE DATE / TIME: 202004080804
Unit Type and Rh: 600

## 2018-08-23 LAB — COOXEMETRY PANEL
Carboxyhemoglobin: 2 % — ABNORMAL HIGH (ref 0.5–1.5)
Methemoglobin: 1.7 % — ABNORMAL HIGH (ref 0.0–1.5)
O2 Saturation: 93 %
Total hemoglobin: 10 g/dL — ABNORMAL LOW (ref 12.0–16.0)

## 2018-08-23 LAB — MAGNESIUM: Magnesium: 1.9 mg/dL (ref 1.7–2.4)

## 2018-08-23 MED ORDER — ACETAMINOPHEN 160 MG/5ML PO SOLN
650.0000 mg | ORAL | Status: DC | PRN
  Administered 2018-08-23 – 2018-09-04 (×3): 650 mg
  Filled 2018-08-23 (×3): qty 20.3

## 2018-08-23 MED ORDER — VANCOMYCIN HCL 10 G IV SOLR
2000.0000 mg | Freq: Once | INTRAVENOUS | Status: AC
  Administered 2018-08-23: 11:00:00 2000 mg via INTRAVENOUS
  Filled 2018-08-23: qty 2000

## 2018-08-23 MED ORDER — POTASSIUM CHLORIDE 20 MEQ/15ML (10%) PO SOLN
40.0000 meq | Freq: Once | ORAL | Status: AC
  Administered 2018-08-23: 40 meq via ORAL
  Filled 2018-08-23: qty 30

## 2018-08-23 MED ORDER — SODIUM CHLORIDE 0.9 % IV SOLN
1.0000 g | Freq: Two times a day (BID) | INTRAVENOUS | Status: DC
  Administered 2018-08-23 – 2018-08-25 (×6): 1 g via INTRAVENOUS
  Filled 2018-08-23 (×11): qty 1

## 2018-08-23 MED ORDER — VANCOMYCIN HCL IN DEXTROSE 750-5 MG/150ML-% IV SOLN
750.0000 mg | Freq: Three times a day (TID) | INTRAVENOUS | Status: DC
  Administered 2018-08-23 – 2018-08-26 (×8): 750 mg via INTRAVENOUS
  Filled 2018-08-23 (×11): qty 150

## 2018-08-23 MED ORDER — OSMOLITE 1.5 CAL PO LIQD
1000.0000 mL | ORAL | Status: DC
  Administered 2018-08-23 – 2018-08-26 (×3): 1000 mL
  Filled 2018-08-23 (×6): qty 1000

## 2018-08-23 NOTE — Consult Note (Signed)
Hospital Consult    Reason for Consult:  Wounds to LLE Referring Physician:  Critical Care MRN #:  [id number]  History of Present Illness: This is a 53 y.o. 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  Vascular surgery has been consulted today with concern for wounds on the left lower extremity.  Exact etiology for his fall and duration being found down is unclear per chart review.  Has been on CRRT with AKI.  He remains confused in the ICU but is on a dobutamine drip.  He cannot provide much history that is reliable.  Per the bedside nursing his wounds were present on admission.  Apparently had diffuse edema on admission and has been undergoing aggressive diuresis per chart review.  Past Medical History:  Diagnosis Date  . CHF (congestive heart failure) ([place])   . Hypertension     Past Surgical History:  Procedure Laterality Date  . CARDIAC CATHETERIZATION N/A 04/30/2015   Procedure: Right/Left Heart Cath and Coronary Angiography;  Surgeon: Larey Dresser, MD;  Location: [facility];  Service: Cardiovascular;  Laterality: N/A;  . COLONOSCOPY WITH PROPOFOL N/A 05/01/2015   Procedure: COLONOSCOPY WITH PROPOFOL;  Surgeon: Wilford Corner, MD;  Location: [facility] ENDOSCOPY;  Service: Endoscopy;  Laterality: N/A;  . ESOPHAGOGASTRODUODENOSCOPY (EGD) WITH PROPOFOL N/A 05/01/2015   Procedure: ESOPHAGOGASTRODUODENOSCOPY (EGD) WITH PROPOFOL;  Surgeon: Wilford Corner, MD;  Location: [facility] ENDOSCOPY;  Service: Endoscopy;  Laterality: N/A;  . INGUINAL HERNIA REPAIR Left 03/24/2018   Procedure: HERNIA REPAIR INGUINAL INCARCERATED;  Surgeon: Georganna Skeans, MD;  Location: [place];  Service: General;  Laterality: Left;  . INSERTION OF MESH Left 03/24/2018   Procedure: INSERTION OF MESH;  Surgeon: Georganna Skeans, MD;  Location: [place];  Service: General;  Laterality: Left;  . SHOULDER SURGERY      Allergies   Allergen Reactions  . Lisinopril Swelling    REACTION: pt had a swelling episode.  His lips swelled  . Penicillins Hives    Has patient had a PCN reaction causing immediate rash, facial/tongue/throat swelling, SOB or lightheadedness with hypotension: Yes Has patient had a PCN reaction causing severe rash involving mucus membranes or skin necrosis: No Has patient had a PCN reaction that required hospitalization: No Has patient had a PCN reaction occurring within the last 10 years: No If all of the above answers are "NO", then may proceed with Cephalosporin use.    Prior to Admission medications   Medication Sig Start Date End Date Taking? Authorizing Provider  metolazone (ZAROXOLYN) 2.5 MG tablet Take 1 tablet (2.5 mg total) by mouth daily. As directed by heart failure clinic 06/05/18 09/03/18 Yes Tillery, Satira Mccallum, PA-C  potassium chloride 20 MEQ TBCR Take 20 mEq by mouth 2 (two) times daily. 06/05/18  Yes Shirley Friar, PA-C  torsemide (DEMADEX) 20 MG tablet Take 2 tablets (40 mg total) by mouth 2 (two) times daily. 06/05/18  Yes Shirley Friar, PA-C  acetaminophen (TYLENOL) 500 MG tablet You can take 1000 mg every 8 hours as needed for pain.  Can alternate this with ibuprofen, or Tramadol.  Do not exceed 4000 mg of Tylenol(acetaminophen) per day it can harm your liver.  You can buy this over-the-counter at any drugstore. 03/28/18   Earnstine Regal, PA-C  docusate sodium (COLACE) 100 MG capsule Take 1 capsule (100 mg total) by mouth 2 (two) times daily. 03/20/18  Bonnielee Haff, MD  folic acid (FOLVITE) 1 MG tablet Take 1 tablet (1 mg total) by mouth daily. 03/20/18   Bonnielee Haff, MD  ibuprofen (ADVIL,MOTRIN) 200 MG tablet You can take 3 tablets every 6 hours as needed for pain.  You can alternate this with plain Tylenol or Tramadol.  You can buy this over-the-counter at any drugstore without a prescription. 03/28/18   Earnstine Regal, PA-C  losartan (COZAAR) 25 MG  tablet Take 0.5 tablets (12.5 mg total) by mouth at bedtime. 04/10/18   Georgiana Shore, NP  magnesium oxide (MAG-OX) 400 MG tablet Take 1 tablet (400 mg total) by mouth daily. 04/11/18   Georgiana Shore, NP  Multiple Vitamin (MULTIVITAMIN WITH MINERALS) TABS tablet Take 1 tablet by mouth daily. 03/20/18   Bonnielee Haff, MD  traMADol (ULTRAM) 50 MG tablet Take 1 tablet (50 mg total) by mouth every 6 (six) hours as needed for moderate pain (Third line use if pain not relieved by PO Tylenol and Ibuprofen). 03/28/18   Earnstine Regal, PA-C    Social History   Socioeconomic History  . Marital status: Single    Spouse name: Not on file  . Number of children: Not on file  . Years of education: Not on file  . Highest education level: Not on file  Occupational History  . Not on file  Social Needs  . Financial resource strain: Not on file  . Food insecurity:    Worry: Not on file    Inability: Not on file  . Transportation needs:    Medical: Not on file    Non-medical: Not on file  Tobacco Use  . Smoking status: Never Smoker  . Smokeless tobacco: Never Used  Substance and Sexual Activity  . Alcohol use: No  . Drug use: No  . Sexual activity: Not on file  Lifestyle  . Physical activity:    Days per week: Not on file    Minutes per session: Not on file  . Stress: Not on file  Relationships  . Social connections:    Talks on phone: Not on file    Gets together: Not on file    Attends religious service: Not on file    Active member of club or organization: Not on file    Attends meetings of clubs or organizations: Not on file    Relationship status: Not on file  . Intimate partner violence:    Fear of current or ex partner: Not on file    Emotionally abused: Not on file    Physically abused: Not on file    Forced sexual activity: Not on file  Other Topics Concern  . Not on file  Social History Narrative  . Not on file     Family History  Problem Relation Age of Onset   . Lung cancer Mother     ROS: [x]  Positive   [ ]  Negative   [ ]  All sytems reviewed and are negative  Cardiovascular: []  chest pain/pressure []  palpitations []  SOB lying flat []  DOE []  pain in legs while walking []  pain in legs at rest []  pain in legs at night []  non-healing ulcers []  hx of DVT [x]  swelling in legs  Pulmonary: []  productive cough []  asthma/wheezing []  home O2  Neurologic: []  weakness in []  arms []  legs []  numbness in []  arms []  legs []  hx of CVA []  mini stroke [] difficulty speaking or slurred speech []  temporary loss of vision in one eye []   dizziness  Hematologic: []  hx of cancer []  bleeding problems []  problems with blood clotting easily  Endocrine:   []  diabetes []  thyroid disease  GI []  vomiting blood []  blood in stool  GU: []  CKD/renal failure []  HD--[]  M/W/F or []  T/T/S []  burning with urination []  blood in urine  Psychiatric: []  anxiety []  depression  Musculoskeletal: []  arthritis []  joint pain  Integumentary: []  rashes []  ulcers  Constitutional: []  fever []  chills   Physical Examination  Vitals:   08/23/18 1000 08/23/18 1015  BP: 105/75 115/70  Pulse: (!) 104 (!) 105  Resp: 17 (!) 22  Temp:    SpO2: 96% 100%   Body mass index is 25.3 kg/m.  General:  Sitting up in bed in ICU Gait: Not observed HENT: [place] Pulmonary: Course breath sounds bilaterally Cardiac: Tachy, no peripheral edema now Skin: See below Vascular Exam/Pulses:  Right Left  Radial    Ulnar    Femoral 2+ (normal) 2+ (normal)  Popliteal    DP 2+ (normal) 2+ (normal)  PT 2+ (normal) 2+ (normal)   Extremities: without ischemic changes, without Gangrene , without cellulitis;superficial skin breakdown with open wounds left lower extremity as pictured below Musculoskeletal: no muscle wasting or atrophy  Neurologic: A&O X 3; Appropriate Affect ; SENSATION: normal; MOTOR FUNCTION:  moving all extremities equally. Speech is fluent/normal; able to  wiggle toes bilaterally and can feel feet with sensation intact.      CBC    Component Value Date/Time   WBC 8.6 08/23/2018 0448   RBC 3.36 (L) 08/23/2018 0448   HGB 9.7 (L) 08/23/2018 0448   HCT 29.3 (L) 08/23/2018 0448   PLT 19 (LL) 08/23/2018 0448   MCV 87.2 08/23/2018 0448   MCH 28.9 08/23/2018 0448   MCHC 33.1 08/23/2018 0448   RDW 17.5 (H) 08/23/2018 0448   LYMPHSABS 0.7 08/23/2018 0448   MONOABS 0.5 08/23/2018 0448   EOSABS 0.0 08/23/2018 0448   BASOSABS 0.0 08/23/2018 0448    BMET    Component Value Date/Time   NA 140 08/23/2018 0448   K 2.7 (LL) 08/23/2018 0448   CL 99 08/23/2018 0448   CO2 30 08/23/2018 0448   GLUCOSE 120 (H) 08/23/2018 0448   BUN 56 (H) 08/23/2018 0448   CREATININE 1.28 (H) 08/23/2018 0448   CALCIUM 7.8 (L) 08/23/2018 0448   GFRNONAA >60 08/23/2018 0448   GFRAA >60 08/23/2018 0448    COAGS: Lab Results  Component Value Date   INR 2.2 (H) 08/19/2018   INR 3.8 (H) 08/17/2018   INR 4.3 (HH) 08/16/2018     Non-Invasive Vascular Imaging:    No evidence of significant LE disease.   ASSESSMENT/PLAN: This is a 53 y.o. male that was admitted to the ICU for acute on chronic heart failure with likely end-stage biventricular failure after being found down at home.  His cardiac cardiogenic shock has been managed aggressively in ICU with diuresis and he remains on dobutamine.  Vascular surgery was called for wounds on the left lower extremity.  After review of noninvasive imaging he has no evidence of significant lower extremity arterial disease.  On exam he has bounding dorsalis pedis and posterior tibial pulses bilaterally.  Both of his feet are motor and sensory intact.  No further work-up or indication for intervention from our standpoint.  We will sign off.  He will need aggressive wound care to his left lower extremity and suspect a lot of this is breakdown from volume overload on  admission.   Marty Heck, MD [facility] Office: [phone] Pager: [phone]

## 2018-08-23 NOTE — Progress Notes (Addendum)
Patient ID: Kyle Conrad, male   DOB: Sep 06, 1965, 53 y.o.   MRN: [id number]    Advanced Heart Failure Rounding Note  PCP-Cardiologist: No primary care provider on file.   Subjective:    Alert but confused. Tm 100.7, WBCs normal.  He remains on ceftriaxone, initial blood cultures with Citrobacter freundii, ?skin source.   Remains on dobutamine 3 this morning.  Off CVVH, good UOP again -3670 cc.  Weight stable.   Platelets higher at 19K.   Tube feeds ongoing, also eating.     Objective:   Weight Range: 84.6 kg Body mass index is 25.3 kg/m.   Vital Signs:   Temp:  [96.8 F (36 C)-100.9 F (38.3 C)] 100.9 F (38.3 C) (04/09 0645) Pulse Rate:  [85-127] 123 (04/09 0645) Resp:  [13-46] 24 (04/09 0645) BP: (92-144)/(60-99) 118/71 (04/09 0645) SpO2:  [90 %-100 %] 94 % (04/09 0645) Weight:  [84.6 kg] 84.6 kg (04/09 0449) Last BM Date: 08/23/18  Weight change: Filed Weights   08/21/18 0500 08/22/18 0500 08/23/18 0449  Weight: 83.7 kg 84.7 kg 84.6 kg    Intake/Output:   Intake/Output Summary (Last 24 hours) at 08/23/2018 0736 Last data filed at 08/23/2018 0600 Gross per 24 hour  Intake 4529.45 ml  Output 3844 ml  Net 685.45 ml      Physical Exam    General: NAD Neck: No JVD, no thyromegaly or thyroid nodule.  Lungs: Clear to auscultation bilaterally with normal respiratory effort. CV: Lateral PMI. Heart tachy, regular S1/S2, no S3/S4, 2/6 HSM LLSB/apex.  No peripheral edema.   Abdomen: Soft, nontender, no hepatosplenomegaly, no distention.  Skin: Intact without lesions or rashes.  Neurologic: Alert but confused, oriented to person.   Psych: Normal affect. Extremities: Concern for skin sloughing on feet, lower legs dusky. Changes especially marked on left.  HEENT: Normal.    Telemetry   Sinus tachy 110s (personally reviewed)  Labs    CBC Recent Labs    08/22/18 0322 08/23/18 0448  WBC 9.4 8.6  NEUTROABS 8.5* 7.3  HGB 9.8* 9.7*  HCT 29.6* 29.3*  MCV  87.1 87.2  PLT 9* 19*   Basic Metabolic Panel Recent Labs    08/22/18 0322 08/23/18 0448  NA 136 140  K 3.3* 2.7*  CL 103 99  CO2 28 30  GLUCOSE 124* 120*  BUN 35* 56*  CREATININE 0.93 1.28*  CALCIUM 7.7* 7.8*  MG 2.2 1.9  PHOS 1.7* 2.8   Liver Function Tests Recent Labs    08/22/18 0322 08/23/18 0448  ALBUMIN 1.4* 1.4*   No results for input(s): LIPASE, AMYLASE in the last 72 hours. Cardiac Enzymes No results for input(s): CKTOTAL, CKMB, CKMBINDEX, TROPONINI in the last 72 hours.  BNP: BNP (last 3 results) Recent Labs    03/24/18 1435 06/05/18 1411 08/16/18 1611  BNP 2,471.9* 3,961.4* 2,433.3*    ProBNP (last 3 results) No results for input(s): PROBNP in the last 8760 hours.   D-Dimer No results for input(s): DDIMER in the last 72 hours. Hemoglobin A1C No results for input(s): HGBA1C in the last 72 hours. Fasting Lipid Panel No results for input(s): CHOL, HDL, LDLCALC, TRIG, CHOLHDL, LDLDIRECT in the last 72 hours. Thyroid Function Tests No results for input(s): TSH, T4TOTAL, T3FREE, THYROIDAB in the last 72 hours.  Invalid input(s): FREET3  Other results:   Imaging    No results found.   Medications:     Scheduled Medications: . chlorhexidine  15 mL Mouth Rinse BID  .  Chlorhexidine Gluconate Cloth  6 each Topical Daily  . collagenase   Topical Daily  . feeding supplement (ENSURE ENLIVE)  237 mL Oral BID BM  . feeding supplement (PRO-STAT SUGAR FREE 64)  30 mL Per Tube Daily  . mouth rinse  15 mL Mouth Rinse q12n4p  . pantoprazole sodium  40 mg Per Tube Daily  . polyethylene glycol  17 g Oral Daily  . potassium chloride  40 mEq Oral Once  . sodium chloride flush  10-40 mL Intracatheter Q12H  . thiamine  100 mg Per Tube Daily    Infusions: . sodium chloride Stopped (08/18/18 0747)  . sodium chloride Stopped (08/19/18 1159)  . cefTRIAXone (ROCEPHIN)  IV 2 g (08/22/18 2114)  . DOBUTamine 3 mcg/kg/min (08/23/18 0600)  . feeding  supplement (VITAL 1.5 CAL) 1,000 mL (08/23/18 0700)    PRN Medications: Place/Maintain arterial line **AND** sodium chloride, acetaminophen (TYLENOL) oral liquid 160 mg/5 mL, heparin, lip balm, ondansetron (ZOFRAN) IV, senna, sodium chloride flush   Assessment/Plan   1. 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  He is markedly volume overloaded with suspected mixed septic/cardiogenic shock.  Echo 4/3 with severe biventricular failure, LVEF 20-25% small effusion, moderate to severe MR and severe TR.  He continues on dobutamine 3, CVP 5-6 today.  Ongoing good UOP.  BUN/creatinine rising off CVVH. - No diuretics needed today.   - Decrease dobutamine to 2.  - He is not going to be a candidate for advanced therapies.  LVAD likely not an option regardless with severe RV dysfunction and has had very poor compliance with medical therapy.  2. ID: Suspect component of septic shock PCT 6.76.  Citrobacter freundii in blood initially. ?Source from lower leg/foot skin breakdown. Now febrile this morning and more tachycardic.  - Continue ceftriaxone.  - Blood and urine cultures.  Will repeat CXR.  3. AKI: Concern for ATN in setting of shock.  Was down at home for an undetermined length of time.  Initially required CVVH, now off. CVP down to 5-6 today, vigorous UOP yesterday likely starting post-ATN diuresis.  BUN/creatinine starting to rise off CVVH.  - No diuretics today.  - Make sure he does not develop central hypovolemia, encourage po intake.   4. Thrombocytopenia: Noted at admission, normal plts in the past.  Suspect related to sepsis (normal fibrinogen, no schistocytes in blood).  Transfused 1 unit plts 4/5 and again on 4/6.  Today, platelets up to 19K, hopefully will continue to improve.  No overt bleeding.  5. Hypoglyemia: Resolved.   6. Neuro:  Confusion/delirium, likely in setting of critical illness/infection. Head CT 4/4 with no acute changes. Passed swallow study.  Aphonia resolved.  Confused today but can be re-oriented, fever likely makes this worse.    7. DVT prophylaxis: Hold [place] heparin with low PLTs. SCDs.  8. Vascular: Concern for lower leg poor circulation, especially left leg.   - Order arterial dopplers.  - Think vascular should evaluate.  - Wound care consult.  9. FEN: Continue tube feeds via [place]. He is eating as well.   10. Deconditioning: Severe.  Out of bed, PT/OT.    Overall poor long-term prognosis.  He may recover from the current episode but he does not have good future options. Palliative care following.  Patient remains confused, no close family but palliative care has been talking with cousin.  CRITICAL CARE Performed by: Loralie Champagne  Total critical care time: 35 minutes  Critical care time was exclusive of separately billable procedures and treating other patients.  Critical care was necessary to treat or prevent imminent or life-threatening deterioration.  Critical care was time spent personally by me on the following activities: development of treatment plan with patient and/or surrogate as well as nursing, discussions with consultants, evaluation of patient's response to treatment, examination of patient, obtaining history from patient or surrogate, ordering and performing treatments and interventions, ordering and review of laboratory studies, ordering and review of radiographic studies, pulse oximetry and re-evaluation of patient's condition.   Length of Stay: 7  Loralie Champagne, MD  08/23/2018, 7:36 AM  Advanced Heart Failure Team Pager [phone] (M-F; [place])  Please contact [facility] for night-coverage after hours (4p -7a ) and weekends on [email or web address]

## 2018-08-23 NOTE — Plan of Care (Signed)
  Problem: Education: Goal: Knowledge of General Education information will improve Description: Including pain rating scale, medication(s)/side effects and non-pharmacologic comfort measures Outcome: Not Progressing   

## 2018-08-23 NOTE — Progress Notes (Signed)
CRITICAL VALUE ALERT  Critical Value:  Potassium 2.7  Date & Time Notied: 08/23/2018 0541  Provider Notified: Maretta Bees, RN notified.   Orders Received/Actions taken: Waiting for orders.  Also notified that core temperature is trending upward.

## 2018-08-23 NOTE — Consult Note (Addendum)
WOC consult requested for foot and leg wounds.  This was already performed on 4/6; refer to previous progress notes for assessment, measurements, and topical treatment orders have been provided for staff nurses to perform.Please consult Vascular team for further plan of care for toes if they become increasingly necrotic; topical treatment will be minimally effective.  Please re-consult if further assistance is needed.  Thank-you,  Julien Girt MSN, [place], [employer], [place], [place]

## 2018-08-23 NOTE — Progress Notes (Signed)
[facility] Progress Note    Assessment/ Plan:   1. Acute kidney Injury: initially oliguric.  Required CRRT for massive volume overload.  Etiology likely ATN after having been found down.  CRRT 4/3-4/8.  Await renal recovery given large increase in UOP.  Will not give diuretic  and will allow autodiuresis to occur.    Given his severe cardiomyopathy, he is an extremely poor candidate for intermittent IHD.  If renal recovery does not occur, hospice would be appropriate.  I appreciate palliative care involvement.  2. 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  per AHF.  ON dobutamine and levophed.  Not a candidate for advanced therapies.  3. Mixed septic and cardiogenic shock: pressors as above, blood cultures 4/2 Citrobacter.  On ceftriaxone, stress dose steroids as well.  His left leg has multiple foul-smelling wounds and his L foot appears to have evolving necrotic wounds.  Recommend vascular c/s- discussed with Dr. Aundra Dubin.  4. Coagulopathy and thrombocytopenia: appears to have some component of DIC.  Required multiple platelet transfusions- up to 19 now.  5 Nutrition: severely malnourished with Albumin 1.5, hypoglycemic on D10 gtt, poor PO intake, s/p [place].    6.  Hypokalemia: repletion prn  7.  Dispo: poor prognosis  Subjective:    Still with robust urine output.  Delirious and febrile this AM.        Objective:   BP 118/71   Pulse (!) 123   Temp (!) 100.9 F (38.3 C)   Resp (!) 24   Ht 6' (1.829 m)   Wt 84.6 kg   SpO2 94%   BMI 25.30 kg/m   Intake/Output Summary (Last 24 hours) at 08/23/2018 0759 Last data filed at 08/23/2018 0600 Gross per 24 hour  Intake 4529.45 ml  Output 3844 ml  Net 685.45 ml   Weight change: -0.1 kg  Physical Exam: QIO:[id number], intermittently somnolent  CVS: tachycardic, + heave Resp: clear anteriorly Abd: distended, NAD Ext: ++ anasarca MSK: L foot appears dusky, petechiae. R  foot looks better.  + pulses.  Multiple foul-smelling wounds all the way up the dorsal thigh  Imaging: Dg Chest Port 1 View  Result Date: 08/22/2018 CLINICAL DATA:  53 year old male with a history of hypoxia kidney failure EXAM: PORTABLE CHEST 1 VIEW COMPARISON:  08/17/2018, 08/16/2018 FINDINGS: Cardiomediastinal silhouette unchanged in size and contour. Right subclavian central venous catheter unchanged. Interval placement of enteric tube, which projects over the mediastinum and terminates out of the field of view. Low lung volumes with linear opacities and mild interlobular septal thickening. No pneumothorax or new confluent airspace disease. IMPRESSION: Low lung volumes with early edema versus chronic changes. Interval placement of enteric feeding tube which terminates out of the field of view. Unchanged right subclavian central line Electronically Signed   By: Corrie Mckusick D.O.   On: 08/22/2018 08:11    Labs: BMET Recent Labs  Lab 08/19/18 0413 08/19/18 1546 08/19/18 2047 08/20/18 0414 08/20/18 1806 08/21/18 0451 08/22/18 0322 08/23/18 0448  NA 136 137 138 135 137 137 136 140  K 4.2 4.1 3.9 4.5 3.9 3.5 3.3* 2.7*  CL 106 105  --  104 104 102 103 99  CO2 23 26  --  25 26 29 28 30   GLUCOSE 98 102*  --  96 129* 133* 124* 120*  BUN 26* 24*  --  23* 23* 26* 35* 56*  CREATININE 1.17 1.17  --  1.07 1.07 1.06 0.93 1.28*  CALCIUM  7.6* 7.8*  --  8.0* 8.4* 8.3* 7.7* 7.8*  PHOS 2.3* 4.4  --  2.9 2.7 2.4* 1.7* 2.8   CBC Recent Labs  Lab 08/20/18 0414 08/21/18 0452 08/21/18 1500 08/22/18 0322 08/23/18 0448  WBC 12.3* 10.0 9.4 9.4 8.6  NEUTROABS 11.3* 9.1*  --  8.5* 7.3  HGB 10.3* 10.5* 9.9* 9.8* 9.7*  HCT 32.3* 31.9* 30.6* 29.6* 29.3*  MCV 87.8 87.9 87.2 87.1 87.2  PLT 8* 10* 11* 9* 19*    Medications:    . chlorhexidine  15 mL Mouth Rinse BID  . Chlorhexidine Gluconate Cloth  6 each Topical Daily  . collagenase   Topical Daily  . feeding supplement (ENSURE ENLIVE)  237 mL  Oral BID BM  . feeding supplement (PRO-STAT SUGAR FREE 64)  30 mL Per Tube Daily  . mouth rinse  15 mL Mouth Rinse q12n4p  . pantoprazole sodium  40 mg Per Tube Daily  . polyethylene glycol  17 g Oral Daily  . potassium chloride  40 mEq Oral Once  . sodium chloride flush  10-40 mL Intracatheter Q12H  . thiamine  100 mg Per Tube Daily      Madelon Lips MD [facility] pgr [phone] 08/23/2018, 7:59 AM

## 2018-08-23 NOTE — Progress Notes (Signed)
Nutrition Follow-up  DOCUMENTATION CODES:   Non-severe (moderate) malnutrition in context of chronic illness  INTERVENTION:   - d/c Vital 1.5 tube feeding once current bottle is complete per order  Once current bottle of Vital 1.5 is complete: - Initiate Osmolite 1.5 cal @ 50 ml/hr (1200 ml/day) via Cortrak - Continue Pro-stat 30 ml daily  Tube feeding regimen provides 1900 kcal, 90 grams of protein, and 914 ml of H2O (90% of kcal needs, 82% of protein needs).  - Continue Ensure Enlive po BID, each supplement provides 350 kcal and 20 grams of protein  - RD to monitor for PO intake and adjust TF regimen as appropriate  NUTRITION DIAGNOSIS:   Moderate Malnutrition related to chronic illness (CHF) as evidenced by moderate fat depletion, moderate muscle depletion, severe muscle depletion.  Ongoing, being addressed via oral nutrition supplements, diet advancement, and TF  GOAL:   Patient will meet greater than or equal to 90% of their needs  Progressing  MONITOR:   PO intake, Supplement acceptance, Labs, I & O's, Weight trends, Skin, TF tolerance  REASON FOR ASSESSMENT:   Consult Enteral/tube feeding initiation and management  ASSESSMENT:   53 year old male who presented to the ED on 4/2 with AMS and weakness. PMH of CHF, HTN. Pt admitted with decompensated biventricular heart failure and shock.  4/03 - CRRT initiated 4/04 - SLP evaluation with recommendations for regular diet and thin liquids 4/06- Cortrak placed and TF initiated 4/08 - CRRT off  Vascular surgery saw pt today and signed off. Per note, pt will need aggressive wound care to left lower extremity.  Given Producer, television/film/video of specialty tube feeding formulas, will switch pt to standard tube feeding formula once current bottle is empty.  [place] conversations ongoing.  Weight stable since RD visit yesterday. Per RN edema assessment, pt remains with deep pitting edema to BLE and perineal area. Pt with  non-pitting edema to BUE and moderate pitting edema to sacral area.  Meal Completion: 10-20%  Medications reviewed and include: Ensure Enlive BID, Protonix, Miralax, KCl 40 mEq once, thiamine, IV abx, Dobutamine drip  Labs reviewed: potassium 2.7 (L) - replacement orders in, hemoglobin 9.7 (L), platelets 19 (L) CBG's: 130, 132, 129 x 24 hours  UOP: 3670 ml x 24 hours I/O's: -14.8 L since admit  Diet Order:   Diet Order            Diet 2 gram sodium Room service appropriate? Yes; Fluid consistency: Thin  Diet effective now              EDUCATION NEEDS:   No education needs have been identified at this time  Skin:  Skin Assessment: Skin Integrity Issues: DTI: left buttocks, left thigh Other: non-pressure wound to scrotum, weeping from BLE  Last BM:  08/23/18 large type 6  Height:   Ht Readings from Last 1 Encounters:  08/16/18 6' (1.829 m)    Weight:   Wt Readings from Last 1 Encounters:  08/23/18 84.6 kg    Ideal Body Weight:  80.9 kg  BMI:  Body mass index is 25.3 kg/m.  Estimated Nutritional Needs:   Kcal:  2100-2300  Protein:  110-125 grams  Fluid:  per MD    Gaynell Face, MS, RD, LDN Inpatient Clinical Dietitian Pager: [phone] Weekend/After Hours: [phone]

## 2018-08-23 NOTE — Progress Notes (Signed)
[facility] Progress Note Patient Name: Kyle Conrad DOB: 1965-08-28 MRN: [id number]   Date of Service  08/23/2018  HPI/Events of Note  K 2.7, creatinine 1.28 trending up  eICU Interventions  K 40 meqs x1 ordered     Intervention Category Major Interventions: Electrolyte abnormality - evaluation and management  Judd Lien 08/23/2018, 6:09 AM

## 2018-08-23 NOTE — Progress Notes (Signed)
ABI has been completed.   Preliminary results in CV Proc.   Kyle Conrad 08/23/2018 9:54 AM

## 2018-08-23 NOTE — Progress Notes (Signed)
Physical Therapy Treatment Patient Details Name: Kyle Conrad MRN: [id number] DOB: [birth year] Today's Date: 08/23/2018    History of Present Illness Pt is a 54 y.o. male admitted 08/16/18 with AMS after being found down by neighbor; treated for cardiogenic shock, septic shock with citrobacter bactermeia. Also with AKI; on CRRT 4/3-4/8. Has remained encephalopathic throughout admission. Head CT 4/4 with no acute changes. PMH includes biventricular HF, HTN.    PT Comments    Pt with very slow progress. Continue to recommend SNF.   Follow Up Recommendations  SNF;Supervision/Assistance - 24 hour     Equipment Recommendations  Other (comment)(To be determined at next venue)    Recommendations for Other Services       Precautions / Restrictions Precautions Precautions: Fall Restrictions Weight Bearing Restrictions: No    Mobility  Bed Mobility Overal bed mobility: Needs Assistance Bed Mobility: Supine to Sit;Sit to Supine     Supine to sit: Mod assist Sit to supine: Max assist   General bed mobility comments: Assist to bring legs off of bed, elevate trunk into sitting and bring hips to EOB. Assist to lower trunk and bring feet back up into bed. Pt trying to lie down with head at foot of bed.  Transfers                 General transfer comment: Did not attempt with 1 person assist  Ambulation/Gait                 Stairs             Wheelchair Mobility    Modified Rankin (Stroke Patients Only)       Balance Overall balance assessment: Needs assistance Sitting-balance support: No upper extremity supported;Feet supported Sitting balance-[name]: Fair Sitting balance - Comments: Sat EOB x 10 minutes with supervision                                    Cognition Arousal/Alertness: Awake/alert Behavior During Therapy: Flat affect Overall Cognitive Status: No family/caregiver present to determine baseline cognitive  functioning Area of Impairment: Orientation;Attention;Memory;Following commands;Safety/judgement;Awareness;Problem solving                 Orientation Level: Disoriented to;Place;Situation;Time Current Attention Level: Sustained Memory: Decreased recall of precautions;Decreased short-term memory Following Commands: Follows one step commands with increased time;Follows one step commands inconsistently Safety/Judgement: Decreased awareness of safety;Decreased awareness of deficits Awareness: Intellectual Problem Solving: Slow processing;Difficulty sequencing;Decreased initiation;Requires verbal cues;Requires tactile cues General Comments: Pt seeing a blue hat at the end of the bed. No hat present.      Exercises      General Comments        Pertinent Vitals/Pain Pain Assessment: Faces Faces Pain Scale: Hurts a little bit Pain Location: Generalized Pain Descriptors / Indicators: Grimacing Pain Intervention(s): Limited activity within patient's tolerance;Repositioned    Home Living                      Prior Function            PT Goals (current goals can now be found in the care plan section) Progress towards PT goals: Progressing toward goals    Frequency    Min 2X/week      PT Plan Current plan remains appropriate    Co-evaluation              AM-PAC  PT "6 Clicks" Mobility   Outcome Measure  Help needed turning from your back to your side while in a flat bed without using bedrails?: A Lot Help needed moving from lying on your back to sitting on the side of a flat bed without using bedrails?: A Lot Help needed moving to and from a bed to a chair (including a wheelchair)?: Total Help needed standing up from a chair using your arms (e.g., wheelchair or bedside chair)?: Total Help needed to walk in hospital room?: Total Help needed climbing 3-5 steps with a railing? : Total 6 Click Score: 8    End of Session   Activity Tolerance: Patient  tolerated treatment well Patient left: in bed;with call bell/phone within reach;with bed alarm set   PT Visit Diagnosis: Other abnormalities of gait and mobility (R26.89);Muscle weakness (generalized) (M62.81)     Time: 3300-7622 PT Time Calculation (min) (ACUTE ONLY): 20 min  Charges:  $Therapeutic Activity: 8-22 mins                     [place] Pager [phone] Office National 08/23/2018, 4:10 PM

## 2018-08-23 NOTE — Progress Notes (Addendum)
NAME:  Kyle Conrad, MRN:  [id number], DOB:  [birth year], LOS: 7 ADMISSION DATE:  08/16/2018, CONSULTATION DATE:  4/2 REFERRING MD:  Alvino Chapel CHIEF COMPLAINT:  encephalopathy   Brief History   53 year old male with biventricular heart failure, EF 10 to 15%.  Being admitted 4/2 with acute mental status change, and decompensated biventricular heart failure and shock. Blood cultures growing Enterobacteriaceae. He has had continuing worsening thrombocytopenia w/o signs of bleeding.  History of present illness   53 year old male patient followed at the heart failure clinic with known ejection fraction of 10 to 15% with biventricular heart failure. Presents to the emergency room on 4/2 after being found by a neighbor on the floor at his house.  Reportedly he had just fallen but it is unclear exactly when this occurred as it was unwitnessed.  He was confused, reporting weakness to be generalized.  His weight had increased.  Last weight recorded in the clinic was 94 kg, he weighed 112 kg on presentation.  On EMS arrival his blood glucose was 47.  On arrival he was hypothermic with temperature of 86.9 Fahrenheit hypotensive with systolic blood pressure initially in the 80s but then down into the 50s he had diffuse 3-4+ pitting edema requiring 4 L to keep saturations greater than 90%.  Because of his hypotension, as well as altered sensorium the critical care team was asked to evaluate.  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  Severe mitral valve regurgitation. Diverticulosis Angioedema with ACE inhibitor Iron deficiency anemia Possible cirrhosis  Significant Hospital Events   4/2 admitted with hypoxia, delirium, and decompensated biventricular heart failure with circulatory shock 4/3 started on CRRT for volume removal 4/3 BCID Enterobacteriaceae species> citrobacter freundii  Consults:  HF 4/2 Nephro  4/3 Palliative 4/7  Procedures:  R CVC 4/2>> R femoral HD cath 4/3>>  Significant Diagnostic Tests:  CXR 4/3>> increasing bilateral pulmonary infiltrates RUS 4/3>> no hydronephrosis, large volume ascites CT head 4/4>> no acute findings CXR 4/8>> cardiomegaly, improvement in bil infiltrates  Micro Data:  Blood cultures x2/2 > BCID Enterobacteriaceae > citrobacter ferundii U strep 4/2>>> negative u legionella 4/2>>> negative  Antimicrobials:  Levofloxacin 4/2 Vancomycin 4/2>4/3 Cefepamine 4/3>>4/5 Ceftriaxone 4/5>>  Interim history/subjective:  Patient denies chest pain, shortness of breath; endorses weakness.   Objective   Blood pressure 118/71, pulse (!) 123, temperature (!) 100.9 F (38.3 C), resp. rate (!) 24, height 6' (1.829 m), weight 84.6 kg, SpO2 94 %. CVP:  [6 mmHg-12 mmHg] 8 mmHg      Intake/Output Summary (Last 24 hours) at 08/23/2018 0819 Last data filed at 08/23/2018 0600 Gross per 24 hour  Intake 4227.32 ml  Output 3670 ml  Net 557.32 ml   Filed Weights   08/21/18 0500 08/22/18 0500 08/23/18 0449  Weight: 83.7 kg 84.7 kg 84.6 kg    Examination: General: awake, oriented to self, year and state only. HENT: anicteric sclera, EOMI Lungs: No increased work of breathing, CTAB on anterior lung fields Cardiovascular: RRR, no M/R Abdomen: soft, active bowel sounds Extremities: 1+ pitting edema on posterior thighs Neuro: EOMI, PERRL, face symmetric, facial light touch sensation intact but subjectively decreased on right; normal hearing bilaterally; soft palate rises symmetrically; bilateral shoulder shrug; midline shoulder shrug; RUE 2/5 proximal, 3/5 distal with poor coordination; LUE 2/5 proximal, 3/5 distal; Bil LE can only wiggle toes. Sensation intact in bil LEs, but absent in bil UEs.  Resolved Hospital Problem list   Acute Hypoxic Respiratory failure in setting of decompensated HF w/ volume overload/pulm edema  Assessment & Plan:   Cardiogenic and  septic shock: HD stable while weaning norepinephrine. On ceftriaxone for gram negative bacteremia to address sepsis. Had significant volume removal with CRRT; norepi has been discontinued and on dobutamine is being weaned down. On stress dose steroids.  Acute decompensated Biventricular Heart failure: Echo reveals EF 20-25% with severe RV systolic failure. Dry weight 64kgs, weight 112kg on admission. CVP down to 6 today Plan On dobutamine per HF- weaning Hold CRRT for now as appears euvolemic Daily weight, ins/outs  Citrobacter freundii bacteremia: Left foot with wet gangrene and petechiae; now febrile, no leukocytosis. - Ceftriaxone day 8 - vascular surgery consult  AKI, in setting of above, RUS w/o hydronephrosis. Likely ATN. On CRRT for volume removal (UF decreased), nephro expects another 48 hours or so and don't think he is an iHD candidate if renal recovery does not occur. Had significant increase in urine output which may reflect post ATN diuresis.  Acute metabolic encephalopathy, focal neuro deficits  CT head negative. Slow improvement; likely due to ongoing medical problems and malnutrition. Once less encephalopathic, can consider MRI brain to eval for CVA or evidence of watershed infarct if workup will contribute to management; still overall poor prognosis so may not be of value. Plan Cont supportive care Hold sedating meds  Thrombocytopenia - 50s on admission now 19 Likely related to DIC 2/2 sepsis. S/p 3 units plts and 2 units FFP this admission - will keep trending, and unless significant change or bleeding occurs, will hold further plt transfusions  Refractory Hypoglycemia- not of hypoglycemics at home, likely 2/2 sepsis and cardiac failure Plan Resolved with starting enteral feeding.  Best practice:  Diet: TF, 2g Na diet if alert enough; when taking PO regularly, we can stop TF Pain/Anxiety/Delirium protocol (if indicated): n/a VAP protocol (if indicated): n/a DVT  prophylaxis: SCDs GI prophylaxis: protonix Glucose control: n/a Mobility: BR due to profound weakness Code Status: FULL Family Communication: palliative care updated closest family, ongoing discussion about [place] which family is not comfortable with at this time Disposition: ICU  Labs   CBC: Recent Labs  Lab 08/19/18 0413  08/20/18 0414 08/21/18 0452 08/21/18 1500 08/22/18 0322 08/23/18 0448  WBC 9.8  --  12.3* 10.0 9.4 9.4 8.6  NEUTROABS 8.0*  --  11.3* 9.1*  --  8.5* 7.3  HGB 9.7*   < > 10.3* 10.5* 9.9* 9.8* 9.7*  HCT 30.3*   < > 32.3* 31.9* 30.6* 29.6* 29.3*  MCV 87.1  --  87.8 87.9 87.2 87.1 87.2  PLT 8*   < > 8* 10* 11* 9* 19*   < > = values in this interval not displayed.    Basic Metabolic Panel: Recent Labs  Lab 08/19/18 0413  08/20/18 0414 08/20/18 1806 08/21/18 0451 08/22/18 0322 08/23/18 0448  NA 136   < > 135 137 137 136 140  K 4.2   < > 4.5 3.9 3.5 3.3* 2.7*  CL 106   < > 104 104 102 103 99  CO2 23   < > 25 26 29 28 30   GLUCOSE 98   < > 96 129* 133* 124* 120*  BUN 26*   < > 23* 23* 26* 35* 56*  CREATININE 1.17   < > 1.07 1.07 1.06 0.93 1.28*  CALCIUM 7.6*   < > 8.0* 8.4* 8.3* 7.7* 7.8*  MG 2.0  --  2.2  --  2.2 2.2 1.9  PHOS 2.3*   < > 2.9 2.7 2.4* 1.7* 2.8   < > = values in this interval not displayed.   GFR: Estimated Creatinine Clearance: 72.4 mL/min (A) (by C-G formula based on SCr of 1.28 mg/dL (H)). Recent Labs  Lab 08/16/18 1333 08/16/18 1611 08/16/18 1645 08/16/18 2227  08/21/18 0452 08/21/18 1500 08/22/18 0322 08/23/18 0448  PROCALCITON  --  6.76  --   --   --   --   --   --   --   WBC 4.3 2.2*  --   --    < > 10.0 9.4 9.4 8.6  LATICACIDVEN 3.1*  --  4.3* 4.9*  --   --   --   --   --    < > = values in this interval not displayed.    Liver Function Tests: Recent Labs  Lab 08/16/18 1333 08/16/18 1611  08/18/18 0207  08/20/18 0414 08/20/18 1806 08/21/18 0451 08/22/18 0322 08/23/18 0448  AST 48* 54*  --  37  --   --   --    --   --   --   ALT 23 21  --  20  --   --   --   --   --   --   ALKPHOS 114 99  --  64  --   --   --   --   --   --   BILITOT 3.7* 3.7*  --  4.7*  --   --   --   --   --   --   PROT 6.0* 5.2*  --  5.1*  --   --   --   --   --   --   ALBUMIN 1.9* 1.7*   < > 1.4*   < > 1.5* 1.7* 1.5* 1.4* 1.4*   < > = values in this interval not displayed.   Recent Labs  Lab 08/16/18 1611  AMYLASE 28   Recent Labs  Lab 08/16/18 1333 08/17/18 0923  AMMONIA 20 35    ABG    Component Value Date/Time   PHART 7.458 (H) 08/19/2018 2047   PCO2ART 35.9 08/19/2018 2047   PO2ART 76.0 (L) 08/19/2018 2047   HCO3 25.7 08/19/2018 2047   TCO2 27 08/19/2018 2047   ACIDBASEDEF 7.0 (H) 08/17/2018 0912   O2SAT 93.0 08/23/2018 0450     Coagulation Profile: Recent Labs  Lab 08/16/18 1611 08/17/18 0927 08/19/18 1115  INR 4.3* 3.8* 2.2*    Cardiac Enzymes: Recent Labs  Lab 08/16/18 1333  CKTOTAL 194  TROPONINI <0.03    HbA1C: Hgb A1c MFr Bld  Date/Time Value Ref Range Status  09/10/2008 12:40 PM  4.6 - 6.1 % Final   6.0 (NOTE) The ADA recommends the following therapeutic goal for glycemic control related to Hgb A1c measurement: Goal of therapy: <6.5 Hgb A1c  Reference: American Diabetes Association: Clinical Practice Recommendations 2010, Diabetes Care, 2010, 33: (Suppl  1).    CBG: Recent Labs  Lab 08/21/18 2306 08/22/18 0755 08/22/18 1148 08/22/18 1546 08/23/18 0752  GLUCAP 127* 121* 125* 129* 132*    Past Medical History  He,  has a past medical history of CHF (congestive heart failure) (HCC) and Hypertension.   Surgical History    Past Surgical History:  Procedure Laterality Date  . CARDIAC CATHETERIZATION N/A 04/30/2015   Procedure: Right/Left Heart Cath and Coronary Angiography;  Surgeon: Elby Showers  Aundra Dubin, MD;  Location: [place] CV LAB;  Service: Cardiovascular;  Laterality: N/A;  . COLONOSCOPY WITH PROPOFOL N/A 05/01/2015   Procedure: COLONOSCOPY WITH PROPOFOL;   Surgeon: Wilford Corner, MD;  Location: [facility] ENDOSCOPY;  Service: Endoscopy;  Laterality: N/A;  . ESOPHAGOGASTRODUODENOSCOPY (EGD) WITH PROPOFOL N/A 05/01/2015   Procedure: ESOPHAGOGASTRODUODENOSCOPY (EGD) WITH PROPOFOL;  Surgeon: Wilford Corner, MD;  Location: [facility] ENDOSCOPY;  Service: Endoscopy;  Laterality: N/A;  . INGUINAL HERNIA REPAIR Left 03/24/2018   Procedure: HERNIA REPAIR INGUINAL INCARCERATED;  Surgeon: Georganna Skeans, MD;  Location: [place];  Service: General;  Laterality: Left;  . INSERTION OF MESH Left 03/24/2018   Procedure: INSERTION OF MESH;  Surgeon: Georganna Skeans, MD;  Location: [place];  Service: General;  Laterality: Left;  . SHOULDER SURGERY       Social History   reports that he has never smoked. He has never used smokeless tobacco. He reports that he does not drink alcohol or use drugs.   Family History   His family history includes Lung cancer in his mother.   Allergies Allergies  Allergen Reactions  . Lisinopril Swelling    REACTION: pt had a swelling episode.  His lips swelled  . Penicillins Hives    Has patient had a PCN reaction causing immediate rash, facial/tongue/throat swelling, SOB or lightheadedness with hypotension: Yes Has patient had a PCN reaction causing severe rash involving mucus membranes or skin necrosis: No Has patient had a PCN reaction that required hospitalization: No Has patient had a PCN reaction occurring within the last 10 years: No If all of the above answers are "NO", then may proceed with Cephalosporin use.     Home Medications  Prior to Admission medications   Medication Sig Start Date End Date Taking? Authorizing Provider  metolazone (ZAROXOLYN) 2.5 MG tablet Take 1 tablet (2.5 mg total) by mouth daily. As directed by heart failure clinic 06/05/18 09/03/18 Yes Tillery, Satira Mccallum, PA-C  potassium chloride 20 MEQ TBCR Take 20 mEq by mouth 2 (two) times daily. 06/05/18  Yes Shirley Friar, PA-C  torsemide (DEMADEX) 20 MG  tablet Take 2 tablets (40 mg total) by mouth 2 (two) times daily. 06/05/18  Yes Shirley Friar, PA-C  acetaminophen (TYLENOL) 500 MG tablet You can take 1000 mg every 8 hours as needed for pain.  Can alternate this with ibuprofen, or Tramadol.  Do not exceed 4000 mg of Tylenol(acetaminophen) per day it can harm your liver.  You can buy this over-the-counter at any drugstore. 03/28/18   Earnstine Regal, PA-C  docusate sodium (COLACE) 100 MG capsule Take 1 capsule (100 mg total) by mouth 2 (two) times daily. 03/20/18   Bonnielee Haff, MD  folic acid (FOLVITE) 1 MG tablet Take 1 tablet (1 mg total) by mouth daily. 03/20/18   Bonnielee Haff, MD  ibuprofen (ADVIL,MOTRIN) 200 MG tablet You can take 3 tablets every 6 hours as needed for pain.  You can alternate this with plain Tylenol or Tramadol.  You can buy this over-the-counter at any drugstore without a prescription. 03/28/18   Earnstine Regal, PA-C  losartan (COZAAR) 25 MG tablet Take 0.5 tablets (12.5 mg total) by mouth at bedtime. 04/10/18   Georgiana Shore, NP  magnesium oxide (MAG-OX) 400 MG tablet Take 1 tablet (400 mg total) by mouth daily. 04/11/18   Georgiana Shore, NP  Multiple Vitamin (MULTIVITAMIN WITH MINERALS) TABS tablet Take 1 tablet by mouth daily. 03/20/18   Bonnielee Haff, MD  polyethylene glycol [facility] /  GLYCOLAX) packet You can use this for constipation as instructed on the package directions.  You can buy this at any drugstore without a prescription. Patient not taking: Reported on 08/16/2018 03/28/18   Earnstine Regal, PA-C  traMADol (ULTRAM) 50 MG tablet Take 1 tablet (50 mg total) by mouth every 6 (six) hours as needed for moderate pain (Third line use if pain not relieved by PO Tylenol and Ibuprofen). 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  On exam, wet gangrene on  the left foot with coarse BS diffusely.  I reviewed CXR myself, pulmonary edema noted.  Discussed with resident and CHF team.  Will continue to titrate dobutamine down.  Reculture.  D/C rocephin.  Start cefepime and vanc.  F/U on cultures.  Consult vascular surgery for wet gangrene of the left leg.  Will transfer care to CHF team, if respiratory condition deteriorates or patient becomes more septic please call PCCM back.  The patient is critically ill with multiple organ systems failure and requires high complexity decision making for assessment and support, frequent evaluation and titration of therapies, application of advanced monitoring technologies and extensive interpretation of multiple databases.   Critical Care Time devoted to patient care services described in this note is  33  Minutes. This time reflects time of care of this signee Dr Jennet Maduro. This critical care time does not reflect procedure time, or teaching time or supervisory time of PA/NP/Med student/Med Resident etc but could involve care discussion time.  Rush Farmer, M.D. [facility] Pulmonary/Critical Care Medicine. Pager: [phone]. After hours pager: [phone].

## 2018-08-23 NOTE — Progress Notes (Signed)
Pharmacy Antibiotic Note  Kyle Conrad is a 53 y.o. male admitted on 08/16/2018 with sepsis.  Pharmacy consulted to restart cefepime and vancomycin dosing.  Patient febrile to 100.9 overnight, wbc within normal limits at 8. He was being treated for citrobacter bacteremia with ceftriaxone on 4/5.   Vancomycin 750 mg IV Q 8 hrs. Goal AUC 400-550. Expected AUC: 493 SCr used: 1.2   Plan: Restart vancomycin and cefepime Will plan on checking vancomycin levels in the next 24-48 hours New cultures drawn    Height: 6' (182.9 cm) Weight: 186 lb 8.2 oz (84.6 kg) IBW/kg (Calculated) : 77.6  Temp (24hrs), Avg:99.2 F (37.3 C), Min:97.3 F (36.3 C), Max:100.9 F (38.3 C)  Recent Labs  Lab 08/16/18 1333  08/16/18 1645 08/16/18 2227  08/20/18 0414 08/20/18 1806 08/21/18 0451 08/21/18 0452 08/21/18 1500 08/22/18 0322 08/23/18 0448  WBC 4.3   < >  --   --    < > 12.3*  --   --  10.0 9.4 9.4 8.6  CREATININE 1.90*   < >  --   --    < > 1.07 1.07 1.06  --   --  0.93 1.28*  LATICACIDVEN 3.1*  --  4.3* 4.9*  --   --   --   --   --   --   --   --    < > = values in this interval not displayed.    Estimated Creatinine Clearance: 72.4 mL/min (A) (by C-G formula based on SCr of 1.28 mg/dL (H)).    Allergies  Allergen Reactions  . Lisinopril Swelling    REACTION: pt had a swelling episode.  His lips swelled  . Penicillins Hives    Has patient had a PCN reaction causing immediate rash, facial/tongue/throat swelling, SOB or lightheadedness with hypotension: Yes Has patient had a PCN reaction causing severe rash involving mucus membranes or skin necrosis: No Has patient had a PCN reaction that required hospitalization: No Has patient had a PCN reaction occurring within the last 10 years: No If all of the above answers are "NO", then may proceed with Cephalosporin use.    Antimicrobials this admission: Vanc 4/2 x1, 4/9 Levaquin 4/2 x1 Cefepime 4/3 >> 4/6, 4/9> 4/6 ceftriaxone  >4/9  Microbiology results: 4/2 BCx: 2/4 citrobacter  4/2 MRSA PCR: negative  Erin Hearing PharmD., BCPS Clinical Pharmacist 08/23/2018 9:36 AM

## 2018-08-23 NOTE — Progress Notes (Signed)
  Speech Language Pathology Treatment: Dysphagia  Patient Details Name: Kyle Conrad MRN: [id number] DOB: 1966-02-09 Today's Date: 08/23/2018 Time: 4944-7395 SLP Time Calculation (min) (ACUTE ONLY): 9 min  Assessment / Plan / Recommendation Clinical Impression  Pt has no overt signs of aspiration or difficulty with mastication given additional time and assistance with feeding. RN and pt both deny any difficulties swallowing across meals. Recommend continuing current diet - no further acute SLP needs identified. Will sign off for now.   HPI HPI: 53 year old man who remains critically ill due to mixed distributive and cardiogenic shock. Evolving AKI due to cardiorenal syndrome. Enterobacter bacteremia. Hypoglycemia due to malnutrition. Cardiomegaly, vascular congestion with bibasilar infiltrates. Pt noted to have difficulty talking, CT head ordered      SLP Plan  All goals met       Recommendations  Diet recommendations: Regular;Thin liquid Liquids provided via: Cup;Straw Medication Administration: Whole meds with liquid Supervision: Staff to assist with self feeding;Full supervision/cueing for compensatory strategies Compensations: Slow rate;Small sips/bites Postural Changes and/or Swallow Maneuvers: Seated upright 90 degrees;Upright 30-60 min after meal                Oral Care Recommendations: Oral care BID Follow up Recommendations: Skilled Nursing facility SLP Visit Diagnosis: Dysphagia, unspecified (R13.10) Plan: All goals met       GO                Kyle Conrad 08/23/2018, 11:43 AM  Pollyann Glen, M.A. [place] Acute Environmental education officer [phone] Office [phone]

## 2018-08-24 LAB — GLUCOSE, CAPILLARY
Glucose-Capillary: 125 mg/dL — ABNORMAL HIGH (ref 70–99)
Glucose-Capillary: 132 mg/dL — ABNORMAL HIGH (ref 70–99)
Glucose-Capillary: 134 mg/dL — ABNORMAL HIGH (ref 70–99)
Glucose-Capillary: 134 mg/dL — ABNORMAL HIGH (ref 70–99)
Glucose-Capillary: 135 mg/dL — ABNORMAL HIGH (ref 70–99)
Glucose-Capillary: 144 mg/dL — ABNORMAL HIGH (ref 70–99)

## 2018-08-24 LAB — CBC WITH DIFFERENTIAL/PLATELET
Abs Immature Granulocytes: 0.08 10*3/uL — ABNORMAL HIGH (ref 0.00–0.07)
Basophils Absolute: 0 10*3/uL (ref 0.0–0.1)
Basophils Relative: 0 %
Eosinophils Absolute: 0 10*3/uL (ref 0.0–0.5)
Eosinophils Relative: 0 %
HCT: 27.3 % — ABNORMAL LOW (ref 39.0–52.0)
Hemoglobin: 8.6 g/dL — ABNORMAL LOW (ref 13.0–17.0)
Immature Granulocytes: 1 %
Lymphocytes Relative: 6 %
Lymphs Abs: 0.5 10*3/uL — ABNORMAL LOW (ref 0.7–4.0)
MCH: 27.6 pg (ref 26.0–34.0)
MCHC: 31.5 g/dL (ref 30.0–36.0)
MCV: 87.5 fL (ref 80.0–100.0)
Monocytes Absolute: 0.5 10*3/uL (ref 0.1–1.0)
Monocytes Relative: 5 %
Neutro Abs: 8.5 10*3/uL — ABNORMAL HIGH (ref 1.7–7.7)
Neutrophils Relative %: 88 %
Platelets: 22 10*3/uL — CL (ref 150–400)
RBC: 3.12 MIL/uL — ABNORMAL LOW (ref 4.22–5.81)
RDW: 17.9 % — ABNORMAL HIGH (ref 11.5–15.5)
WBC: 9.6 10*3/uL (ref 4.0–10.5)
nRBC: 0 % (ref 0.0–0.2)

## 2018-08-24 LAB — RENAL FUNCTION PANEL
Albumin: 1.3 g/dL — ABNORMAL LOW (ref 3.5–5.0)
Anion gap: 11 (ref 5–15)
BUN: 64 mg/dL — ABNORMAL HIGH (ref 6–20)
CO2: 31 mmol/L (ref 22–32)
Calcium: 7.7 mg/dL — ABNORMAL LOW (ref 8.9–10.3)
Chloride: 101 mmol/L (ref 98–111)
Creatinine, Ser: 1.18 mg/dL (ref 0.61–1.24)
GFR calc Af Amer: >60 mL/min (ref >60)
GFR calc non Af Amer: >60 mL/min (ref >60)
Glucose, Bld: 126 mg/dL — ABNORMAL HIGH (ref 70–99)
Phosphorus: 2.1 mg/dL — ABNORMAL LOW (ref 2.5–4.6)
Potassium: 3.3 mmol/L — ABNORMAL LOW (ref 3.5–5.1)
Sodium: 143 mmol/L (ref 135–145)

## 2018-08-24 LAB — COOXEMETRY PANEL
Carboxyhemoglobin: 2.4 % — ABNORMAL HIGH (ref 0.5–1.5)
Methemoglobin: 1.2 % (ref 0.0–1.5)
O2 Saturation: 70.3 %
Total hemoglobin: 11.6 g/dL — ABNORMAL LOW (ref 12.0–16.0)

## 2018-08-24 LAB — URINE CULTURE: Culture: NO GROWTH

## 2018-08-24 LAB — MAGNESIUM: Magnesium: 1.7 mg/dL (ref 1.7–2.4)

## 2018-08-24 MED ORDER — SPIRONOLACTONE 12.5 MG HALF TABLET
12.5000 mg | ORAL_TABLET | Freq: Every day | ORAL | Status: DC
  Administered 2018-08-24 – 2018-08-30 (×7): 12.5 mg via ORAL
  Filled 2018-08-24 (×8): qty 1

## 2018-08-24 MED ORDER — MAGNESIUM SULFATE 2 GM/50ML IV SOLN
2.0000 g | Freq: Once | INTRAVENOUS | Status: AC
  Administered 2018-08-24: 2 g via INTRAVENOUS
  Filled 2018-08-24: qty 50

## 2018-08-24 MED ORDER — POTASSIUM CHLORIDE 20 MEQ/15ML (10%) PO SOLN
40.0000 meq | Freq: Once | ORAL | Status: AC
  Administered 2018-08-24: 40 meq via ORAL
  Filled 2018-08-24: qty 30

## 2018-08-24 MED ORDER — POTASSIUM CHLORIDE CRYS ER 20 MEQ PO TBCR: 40.0000 meq | EXTENDED_RELEASE_TABLET | Freq: Once | ORAL | Status: DC

## 2018-08-24 NOTE — Progress Notes (Signed)
Patient ID: Kyle Conrad, male   DOB: [birth year], 53 y.o.   MRN: [id number]     Advanced Heart Failure Rounding Note  PCP-Cardiologist: No primary care provider on file.   Subjective:    Alert but confused, cannot tell me where he is but know that I am the doctor. Afebrile this morning, WBCs normal.  He is on cefepime and vancomycin currently, broadened after fever spike on 4/9.  Initial blood cultures with Citrobacter freundii, ?skin source.   ABIs normal, seen by vascular.  Foot skin breakdown likely due to marked initial swelling/volume overload with venous stasis changes.  Wound care has seen, feet are dressed.   Remains on dobutamine 2 this morning.  Off CVVH, good UOP again -1360 cc.  No weight yet.  CVP 6-7.   Platelets higher at 22K.   Tube feeds ongoing, also eating.     Objective:   Weight Range: 88.5 kg Body mass index is 25.74 kg/m.   Vital Signs:   Temp:  [97.8 F (36.6 C)-100.9 F (38.3 C)] 98.5 F (36.9 C) (04/10 0735) Pulse Rate:  [104-119] 112 (04/10 0735) Resp:  [13-29] 18 (04/10 0735) BP: (94-159)/(61-127) 104/64 (04/10 0735) SpO2:  [93 %-100 %] 99 % (04/10 0735) Weight:  [88.5 kg] 88.5 kg (04/09 1419) Last BM Date: 08/23/18  Weight change: Filed Weights   08/22/18 0500 08/23/18 0449 08/23/18 1419  Weight: 84.7 kg 84.6 kg 88.5 kg    Intake/Output:   Intake/Output Summary (Last 24 hours) at 08/24/2018 0753 Last data filed at 08/24/2018 0736 Gross per 24 hour  Intake 1440.05 ml  Output 3450 ml  Net -2009.95 ml      Physical Exam    General: NAD, chronically ill-appearing Neck: No JVD, no thyromegaly or thyroid nodule.  Lungs: Clear to auscultation bilaterally with normal respiratory effort. CV: Lateral PMI.  Heart regular S1/S2, no S3/S4, 2/6 HSM LLSB/apex.  No peripheral edema.   Abdomen: Soft, nontender, no hepatosplenomegaly, no distention.  Skin: Skin breakdown on feet, bandaged.  Neurologic: Alert, oriented to person but not place    Psych: Normal affect. Extremities: No clubbing or cyanosis.  HEENT: Normal.    Telemetry   Sinus tachy 100s (personally reviewed)  Labs    CBC Recent Labs    08/23/18 0448 08/24/18 0430  WBC 8.6 9.6  NEUTROABS 7.3 8.5*  HGB 9.7* 8.6*  HCT 29.3* 27.3*  MCV 87.2 87.5  PLT 19* 22*   Basic Metabolic Panel Recent Labs    08/23/18 0448 08/23/18 2030 08/24/18 0430  NA 140 142 143  K 2.7* 2.9* 3.3*  CL 99 104 101  CO2 30 31 31   GLUCOSE 120* 121* 126*  BUN 56* 63* 64*  CREATININE 1.28* 1.21 1.18  CALCIUM 7.8* 7.5* 7.7*  MG 1.9  --  1.7  PHOS 2.8  --  2.1*   Liver Function Tests Recent Labs    08/23/18 0448 08/24/18 0430  ALBUMIN 1.4* 1.3*   No results for input(s): LIPASE, AMYLASE in the last 72 hours. Cardiac Enzymes No results for input(s): CKTOTAL, CKMB, CKMBINDEX, TROPONINI in the last 72 hours.  BNP: BNP (last 3 results) Recent Labs    03/24/18 1435 06/05/18 1411 08/16/18 1611  BNP 2,471.9* 3,961.4* 2,433.3*    ProBNP (last 3 results) No results for input(s): PROBNP in the last 8760 hours.   D-Dimer No results for input(s): DDIMER in the last 72 hours. Hemoglobin A1C No results for input(s): HGBA1C in the last 72 hours. Fasting  Lipid Panel No results for input(s): CHOL, HDL, LDLCALC, TRIG, CHOLHDL, LDLDIRECT in the last 72 hours. Thyroid Function Tests No results for input(s): TSH, T4TOTAL, T3FREE, THYROIDAB in the last 72 hours.  Invalid input(s): FREET3  Other results:   Imaging    Dg Chest Port 1 View  Result Date: 08/23/2018 CLINICAL DATA:  Pneumonia EXAM: PORTABLE CHEST 1 VIEW COMPARISON:  08/22/2018 FINDINGS: Support devices are stable. Cardiomegaly with vascular congestion. Continued left lower lobe opacity, slightly increased since prior study. No visible effusions or acute bony abnormality. IMPRESSION: Cardiomegaly, vascular congestion. Left lower lobe atelectasis or infiltrate, slightly worsened since prior study.  Electronically Signed   By: Rolm Baptise M.D.   On: 08/23/2018 08:25   Vas [place] Burnard Bunting With/wo Tbi  Result Date: 08/23/2018 LOWER EXTREMITY DOPPLER STUDY Indications: Peripheral artery disease.  Performing Technologist: Abram Sander RVS  Examination Guidelines: A complete evaluation includes at minimum, Doppler waveform signals and systolic blood pressure reading at the level of bilateral brachial, anterior tibial, and posterior tibial arteries, when vessel segments are accessible. Bilateral testing is considered an integral part of a complete examination. Photoelectric Plethysmograph (PPG) waveforms and toe systolic pressure readings are included as required and additional duplex testing as needed. Limited examinations for reoccurring indications may be performed as noted.  ABI Findings: +--------+------------------+-----+---------+--------+  Right    Rt Pressure (mmHg) Index Waveform  Comment   +--------+------------------+-----+---------+--------+  Brachial 94                       triphasic           +--------+------------------+-----+---------+--------+  PTA      127                1.20  biphasic            +--------+------------------+-----+---------+--------+  DP       125                1.18  biphasic            +--------+------------------+-----+---------+--------+ +--------+------------------+-----+---------+-------+  Left     Lt Pressure (mmHg) Index Waveform  Comment  +--------+------------------+-----+---------+-------+  Brachial 106                      triphasic          +--------+------------------+-----+---------+-------+  PTA      127                1.20  biphasic           +--------+------------------+-----+---------+-------+  DP       128                1.21  biphasic           +--------+------------------+-----+---------+-------+ +-------+-----------+-----------+------------+------------+  ABI/TBI Today's ABI Today's TBI Previous ABI Previous TBI   +-------+-----------+-----------+------------+------------+  Right   1.20                                               +-------+-----------+-----------+------------+------------+  Left    1.21                                               +-------+-----------+-----------+------------+------------+  Summary: Right: Resting right ankle-brachial index is within normal range. No evidence of significant right lower extremity arterial disease. Left: Resting left ankle-brachial index is within normal range. No evidence of significant left lower extremity arterial disease.  *See table(s) above for measurements and observations.  Electronically signed by Monica Martinez MD on 08/23/2018 at 6:20:13 PM.   Final      Medications:     Scheduled Medications:  chlorhexidine  15 mL Mouth Rinse BID   Chlorhexidine Gluconate Cloth  6 each Topical Daily   collagenase   Topical Daily   feeding supplement (ENSURE ENLIVE)  237 mL Oral BID BM   feeding supplement (PRO-STAT SUGAR FREE 64)  30 mL Per Tube Daily   mouth rinse  15 mL Mouth Rinse q12n4p   pantoprazole sodium  40 mg Per Tube Daily   polyethylene glycol  17 g Oral Daily   potassium chloride  40 mEq Oral Once   sodium chloride flush  10-40 mL Intracatheter Q12H   spironolactone  12.5 mg Oral Daily   thiamine  100 mg Per Tube Daily    Infusions:  sodium chloride Stopped (08/18/18 0747)   sodium chloride Stopped (08/19/18 1159)   ceFEPime (MAXIPIME) IV 1 g (08/23/18 2144)   DOBUTamine 2 mcg/kg/min (08/23/18 1200)   feeding supplement (OSMOLITE 1.5 CAL) 1,000 mL (08/23/18 2301)   magnesium sulfate 1 - 4 g bolus IVPB     vancomycin 750 mg (08/24/18 0409)    PRN Medications: Place/Maintain arterial line **AND** sodium chloride, acetaminophen (TYLENOL) oral liquid 160 mg/5 mL, heparin, lip balm, ondansetron (ZOFRAN) IV, senna, sodium chloride flush   Assessment/Plan   1. 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  He is markedly volume overloaded with suspected mixed septic/cardiogenic shock.  Echo 4/3 with severe biventricular failure, LVEF 20-25% small effusion, moderate to severe MR and severe TR.  He continues on dobutamine 2, CVP 6-7 today.  Ongoing good UOP.  Mild rise in BUN with stable creatinine off CVVH. Co-ox 70%.  - No Lasix needed today.   - Decrease dobutamine to 1.  - Can add spironolactone 12.5 daily.  - He is not going to be a candidate for advanced therapies.  LVAD likely not an option regardless with severe RV dysfunction and has had very poor compliance with medical therapy.  2. ID: Suspect component of septic shock, PCT 6.76.  Citrobacter freundii in blood initially. ?Source from lower leg/foot skin breakdown. Febrile again am 4/9.  Afebrile today with normal WBCs.  CXR with LLL atelectasis versus infiltrate. - Antibiotics broadened again on 4/9, currently on vancomycin/cefepime. Will eventually narrow again if no new culture data.  3. AKI: Concern for ATN in setting of shock.  Was down at home for an undetermined length of time.  Initially required CVVH, now off. CVP down to 6-7 today, ongoing reasonable UOP likely post-ATN diuresis.  BUN rising mildly with stable creatinine.   - No Lasix today.  - Make sure he does not develop central hypovolemia, encourage po intake.   4. Thrombocytopenia: Noted at admission, normal plts in the past.  Suspect related to sepsis (normal fibrinogen, no schistocytes in blood).  Transfused 1 unit plts 4/5 and again on 4/6.  Today, platelets up to 22K, hopefully will continue to improve.  No overt bleeding.  5. Hypoglyemia: Resolved.   6. Neuro: Confusion/delirium, likely  in setting of critical illness/infection. Head CT 4/4 with no acute changes. Passed swallow study.  Aphonia resolved.  Confused today but can be  re-oriented. 7. DVT prophylaxis: Hold  heparin with low PLTs, can start when plts > 50K. SCDs.  8. Vascular: ABIs normal.  Suspect skin breakdown on foot related to venous stasis and marked swelling initially.    9. FEN: Continue tube feeds via [place]. He is eating as well.   10. Deconditioning: Severe.  Out of bed, PT/OT.  Will need SNF.     Overall poor long-term prognosis.  He may recover from the current episode but he does not have good future options. Palliative care following.  Patient remains confused, no close family but palliative care has been talking with cousin.   Length of Stay: 8  Loralie Champagne, MD  08/24/2018, 7:53 AM  Advanced Heart Failure Team Pager [phone] (M-F; 7a - 4p)  Please contact [facility] for night-coverage after hours (4p -7a ) and weekends on [email or web address]

## 2018-08-24 NOTE — Progress Notes (Signed)
Physical Therapy Treatment Patient Details Name: Kyle Conrad MRN: [id number] DOB: [birth year] Today's Date: 08/24/2018    History of Present Illness Pt is a 53 y.o. male admitted 08/16/18 with AMS after being found down by neighbor; treated for cardiogenic shock, septic shock with citrobacter bactermeia. Also with AKI; on CRRT 4/3-4/8. Has remained encephalopathic throughout admission. Head CT 4/4 with no acute changes. PMH includes biventricular HF, HTN.    PT Comments    Pt became agitated sitting EOB. Had to return pt to supine.  Follow Up Recommendations  SNF;Supervision/Assistance - 24 hour     Equipment Recommendations  Other (comment)(To be determined)    Recommendations for Other Services       Precautions / Restrictions Precautions Precautions: Fall Restrictions Weight Bearing Restrictions: No    Mobility  Bed Mobility Overal bed mobility: Needs Assistance Bed Mobility: Supine to Sit;Sit to Supine     Supine to sit: Max assist;+2 for physical assistance;+2 for safety/equipment Sit to supine: +2 for physical assistance;+2 for safety/equipment;Total assist   General bed mobility comments: hit therapist after return to supine. DOES not like therapists behind him. assist for BLE off the bed, trunk elevation, for supine Pt pushing strongly posterior requiring total A for return to bed with removal of base of bed  Transfers                 General transfer comment: Deferred, not safe this session  Ambulation/Gait                 Stairs             Wheelchair Mobility    Modified Rankin (Stroke Patients Only)       Balance Overall balance assessment: Needs assistance   Sitting balance-[name]: Poor Sitting balance - Comments: posterior lean - does not like for people to be behind him       Standing balance comment: NT                            Cognition Arousal/Alertness: Awake/alert Behavior During Therapy:  Agitated Overall Cognitive Status: No family/caregiver present to determine baseline cognitive functioning Area of Impairment: Orientation;Attention;Memory;Following commands;Safety/judgement;Awareness;Problem solving                 Orientation Level: Disoriented to;Place;Situation Current Attention Level: Sustained Memory: Decreased recall of precautions;Decreased short-term memory Following Commands: Follows one step commands inconsistently Safety/Judgement: Decreased awareness of safety;Decreased awareness of deficits Awareness: Intellectual Problem Solving: Slow processing;Difficulty sequencing;Decreased initiation;Requires verbal cues(wary of tactile cues) General Comments: Pt very agitated, hit therapist, mistrusting and making accusations of hospital not "giving him enough attention" initially agreeable to sit EOB - but became combative -unable to be reasoned with at this time      Exercises      General Comments        Pertinent Vitals/Pain Pain Assessment: Faces Faces Pain Scale: Hurts a little bit Pain Location: Generalized Pain Descriptors / Indicators: Grimacing Pain Intervention(s): Limited activity within patient's tolerance;Repositioned    Home Living                      Prior Function            PT Goals (current goals can now be found in the care plan section) Progress towards PT goals: Not progressing toward goals - comment    Frequency    Min 2X/week  PT Plan Current plan remains appropriate    Co-evaluation PT/OT/SLP Co-Evaluation/Treatment: Yes Reason for Co-Treatment: Necessary to address cognition/behavior during functional activity;For patient/therapist safety PT goals addressed during session: Mobility/safety with mobility OT goals addressed during session: ADL's and self-care      AM-PAC PT "6 Clicks" Mobility   Outcome Measure  Help needed turning from your back to your side while in a flat bed without using  bedrails?: Total Help needed moving from lying on your back to sitting on the side of a flat bed without using bedrails?: Total Help needed moving to and from a bed to a chair (including a wheelchair)?: Total Help needed standing up from a chair using your arms (e.g., wheelchair or bedside chair)?: Total Help needed to walk in hospital room?: Total Help needed climbing 3-5 steps with a railing? : Total 6 Click Score: 6    End of Session   Activity Tolerance: Treatment limited secondary to agitation Patient left: in bed;with call bell/phone within reach;with nursing/sitter in room Nurse Communication: Mobility status PT Visit Diagnosis: Other abnormalities of gait and mobility (R26.89);Muscle weakness (generalized) (M62.81)     Time: 4944-9675 PT Time Calculation (min) (ACUTE ONLY): 12 min  Charges:                        [place] Pager [phone] Office [place] 08/24/2018, 12:03 PM

## 2018-08-24 NOTE — Progress Notes (Signed)
Palliative Note:  Patient awake, confused. Unable to answer questions appropriately. Will follow some commands, however continues to show signs of agitation.   Cousin and aunt are in [place]. They continue to remain hopeful for some improvement however, becoming more accepting that his condition is critical with a poor prognosis. They are in discussion amongst themselves regarding how to proceed with medical decisions, while remaining hopeful patient will show some signs of improvement and provide them with some insight on how to proceed with care.   They request continued communication and updates to better assist them with making decisions and being at peace with decisions as they have to prepare to make them. They verbalized awareness of poor prognosis regardless of return of mental state, given co-morbidities and limited long-term options.   Palliative will continue to remain engage in detailed conversations with family and offering support to them and medical team.   Assessment: Gen: Awake, confused, agitated, will follow some commands, cachectic XBL:[id number] Resp: clear, diminished bases Skin: anasarca, bilateral foot dressings, clean, dry, intact  Plan: -Full Code -Continue to treat, family remains hopeful but also preparing for worst -PMT will continue to follow and support  The above conversation was completed via telephone due to the visitor restrictions during the COVID-19 pandemic. Thorough chart review and discussion with necessary members of the care team was completed as part of assessment.   Total Time: 45 min.   Greater than 50%  of this time was spent counseling and coordinating care related to the above assessment and plan  Alda Lea, AGPCNP-BC Palliative Medicine Team  Phone: [phone] Pager: [phone] Amion: N. [place]

## 2018-08-24 NOTE — Progress Notes (Signed)
HD catheter removed per order. Site clean, dry, intact. Vaseline gauze/gauze dressing applied and pressure held. RN aware patient needs to stay in bed for 30 minutes and to leave dressing on for 24 hours.

## 2018-08-24 NOTE — Plan of Care (Signed)

## 2018-08-24 NOTE — Progress Notes (Signed)
[facility] Progress Note    Assessment/ Plan:   1. Acute kidney Injury: initially oliguric.  Required CRRT for massive volume overload.  Etiology likely ATN after having been found down.  CRRT 4/3-4/8.  It appears that his creatinine is stabilized at 1.1-1.2 and he is still undergoing massive diuresis.  Given his severe cardiomyopathy, he is an extremely poor candidate for intermittent IHD.  If renal recovery does not occur, hospice would be appropriate.  I appreciate palliative care involvement.  He does not need further dialysis at this time.  I'll ask the IV team to remove his HD catheter.    2. 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  per AHF.  ON dobutamine @ 2 this AM, s/p levophed. Not a candidate for advanced therapies. Aldactone (low-dose) added this AM.    3. Mixed septic and cardiogenic shock: pressors as above, blood cultures 4/2 Citrobacter.  Initially on ceftriaxone and he has been broadened to vanc/ cefepime 4/9 after spiking another fever.  CXR showing LLL infiltrate, repeat cultures pending.  Has multiple wounds on extremities as well.  Appreciate vascular c/s  4. Coagulopathy and thrombocytopenia: thrombocytopenia slightly improving, now up to 22  5 Nutrition: severely malnourished with Albumin 1.5, previously hypoglycemic requiring D10 gtt, now has TF and PO intake (still poor)  6.  Hypokalemia: repletion prn  7.  Dispo: poor prognosis  Subjective:    Broadened to vanc/ cefepime yesterday, vascular c/s, appreciate assistance.  Rec aggressive wound care.  UOP excellent, Cr stabilized.  Still on dobutamine   Objective:   BP 104/64 (BP Location: Left Arm)   Pulse (!) 112   Temp 98.5 F (36.9 C) (Oral)   Resp 18   Ht 6\' 1"  (1.854 m)   Wt 88.5 kg   SpO2 99%   BMI 25.74 kg/m   Intake/Output Summary (Last 24 hours) at 08/24/2018 0802 Last data filed at 08/24/2018 2956 Gross per 24 hour  Intake 1373.07 ml   Output 3300 ml  Net -1926.93 ml   Weight change: 3.9 kg  Physical Exam: [id number], confused, a little better than yesterday CVS: tachycardic, + heave, but improving Resp: clear anteriorly Abd: distention much improved Ext: anasarca much improved MSK: feet dressed today, wounds not examined today  Imaging: Dg Chest Port 1 View  Result Date: 08/23/2018 CLINICAL DATA:  Pneumonia EXAM: PORTABLE CHEST 1 VIEW COMPARISON:  08/22/2018 FINDINGS: Support devices are stable. Cardiomegaly with vascular congestion. Continued left lower lobe opacity, slightly increased since prior study. No visible effusions or acute bony abnormality. IMPRESSION: Cardiomegaly, vascular congestion. Left lower lobe atelectasis or infiltrate, slightly worsened since prior study. Electronically Signed   By: Rolm Baptise M.D.   On: 08/23/2018 08:25   Vas [place] Burnard Bunting With/wo Tbi  Result Date: 08/23/2018 LOWER EXTREMITY DOPPLER STUDY Indications: Peripheral artery disease.  Performing Technologist: Abram Sander RVS  Examination Guidelines: A complete evaluation includes at minimum, Doppler waveform signals and systolic blood pressure reading at the level of bilateral brachial, anterior tibial, and posterior tibial arteries, when vessel segments are accessible. Bilateral testing is considered an integral part of a complete examination. Photoelectric Plethysmograph (PPG) waveforms and toe systolic pressure readings are included as required and additional duplex testing as needed. Limited examinations for reoccurring indications may be performed as noted.  ABI Findings: +--------+------------------+-----+---------+--------+ Right   Rt Pressure (mmHg)IndexWaveform Comment  +--------+------------------+-----+---------+--------+ [id number]  triphasic         +--------+------------------+-----+---------+--------+ PTA     127               1.20 biphasic           +--------+------------------+-----+---------+--------+ DP      125               1.18 biphasic          +--------+------------------+-----+---------+--------+ +--------+------------------+-----+---------+-------+ Left    Lt Pressure (mmHg)IndexWaveform Comment +--------+------------------+-----+---------+-------+ [id number]                    triphasic        +--------+------------------+-----+---------+-------+ PTA     127               1.20 biphasic         +--------+------------------+-----+---------+-------+ DP      128               1.21 biphasic         +--------+------------------+-----+---------+-------+ +-------+-----------+-----------+------------+------------+ ABI/TBIToday's ABIToday's TBIPrevious ABIPrevious TBI +-------+-----------+-----------+------------+------------+ Right  1.20                                           +-------+-----------+-----------+------------+------------+ Left   1.21                                           +-------+-----------+-----------+------------+------------+  Summary: Right: Resting right ankle-brachial index is within normal range. No evidence of significant right lower extremity arterial disease. Left: Resting left ankle-brachial index is within normal range. No evidence of significant left lower extremity arterial disease.  *See table(s) above for measurements and observations.  Electronically signed by Monica Martinez MD on 08/23/2018 at 6:20:13 PM.   Final     Labs: BMET Recent Labs  Lab 08/19/18 1546  08/20/18 0414 08/20/18 1806 08/21/18 0451 08/22/18 0322 08/23/18 0448 08/23/18 2030 08/24/18 0430  NA 137   < > 135 137 137 136 140 142 143  K 4.1   < > 4.5 3.9 3.5 3.3* 2.7* 2.9* 3.3*  CL 105  --  104 104 102 103 99 104 101  CO2 26  --  25 26 29 28 30 31 31   GLUCOSE 102*  --  96 129* 133* 124* 120* 121* 126*  BUN 24*  --  23* 23* 26* 35* 56* 63* 64*  CREATININE 1.17  --  1.07 1.07 1.06 0.93  1.28* 1.21 1.18  CALCIUM 7.8*  --  8.0* 8.4* 8.3* 7.7* 7.8* 7.5* 7.7*  PHOS 4.4  --  2.9 2.7 2.4* 1.7* 2.8  --  2.1*   < > = values in this interval not displayed.   CBC Recent Labs  Lab 08/21/18 0452 08/21/18 1500 08/22/18 0322 08/23/18 0448 08/24/18 0430  WBC 10.0 9.4 9.4 8.6 9.6  NEUTROABS 9.1*  --  8.5* 7.3 8.5*  HGB 10.5* 9.9* 9.8* 9.7* 8.6*  HCT 31.9* 30.6* 29.6* 29.3* 27.3*  MCV 87.9 87.2 87.1 87.2 87.5  PLT 10* 11* 9* 19* 22*    Medications:    . chlorhexidine  15 mL Mouth Rinse BID  . Chlorhexidine Gluconate Cloth  6 each Topical Daily  . collagenase   Topical Daily  . feeding supplement (ENSURE ENLIVE)  237  mL Oral BID BM  . feeding supplement (PRO-STAT SUGAR FREE 64)  30 mL Per Tube Daily  . mouth rinse  15 mL Mouth Rinse q12n4p  . pantoprazole sodium  40 mg Per Tube Daily  . polyethylene glycol  17 g Oral Daily  . potassium chloride  40 mEq Oral Once  . sodium chloride flush  10-40 mL Intracatheter Q12H  . spironolactone  12.5 mg Oral Daily  . thiamine  100 mg Per Tube Daily      Madelon Lips MD [facility] pgr [phone] 08/24/2018, 8:02 AM

## 2018-08-24 NOTE — Progress Notes (Signed)
Occupational Therapy Treatment Patient Details Name: Kyle Conrad MRN: [id number] DOB: Jul 23, 1965 Today's Date: 08/24/2018    History of present illness Pt is a 53 y.o. male admitted 08/16/18 with AMS after being found down by neighbor; treated for cardiogenic shock, septic shock with citrobacter bactermeia. Also with AKI; on CRRT 4/3-4/8. Has remained encephalopathic throughout admission. Head CT 4/4 with no acute changes. PMH includes biventricular HF, HTN.   OT comments  Pt not progressing towards OT goals this session, limited by agitation. Pt found with R mitt off pulling at lines and when OT stopped him. When PT entered he was initially agreeable to EOB transfer and participation in ADL tasks - max A +2 for EOB, but he was very wary of OT and DID NOT like when therapists attempted to support from behind for seated posterior lean. It became so bad that he required total A +2 to return safely to supine and the session was ended early due to patient agitation as he hit therapist (in mitt) Therapy will continue to follow acutely.    Follow Up Recommendations  SNF;Supervision/Assistance - 24 hour    Equipment Recommendations  Other (comment)(defer to next venue)    Recommendations for Other Services      Precautions / Restrictions Precautions Precautions: Fall Restrictions Weight Bearing Restrictions: No       Mobility Bed Mobility Overal bed mobility: Needs Assistance Bed Mobility: Supine to Sit;Sit to Supine     Supine to sit: Max assist;+2 for physical assistance;+2 for safety/equipment Sit to supine: +2 for physical assistance;+2 for safety/equipment;Total assist   General bed mobility comments: hit therapist after return to supine. DOES not like therapists behind him. assist for BLE off the bed, trunk elevation, for supine Pt pushing strongly posterior requiring total A for return to bed with removal of base of bed  Transfers                 General transfer  comment: Deferred, not safe this session    Balance Overall balance assessment: Needs assistance   Sitting balance-[name]: Poor Sitting balance - Comments: posterior lean - does not like for people to be behind him       Standing balance comment: NT                           ADL either performed or assessed with clinical judgement   ADL Overall ADL's : Needs assistance/impaired Eating/Feeding: NPO                       Toilet Transfer: (deferred)     Toileting - Clothing Manipulation Details (indicate cue type and reason): current using condom cath     Functional mobility during ADLs: (declined) General ADL Comments: confusion, transitioned to irritation and mis-trust     Vision       Perception     Praxis      Cognition Arousal/Alertness: Awake/alert Behavior During Therapy: Agitated Overall Cognitive Status: No family/caregiver present to determine baseline cognitive functioning Area of Impairment: Orientation;Attention;Memory;Following commands;Safety/judgement;Awareness;Problem solving                 Orientation Level: Disoriented to;Place;Situation Current Attention Level: Sustained Memory: Decreased recall of precautions;Decreased short-term memory Following Commands: Follows one step commands inconsistently Safety/Judgement: Decreased awareness of safety;Decreased awareness of deficits Awareness: Intellectual Problem Solving: Slow processing;Difficulty sequencing;Decreased initiation;Requires verbal cues(wary of tactile cues) General Comments: Pt very agitated, hit therapist,  mistrusting and making accusations of hospital not "giving him enough attention" initially agreeable to sit EOB - but became combative -unable to be reasoned with at this time        Exercises     Shoulder Instructions       General Comments      Pertinent Vitals/ Pain       Pain Assessment: Faces Faces Pain Scale: Hurts a little bit Pain  Location: Generalized Pain Descriptors / Indicators: Grimacing Pain Intervention(s): Limited activity within patient's tolerance;Repositioned  Home Living                                          Prior Functioning/Environment              Frequency  Min 2X/week        Progress Toward Goals  OT Goals(current goals can now be found in the care plan section)  Progress towards OT goals: Progressing toward goals  Acute Rehab OT Goals OT Goal Formulation: With patient Time For Goal Achievement: 09/05/18 Potential to Achieve Goals: [place] Discharge plan remains appropriate;Frequency remains appropriate    Co-evaluation    PT/OT/SLP Co-Evaluation/Treatment: Yes Reason for Co-Treatment: Necessary to address cognition/behavior during functional activity;For patient/therapist safety;To address functional/ADL transfers PT goals addressed during session: Balance OT goals addressed during session: ADL's and self-care      AM-PAC OT "6 Clicks" Daily Activity     Outcome Measure   Help from another person eating meals?: Total(NPO) Help from another person taking care of personal grooming?: Total Help from another person toileting, which includes using toliet, bedpan, or urinal?: Total Help from another person bathing (including washing, rinsing, drying)?: Total Help from another person to put on and taking off regular upper body clothing?: Total Help from another person to put on and taking off regular lower body clothing?: Total 6 Click Score: 6    End of Session Equipment Utilized During Treatment: Other (comment)  OT Visit Diagnosis: Unsteadiness on feet (R26.81);Other abnormalities of gait and mobility (R26.89);Muscle weakness (generalized) (M62.81);Other symptoms and signs involving cognitive function;Adult, failure to thrive (R62.7)   Activity Tolerance Treatment limited secondary to agitation   Patient Left in bed;with call bell/phone within  reach;with bed alarm set;with restraints reapplied   Nurse Communication Mobility status        Time: 3267-1245 OT Time Calculation (min): 12 min  Charges: OT General Charges $OT Visit: 1 Visit OT Treatments $Therapeutic Activity: 8-22 mins  Hulda Humphrey OTR/L Acute Rehabilitation Services Pager: [phone] Office: [place] 08/24/2018, 10:29 AM

## 2018-08-25 LAB — CBC WITH DIFFERENTIAL/PLATELET
Abs Immature Granulocytes: 0.04 10*3/uL (ref 0.00–0.07)
Basophils Absolute: 0 10*3/uL (ref 0.0–0.1)
Basophils Relative: 0 %
Eosinophils Absolute: 0.2 10*3/uL (ref 0.0–0.5)
Eosinophils Relative: 2 %
HCT: 27.1 % — ABNORMAL LOW (ref 39.0–52.0)
Hemoglobin: 8.4 g/dL — ABNORMAL LOW (ref 13.0–17.0)
Immature Granulocytes: 1 %
Lymphocytes Relative: 6 %
Lymphs Abs: 0.5 10*3/uL — ABNORMAL LOW (ref 0.7–4.0)
MCH: 27.5 pg (ref 26.0–34.0)
MCHC: 31 g/dL (ref 30.0–36.0)
MCV: 88.6 fL (ref 80.0–100.0)
Monocytes Absolute: 0.4 10*3/uL (ref 0.1–1.0)
Monocytes Relative: 5 %
Neutro Abs: 7.4 10*3/uL (ref 1.7–7.7)
Neutrophils Relative %: 86 %
Platelets: 36 10*3/uL — ABNORMAL LOW (ref 150–400)
RBC: 3.06 MIL/uL — ABNORMAL LOW (ref 4.22–5.81)
RDW: 18 % — ABNORMAL HIGH (ref 11.5–15.5)
WBC: 8.5 10*3/uL (ref 4.0–10.5)
nRBC: 0 % (ref 0.0–0.2)

## 2018-08-25 LAB — RENAL FUNCTION PANEL
Albumin: 1.3 g/dL — ABNORMAL LOW (ref 3.5–5.0)
Anion gap: 10 (ref 5–15)
BUN: 57 mg/dL — ABNORMAL HIGH (ref 6–20)
CO2: 32 mmol/L (ref 22–32)
Calcium: 7.9 mg/dL — ABNORMAL LOW (ref 8.9–10.3)
Chloride: 102 mmol/L (ref 98–111)
Creatinine, Ser: 0.99 mg/dL (ref 0.61–1.24)
GFR calc Af Amer: >60 mL/min (ref >60)
GFR calc non Af Amer: >60 mL/min (ref >60)
Glucose, Bld: 135 mg/dL — ABNORMAL HIGH (ref 70–99)
Phosphorus: 2.2 mg/dL — ABNORMAL LOW (ref 2.5–4.6)
Potassium: 3.6 mmol/L (ref 3.5–5.1)
Sodium: 144 mmol/L (ref 135–145)

## 2018-08-25 LAB — COOXEMETRY PANEL
Carboxyhemoglobin: 2.4 % — ABNORMAL HIGH (ref 0.5–1.5)
Methemoglobin: 1.1 % (ref 0.0–1.5)
O2 Saturation: 65 %
Total hemoglobin: 9 g/dL — ABNORMAL LOW (ref 12.0–16.0)

## 2018-08-25 LAB — GLUCOSE, CAPILLARY
Glucose-Capillary: 134 mg/dL — ABNORMAL HIGH (ref 70–99)
Glucose-Capillary: 85 mg/dL (ref 70–99)
Glucose-Capillary: 117 mg/dL — ABNORMAL HIGH (ref 70–99)
Glucose-Capillary: 105 mg/dL — ABNORMAL HIGH (ref 70–99)

## 2018-08-25 LAB — MAGNESIUM: Magnesium: 1.9 mg/dL (ref 1.7–2.4)

## 2018-08-25 MED ORDER — FUROSEMIDE 10 MG/ML IJ SOLN
40.0000 mg | Freq: Once | INTRAMUSCULAR | Status: AC
  Administered 2018-08-25: 40 mg via INTRAVENOUS
  Filled 2018-08-25: qty 4

## 2018-08-25 MED ORDER — POTASSIUM CHLORIDE CRYS ER 20 MEQ PO TBCR
40.0000 meq | EXTENDED_RELEASE_TABLET | Freq: Once | ORAL | Status: AC
  Administered 2018-08-25: 10:00:00 40 meq via ORAL
  Filled 2018-08-25: qty 2

## 2018-08-25 MED ORDER — DIGOXIN 125 MCG PO TABS
0.1250 mg | ORAL_TABLET | Freq: Every day | ORAL | Status: DC
  Administered 2018-08-25 – 2018-09-20 (×27): 0.125 mg via ORAL
  Filled 2018-08-25 (×27): qty 1

## 2018-08-25 NOTE — Progress Notes (Signed)
[facility] Progress Note    Assessment/ Plan:   1. Acute kidney Injury: initially oliguric.  Required CRRT for massive volume overload.  Etiology likely ATN after having been found down.  CRRT 4/3-4/8.  It appears that his creatinine is stabilized at 1.1-1.2 and he is still undergoing massive diuresis.  Given his severe cardiomyopathy, he is an extremely poor candidate for intermittent IHD.  If renal recovery does not occur, hospice would be appropriate.  I appreciate palliative care involvement.  He does not need further dialysis at this time.  HD cath removed.  No further recommendations at this time.  We will sign off.  Please let us know if we can be of further assistance.    2. 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  per AHF.  Weaning dobutamine  Not a candidate for advanced therapies. Aldactone (low-dose) added this AM.    3. Mixed septic and cardiogenic shock: pressors as above, blood cultures 4/2 Citrobacter.  Initially on ceftriaxone and he has been broadened to vanc/ cefepime 4/9 after spiking another fever.  CXR showing LLL infiltrate/ atalectasis, repeat cultures 4/9 no growth  Has multiple wounds on extremities as well.  Appreciate vascular c/s- not d/t PAD and likely massive vol overload  4. Thrombocytopenia: thrombocytopenia improving, now 36, initially required plt transfusions but none recently  5 Nutrition: severely malnourished with Albumin 1.5, previously hypoglycemic requiring D10 gtt, now has TF and PO intake (still poor)  6.  Hypokalemia: repletion prn  7.  Delirium: supportive care, per primary  8.  Dispo: poor prognosis, appreciate palliative care.  Subjective:    Dobutamine being weaned off today.  Still delirious, looking at "black things" overhead.  Cr is stabilized and UOP is good.  CVP a little generous today and so he has a dose of lasix.     Objective:   BP 105/82 (BP Location: Left Arm)   Pulse  (!) 107   Temp 98.3 F (36.8 C) (Oral)   Resp 19   Ht 6\' 1"  (1.854 m)   Wt 82.3 kg   SpO2 98%   BMI 23.94 kg/m   Intake/Output Summary (Last 24 hours) at 08/25/2018 9562 Last data filed at 08/25/2018 0816 Gross per 24 hour  Intake 2666.36 ml  Output 2600 ml  Net 66.36 ml   Weight change: -6.2 kg  Physical Exam: [id number], confused, a little better than yesterday CVS: tachycardic, + heave, but improving Resp: clear anteriorly Abd: distention much improved but still present Ext: anasarca much improved MSK: feet dressed today, wounds not examined today, can see wounds on toes  Imaging: Vas [place] Abi With/wo Tbi  Result Date: 08/23/2018 LOWER EXTREMITY DOPPLER STUDY Indications: Peripheral artery disease.  Performing Technologist: Abram Sander RVS  Examination Guidelines: A complete evaluation includes at minimum, Doppler waveform signals and systolic blood pressure reading at the level of bilateral brachial, anterior tibial, and posterior tibial arteries, when vessel segments are accessible. Bilateral testing is considered an integral part of a complete examination. Photoelectric Plethysmograph (PPG) waveforms and toe systolic pressure readings are included as required and additional duplex testing as needed. Limited examinations for reoccurring indications may be performed as noted.  ABI Findings: +--------+------------------+-----+---------+--------+ Right   Rt Pressure (mmHg)IndexWaveform Comment  +--------+------------------+-----+---------+--------+ [id number]                     triphasic         +--------+------------------+-----+---------+--------+ PTA     127  1.20 biphasic          +--------+------------------+-----+---------+--------+ DP      125               1.18 biphasic          +--------+------------------+-----+---------+--------+ +--------+------------------+-----+---------+-------+ Left    Lt Pressure (mmHg)IndexWaveform Comment  +--------+------------------+-----+---------+-------+ [id number]                    triphasic        +--------+------------------+-----+---------+-------+ PTA     127               1.20 biphasic         +--------+------------------+-----+---------+-------+ DP      128               1.21 biphasic         +--------+------------------+-----+---------+-------+ +-------+-----------+-----------+------------+------------+ ABI/TBIToday's ABIToday's TBIPrevious ABIPrevious TBI +-------+-----------+-----------+------------+------------+ Right  1.20                                           +-------+-----------+-----------+------------+------------+ Left   1.21                                           +-------+-----------+-----------+------------+------------+  Summary: Right: Resting right ankle-brachial index is within normal range. No evidence of significant right lower extremity arterial disease. Left: Resting left ankle-brachial index is within normal range. No evidence of significant left lower extremity arterial disease.  *See table(s) above for measurements and observations.  Electronically signed by Monica Martinez MD on 08/23/2018 at 6:20:13 PM.   Final     Labs: BMET Recent Labs  Lab 08/20/18 9798 08/20/18 1806 08/21/18 0451 08/22/18 0322 08/23/18 0448 08/23/18 2030 08/24/18 0430 08/25/18 0500  NA 135 137 137 136 140 142 143 144  K 4.5 3.9 3.5 3.3* 2.7* 2.9* 3.3* 3.6  CL 104 104 102 103 99 104 101 102  CO2 25 26 29 28 30 31 31  32  GLUCOSE 96 129* 133* 124* 120* 121* 126* 135*  BUN 23* 23* 26* 35* 56* 63* 64* 57*  CREATININE 1.07 1.07 1.06 0.93 1.28* 1.21 1.18 0.99  CALCIUM 8.0* 8.4* 8.3* 7.7* 7.8* 7.5* 7.7* 7.9*  PHOS 2.9 2.7 2.4* 1.7* 2.8  --  2.1* 2.2*   CBC Recent Labs  Lab 08/22/18 0322 08/23/18 0448 08/24/18 0430 08/25/18 0500  WBC 9.4 8.6 9.6 8.5  NEUTROABS 8.5* 7.3 8.5* 7.4  HGB 9.8* 9.7* 8.6* 8.4*  HCT 29.6* 29.3* 27.3* 27.1*  MCV 87.1  87.2 87.5 88.6  PLT 9* 19* 22* 36*    Medications:    . chlorhexidine  15 mL Mouth Rinse BID  . Chlorhexidine Gluconate Cloth  6 each Topical Daily  . collagenase   Topical Daily  . digoxin  0.125 mg Oral Daily  . feeding supplement (ENSURE ENLIVE)  237 mL Oral BID BM  . feeding supplement (PRO-STAT SUGAR FREE 64)  30 mL Per Tube Daily  . furosemide  40 mg Intravenous Once  . mouth rinse  15 mL Mouth Rinse q12n4p  . pantoprazole sodium  40 mg Per Tube Daily  . polyethylene glycol  17 g Oral Daily  . potassium chloride  40 mEq Oral Once  . sodium chloride flush  10-40 mL Intracatheter  Q12H  . spironolactone  12.5 mg Oral Daily  . thiamine  100 mg Per Tube Daily      Madelon Lips MD [facility] pgr [phone] 08/25/2018, 8:52 AM

## 2018-08-25 NOTE — Progress Notes (Signed)
Patient ID: Kyle Conrad, male   DOB: [birth year], 53 y.o.   MRN: [id number]     Advanced Heart Failure Rounding Note  PCP-Cardiologist: No primary care provider on file.   Subjective:    Alert, confused but can be re-directed. Afebrile this morning, WBCs normal.  He is on cefepime and vancomycin currently, broadened after fever spike on 4/9.  Initial blood cultures with Citrobacter freundii, ?skin source. Repeat cultures negative.   ABIs normal, seen by vascular.  Foot skin breakdown likely due to marked initial swelling/volume overload with venous stasis changes.  Wound care has seen, feet are dressed.   Remains on dobutamine 1 this morning.  Off CVVH, good UOP again.  CVP 10-11 today.   Platelets higher at 36K.   Tube feeds ongoing, also eating.    PT recommend SNF.    Objective:   Weight Range: 82.3 kg Body mass index is 23.94 kg/m.   Vital Signs:   Temp:  [97.6 F (36.4 C)-98.5 F (36.9 C)] 98.3 F (36.8 C) (04/11 0741) Pulse Rate:  [104-108] 107 (04/11 0741) Resp:  [14-22] 20 (04/11 0741) BP: (97-105)/(64-82) 105/82 (04/11 0741) SpO2:  [97 %-99 %] 98 % (04/11 0741) Weight:  [82.3 kg] 82.3 kg (04/11 0401) Last BM Date: 08/23/18  Weight change: Filed Weights   08/23/18 0449 08/23/18 1419 08/25/18 0401  Weight: 84.6 kg 88.5 kg 82.3 kg    Intake/Output:   Intake/Output Summary (Last 24 hours) at 08/25/2018 0811 Last data filed at 08/25/2018 0600 Gross per 24 hour  Intake 2512.76 ml  Output 2600 ml  Net -87.24 ml      Physical Exam    General: NAD Neck: No JVD, no thyromegaly or thyroid nodule.  Lungs: Clear to auscultation bilaterally with normal respiratory effort. CV: Lateral PMI.  Heart regular S1/S2, no S3/S4, 2/6 HSM LLSB/apex.  No peripheral edema.   Abdomen: Soft, nontender, no hepatosplenomegaly, no distention.  Skin: Intact without lesions or rashes.  Neurologic: Alert and oriented x 3.  Psych: Normal affect. Extremities: No clubbing or  cyanosis. Skin breakdown on feet, dressed.  HEENT: Normal.    Telemetry   Sinus tachy 100s (personally reviewed)  Labs    CBC Recent Labs    08/24/18 0430 08/25/18 0500  WBC 9.6 8.5  NEUTROABS 8.5* 7.4  HGB 8.6* 8.4*  HCT 27.3* 27.1*  MCV 87.5 88.6  PLT 22* 36*   Basic Metabolic Panel Recent Labs    08/24/18 0430 08/25/18 0500  NA 143 144  K 3.3* 3.6  CL 101 102  CO2 31 32  GLUCOSE 126* 135*  BUN 64* 57*  CREATININE 1.18 0.99  CALCIUM 7.7* 7.9*  MG 1.7 1.9  PHOS 2.1* 2.2*   Liver Function Tests Recent Labs    08/24/18 0430 08/25/18 0500  ALBUMIN 1.3* 1.3*   No results for input(s): LIPASE, AMYLASE in the last 72 hours. Cardiac Enzymes No results for input(s): CKTOTAL, CKMB, CKMBINDEX, TROPONINI in the last 72 hours.  BNP: BNP (last 3 results) Recent Labs    03/24/18 1435 06/05/18 1411 08/16/18 1611  BNP 2,471.9* 3,961.4* 2,433.3*    ProBNP (last 3 results) No results for input(s): PROBNP in the last 8760 hours.   D-Dimer No results for input(s): DDIMER in the last 72 hours. Hemoglobin A1C No results for input(s): HGBA1C in the last 72 hours. Fasting Lipid Panel No results for input(s): CHOL, HDL, LDLCALC, TRIG, CHOLHDL, LDLDIRECT in the last 72 hours. Thyroid Function Tests No results for  input(s): TSH, T4TOTAL, T3FREE, THYROIDAB in the last 72 hours.  Invalid input(s): FREET3  Other results:   Imaging    No results found.   Medications:     Scheduled Medications: . chlorhexidine  15 mL Mouth Rinse BID  . Chlorhexidine Gluconate Cloth  6 each Topical Daily  . collagenase   Topical Daily  . digoxin  0.125 mg Oral Daily  . feeding supplement (ENSURE ENLIVE)  237 mL Oral BID BM  . feeding supplement (PRO-STAT SUGAR FREE 64)  30 mL Per Tube Daily  . furosemide  40 mg Intravenous Once  . mouth rinse  15 mL Mouth Rinse q12n4p  . pantoprazole sodium  40 mg Per Tube Daily  . polyethylene glycol  17 g Oral Daily  . potassium  chloride  40 mEq Oral Once  . sodium chloride flush  10-40 mL Intracatheter Q12H  . spironolactone  12.5 mg Oral Daily  . thiamine  100 mg Per Tube Daily    Infusions: . sodium chloride Stopped (08/18/18 0747)  . sodium chloride Stopped (08/19/18 1159)  . ceFEPime (MAXIPIME) IV Stopped (08/24/18 2220)  . feeding supplement (OSMOLITE 1.5 CAL) 1,000 mL (08/23/18 2301)  . vancomycin Stopped (08/25/18 0311)    PRN Medications: Place/Maintain arterial line **AND** sodium chloride, acetaminophen (TYLENOL) oral liquid 160 mg/5 mL, heparin, lip balm, ondansetron (ZOFRAN) IV, senna, sodium chloride flush   Assessment/Plan   1. 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  He is markedly volume overloaded with suspected mixed septic/cardiogenic shock.  Echo 4/3 with severe biventricular failure, LVEF 20-25% small effusion, moderate to severe MR and severe TR.  He continues on dobutamine 1, CVP 10-11 today.  Ongoing good UOP.  BUN/creatinine stable off CVVH. Co-ox 65%.  - Will give 1 dose Lasix 40 mg IV today.    - Stop dobutamine.   - Add digoxin 0.125 daily.  - Continue spironolactone 12.5 daily.  - He is not going to be a candidate for advanced therapies.  LVAD likely not an option regardless with severe RV dysfunction and has had very poor compliance with medical therapy.  2. ID: Suspect component of septic shock, PCT 6.76.  Citrobacter freundii in blood initially. ?Source from lower leg/foot skin breakdown. Febrile again am 4/9.  Afebrile today with normal WBCs.  CXR with LLL atelectasis versus infiltrate. Repeat cultures NGTD.  - Antibiotics broadened again on 4/9, currently on vancomycin/cefepime. Will narrow again to ceftriaxone tomorrow if no new culture data.  3. AKI: Concern for ATN in setting of shock.  Was down at home for an  undetermined length of time.  Initially required CVVH, now off. CVP 10-11 today, ongoing reasonable UOP likely post-ATN diuresis.  BUN/creatinine stable. - One dose IV Lasix today with higher CVP.   4. Thrombocytopenia: Noted at admission, normal plts in the past.  Suspect related to sepsis (normal fibrinogen, no schistocytes in blood).  Transfused 1 unit plts 4/5 and again on 4/6.  Today, platelets up to 36K, hopefully will continue to improve.  No overt bleeding.  5. Hypoglyemia: Resolved.   6. Neuro: Confusion/delirium, likely in setting of critical illness/infection. Head CT 4/4 with no acute changes. Passed swallow study.  Aphonia resolved.  Confused today but can be re-oriented. 7. DVT prophylaxis: Hold [place] heparin with low PLTs, can start when plts > 50K. SCDs.  8. Vascular: ABIs normal.  Suspect skin breakdown on foot related to venous stasis and marked swelling initially.    9. FEN: Continue tube feeds via Cortrack. He is eating as well.   10. Deconditioning: Severe.  Out of bed, PT/OT.  Will need SNF.     Overall poor long-term prognosis.  He may recover from the current episode but he does not have good future options. Palliative care following.  Patient remains confused, I talked with his cousin yesterday.   Length of Stay: 9  Loralie Champagne, MD  08/25/2018, 8:11 AM  Advanced Heart Failure Team Pager [phone] (M-F; 7a - 4p)  Please contact [facility] for night-coverage after hours (4p -7a ) and weekends on [email or web address]

## 2018-08-25 NOTE — Plan of Care (Signed)
  Problem: Education: Goal: Knowledge of General Education information will improve Description Including pain rating scale, medication(s)/side effects and non-pharmacologic comfort measures Outcome: Progressing   Problem: Health Behavior/Discharge Planning: Goal: Ability to manage health-related needs will improve Outcome: Progressing   Problem: Clinical Measurements: Goal: Ability to maintain clinical measurements within normal limits will improve Outcome: Progressing Goal: Will remain free from infection Outcome: Progressing Goal: Diagnostic test results will improve Outcome: Progressing Goal: Respiratory complications will improve Outcome: Progressing Goal: Cardiovascular complication will be avoided Outcome: Progressing   Problem: Activity: Goal: Risk for activity intolerance will decrease Outcome: Progressing   Problem: Nutrition: Goal: Adequate nutrition will be maintained Outcome: Progressing   Problem: Coping: Goal: Level of anxiety will decrease Outcome: Progressing   Problem: Elimination: Goal: Will not experience complications related to bowel motility Outcome: Progressing Goal: Will not experience complications related to urinary retention Outcome: Progressing   Problem: Pain Managment: Goal: General experience of comfort will improve Outcome: Progressing   Problem: Safety: Goal: Ability to remain free from injury will improve Outcome: Progressing   Problem: Skin Integrity: Goal: Risk for impaired skin integrity will decrease Outcome: Progressing   Problem: Fluid Volume: Goal: Hemodynamic stability will improve Outcome: Progressing   Problem: Clinical Measurements: Goal: Diagnostic test results will improve Outcome: Progressing Goal: Signs and symptoms of infection will decrease Outcome: Progressing   Problem: Respiratory: Goal: Ability to maintain adequate ventilation will improve Outcome: Progressing   Problem: Education: Goal:  Knowledge of disease and its progression will improve Outcome: Progressing   Problem: Urinary Elimination: Goal: Progression of disease will be identified and treated Outcome: Progressing

## 2018-08-26 LAB — CBC WITH DIFFERENTIAL/PLATELET
Abs Immature Granulocytes: 0.04 10*3/uL (ref 0.00–0.07)
Basophils Absolute: 0 10*3/uL (ref 0.0–0.1)
Basophils Relative: 0 %
Eosinophils Absolute: 0.2 10*3/uL (ref 0.0–0.5)
Eosinophils Relative: 2 %
HCT: 27.4 % — ABNORMAL LOW (ref 39.0–52.0)
Hemoglobin: 8.3 g/dL — ABNORMAL LOW (ref 13.0–17.0)
Immature Granulocytes: 1 %
Lymphocytes Relative: 7 %
Lymphs Abs: 0.6 10*3/uL — ABNORMAL LOW (ref 0.7–4.0)
MCH: 27.3 pg (ref 26.0–34.0)
MCHC: 30.3 g/dL (ref 30.0–36.0)
MCV: 90.1 fL (ref 80.0–100.0)
Monocytes Absolute: 0.6 10*3/uL (ref 0.1–1.0)
Monocytes Relative: 7 %
Neutro Abs: 6.9 10*3/uL (ref 1.7–7.7)
Neutrophils Relative %: 83 %
Platelets: 82 10*3/uL — ABNORMAL LOW (ref 150–400)
RBC: 3.04 MIL/uL — ABNORMAL LOW (ref 4.22–5.81)
RDW: 18.2 % — ABNORMAL HIGH (ref 11.5–15.5)
WBC: 8.3 10*3/uL (ref 4.0–10.5)
nRBC: 0 % (ref 0.0–0.2)

## 2018-08-26 LAB — RENAL FUNCTION PANEL
Albumin: 1.4 g/dL — ABNORMAL LOW (ref 3.5–5.0)
Anion gap: 7 (ref 5–15)
BUN: 51 mg/dL — ABNORMAL HIGH (ref 6–20)
CO2: 34 mmol/L — ABNORMAL HIGH (ref 22–32)
Calcium: 7.9 mg/dL — ABNORMAL LOW (ref 8.9–10.3)
Chloride: 104 mmol/L (ref 98–111)
Creatinine, Ser: 0.94 mg/dL (ref 0.61–1.24)
GFR calc Af Amer: >60 mL/min (ref >60)
GFR calc non Af Amer: >60 mL/min (ref >60)
Glucose, Bld: 116 mg/dL — ABNORMAL HIGH (ref 70–99)
Phosphorus: 2.2 mg/dL — ABNORMAL LOW (ref 2.5–4.6)
Potassium: 4.4 mmol/L (ref 3.5–5.1)
Sodium: 145 mmol/L (ref 135–145)

## 2018-08-26 LAB — GLUCOSE, CAPILLARY
Glucose-Capillary: 104 mg/dL — ABNORMAL HIGH (ref 70–99)
Glucose-Capillary: 108 mg/dL — ABNORMAL HIGH (ref 70–99)
Glucose-Capillary: 116 mg/dL — ABNORMAL HIGH (ref 70–99)
Glucose-Capillary: 117 mg/dL — ABNORMAL HIGH (ref 70–99)
Glucose-Capillary: 130 mg/dL — ABNORMAL HIGH (ref 70–99)
Glucose-Capillary: 142 mg/dL — ABNORMAL HIGH (ref 70–99)

## 2018-08-26 LAB — COOXEMETRY PANEL
Carboxyhemoglobin: 2.6 % — ABNORMAL HIGH (ref 0.5–1.5)
Methemoglobin: 1.1 % (ref 0.0–1.5)
O2 Saturation: 65.6 %
Total hemoglobin: 8.9 g/dL — ABNORMAL LOW (ref 12.0–16.0)

## 2018-08-26 LAB — MAGNESIUM: Magnesium: 1.8 mg/dL (ref 1.7–2.4)

## 2018-08-26 MED ORDER — ISOSORB DINITRATE-HYDRALAZINE 20-37.5 MG PO TABS
0.5000 | ORAL_TABLET | Freq: Three times a day (TID) | ORAL | Status: DC
  Administered 2018-08-26 – 2018-09-20 (×66): 0.5 via ORAL
  Filled 2018-08-26 (×71): qty 1

## 2018-08-26 MED ORDER — TORSEMIDE 20 MG PO TABS
40.0000 mg | ORAL_TABLET | Freq: Every day | ORAL | Status: DC
  Administered 2018-08-26 – 2018-08-27 (×2): 40 mg via ORAL
  Filled 2018-08-26 (×2): qty 2

## 2018-08-26 MED ORDER — SODIUM CHLORIDE 0.9 % IV SOLN
2.0000 g | INTRAVENOUS | Status: AC
  Administered 2018-08-26 – 2018-08-29 (×4): 2 g via INTRAVENOUS
  Filled 2018-08-26 (×5): qty 20

## 2018-08-26 MED ORDER — HEPARIN SODIUM (PORCINE) 5000 UNIT/ML IJ SOLN
5000.0000 [IU] | Freq: Three times a day (TID) | INTRAMUSCULAR | Status: DC
  Administered 2018-08-26 – 2018-09-20 (×71): 5000 [IU] via SUBCUTANEOUS
  Filled 2018-08-26 (×72): qty 1

## 2018-08-26 NOTE — Plan of Care (Signed)
  Problem: Health Behavior/Discharge Planning: Goal: Ability to manage health-related needs will improve Outcome: Progressing   Problem: Clinical Measurements: Goal: Ability to maintain clinical measurements within normal limits will improve Outcome: Progressing Goal: Will remain free from infection Outcome: Progressing Goal: Diagnostic test results will improve Outcome: Progressing Goal: Respiratory complications will improve Outcome: Progressing Goal: Cardiovascular complication will be avoided Outcome: Progressing   Problem: Nutrition: Goal: Adequate nutrition will be maintained Outcome: Progressing   Problem: Coping: Goal: Level of anxiety will decrease Outcome: Progressing   Problem: Elimination: Goal: Will not experience complications related to bowel motility Outcome: Progressing Goal: Will not experience complications related to urinary retention Outcome: Progressing   Problem: Pain Managment: Goal: General experience of comfort will improve Outcome: Progressing   Problem: Fluid Volume: Goal: Hemodynamic stability will improve Outcome: Progressing   Problem: Clinical Measurements: Goal: Diagnostic test results will improve Outcome: Progressing Goal: Signs and symptoms of infection will decrease Outcome: Progressing   Problem: Education: Goal: Knowledge of disease and its progression will improve Outcome: Progressing   Problem: Urinary Elimination: Goal: Progression of disease will be identified and treated Outcome: Progressing

## 2018-08-26 NOTE — Progress Notes (Addendum)
Patient ID: Kyle Conrad, male   DOB: 1965-05-30, 53 y.o.   MRN: [id number]     Advanced Heart Failure Rounding Note  PCP-Cardiologist: No primary care provider on file.   Subjective:    Alert, confused but can be re-directed. Knows who I am. Afebrile this morning, WBCs normal.  He is on cefepime and vancomycin currently, broadened after fever spike on 4/9.  Initial blood cultures with Citrobacter freundii, ?skin source. Repeat cultures negative.   ABIs normal, seen by vascular.  Foot skin breakdown likely due to marked initial swelling/volume overload with venous stasis changes.  Wound care has seen, feet are dressed.   Now off dobutamine.  Off CVVH, good UOP again with one dose of Lasix 40 mg IV yesterday.  CVP 11-12 today. Co-ox 66%.  Platelets higher at 82K.   Tube feeds ongoing, also eating.    PT recommend SNF, very weak, has not been out of bed.     Objective:   Weight Range: 93 kg Body mass index is 27.05 kg/m.   Vital Signs:   Temp:  [97.5 F (36.4 C)-99 F (37.2 C)] 98.5 F (36.9 C) (04/12 0809) Pulse Rate:  [72-113] 102 (04/12 0814) Resp:  [14-23] 19 (04/12 0814) BP: (97-107)/(64-82) 103/79 (04/12 0809) SpO2:  [94 %-100 %] 94 % (04/12 0814) Weight:  [93 kg] 93 kg (04/12 0300) Last BM Date: 08/23/18  Weight change: Filed Weights   08/23/18 1419 08/25/18 0401 08/26/18 0300  Weight: 88.5 kg 82.3 kg 93 kg    Intake/Output:   Intake/Output Summary (Last 24 hours) at 08/26/2018 0839 Last data filed at 08/26/2018 0809 Gross per 24 hour  Intake 920 ml  Output 3525 ml  Net -2605 ml      Physical Exam    General: NAD Neck: JVP 8-9 cm, no thyromegaly or thyroid nodule.  Lungs: Clear to auscultation bilaterally with normal respiratory effort. CV: lateral PMI.  Heart regular S1/S2, no S3/S4, 2/6 HSM LLSB/apex.  No peripheral edema.   Abdomen: Soft, nontender, no hepatosplenomegaly, no distention.  Skin: Intact without lesions or rashes.  Neurologic: Alert  and oriented x 3.  Psych: Normal affect. Extremities: No clubbing or cyanosis. Skin breakdown on feet, dressed.  HEENT: Normal.   Telemetry   Sinus tachy 100s (personally reviewed)  Labs    CBC Recent Labs    08/25/18 0500 08/26/18 0509  WBC 8.5 8.3  NEUTROABS 7.4 6.9  HGB 8.4* 8.3*  HCT 27.1* 27.4*  MCV 88.6 90.1  PLT 36* 82*   Basic Metabolic Panel Recent Labs    08/25/18 0500 08/26/18 0509  NA 144 145  K 3.6 4.4  CL 102 104  CO2 32 34*  GLUCOSE 135* 116*  BUN 57* 51*  CREATININE 0.99 0.94  CALCIUM 7.9* 7.9*  MG 1.9 1.8  PHOS 2.2* 2.2*   Liver Function Tests Recent Labs    08/25/18 0500 08/26/18 0509  ALBUMIN 1.3* 1.4*   No results for input(s): LIPASE, AMYLASE in the last 72 hours. Cardiac Enzymes No results for input(s): CKTOTAL, CKMB, CKMBINDEX, TROPONINI in the last 72 hours.  BNP: BNP (last 3 results) Recent Labs    03/24/18 1435 06/05/18 1411 08/16/18 1611  BNP 2,471.9* 3,961.4* 2,433.3*    ProBNP (last 3 results) No results for input(s): PROBNP in the last 8760 hours.   D-Dimer No results for input(s): DDIMER in the last 72 hours. Hemoglobin A1C No results for input(s): HGBA1C in the last 72 hours. Fasting Lipid Panel No  results for input(s): CHOL, HDL, LDLCALC, TRIG, CHOLHDL, LDLDIRECT in the last 72 hours. Thyroid Function Tests No results for input(s): TSH, T4TOTAL, T3FREE, THYROIDAB in the last 72 hours.  Invalid input(s): FREET3  Other results:   Imaging    No results found.   Medications:     Scheduled Medications: . chlorhexidine  15 mL Mouth Rinse BID  . Chlorhexidine Gluconate Cloth  6 each Topical Daily  . collagenase   Topical Daily  . digoxin  0.125 mg Oral Daily  . feeding supplement (ENSURE ENLIVE)  237 mL Oral BID BM  . feeding supplement (PRO-STAT SUGAR FREE 64)  30 mL Per Tube Daily  . heparin injection (subcutaneous)  5,000 Units Subcutaneous Q8H  . mouth rinse  15 mL Mouth Rinse q12n4p  .  pantoprazole sodium  40 mg Per Tube Daily  . polyethylene glycol  17 g Oral Daily  . sodium chloride flush  10-40 mL Intracatheter Q12H  . spironolactone  12.5 mg Oral Daily  . thiamine  100 mg Per Tube Daily    Infusions: . sodium chloride Stopped (08/18/18 0747)  . sodium chloride Stopped (08/19/18 1159)  . ceFEPime (MAXIPIME) IV 1 g (08/25/18 2149)  . feeding supplement (OSMOLITE 1.5 CAL) 50 mL/hr at 08/25/18 2000  . vancomycin 750 mg (08/26/18 0148)    PRN Medications: Place/Maintain arterial line **AND** sodium chloride, acetaminophen (TYLENOL) oral liquid 160 mg/5 mL, lip balm, ondansetron (ZOFRAN) IV, senna, sodium chloride flush   Assessment/Plan   1. 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  He is markedly volume overloaded with suspected mixed septic/cardiogenic shock.  Echo 4/3 with severe biventricular failure, LVEF 20-25% small effusion, moderate to severe MR and severe TR.  He is now off dobutamine. CVP 11-12 today.  Ongoing good UOP.  BUN/creatinine stable off CVVH. Co-ox 66%.  - Start torsemide 40 mg daily and follow response.     - Add Bidil 1/2 tab tid.   - Continue digoxin 0.125 daily.  - Continue spironolactone 12.5 daily.  - He is not going to be a candidate for advanced therapies.  LVAD likely not an option regardless with severe RV dysfunction and has had very poor compliance with medical therapy.  2. ID: Suspect component of septic shock, PCT 6.76.  Citrobacter freundii in blood initially. ?Source from lower leg/foot skin breakdown. Febrile again am 4/9.  Afebrile today with normal WBCs.  CXR with LLL atelectasis versus infiltrate. Repeat cultures NGTD.  - Antibiotics broadened again on 4/9, currently on vancomycin/cefepime. Will narrow back to ceftriaxone today as most recent cultures remain negative.  3. AKI:  Concern for ATN in setting of shock.  Was down at home for an undetermined length of time.  Initially required CVVH, now off. CVP 11-12 today, ongoing reasonable UOP likely post-ATN diuresis.  BUN/creatinine stable. - Start po diuretic today as above.    4. Thrombocytopenia: Noted at admission, normal plts in the past.  Suspect related to sepsis (normal fibrinogen, no schistocytes in blood).  Transfused 1 unit plts 4/5 and again on 4/6.  Today, platelets up to 82K, hopefully will continue to improve.  No overt bleeding.  5. Hypoglyemia: Resolved.   6. Neuro: Confusion/delirium, likely in setting of critical illness/infection. Head CT 4/4 with no acute changes. Passed swallow study.  Aphonia resolved.  Confused today but can be re-oriented.  Knows who  I am but very tangential. 7. DVT prophylaxis: Can start [place] heparin today.  8. Vascular: ABIs normal.  Suspect skin breakdown on foot related to venous stasis and marked swelling initially.    9. FEN: Continue tube feeds via Cortrack. He is eating as well.   10. Deconditioning: Severe.  Out of bed, PT/OT.  Will need SNF.     Overall poor long-term prognosis.  He may recover from the current episode but he does not have good future options. Palliative care following.  Patient remains confused, I talked with his cousin on Friday.   Length of Stay: [place], MD  08/26/2018, 8:39 AM  Advanced Heart Failure Team Pager [phone] (M-F; 7a - 4p)  Please contact [facility] for night-coverage after hours (4p -7a ) and weekends on [email or web address]

## 2018-08-27 LAB — CBC WITH DIFFERENTIAL/PLATELET
Abs Immature Granulocytes: 0.04 10*3/uL (ref 0.00–0.07)
Basophils Absolute: 0 10*3/uL (ref 0.0–0.1)
Basophils Relative: 0 %
Eosinophils Absolute: 0.1 10*3/uL (ref 0.0–0.5)
Eosinophils Relative: 1 %
HCT: 24 % — ABNORMAL LOW (ref 39.0–52.0)
Hemoglobin: 7.5 g/dL — ABNORMAL LOW (ref 13.0–17.0)
Immature Granulocytes: 1 %
Lymphocytes Relative: 9 %
Lymphs Abs: 0.6 10*3/uL — ABNORMAL LOW (ref 0.7–4.0)
MCH: 28.5 pg (ref 26.0–34.0)
MCHC: 31.3 g/dL (ref 30.0–36.0)
MCV: 91.3 fL (ref 80.0–100.0)
Monocytes Absolute: 0.7 10*3/uL (ref 0.1–1.0)
Monocytes Relative: 10 %
Neutro Abs: 5.8 10*3/uL (ref 1.7–7.7)
Neutrophils Relative %: 79 %
Platelets: 111 10*3/uL — ABNORMAL LOW (ref 150–400)
RBC: 2.63 MIL/uL — ABNORMAL LOW (ref 4.22–5.81)
RDW: 18.3 % — ABNORMAL HIGH (ref 11.5–15.5)
WBC: 7.3 10*3/uL (ref 4.0–10.5)
nRBC: 0 % (ref 0.0–0.2)

## 2018-08-27 LAB — RENAL FUNCTION PANEL
Albumin: 1.4 g/dL — ABNORMAL LOW (ref 3.5–5.0)
Anion gap: 7 (ref 5–15)
BUN: 48 mg/dL — ABNORMAL HIGH (ref 6–20)
CO2: 35 mmol/L — ABNORMAL HIGH (ref 22–32)
Calcium: 7.7 mg/dL — ABNORMAL LOW (ref 8.9–10.3)
Chloride: 104 mmol/L (ref 98–111)
Creatinine, Ser: 0.97 mg/dL (ref 0.61–1.24)
GFR calc Af Amer: >60 mL/min (ref >60)
GFR calc non Af Amer: >60 mL/min (ref >60)
Glucose, Bld: 113 mg/dL — ABNORMAL HIGH (ref 70–99)
Phosphorus: 1.8 mg/dL — ABNORMAL LOW (ref 2.5–4.6)
Potassium: 4.4 mmol/L (ref 3.5–5.1)
Sodium: 146 mmol/L — ABNORMAL HIGH (ref 135–145)

## 2018-08-27 LAB — COOXEMETRY PANEL
Carboxyhemoglobin: 3 % — ABNORMAL HIGH (ref 0.5–1.5)
Methemoglobin: 1.1 % (ref 0.0–1.5)
O2 Saturation: 78 %
Total hemoglobin: 7.9 g/dL — ABNORMAL LOW (ref 12.0–16.0)

## 2018-08-27 LAB — GLUCOSE, CAPILLARY
Glucose-Capillary: 107 mg/dL — ABNORMAL HIGH (ref 70–99)
Glucose-Capillary: 110 mg/dL — ABNORMAL HIGH (ref 70–99)
Glucose-Capillary: 146 mg/dL — ABNORMAL HIGH (ref 70–99)
Glucose-Capillary: 144 mg/dL — ABNORMAL HIGH (ref 70–99)

## 2018-08-27 LAB — MAGNESIUM: Magnesium: 1.7 mg/dL (ref 1.7–2.4)

## 2018-08-27 MED ORDER — MAGNESIUM SULFATE 2 GM/50ML IV SOLN
2.0000 g | Freq: Once | INTRAVENOUS | Status: AC
  Administered 2018-08-27: 2 g via INTRAVENOUS
  Filled 2018-08-27: qty 50

## 2018-08-27 MED ORDER — OSMOLITE 1.5 CAL PO LIQD
237.0000 mL | Freq: Four times a day (QID) | ORAL | Status: DC
  Administered 2018-08-27 – 2018-08-28 (×3): 237 mL
  Filled 2018-08-27: qty 1000
  Filled 2018-08-27 (×2): qty 237
  Filled 2018-08-27 (×3): qty 1000

## 2018-08-27 MED ORDER — PRO-STAT SUGAR FREE PO LIQD
30.0000 mL | Freq: Two times a day (BID) | ORAL | Status: DC
  Administered 2018-08-27 – 2018-08-28 (×2): 30 mL
  Filled 2018-08-27 (×2): qty 30

## 2018-08-27 MED ORDER — TORSEMIDE 20 MG PO TABS
20.0000 mg | ORAL_TABLET | Freq: Every day | ORAL | Status: DC
  Administered 2018-08-28 – 2018-08-30 (×3): 20 mg via ORAL
  Filled 2018-08-27 (×3): qty 1

## 2018-08-27 NOTE — Progress Notes (Signed)
Physical Therapy Treatment Patient Details Name: Kyle Conrad MRN: [id number] DOB: [birth year] Today's Date: 08/27/2018    History of Present Illness Pt is a 53 y.o. male admitted 08/16/18 with AMS after being found down by neighbor; treated for cardiogenic shock, septic shock with citrobacter bactermeia. Also with AKI; on CRRT 4/3-4/8. Has remained encephalopathic throughout admission. Head CT 4/4 with no acute changes. PMH includes biventricular HF, HTN.    PT Comments    Initially pt was wary about therapist, not agitated, but slow to warm up.  Pt focused on catheter and lines.  Emphasis was on sit to stands and gait training/stamina.    Follow Up Recommendations  SNF;Supervision/Assistance - 24 hour     Equipment Recommendations  (TBA)    Recommendations for Other Services       Precautions / Restrictions Precautions Precautions: Fall    Mobility  Bed Mobility               General bed mobility comments: up in the chair on arrival  Transfers Overall transfer level: Needs assistance Equipment used: Rolling walker (2 wheeled) Transfers: Sit to/from Stand Sit to Stand: Mod assist;+2 physical assistance         General transfer comment: cues for hand placement and assist to help come forward.  Ambulation/Gait Ambulation/Gait assistance: Mod assist;+2 safety/equipment Gait Distance (Feet): 75 Feet(with 2 standing rest breaks to regroup/) Assistive device: Rolling walker (2 wheeled) Gait Pattern/deviations: Step-through pattern   Gait velocity interpretation: <1.31 ft/sec, indicative of household ambulator General Gait Details: slow unsteady steps with heavy use of the RW.  Cues for posture and positioning in the RW   Stairs             Wheelchair Mobility    Modified Rankin (Stroke Patients Only)       Balance Overall balance assessment: Needs assistance Sitting-balance support: No upper extremity supported Sitting balance-[name]:  Fair Sitting balance - Comments: moving around, reaching to the floor and back up without Ue assist   Standing balance support: Single extremity supported;Bilateral upper extremity supported Standing balance-[name]: Poor Standing balance comment: reliant on the RW                            Cognition Arousal/Alertness: Awake/alert Behavior During Therapy: Restless(easily escalates, but did not get fully agitated) Overall Cognitive Status: No family/caregiver present to determine baseline cognitive functioning                   Orientation Level: Disoriented to;Place;Situation Current Attention Level: Sustained Memory: Decreased recall of precautions;Decreased short-term memory Following Commands: Follows one step commands inconsistently Safety/Judgement: Decreased awareness of safety;Decreased awareness of deficits Awareness: Intellectual Problem Solving: Slow processing;Decreased initiation;Difficulty sequencing;Requires verbal cues        Exercises      General Comments General comments (skin integrity, edema, etc.): vss      Pertinent Vitals/Pain Pain Assessment: Faces Faces Pain Scale: No hurt    Home Living                      Prior Function            PT Goals (current goals can now be found in the care plan section) Acute Rehab PT Goals PT Goal Formulation: With patient Time For Goal Achievement: 09/05/18 Potential to Achieve Goals: Fair Progress towards PT goals: Progressing toward goals    Frequency  Min 2X/week      PT Plan Current plan remains appropriate    Co-evaluation              AM-PAC PT "6 Clicks" Mobility   Outcome Measure  Help needed turning from your back to your side while in a flat bed without using bedrails?: A Lot Help needed moving from lying on your back to sitting on the side of a flat bed without using bedrails?: A Lot Help needed moving to and from a bed to a chair (including a  wheelchair)?: A Lot Help needed standing up from a chair using your arms (e.g., wheelchair or bedside chair)?: A Lot Help needed to walk in hospital room?: A Lot Help needed climbing 3-5 steps with a railing? : A Lot 6 Click Score: 12    End of Session   Activity Tolerance: Patient tolerated treatment well Patient left: in chair;with call bell/phone within reach Nurse Communication: Mobility status PT Visit Diagnosis: Other abnormalities of gait and mobility (R26.89);Muscle weakness (generalized) (M62.81)     Time: 3662-9476 PT Time Calculation (min) (ACUTE ONLY): 22 min  Charges:  $Gait Training: 8-22 mins                     08/27/2018  Donnella Sham, PT Acute Rehabilitation Services [phone]  (pager) [phone]  (office)   Tessie Fass Sharece Fleischhacker 08/27/2018, 4:28 PM

## 2018-08-27 NOTE — Progress Notes (Signed)
Patient ID: Kyle Conrad, male   DOB: 04/18/1966, 53 y.o.   MRN: [id number]     Advanced Heart Failure Rounding Note  PCP-Cardiologist: No primary care provider on file.   Subjective:    Sitting up in chair. Much more alert. Oriented x 3 currently but still confused at time per RN.   Co-ox 78% of dobutamine. Creatinine has normalized. On po diuretics. Great urine output (4.3L) Weight up a pound. CVP 3-4  Denies CP or SOB   Tube feeds ongoing, also eating.    PT recommend SNF   Objective:   Weight Range: 93.4 kg Body mass index is 27.18 kg/m.   Vital Signs:   Temp:  [97.5 F (36.4 C)-99 F (37.2 C)] 98.1 F (36.7 C) (04/13 1113) Pulse Rate:  [94-117] 117 (04/13 0749) Resp:  [16-26] 23 (04/13 0333) BP: (94-110)/(56-78) 97/71 (04/13 1113) SpO2:  [91 %-100 %] 98 % (04/13 0749) Weight:  [93.4 kg] 93.4 kg (04/13 0333) Last BM Date: 08/23/18  Weight change: Filed Weights   08/25/18 0401 08/26/18 0300 08/27/18 0333  Weight: 82.3 kg 93 kg 93.4 kg    Intake/Output:   Intake/Output Summary (Last 24 hours) at 08/27/2018 1133 Last data filed at 08/27/2018 1100 Gross per 24 hour  Intake 1968 ml  Output 3950 ml  Net -1982 ml      Physical Exam    General: Chronically ill appearing. Sitting up in chair. . No resp difficulty HEENT: normal + Cor-trak Neck: supple. JVP to jaw . Carotids 2+ bilat; no bruits. No lymphadenopathy or thryomegaly appreciated. Cor: PMI laterally displaced. Regular tachy. + s3 Lungs: clear Abdomen: soft, nontender, nondistended. No hepatosplenomegaly. No bruits or masses. Good bowel sounds. Extremities: no cyanosis, clubbing, rash, edema  + LE wounds Neuro: alert & orientedx3, cranial nerves grossly intact. moves all 4 extremities w/o difficulty. Affect pleasant   Telemetry   Sinus tachy 95-105 (personally reviewed)  Labs    CBC Recent Labs    08/26/18 0509 08/27/18 0524  WBC 8.3 7.3  NEUTROABS 6.9 5.8  HGB 8.3* 7.5*  HCT 27.4*  24.0*  MCV 90.1 91.3  PLT 82* 409*   Basic Metabolic Panel Recent Labs    08/26/18 0509 08/27/18 0524  NA 145 146*  K 4.4 4.4  CL 104 104  CO2 34* 35*  GLUCOSE 116* 113*  BUN 51* 48*  CREATININE 0.94 0.97  CALCIUM 7.9* 7.7*  MG 1.8 1.7  PHOS 2.2* 1.8*   Liver Function Tests Recent Labs    08/26/18 0509 08/27/18 0524  ALBUMIN 1.4* 1.4*   No results for input(s): LIPASE, AMYLASE in the last 72 hours. Cardiac Enzymes No results for input(s): CKTOTAL, CKMB, CKMBINDEX, TROPONINI in the last 72 hours.  BNP: BNP (last 3 results) Recent Labs    03/24/18 1435 06/05/18 1411 08/16/18 1611  BNP 2,471.9* 3,961.4* 2,433.3*    ProBNP (last 3 results) No results for input(s): PROBNP in the last 8760 hours.   D-Dimer No results for input(s): DDIMER in the last 72 hours. Hemoglobin A1C No results for input(s): HGBA1C in the last 72 hours. Fasting Lipid Panel No results for input(s): CHOL, HDL, LDLCALC, TRIG, CHOLHDL, LDLDIRECT in the last 72 hours. Thyroid Function Tests No results for input(s): TSH, T4TOTAL, T3FREE, THYROIDAB in the last 72 hours.  Invalid input(s): FREET3  Other results:   Imaging    No results found.   Medications:     Scheduled Medications: . chlorhexidine  15 mL Mouth Rinse BID  .  Chlorhexidine Gluconate Cloth  6 each Topical Daily  . collagenase   Topical Daily  . digoxin  0.125 mg Oral Daily  . feeding supplement (ENSURE ENLIVE)  237 mL Oral BID BM  . feeding supplement (PRO-STAT SUGAR FREE 64)  30 mL Per Tube Daily  . heparin injection (subcutaneous)  5,000 Units Subcutaneous Q8H  . isosorbide-hydrALAZINE  0.5 tablet Oral TID  . mouth rinse  15 mL Mouth Rinse q12n4p  . pantoprazole sodium  40 mg Per Tube Daily  . polyethylene glycol  17 g Oral Daily  . sodium chloride flush  10-40 mL Intracatheter Q12H  . spironolactone  12.5 mg Oral Daily  . thiamine  100 mg Per Tube Daily  . torsemide  40 mg Oral Daily    Infusions: .  sodium chloride Stopped (08/18/18 0747)  . sodium chloride Stopped (08/19/18 1159)  . cefTRIAXone (ROCEPHIN)  IV 2 g (08/27/18 1045)  . feeding supplement (OSMOLITE 1.5 CAL) 1,000 mL (08/26/18 1512)    PRN Medications: Place/Maintain arterial line **AND** sodium chloride, acetaminophen (TYLENOL) oral liquid 160 mg/5 mL, lip balm, ondansetron (ZOFRAN) IV, senna, sodium chloride flush   Assessment/Plan   1. 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  He is markedly volume overloaded with suspected mixed septic/cardiogenic shock.  Echo 4/3 with severe biventricular failure, LVEF 20-25% small effusion, moderate to severe MR and severe TR.  He is now off dobutamine. CVP 3 today on oral torsemide.  Ongoing good UOP.  BUN/creatinine stable off CVVH. Co-ox 78%.  - Start torsemide 40 mg daily and follow response.     - Continue Bidil 1/2 tab tid.  BP to low too titrate - Continue digoxin 0.125 daily.  - Continue spironolactone 12.5 daily.  - No b-blocker with recent shock. No ACE/ARB/ARN with recent AKI - CVP 3-4. Cut torsemide back to 20 daily for now (was on 40 bid at home) - He is not going to be a candidate for advanced therapies.  LVAD likely not an option regardless with severe RV dysfunction and has had very poor compliance with medical therapy.  2. ID: Suspect component of septic shock, PCT 6.76.  Citrobacter freundii in blood initially. ?Source from lower leg/foot skin breakdown. Febrile again am 4/9.  Afebrile today with normal WBCs.  CXR with LLL atelectasis versus infiltrate. Repeat cultures NGTD.  - Antibiotics broadened again on 4/9, currently on vancomycin/cefepime.  - Narrowed back to ceftriaxone 4/12 - Most recent cultures remain negative.  - Hopefully can pull Foley tomorrow if more consistently oriented  3. AKI: Concern for ATN in  setting of shock.  Was down at home for an undetermined length of time.  Initially required CVVH, now off. CVP 3-4 today, ongoing reasonable UOP likely post-ATN diuresis.  BUN/creatinine stable. - Cut torsemide back to 20 daily     4. Thrombocytopenia: Noted at admission, normal plts in the past.  Suspect related to sepsis (normal fibrinogen, no schistocytes in blood).  Transfused 1 unit plts 4/5 and again on 4/6.  Today, platelets up to 111K,No overt bleeding.  5. Hypoglyemia: Resolved.   6. Neuro: Confusion/delirium, likely in setting of critical illness/infection. Head CT 4/4 with no acute changes. Passed swallow study.  Aphonia resolved.  Confused today but can be re-oriented.  Knows who I am but very tangential. 7. DVT prophylaxis: - On  heparin t 8. Vascular:  ABIs normal.  Suspect skin breakdown on foot related to venous stasis and marked swelling initially.    9. FEN: severe protein calorie malnutrition. Continue tube feeds via Cortrack. He is eating as well.   10. LE wounds and sacral decub - WOC following  11. Hypomagnesemia  - Will sup 11. Deconditioning: Severe.  Out of bed, PT/OT.  Will need SNF.      Overall poor long-term prognosis.  He may recover from the current episode but he does not have good future options. Palliative care following but have not seen since 4/8 - will re-engage them . Remains Full Code for now.  Length of Stay: [place], MD  08/27/2018, 11:33 AM  Advanced Heart Failure Team Pager [phone] (M-F; 7a - 4p)  Please contact [facility] for night-coverage after hours (4p -7a ) and weekends on [email or web address]

## 2018-08-27 NOTE — Progress Notes (Signed)
Palliative:  HPI: 53 yo male with PMH biventricular heart failure (EF 10-15%), HTN admitted 08/16/18 with AMS found down by a neighbor. He has been critically ill with cardiogenic shock, septic shock with citrobacter bacteremia, renal failure requiring CRRT (renal function has recovered). He has successfully weaned off inotropes and CRRT.   I met today at [name]'s bedside. He is alert and engaging in conversation. Encephalopathy is much improved. I attempted to elicit Kyle Conrad with great difficulty. [name] was pleasant but greatly lacked insight or awareness of his health condition. He does understand "I was close to death" but when considering his continued severe heart failure and poor prognosis he has poor understanding and exhibits tangential discussion. Noted some evidence of paranoia as he talks of "[place] and them" and people playing games to do him harm without providing any specifics. He talks of mistrust of people in general (including healthcare workers).   When I broach the subject of resuscitation and life support measures he is indecisive and tells me that he would not want this if the cause is "natural" but would want all measures if the cause not natural (again with thoughts of paranoia). His wishes remain unclear as he does not understand his underlying health conditions.   I am unfamiliar with [name] and I will follow up for further discussion. At current time I question his capacity for decision making. If this is his baseline he may warrant official capacity evaluation. I did attempt to ask him who his surrogate decision maker would be but he was unable to give me a direct answer and goes off subject.   Exam: Alert, orientation questionable. No distress.   Plan: - Continue [place] conversation. I fear obtaining any direct decisions from [name] will be very difficult.  - May consider capacity evaluation.   79 min  Vinie Sill, NP Palliative Medicine Team Pager #  [phone] (M-F 8a-5p) Team Phone # [phone] (Nights/Weekends)

## 2018-08-27 NOTE — Consult Note (Signed)
[facility] Nurse wound follow up Patient receiving care in [place].  Assisted with positioning client by NT. Wound type: Unstageable PIs to left buttock and left thigh Measurement: Left buttock measures 12 cm x 17 cm, is 100% darkened/reddish in color.  Left thigh measures 15 cm x 13 cm is also 100% darkened/reddish in color.  Neither wound has odor or induration.  Continue current plan of care--[place] Monitor the wound area(s) for worsening of condition such as: Signs/symptoms of infection,  Increase in size,  Development of or worsening of odor, Development of pain, or increased pain at the affected locations.  Notify the medical team if any of these develop. Val Riles, RN, MSN, CWOCN, CNS-BC, pager [phone]

## 2018-08-27 NOTE — Progress Notes (Signed)
Nutrition Follow-up  DOCUMENTATION CODES:   Non-severe (moderate) malnutrition in context of chronic illness  INTERVENTION:   Change tube feeding to bolus regimen: - Osmolite 1.5 cal 4 cans daily (total volume = 948 ml) - Pro-stat 30 ml BID  Tube feeding regimen provides 1622 kcal, 89 grams of protein, and 722 ml of H2O (77% of kcal needs, 81% of protein needs).  - Continue Ensure Enlive po BID, each supplement provides 350 kcal and 20 grams of protein  NUTRITION DIAGNOSIS:   Moderate Malnutrition related to chronic illness (CHF) as evidenced by moderate fat depletion, moderate muscle depletion, severe muscle depletion.  Ongoing, being addressed via TF and oral nutrition supplement  GOAL:   Patient will meet greater than or equal to 90% of their needs  Progressing  MONITOR:   PO intake, Supplement acceptance, Labs, I & O's, Weight trends, Skin, TF tolerance  REASON FOR ASSESSMENT:   Consult Enteral/tube feeding initiation and management  ASSESSMENT:   53 year old male who presented to the ED on 4/2 with AMS and weakness. PMH of CHF, HTN. Pt admitted with decompensated biventricular heart failure and shock.  PO intake improving somewhat. Will switch pt to bolus TF regimen and decrease overall volume to promote increased PO intake. Discussed plan with RN.  Discussed low phos with Heart Failure team who will address.  Weight up 10 lbs since last RD visit. Pt continues to diurese. Per RN edema assessment, pt with mild pitting generalized edema, non-pitting edema to BUE, mild pitting edema to BUE, and mild pitting edema to perineal and sacral areas.  Meal Completion: 25-100% x 4 meals  Medications reviewed and include: Ensure Enlive BID (pt accepting >90%), Protonix, Miralax, spironolactone, thiamine, torsemide, IV abx, magnesium sulfate 2 grams once  Labs reviewed: sodium 146 (H), BUN 48 (H), phosphorus 1.8 (L), hemoglobin 7.5 (L) CBG's: 110, 107, 116, 130, 142 x 24  hours  UOP: 4300 ml x 24 hours I/O's: -19.6  Diet Order:   Diet Order            Diet 2 gram sodium Room service appropriate? Yes; Fluid consistency: Thin  Diet effective now              EDUCATION NEEDS:   No education needs have been identified at this time  Skin:  Skin Assessment: Skin Integrity Issues: DTI: left buttocks, left thigh Other: non-pressure wound to scrotum, weeping from BLE  Last BM:  08/27/18 small type 5  Height:   Ht Readings from Last 1 Encounters:  08/23/18 6\' 1"  (1.854 m)    Weight:   Wt Readings from Last 1 Encounters:  08/27/18 93.4 kg    Ideal Body Weight:  80.9 kg  BMI:  Body mass index is 27.18 kg/m.  Estimated Nutritional Needs:   Kcal:  2100-2300  Protein:  110-125 grams  Fluid:  per MD    Gaynell Face, MS, RD, LDN Inpatient Clinical Dietitian Pager: [phone] Weekend/After Hours: [phone]

## 2018-08-28 DIAGNOSIS — I5082 Biventricular heart failure: Secondary | ICD-10-CM

## 2018-08-28 LAB — GLUCOSE, CAPILLARY
Glucose-Capillary: 121 mg/dL — ABNORMAL HIGH (ref 70–99)
Glucose-Capillary: 62 mg/dL — ABNORMAL LOW (ref 70–99)
Glucose-Capillary: 63 mg/dL — ABNORMAL LOW (ref 70–99)
Glucose-Capillary: 83 mg/dL (ref 70–99)
Glucose-Capillary: 90 mg/dL (ref 70–99)

## 2018-08-28 LAB — CBC WITH DIFFERENTIAL/PLATELET
Abs Immature Granulocytes: 0.03 10*3/uL (ref 0.00–0.07)
Basophils Absolute: 0 10*3/uL (ref 0.0–0.1)
Basophils Relative: 0 %
Eosinophils Absolute: 0.2 10*3/uL (ref 0.0–0.5)
Eosinophils Relative: 4 %
HCT: 23.9 % — ABNORMAL LOW (ref 39.0–52.0)
Hemoglobin: 7.3 g/dL — ABNORMAL LOW (ref 13.0–17.0)
Immature Granulocytes: 1 %
Lymphocytes Relative: 14 %
Lymphs Abs: 0.8 10*3/uL (ref 0.7–4.0)
MCH: 28 pg (ref 26.0–34.0)
MCHC: 30.5 g/dL (ref 30.0–36.0)
MCV: 91.6 fL (ref 80.0–100.0)
Monocytes Absolute: 0.9 10*3/uL (ref 0.1–1.0)
Monocytes Relative: 16 %
Neutro Abs: 3.7 10*3/uL (ref 1.7–7.7)
Neutrophils Relative %: 65 %
Platelets: 151 10*3/uL (ref 150–400)
RBC: 2.61 MIL/uL — ABNORMAL LOW (ref 4.22–5.81)
RDW: 18.2 % — ABNORMAL HIGH (ref 11.5–15.5)
WBC: 5.6 10*3/uL (ref 4.0–10.5)
nRBC: 0 % (ref 0.0–0.2)

## 2018-08-28 LAB — CULTURE, BLOOD (ROUTINE X 2)
Culture: NO GROWTH
Culture: NO GROWTH
Special Requests: ADEQUATE

## 2018-08-28 LAB — BASIC METABOLIC PANEL
Anion gap: 7 (ref 5–15)
BUN: 47 mg/dL — ABNORMAL HIGH (ref 6–20)
CO2: 34 mmol/L — ABNORMAL HIGH (ref 22–32)
Calcium: 7.9 mg/dL — ABNORMAL LOW (ref 8.9–10.3)
Chloride: 99 mmol/L (ref 98–111)
Creatinine, Ser: 1.01 mg/dL (ref 0.61–1.24)
GFR calc Af Amer: >60 mL/min (ref >60)
GFR calc non Af Amer: >60 mL/min (ref >60)
Glucose, Bld: 90 mg/dL (ref 70–99)
Potassium: 4.7 mmol/L (ref 3.5–5.1)
Sodium: 140 mmol/L (ref 135–145)

## 2018-08-28 LAB — COOXEMETRY PANEL
Carboxyhemoglobin: 2.2 % — ABNORMAL HIGH (ref 0.5–1.5)
Methemoglobin: 1.6 % — ABNORMAL HIGH (ref 0.0–1.5)
O2 Saturation: 69.6 %
Total hemoglobin: 7.8 g/dL — ABNORMAL LOW (ref 12.0–16.0)

## 2018-08-28 MED ORDER — PRO-STAT SUGAR FREE PO LIQD
30.0000 mL | Freq: Two times a day (BID) | ORAL | Status: DC
  Administered 2018-08-28 – 2018-09-20 (×45): 30 mL via ORAL
  Filled 2018-08-28 (×45): qty 30

## 2018-08-28 MED ORDER — ENSURE ENLIVE PO LIQD
237.0000 mL | Freq: Three times a day (TID) | ORAL | Status: DC
  Administered 2018-08-28 – 2018-09-20 (×60): 237 mL via ORAL

## 2018-08-28 MED ORDER — GERHARDT'S BUTT CREAM
TOPICAL_CREAM | Freq: Every day | CUTANEOUS | Status: DC
  Administered 2018-08-28 – 2018-09-20 (×21): via TOPICAL
  Filled 2018-08-28: qty 1

## 2018-08-28 NOTE — Progress Notes (Signed)
Nutrition Follow-up  RD working remotely.  DOCUMENTATION CODES:   Non-severe (moderate) malnutrition in context of chronic illness  INTERVENTION:   - d/c tube feeding orders  - Increase Ensure Enlive po to TID, each supplement provides 350 kcal and 20 grams of protein  - Add Pro-stat 30 ml po BID, each supplement provides 100 kcal and 15 grams of protein  NUTRITION DIAGNOSIS:   Moderate Malnutrition related to chronic illness (CHF) as evidenced by moderate fat depletion, moderate muscle depletion, severe muscle depletion.  Ongoing, being addressed via oral nutrition supplements  GOAL:   Patient will meet greater than or equal to 90% of their needs  Progressing  MONITOR:   PO intake, Supplement acceptance, Labs, I & O's, Weight trends, Skin, TF tolerance  REASON FOR ASSESSMENT:   Consult Enteral/tube feeding initiation and management  ASSESSMENT:   53 year old male who presented to the ED on 4/2 with AMS and weakness. PMH of CHF, HTN. Pt admitted with decompensated biventricular heart failure and shock.  Palliative care discussions regarding [place] are ongoing. Pt more alert and oriented today but palliative recommends considering capacity evaluation as unable to elicit any clear decisions on code status from pt. Therapies recommending SNF.  RD spoke with Heart Failure team regarding pt's improved PO intake and discussed need for Cortrak. Decision made to remove Cortrak and d/c tube feeds. RD will increase supplement regimen. Discussed with RN. Per RN, pt is drinking Ensure Enlive supplements as ordered.  Will also add Pro-stat to aid in meeting protein needs.  Attempted to speak with pt via phone call to room but pt did not answer.  Meal Completion: 75-100% x last 2 days  Medications reviewed and include: Ensure Enlive BID, Protonix, Miralax, spironolactone, thiamine, IV abx  Labs reviewed: hemoglobin 7.3 (L) CBG's: 63, 62, 121, 144, 146 x 24 hours  UOP: 3350 ml x  24 hours I/O's: -21.6 L since admit  Diet Order:   Diet Order            Diet 2 gram sodium Room service appropriate? Yes; Fluid consistency: Thin  Diet effective now              EDUCATION NEEDS:   No education needs have been identified at this time  Skin:  Skin Assessment: Skin Integrity Issues: DTI: left buttocks, left thigh Other: non-pressure wound to scrotum, weeping from BLE  Last BM:  08/27/18 small type 5  Height:   Ht Readings from Last 1 Encounters:  08/23/18 6\' 1"  (1.854 m)    Weight:   Wt Readings from Last 1 Encounters:  08/28/18 93.5 kg    Ideal Body Weight:  80.9 kg  BMI:  Body mass index is 27.2 kg/m.  Estimated Nutritional Needs:   Kcal:  2100-2300  Protein:  110-125 grams  Fluid:  per MD    Gaynell Face, MS, RD, LDN Inpatient Clinical Dietitian Pager: [phone] Weekend/After Hours: [phone]

## 2018-08-28 NOTE — Progress Notes (Signed)
Patient ID: Kyle Conrad, male   DOB: Sep 16, 1965, 53 y.o.   MRN: [id number]     Advanced Heart Failure Rounding Note  PCP-Cardiologist: No primary care provider on file.   Subjective:    Sitting up in chair. Continues to be alert but tangential conversation. Met will Palliative Care and unable to elicit any clear decisions on code status and they felt he may need Capacity evaluation  Denies CP or SOB. Torsemie cut back yesterday with low CVP. Still with > 3L out.   Co-ox 70% off dobutamine. Weight stable. CVP 5  Tube feeds ongoing, also eating.    PT recommend SNF   Objective:   Weight Range: 93.5 kg Body mass index is 27.2 kg/m.   Vital Signs:   Temp:  [97.6 F (36.4 C)-98.5 F (36.9 C)] 97.6 F (36.4 C) (04/14 0714) Pulse Rate:  [101-110] 108 (04/14 0848) Resp:  [15-17] 15 (04/14 0411) BP: (92-103)/(68-79) 103/79 (04/14 0714) SpO2:  [96 %-100 %] 98 % (04/14 0411) Weight:  [93.5 kg] 93.5 kg (04/14 0411) Last BM Date: 08/28/18  Weight change: Filed Weights   08/26/18 0300 08/27/18 0333 08/28/18 0411  Weight: 93 kg 93.4 kg 93.5 kg    Intake/Output:   Intake/Output Summary (Last 24 hours) at 08/28/2018 1009 Last data filed at 08/28/2018 0713 Gross per 24 hour  Intake 906.6 ml  Output 3350 ml  Net -2443.4 ml      Physical Exam    General: Chronically ill appearing. Sitting up in chair. . No resp difficulty HEENT: normal + temporal wasting + Cor-trax Neck: supple. no JVD. Carotids 2+ bilat; no bruits. No lymphadenopathy or thryomegaly appreciated. Cor: PMI laterally displaced. Mildly tachy No rubs, gallops or murmurs. Lungs: clear Abdomen: soft, nontender, nondistended. No hepatosplenomegaly. No bruits or masses. Good bowel sounds. Extremities: no cyanosis, clubbing, rash, tr  Edema + wounds Neuro: alert & orientedx3, cranial nerves grossly intact. moves all 4 extremities w/o difficulty. Affect pleasant   Telemetry   Sinus tachy 90-110 (personally  reviewed)  Labs    CBC Recent Labs    08/27/18 0524 08/28/18 0434  WBC 7.3 5.6  NEUTROABS 5.8 3.7  HGB 7.5* 7.3*  HCT 24.0* 23.9*  MCV 91.3 91.6  PLT 111* 371   Basic Metabolic Panel Recent Labs    08/26/18 0509 08/27/18 0524 08/28/18 0856  NA 145 146* 140  K 4.4 4.4 4.7  CL 104 104 99  CO2 34* 35* 34*  GLUCOSE 116* 113* 90  BUN 51* 48* 47*  CREATININE 0.94 0.97 1.01  CALCIUM 7.9* 7.7* 7.9*  MG 1.8 1.7  --   PHOS 2.2* 1.8*  --    Liver Function Tests Recent Labs    08/26/18 0509 08/27/18 0524  ALBUMIN 1.4* 1.4*   No results for input(s): LIPASE, AMYLASE in the last 72 hours. Cardiac Enzymes No results for input(s): CKTOTAL, CKMB, CKMBINDEX, TROPONINI in the last 72 hours.  BNP: BNP (last 3 results) Recent Labs    03/24/18 1435 06/05/18 1411 08/16/18 1611  BNP 2,471.9* 3,961.4* 2,433.3*    ProBNP (last 3 results) No results for input(s): PROBNP in the last 8760 hours.   D-Dimer No results for input(s): DDIMER in the last 72 hours. Hemoglobin A1C No results for input(s): HGBA1C in the last 72 hours. Fasting Lipid Panel No results for input(s): CHOL, HDL, LDLCALC, TRIG, CHOLHDL, LDLDIRECT in the last 72 hours. Thyroid Function Tests No results for input(s): TSH, T4TOTAL, T3FREE, THYROIDAB in the last 72  hours.  Invalid input(s): FREET3  Other results:   Imaging    No results found.   Medications:     Scheduled Medications: . chlorhexidine  15 mL Mouth Rinse BID  . Chlorhexidine Gluconate Cloth  6 each Topical Daily  . collagenase   Topical Daily  . digoxin  0.125 mg Oral Daily  . feeding supplement (ENSURE ENLIVE)  237 mL Oral BID BM  . feeding supplement (OSMOLITE 1.5 CAL)  237 mL Per Tube QID  . feeding supplement (PRO-STAT SUGAR FREE 64)  30 mL Per Tube BID  . heparin injection (subcutaneous)  5,000 Units Subcutaneous Q8H  . isosorbide-hydrALAZINE  0.5 tablet Oral TID  . mouth rinse  15 mL Mouth Rinse q12n4p  . pantoprazole  sodium  40 mg Per Tube Daily  . polyethylene glycol  17 g Oral Daily  . sodium chloride flush  10-40 mL Intracatheter Q12H  . spironolactone  12.5 mg Oral Daily  . thiamine  100 mg Per Tube Daily  . torsemide  20 mg Oral Daily    Infusions: . sodium chloride 250 mL (08/28/18 0014)  . sodium chloride Stopped (08/19/18 1159)  . cefTRIAXone (ROCEPHIN)  IV Stopped (08/27/18 1115)    PRN Medications: Place/Maintain arterial line **AND** sodium chloride, acetaminophen (TYLENOL) oral liquid 160 mg/5 mL, lip balm, ondansetron (ZOFRAN) IV, senna, sodium chloride flush   Assessment/Plan   1. 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  He is markedly volume overloaded with suspected mixed septic/cardiogenic shock.  Echo 4/3 with severe biventricular failure, LVEF 20-25% small effusion, moderate to severe MR and severe TR.  He is now off dobutamine. CVP 3 today on oral torsemide.  Ongoing good UOP.  BUN/creatinine stable off CVVH. Co-ox 70%.  - Continue Bidil 1/2 tab tid.  BP to low too titrate - Continue digoxin 0.125 daily.  - Continue spironolactone 12.5 daily.  - No b-blocker with recent shock. No ACE/ARB/ARN with recent AKI - CVP remains low. Continue Cut torsemide at 20 daily for now. Can increase as needed(was on 40 bid at home) - He is not going to be a candidate for advanced therapies.  LVAD likely not an option regardless with severe RV dysfunction and has had very poor compliance with medical therapy.  2. ID: Suspect component of septic shock, PCT 6.76.  Citrobacter freundii in blood initially. ?Source from lower leg/foot skin breakdown. Febrile again am 4/9.  Afebrile today with normal WBCs.  CXR with LLL atelectasis versus infiltrate. Repeat cultures NGTD.  - Antibiotics broadened again on 4/9, currently on vancomycin/cefepime.  -  Narrowed back to ceftriaxone 4/12 - Most recent cultures remain negative.  - Will pull Foley today 3. AKI: Concern for ATN in setting of shock.  Was down at home for an undetermined length of time.  Initially required CVVH, now off. CVP low again today, ongoing reasonable UOP likely post-ATN diuresis.  BUN/creatinine stable. - Continue torsemide 20 daily     4. Thrombocytopenia: Noted at admission, normal plts in the past.  Suspect related to sepsis (normal fibrinogen, no schistocytes in blood).  Transfused 1 unit plts 4/5 and again on 4/6.  Today, platelets up to 151K,No overt bleeding.  5. Hypoglyemia: Resolved.   6. Neuro: Confusion/delirium, likely in setting of critical illness/infection. Head CT 4/4 with no acute changes. Passed swallow study.  Aphonia resolved.  Confused today  but can be re-oriented.  Knows who I am but very tangential. 7. DVT prophylaxis: - On [place] heparin t 8. Vascular: ABIs normal.  Suspect skin breakdown on foot related to venous stasis and marked swelling initially.    9. FEN: severe protein calorie malnutrition. Continue tube feeds via Cortrack. He is eating as well.   10. LE wounds and sacral decub - WOC following  11. Hypomagnesemia  - Supped  12. Deconditioning: Severe.  Out of bed, PT/OT.  Will need SNF.    13. Anemia - hgb stable 7.3-7.5 range. May need 1u prior to D/C. Will recheck in am   Overall poor long-term prognosis.  He may recover from the current episode but he does not have good future options. Palliative care following. Remains full Code for now. Ready for SNF. I d/w SW personally.    Length of Stay: [place], MD  08/28/2018, 10:09 AM  Advanced Heart Failure Team Pager [phone] (M-F; 7a - 4p)  Please contact [facility] for night-coverage after hours (4p -7a ) and weekends on [email or web address]

## 2018-08-28 NOTE — TOC Progression Note (Signed)
Transition of Care [facility]) - Progression Note    Patient Details  Name: Kyle Conrad MRN: [id number] Date of Birth: 1966-02-13  Transition of Care [facility]) CM/SW [place], [place] Phone Number: 08/28/2018, 12:09 PM  Clinical Narrative:    CSW consulted [place] SNF placement. CSW visited with the patient to discuss PT recommendation of ST rehab at [facility].Patient reports he lives alone in a flat level apartment.  Patient was pleasant and agreed to the discharge plan, Patient states he wants to rehab and then go home. He wants to get better and make a "smooth transition home". Patient gave verbal consent to contact his relative [name] and his friend Dewaine Oats, however, CSW [place] unable to reach them by phone.   Patient states he is not familiar with any SNFs. CSW explained possible challenge of SNF placement with no insurance. CSW informed the patient he will be updated on SNF bed offers once available. Patient expressed appreciation for CSW assistance. Paient states no other questions or concerns at this time.   CSW will send referral but FL2 will be completed and faxed out after the patient's cortrak is removed.     Expected Discharge Plan: Skilled Nursing Facility Barriers to Discharge: SNF Pending payor source - LOG, SNF Pending bed offer  Expected Discharge Plan and Services Expected Discharge Plan: [place]                                   Social Determinants of Health (SDOH) Interventions    Readmission Risk Interventions No flowsheet data found.

## 2018-08-28 NOTE — Progress Notes (Deleted)
error 

## 2018-08-28 NOTE — Progress Notes (Signed)
Occupational Therapy Treatment Patient Details Name: Kyle Conrad MRN: [id number] DOB: May 24, 1965 Today's Date: 08/28/2018    History of present illness Pt is a 52 y.o. male admitted 08/16/18 with AMS after being found down by neighbor; treated for cardiogenic shock, septic shock with citrobacter bactermeia. Also with AKI; on CRRT 4/3-4/8. Has remained encephalopathic throughout admission. Head CT 4/4 with no acute changes. PMH includes biventricular HF, HTN.   OT comments  Patient cooperative today.  Following 1 step commands with increased consistency given increased time and multimodal cueing.  Completing bed mobility with min assist, simulated toilet transfers with min assist +2 assist, and grooming standing at sink with min assist (with mod cueing to problem solve and sequence needs at sink), and in room functional mobility with min assist +2 safety.  Progressing towards goals.  Will follow.    Follow Up Recommendations  SNF;Supervision/Assistance - 24 hour    Equipment Recommendations  Other (comment)(defer to next venue of care)    Recommendations for Other Services      Precautions / Restrictions Precautions Precautions: Fall Restrictions Weight Bearing Restrictions: No       Mobility Bed Mobility Overal bed mobility: Needs Assistance Bed Mobility: Supine to Sit     Supine to sit: Min assist     General bed mobility comments: min assist for trunk support to EOB, increased time and effort and maximal verbal cues for technique/sequencing (inimally attempting to exit on L side with rails up)  Transfers Overall transfer level: Needs assistance Equipment used: Rolling walker (2 wheeled) Transfers: Sit to/from Stand Sit to Stand: +2 physical assistance;+2 safety/equipment;Min assist         General transfer comment: cueing for hand placement and sequencing, assist to power up into standing     Balance Overall balance assessment: Needs assistance Sitting-balance  support: No upper extremity supported Sitting balance-[name]: Fair     Standing balance support: No upper extremity supported;During functional activity Standing balance-[name]: Poor Standing balance comment: able to complete grooming at sink with min guard while washing hands 0 hand support                           ADL either performed or assessed with clinical judgement   ADL Overall ADL's : Needs assistance/impaired     Grooming: Wash/dry hands;Minimal assistance;Standing Grooming Details (indicate cue type and reason): min guard for standing balance, min assist to problem solve and sequence task                  Toilet Transfer: Minimal assistance;Ambulation;RW;+2 for safety/equipment;+2 for physical assistance Toilet Transfer Details (indicate cue type and reason): simulated to recliner  Toileting- Clothing Manipulation and Hygiene: Total assistance;+2 for physical assistance;+2 for safety/equipment;Sit to/from stand Toileting - Clothing Manipulation Details (indicate cue type and reason): sit to stand from EOB with total assist for nurse tech to assist with peri care after incontient BM     Functional mobility during ADLs: Minimal assistance;+2 for safety/equipment;Rolling walker General ADL Comments: pt limited by cognition, generalized weakness      Vision       Perception     Praxis      Cognition Arousal/Alertness: Awake/alert Behavior During Therapy: Restless;Flat affect Overall Cognitive Status: No family/caregiver present to determine baseline cognitive functioning Area of Impairment: Attention;Memory;Following commands;Safety/judgement;Awareness;Problem solving                   Current Attention Level: Sustained  Memory: Decreased recall of precautions;Decreased short-term memory Following Commands: Follows one step commands inconsistently;Follows one step commands with increased time Safety/Judgement: Decreased awareness  of safety;Decreased awareness of deficits Awareness: Intellectual Problem Solving: Slow processing;Decreased initiation;Difficulty sequencing;Requires verbal cues General Comments: Pt presenting restless today, requires cueing to redirect but appropriate and agreeable given increased time to process and problem solve.  Able to follow commands with approx 50% accuracy, but requires multimodal cueing.         Exercises     Shoulder Instructions       General Comments VSS    Pertinent Vitals/ Pain       Pain Assessment: Faces Faces Pain Scale: No hurt  Home Living                                          Prior Functioning/Environment              Frequency  Min 2X/week        Progress Toward Goals  OT Goals(current goals can now be found in the care plan section)  Progress towards OT goals: Progressing toward goals  Acute Rehab OT Goals Patient Stated Goal: to get up to the chair  OT Goal Formulation: With patient Time For Goal Achievement: 09/05/18 Potential to Achieve Goals: [place] Discharge plan remains appropriate;Frequency remains appropriate    Co-evaluation                 AM-PAC OT "6 Clicks" Daily Activity     Outcome Measure   Help from another person eating meals?: A Little Help from another person taking care of personal grooming?: A Lot Help from another person toileting, which includes using toliet, bedpan, or urinal?: Total Help from another person bathing (including washing, rinsing, drying)?: A Lot Help from another person to put on and taking off regular upper body clothing?: A Lot Help from another person to put on and taking off regular lower body clothing?: A Lot 6 Click Score: 12    End of Session Equipment Utilized During Treatment: Rolling walker  OT Visit Diagnosis: Unsteadiness on feet (R26.81);Other abnormalities of gait and mobility (R26.89);Muscle weakness (generalized) (M62.81);Other symptoms and  signs involving cognitive function;Adult, failure to thrive (R62.7)   Activity Tolerance Patient tolerated treatment well   Patient Left in chair;with call bell/phone within reach;with chair alarm set   Nurse Communication Mobility status        Time: 2500-3704 OT Time Calculation (min): 29 min  Charges: OT General Charges $OT Visit: 1 Visit OT Treatments $Self Care/Home Management : 23-37 mins  Delight Stare, [place] Pager [phone] Office [phone]    Delight Stare 08/28/2018, 11:14 AM

## 2018-08-28 NOTE — Progress Notes (Signed)
Palliative:  I met again today with Mr. [name]. He continues to be very pleasant. Appears more oriented today but does exhibit delayed recall and repeats himself at times. Continues to lack good insight into his CHF trajectory and expectations. He does agree to SNF rehab placement with hopes of continued improvement and returning back to his home.   He does tell me how he wishes to reconnect with some of his family. I told him that his cousin, [name], was concerned for him as she was contacted when he was critically ill. He seems to identify [name] as his main contact person and support.   I am inclined to believe that he would desire DNR if he were to further decline from his heart failure based on my conversation with him yesterday where he indicated he would NOT want resuscitation and life support if his decline were from "natural causes." However, he lacks the insight to understand that this is very likely to occur in the near future and to put some of these decisions in place.   Exam: Alert, more oriented today. No distress. Pleasant. Poor insight.   Plan: - Transition to SNF rehab with outpatient palliative to continue [place] conversation.   13 min  Vinie Sill, NP Palliative Medicine Team Pager # [phone] (M-F 8a-5p) Team Phone # [phone] (Nights/Weekends)

## 2018-08-29 ENCOUNTER — Encounter (HOSPITAL_COMMUNITY): Payer: Self-pay

## 2018-08-29 DIAGNOSIS — Z515 Encounter for palliative care: Secondary | ICD-10-CM

## 2018-08-29 DIAGNOSIS — Z7189 Other specified counseling: Secondary | ICD-10-CM

## 2018-08-29 DIAGNOSIS — I5082 Biventricular heart failure: Secondary | ICD-10-CM

## 2018-08-29 LAB — COOXEMETRY PANEL
Carboxyhemoglobin: 2 % — ABNORMAL HIGH (ref 0.5–1.5)
Methemoglobin: 1.9 % — ABNORMAL HIGH (ref 0.0–1.5)
O2 Saturation: 66.8 %
Total hemoglobin: 7.5 g/dL — ABNORMAL LOW (ref 12.0–16.0)

## 2018-08-29 LAB — BASIC METABOLIC PANEL
Anion gap: 7 (ref 5–15)
BUN: 44 mg/dL — ABNORMAL HIGH (ref 6–20)
CO2: 32 mmol/L (ref 22–32)
Calcium: 7.9 mg/dL — ABNORMAL LOW (ref 8.9–10.3)
Chloride: 99 mmol/L (ref 98–111)
Creatinine, Ser: 0.87 mg/dL (ref 0.61–1.24)
GFR calc Af Amer: >60 mL/min (ref >60)
GFR calc non Af Amer: >60 mL/min (ref >60)
Glucose, Bld: 72 mg/dL (ref 70–99)
Potassium: 4.3 mmol/L (ref 3.5–5.1)
Sodium: 138 mmol/L (ref 135–145)

## 2018-08-29 LAB — CBC WITH DIFFERENTIAL/PLATELET
Abs Immature Granulocytes: 0.03 10*3/uL (ref 0.00–0.07)
Basophils Absolute: 0 10*3/uL (ref 0.0–0.1)
Basophils Relative: 0 %
Eosinophils Absolute: 0.2 10*3/uL (ref 0.0–0.5)
Eosinophils Relative: 4 %
HCT: 24.5 % — ABNORMAL LOW (ref 39.0–52.0)
Hemoglobin: 7.2 g/dL — ABNORMAL LOW (ref 13.0–17.0)
Immature Granulocytes: 1 %
Lymphocytes Relative: 16 %
Lymphs Abs: 0.8 10*3/uL (ref 0.7–4.0)
MCH: 27.4 pg (ref 26.0–34.0)
MCHC: 29.4 g/dL — ABNORMAL LOW (ref 30.0–36.0)
MCV: 93.2 fL (ref 80.0–100.0)
Monocytes Absolute: 0.9 10*3/uL (ref 0.1–1.0)
Monocytes Relative: 18 %
Neutro Abs: 3 10*3/uL (ref 1.7–7.7)
Neutrophils Relative %: 61 %
Platelets: 192 10*3/uL (ref 150–400)
RBC: 2.63 MIL/uL — ABNORMAL LOW (ref 4.22–5.81)
RDW: 19.5 % — ABNORMAL HIGH (ref 11.5–15.5)
WBC: 4.9 10*3/uL (ref 4.0–10.5)
nRBC: 0 % (ref 0.0–0.2)

## 2018-08-29 LAB — GLUCOSE, CAPILLARY
Glucose-Capillary: 103 mg/dL — ABNORMAL HIGH (ref 70–99)
Glucose-Capillary: 106 mg/dL — ABNORMAL HIGH (ref 70–99)
Glucose-Capillary: 63 mg/dL — ABNORMAL LOW (ref 70–99)
Glucose-Capillary: 82 mg/dL (ref 70–99)
Glucose-Capillary: 86 mg/dL (ref 70–99)
Glucose-Capillary: 99 mg/dL (ref 70–99)

## 2018-08-29 LAB — PREPARE RBC (CROSSMATCH)

## 2018-08-29 LAB — MAGNESIUM: Magnesium: 1.8 mg/dL (ref 1.7–2.4)

## 2018-08-29 MED ORDER — SODIUM CHLORIDE 0.9% IV SOLUTION
Freq: Once | INTRAVENOUS | Status: AC
  Administered 2018-08-29: 09:00:00 via INTRAVENOUS

## 2018-08-29 MED ORDER — MAGNESIUM SULFATE 2 GM/50ML IV SOLN
2.0000 g | Freq: Once | INTRAVENOUS | Status: AC
  Administered 2018-08-29: 11:00:00 2 g via INTRAVENOUS
  Filled 2018-08-29: qty 50

## 2018-08-29 NOTE — Progress Notes (Signed)
. Patient ID: Kyle Conrad, male   DOB: 1965/12/25, 53 y.o.   MRN: [id number]     Advanced Heart Failure Rounding Note  PCP-Cardiologist: No primary care provider on file.   Subjective:    Cortrak removed 4/14. Appetite poor.   Has recurrent hypoglycemia and hypothermia today. Denies CP or SOB. Says it is hard because there is a "lot coming at him right now"   CVP 4    Objective:   Weight Range: 114.8 kg Body mass index is 33.38 kg/m.   Vital Signs:   Temp:  [97.4 F (36.3 C)-98.1 F (36.7 C)] 97.5 F (36.4 C) (04/15 0557) Pulse Rate:  [91-101] 91 (04/15 0720) Resp:  [16] 16 (04/15 0720) BP: (98-115)/(72-75) 115/75 (04/15 0720) SpO2:  [100 %] 100 % (04/15 0720) Weight:  [114.8 kg] 114.8 kg (04/15 0650) Last BM Date: 08/28/18  Weight change: Filed Weights   08/27/18 0333 08/28/18 0411 08/29/18 0650  Weight: 93.4 kg 93.5 kg 114.8 kg    Intake/Output:   Intake/Output Summary (Last 24 hours) at 08/29/2018 0901 Last data filed at 08/28/2018 2134 Gross per 24 hour  Intake 1156 ml  Output -  Net 1156 ml      Physical Exam    General:  Lying in bed weak appearing. NAD HEENT: normal + temporal wasting Neck: supple. no JVD. Carotids 2+ bilat; no bruits. No lymphadenopathy or thryomegaly appreciated. Cor: PMI laterally displaced. Regular rate & rhythm. +s3 Lungs: clear Abdomen: soft, nontender, nondistended. No hepatosplenomegaly. No bruits or masses. Good bowel sounds. Extremities: no cyanosis, clubbing, rash, tr edema + wounds Neuro: alert & oriented nonfocal  Moves all 4     Telemetry   Sinus tachy 90-110 (personally reviewed)  Labs    CBC Recent Labs    08/28/18 0434 08/29/18 0445  WBC 5.6 4.9  NEUTROABS 3.7 3.0  HGB 7.3* 7.2*  HCT 23.9* 24.5*  MCV 91.6 93.2  PLT 151 038   Basic Metabolic Panel Recent Labs    08/27/18 0524 08/28/18 0856 08/29/18 0600  NA 146* 140 138  K 4.4 4.7 4.3  CL 104 99 99  CO2 35* 34* 32  GLUCOSE 113* 90 72   BUN 48* 47* 44*  CREATININE 0.97 1.01 0.87  CALCIUM 7.7* 7.9* 7.9*  MG 1.7  --  1.8  PHOS 1.8*  --   --    Liver Function Tests Recent Labs    08/27/18 0524  ALBUMIN 1.4*   No results for input(s): LIPASE, AMYLASE in the last 72 hours. Cardiac Enzymes No results for input(s): CKTOTAL, CKMB, CKMBINDEX, TROPONINI in the last 72 hours.  BNP: BNP (last 3 results) Recent Labs    03/24/18 1435 06/05/18 1411 08/16/18 1611  BNP 2,471.9* 3,961.4* 2,433.3*    ProBNP (last 3 results) No results for input(s): PROBNP in the last 8760 hours.   D-Dimer No results for input(s): DDIMER in the last 72 hours. Hemoglobin A1C No results for input(s): HGBA1C in the last 72 hours. Fasting Lipid Panel No results for input(s): CHOL, HDL, LDLCALC, TRIG, CHOLHDL, LDLDIRECT in the last 72 hours. Thyroid Function Tests No results for input(s): TSH, T4TOTAL, T3FREE, THYROIDAB in the last 72 hours.  Invalid input(s): FREET3  Other results:   Imaging    No results found.   Medications:     Scheduled Medications: . chlorhexidine  15 mL Mouth Rinse BID  . Chlorhexidine Gluconate Cloth  6 each Topical Daily  . collagenase   Topical Daily  .  digoxin  0.125 mg Oral Daily  . feeding supplement (ENSURE ENLIVE)  237 mL Oral TID BM  . feeding supplement (PRO-STAT SUGAR FREE 64)  30 mL Oral BID  . Gerhardt's butt cream   Topical Daily  . heparin injection (subcutaneous)  5,000 Units Subcutaneous Q8H  . isosorbide-hydrALAZINE  0.5 tablet Oral TID  . mouth rinse  15 mL Mouth Rinse q12n4p  . pantoprazole sodium  40 mg Per Tube Daily  . polyethylene glycol  17 g Oral Daily  . sodium chloride flush  10-40 mL Intracatheter Q12H  . spironolactone  12.5 mg Oral Daily  . thiamine  100 mg Per Tube Daily  . torsemide  20 mg Oral Daily    Infusions: . sodium chloride 250 mL (08/28/18 0014)  . sodium chloride Stopped (08/19/18 1159)  . cefTRIAXone (ROCEPHIN)  IV 2 g (08/28/18 1041)  . magnesium  sulfate 1 - 4 g bolus IVPB      PRN Medications: Place/Maintain arterial line **AND** sodium chloride, acetaminophen (TYLENOL) oral liquid 160 mg/5 mL, lip balm, ondansetron (ZOFRAN) IV, senna, sodium chloride flush   Assessment/Plan   1. 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  He is markedly volume overloaded with suspected mixed septic/cardiogenic shock.  Echo 4/3 with severe biventricular failure, LVEF 20-25% small effusion, moderate to severe MR and severe TR.  He is now off dobutamine. CVP 3 today on oral torsemide.  Ongoing good UOP.  BUN/creatinine stable off CVVH. - CO-OX stable at 67% .  - Continue Bidil 1/2 tab tid.  BP to low too titrate - Continue digoxin 0.125 daily.  - Continue spironolactone 12.5 daily.  - No b-blocker with recent shock. No ACE/ARB/ARN with recent AKI - Continue torsemide at 20 daily.  Can increase as needed(was on 40 bid at home) - He is not going to be a candidate for advanced therapies.  LVAD likely not an option regardless with severe RV dysfunction and has had very poor compliance with medical therapy.  2. ID: Suspect component of septic shock, PCT 6.76.  Citrobacter freundii in blood initially. ?Source from lower leg/foot skin breakdown. Febrile again am 4/9.  Afebrile today with normal WBCs.  CXR with LLL atelectasis versus infiltrate. Repeat cultures NGTD.  - Antibiotics broadened again on 4/9, currently on vancomycin/cefepime.  - Narrowed back to ceftriaxone 4/12. Stop antibiotics today. Discussed with pharmacy.  - Most recent cultures remain negative.  3. AKI: Concern for ATN in setting of shock.  Was down at home for an undetermined length of time.  Initially required CVVH, now off. Resolved.  - Continue torsemide 20 daily     4. Thrombocytopenia: Noted at admission, normal plts in the past.   Suspect related to sepsis (normal fibrinogen, no schistocytes in blood).  Transfused 1 unit plts 4/5 and again on 4/6.   - Resolved.   5. Hypoglyemia: Resolved.   6. Neuro: Confusion/delirium, likely in setting of critical illness/infection. Head CT 4/4 with no acute changes. Passed swallow study.  [place]phonia resolved.  7. DVT prophylaxis: - On [place] heparin  8. Vascular: ABIs normal.  Suspect skin breakdown on foot related to venous stasis and marked swelling initially.    9. FEN: severe protein calorie malnutrition. Cortrak pulled yesterday but po intake very low. Will ask Nutrition to see gain  10. LE wounds and sacral decub - WOC following  11. Hypomagnesemia  -Mag 1.8.  - Give 2 grams mag.  12. Deconditioning: Severe.  Out of bed, PT/OT.  Will need SNF.    13. Anemia - hgb stable 7.3-7.5 range.  -Hgb 7.2. with no obvious source of bleeding. Would transfuse 1UPRBC now.   He remains very tenuous and I think the only real option is Hospice. He will likely not thrive at SNF. Will d/w Palliative.   Length of Stay: 13  Glori Bickers, MD  2:38 PM   Advanced Heart Failure Team Pager [phone] (M-F; 7a - 4p)  Please contact [facility] for night-coverage after hours (4p -7a ) and weekends on [email or web address]

## 2018-08-29 NOTE — Progress Notes (Addendum)
Hypoglycemic Event  CBG: 63 Treatment: crackers  With peanut butter and orange juice Symptoms: none noted  Follow-up CBG: Time:0800 CBG Result83  Possible Reasons for Event: not eating enough Comments/MD notified:pt now stable ., NP [place] via Amnion.    Kyle Conrad

## 2018-08-29 NOTE — Progress Notes (Signed)
Pt  Started bear hugger as well as blood warmer , will continue to monitor , care continues.

## 2018-08-29 NOTE — Progress Notes (Addendum)
Pt had low temperature readingT 93 . Along with low blood pressures , commenced blood transfusion 89/75, is to start a bear hugger for warmth and use of blood warmer . Pt resting in bed at present , monitoring v/s esp temp. Spoke with dr , care continues.

## 2018-08-29 NOTE — Progress Notes (Signed)
Physical Therapy Treatment Patient Details Name: Kyle Conrad MRN: [id number] DOB: July 21, 1965 Today's Date: 08/29/2018    History of Present Illness Pt is a 53 y.o. male admitted 08/16/18 with AMS after being found down by neighbor; treated for cardiogenic shock, septic shock with citrobacter bactermeia. Also with AKI; on CRRT 4/3-4/8. Has remained encephalopathic throughout admission. Head CT 4/4 with no acute changes. PMH includes biventricular HF, HTN.    PT Comments    Limited today by lower body temps.  BP soft, but high enough for light activity and not symptomatic.    Follow Up Recommendations  SNF;Supervision/Assistance - 24 hour     Equipment Recommendations  Other (comment)(TBA)    Recommendations for Other Services       Precautions / Restrictions      Mobility  Bed Mobility Overal bed mobility: Needs Assistance Bed Mobility: Supine to Sit;Sit to Supine     Supine to sit: Min assist Sit to supine: Min assist   General bed mobility comments: needed a little trunk support and legs assist, but nothing significant  Transfers Overall transfer level: Needs assistance   Transfers: Sit to/from Stand Sit to Stand: Mod assist         General transfer comment: limited to stand by RN due to too low body temperature   Ambulation/Gait                 Stairs             Wheelchair Mobility    Modified Rankin (Stroke Patients Only)       Balance Overall balance assessment: Needs assistance Sitting-balance support: No upper extremity supported Sitting balance-[name]: Fair                                      Cognition Arousal/Alertness: Awake/alert Behavior During Therapy: Flat affect Overall Cognitive Status: (NT formally,  initiated movement and followed commands)                                        Exercises      General Comments General comments (skin integrity, edema, etc.): temp 94.1,  vss otherwise with BP 98/79 soft but asymptomatic      Pertinent Vitals/Pain Pain Assessment: Faces Faces Pain Scale: No hurt    Home Living                      Prior Function            PT Goals (current goals can now be found in the care plan section) Acute Rehab PT Goals Patient Stated Goal: to get up to the chair  PT Goal Formulation: With patient Time For Goal Achievement: 09/05/18 Potential to Achieve Goals: Fair Progress towards PT goals: Not progressing toward goals - comment(unable to progress due to low body temps)    Frequency    Min 2X/week      PT Plan Current plan remains appropriate    Co-evaluation              AM-PAC PT "6 Clicks" Mobility   Outcome Measure  Help needed turning from your back to your side while in a flat bed without using bedrails?: A Lot Help needed moving from lying on your back to sitting on the side  of a flat bed without using bedrails?: A Lot Help needed moving to and from a bed to a chair (including a wheelchair)?: A Lot Help needed standing up from a chair using your arms (e.g., wheelchair or bedside chair)?: A Lot Help needed to walk in hospital room?: A Lot Help needed climbing 3-5 steps with a railing? : A Lot 6 Click Score: 12    End of Session   Activity Tolerance: Patient tolerated treatment well;Other (comment)(limited by body temp by RN once already up.) Patient left: in bed;with call bell/phone within reach;Other (comment)(back under the bear hugger.) Nurse Communication: Mobility status PT Visit Diagnosis: Other abnormalities of gait and mobility (R26.89);Muscle weakness (generalized) (M62.81)     Time: 9735-3299 PT Time Calculation (min) (ACUTE ONLY): 13 min  Charges:  $Therapeutic Activity: 8-22 mins                     08/29/2018  Donnella Sham, PT Acute Rehabilitation Services [phone]  (pager) [phone]  (office)   [name] 08/29/2018, 3:38 PM

## 2018-08-30 LAB — GLUCOSE, CAPILLARY
Glucose-Capillary: 106 mg/dL — ABNORMAL HIGH (ref 70–99)
Glucose-Capillary: 111 mg/dL — ABNORMAL HIGH (ref 70–99)
Glucose-Capillary: 115 mg/dL — ABNORMAL HIGH (ref 70–99)
Glucose-Capillary: 117 mg/dL — ABNORMAL HIGH (ref 70–99)
Glucose-Capillary: 62 mg/dL — ABNORMAL LOW (ref 70–99)
Glucose-Capillary: 87 mg/dL (ref 70–99)
Glucose-Capillary: 93 mg/dL (ref 70–99)

## 2018-08-30 LAB — CBC WITH DIFFERENTIAL/PLATELET
Abs Immature Granulocytes: 0 10*3/uL (ref 0.00–0.07)
Basophils Absolute: 0.1 10*3/uL (ref 0.0–0.1)
Basophils Relative: 2 %
Eosinophils Absolute: 0 10*3/uL (ref 0.0–0.5)
Eosinophils Relative: 0 %
HCT: 24.2 % — ABNORMAL LOW (ref 39.0–52.0)
Hemoglobin: 7.6 g/dL — ABNORMAL LOW (ref 13.0–17.0)
Lymphocytes Relative: 3 %
Lymphs Abs: 0.2 10*3/uL — ABNORMAL LOW (ref 0.7–4.0)
MCH: 27.9 pg (ref 26.0–34.0)
MCHC: 31.4 g/dL (ref 30.0–36.0)
MCV: 89 fL (ref 80.0–100.0)
Monocytes Absolute: 0.4 10*3/uL (ref 0.1–1.0)
Monocytes Relative: 8 %
Neutro Abs: 4.7 10*3/uL (ref 1.7–7.7)
Neutrophils Relative %: 87 %
Platelets: 241 10*3/uL (ref 150–400)
RBC: 2.72 MIL/uL — ABNORMAL LOW (ref 4.22–5.81)
RDW: 17.2 % — ABNORMAL HIGH (ref 11.5–15.5)
WBC: 5.4 10*3/uL (ref 4.0–10.5)
nRBC: 0 /100 WBC
nRBC: 0.4 % — ABNORMAL HIGH (ref 0.0–0.2)

## 2018-08-30 LAB — BASIC METABOLIC PANEL
Anion gap: 8 (ref 5–15)
BUN: 40 mg/dL — ABNORMAL HIGH (ref 6–20)
CO2: 28 mmol/L (ref 22–32)
Calcium: 7.9 mg/dL — ABNORMAL LOW (ref 8.9–10.3)
Chloride: 99 mmol/L (ref 98–111)
Creatinine, Ser: 0.92 mg/dL (ref 0.61–1.24)
GFR calc Af Amer: >60 mL/min (ref >60)
GFR calc non Af Amer: >60 mL/min (ref >60)
Glucose, Bld: 75 mg/dL (ref 70–99)
Potassium: 4.6 mmol/L (ref 3.5–5.1)
Sodium: 135 mmol/L (ref 135–145)

## 2018-08-30 LAB — BPAM RBC
Blood Product Expiration Date: 202005012359
ISSUE DATE / TIME: 202004151151
Unit Type and Rh: 7300

## 2018-08-30 LAB — DIGOXIN LEVEL: Digoxin Level: 0.3 ng/mL — ABNORMAL LOW (ref 0.8–2.0)

## 2018-08-30 LAB — TYPE AND SCREEN
ABO/RH(D): B POS
Antibody Screen: NEGATIVE
Unit division: 0

## 2018-08-30 LAB — COOXEMETRY PANEL
Carboxyhemoglobin: 1.6 % — ABNORMAL HIGH (ref 0.5–1.5)
Methemoglobin: 1.5 % (ref 0.0–1.5)
O2 Saturation: 66.9 %
Total hemoglobin: 12.1 g/dL (ref 12.0–16.0)

## 2018-08-30 MED ORDER — TORSEMIDE 20 MG PO TABS
40.0000 mg | ORAL_TABLET | Freq: Every day | ORAL | Status: DC
  Administered 2018-08-31 – 2018-09-02 (×3): 40 mg via ORAL
  Filled 2018-08-30 (×3): qty 2

## 2018-08-30 NOTE — Progress Notes (Signed)
Hypoglycemic Event  CBG: 62  Treatment: 8 oz juice/soda  Symptoms: None  Follow-up CBG: Time: 0420 CBG Result: 93  Possible Reasons for Event: Unknown     Kyle Conrad

## 2018-08-30 NOTE — Progress Notes (Signed)
Palliative:  I met again today with Kyle Conrad. He is much unchanged but appears more fatigued today. He is lying in bed. He has eaten 50-60% of meals today per nursing. He says that the food does not taste good. He continues to have delayed recall and thoughts at times but does recall things we discussed in past conversations.   When I broach the subject of GOC and "what ifs" regarding his heart worsening and his health worsening he continues to struggle for insight. He talks of his goal of going to the gym and "exercising." When I ask if he is not able to do this physically he tells me that he will do something else and when I further inquire he tells me like bicycling.   He knows that his heart failure is not going to improve but he cannot picture a time when this worsens and that he will not be able to get back to his normal life at home. This makes any decision or goals of care difficult to determine with his lack of insight. He has no close friends or relatives and the cousin/aunt that was contacted when he was delirious wanted him to make his own decisions. There appears to be nobody that we can pull into the conversation to assist with [name].   Hospice facility is not an option as he will not agree to full comfort care as he foresees a return home and recovery. Hospice at home would be a good option IF he had some social support but even hospice resources at home are limited d/t COVID. The only progress in [name] that I am able to foresee is that if he were to further decline I could discuss with his cousin and aunt about his expressed wishes to avoid resuscitation if his decline were from "natural causes." Difficult situation.   Exam: Alert, mostly oriented but poor insight. Calm, cooperative. Delayed recall and thought processes.   Plan: - Difficult situation with no good options unfortunately. See above description.   65 min  Vinie Sill, NP Palliative Medicine Team Pager # [phone]  (M-F 8a-5p) Team Phone # [phone] (Nights/Weekends)

## 2018-08-30 NOTE — Progress Notes (Addendum)
Nutrition Follow-up  RD working remotely.  DOCUMENTATION CODES:   Non-severe (moderate) malnutrition in context of chronic illness  INTERVENTION:   - Recommend liberalizing diet to REGULAR as pt has been reporting food does not taste good  - Continue Ensure Enlive po TID, each supplement provides 350 kcal and 20 grams of protein  - Continue Pro-stat 30 ml po BID, each supplement provides 100 kcal and 15 grams of protein  - Add Magic Cup TID with meals, each supplement provides 290 kcal and 9 grams of protein  - Took pt's food preferences and also added gravy with meats at meals  - Encourage adequate PO intake  NUTRITION DIAGNOSIS:   Moderate Malnutrition related to chronic illness (CHF) as evidenced by moderate fat depletion, moderate muscle depletion, severe muscle depletion.  Ongoing, being addressed via oral nutrition supplements  GOAL:   Patient will meet greater than or equal to 90% of their needs  Progressing  MONITOR:   PO intake, Supplement acceptance, Labs, Skin, I & O's, Weight trends  REASON FOR ASSESSMENT:   Consult Assessment of nutrition requirement/status  ASSESSMENT:   53 year old male who presented to the ED on 4/2 with AMS and weakness. PMH of CHF, HTN. Pt admitted with decompensated biventricular heart failure and shock.  4/03 - CRRT initiated 4/04 - SLP evaluation with recommendations for regular diet and thin liquids 4/06 - Cortrak placed and TF initiated 4/08 - CRRT off 4/13 - transitioned to bolus TF 4/14 - Cortrak removed  Spoke with RN via phone call. Per RN, pt completed 60% of breakfast this AM and completed 100% of 1 Pro-stat and 100% of 1 Ensure Enlive oral nutrition supplement thus far today.  For breakfast this morning, pt received orange juice, milk, coffee, a banana, pancakes, oatmeal, and sausage. Meal completion of 60% correlates with intake of approximately 558 kcal and 21 grams of protein (100% completion of meal would have  been 930 kcal and 35 grams of protein).  Per RD discussion with RN on 4/14, pt was consuming 100% of Ensure Enlive supplements as ordered which was twice daily.  Reviewed meal completion records. Meal completion on 4/13 documented as 80% and 100% for 2 meals. Meal completion on 4/14 documented as 75% and 75% for 2 meals. No meal completion documented on 4/15.  Spoke with RN again later in the afternoon who reports pt consumed 50% of sandwich and 100% of sherbet from lunch tray (meal completion documented as 75%) and that she is going to offer him pudding and applesauce after wound care.  RD was able to reach pt via phone call. Pt reports that his appetite is "slow" but that it is getting better. Pt reports that it takes him longer to eat "certain meals," specifically ones that are dry ("I don't take to dry food as well"). RD offered to have meats come with gravy and pt agrees. Pt also shares that he does not eat pork. RD called [facility] and had pork listed in pt's profile as a dislike so that it will not be sent to him in the future. [facility] will also make note to send meats with gravy.  Discussed importance of adequate PO intake with pt as it relates to healing, maintaining lean muscle mass, and preventing weight loss. Pt expresses understanding.  Pt reports that he is drinking the Ensure supplements and had 2 or 3 yesterday. Pt states he would like to have another this afternoon.  RD will add Magic Cup TID with meals.  Per weight documentation, weight down 80 lbs overnight. Suspect this is inaccurate, possible bed scale error.  Medications reviewed and include: Ensure Enlive TID, Pro-stat BID, Protonix, Miralax, spironolactone, thiamine  Labs reviewed: hemoglobin 7.6 (L) CBG's: 87, 117, 93, 62 (treated with 8 oz of juice/soda), 82, 106 x 24 hours  UOP: 3600 ml x 24 hours I/O's: -22.1 L since admit  Diet Order:   Diet Order            Diet 2 gram sodium Room service appropriate? Yes; Fluid  consistency: Thin  Diet effective now              EDUCATION NEEDS:   Education needs have been addressed  Skin:  Skin Assessment: Skin Integrity Issues: DTI: left buttocks, left thigh Other: non-pressure wound to scrotum, weeping from BLE  Last BM:  08/30/18 type 6  Height:   Ht Readings from Last 1 Encounters:  08/23/18 6\' 1"  (1.854 m)    Weight:   Wt Readings from Last 1 Encounters:  08/30/18 78.7 kg    Ideal Body Weight:  80.9 kg  BMI:  Body mass index is 22.89 kg/m.  Estimated Nutritional Needs:   Kcal:  2100-2300  Protein:  110-125 grams  Fluid:  per MD    Gaynell Face, MS, RD, LDN Inpatient Clinical Dietitian Pager: [phone] Weekend/After Hours: [phone]

## 2018-08-30 NOTE — Progress Notes (Signed)
IV team arrived to check on clotted central line  Wound care and dressing change completed to all areas of breakdown sacrum, knees, left leg, left hip

## 2018-08-30 NOTE — Progress Notes (Signed)
Physical Therapy Treatment Patient Details Name: Kyle Conrad MRN: [id number] DOB: 08-16-1965 Today's Date: 08/30/2018    History of Present Illness Pt is a 53 y.o. male admitted 08/16/18 with AMS after being found down by neighbor; treated for cardiogenic shock, septic shock with citrobacter bactermeia. Also with AKI; on CRRT 4/3-4/8. Has remained encephalopathic throughout admission. Head CT 4/4 with no acute changes. PMH includes biventricular HF, HTN.    PT Comments    Pt lethargic on arrival, but agreeable to getting OOB as he aroused.  Emphasis today on transitioning to the EOB, sit to stand and ambulating in the hall with the RW.  On the return, but had stool in his dressing.  Pt stood x4-5 min for dressing cover change.    Follow Up Recommendations  SNF;Supervision/Assistance - 24 hour     Equipment Recommendations  Other (comment)(TBA)    Recommendations for Other Services       Precautions / Restrictions Precautions Precautions: Fall    Mobility  Bed Mobility Overal bed mobility: Needs Assistance Bed Mobility: Supine to Sit;Sit to Supine     Supine to sit: Min assist Sit to supine: Min assist   General bed mobility comments: minimal support at trunk or legs with pt doing the majority of the work in a slower fashion.  Transfers Overall transfer level: Needs assistance Equipment used: Rolling walker (2 wheeled) Transfers: Sit to/from Stand Sit to Stand: Mod assist         General transfer comment: cues for hand placement and assist to mostly come forward.  Ambulation/Gait Ambulation/Gait assistance: Min assist;+2 safety/equipment Gait Distance (Feet): 90 Feet(with 1 rest break) Assistive device: Rolling walker (2 wheeled) Gait Pattern/deviations: Step-through pattern Gait velocity: slower Gait velocity interpretation: <1.31 ft/sec, indicative of household ambulator General Gait Details: still slow and guarded, less unsteady and better positioning in  the RW.  Still needing stability assist.   Stairs             Wheelchair Mobility    Modified Rankin (Stroke Patients Only)       Balance Overall balance assessment: Needs assistance   Sitting balance-[name]: Fair       Standing balance-[name]: Poor                              Cognition Arousal/Alertness: Awake/alert Behavior During Therapy: Flat affect Overall Cognitive Status: No family/caregiver present to determine baseline cognitive functioning                     Current Attention Level: Sustained   Following Commands: Follows one step commands with increased time Safety/Judgement: Decreased awareness of safety;Decreased awareness of deficits Awareness: Intellectual Problem Solving: Slow processing;Decreased initiation;Difficulty sequencing        Exercises      General Comments General comments (skin integrity, edema, etc.): vss,  HR creaping up toward 120 bpm      Pertinent Vitals/Pain Pain Assessment: Faces Faces Pain Scale: No hurt    Home Living                      Prior Function            PT Goals (current goals can now be found in the care plan section) Acute Rehab PT Goals Patient Stated Goal: get better at his walking PT Goal Formulation: With patient Time For Goal Achievement: 09/05/18 Potential to Achieve Goals: Fair  Progress towards PT goals: Progressing toward goals    Frequency    Min 2X/week      PT Plan Current plan remains appropriate    Co-evaluation              AM-PAC PT "6 Clicks" Mobility   Outcome Measure  Help needed turning from your back to your side while in a flat bed without using bedrails?: A Little Help needed moving from lying on your back to sitting on the side of a flat bed without using bedrails?: A Little Help needed moving to and from a bed to a chair (including a wheelchair)?: A Lot Help needed standing up from a chair using your arms (e.g.,  wheelchair or bedside chair)?: A Lot Help needed to walk in hospital room?: A Little Help needed climbing 3-5 steps with a railing? : A Lot 6 Click Score: 15    End of Session   Activity Tolerance: Patient tolerated treatment well;Patient limited by fatigue Patient left: in bed;with call bell/phone within reach;Other (comment) Nurse Communication: Mobility status PT Visit Diagnosis: Other abnormalities of gait and mobility (R26.89);Muscle weakness (generalized) (M62.81)     Time: 0300-9233 PT Time Calculation (min) (ACUTE ONLY): 24 min  Charges:  $Gait Training: 8-22 mins $Therapeutic Activity: 8-22 mins                     08/30/2018  Donnella Sham, PT [place] [phone]  (pager) [phone]  (office)   Tessie Fass [place] 08/30/2018, 4:49 PM

## 2018-08-30 NOTE — Progress Notes (Signed)
IV team unable to unclot or flush white port of triple lumen CVC, CVP line and NS at [place] in other ports

## 2018-08-30 NOTE — Progress Notes (Signed)
Dr Haroldine Laws informed in person one port on the CVC is clotted

## 2018-08-30 NOTE — TOC Progression Note (Signed)
Transition of Care [facility]) - Progression Note    Patient Details  Name: Kyle Conrad MRN: [id number] Date of Birth: June 12, 1965  Transition of Care [facility]) CM/SW [place], [place] Phone Number: [phone] 08/30/2018, 3:20 PM  Clinical Narrative:    Patient has no insurance( & will need LOG) and no bed offers. CSW contacted [facility], [facility] , they have no availability. [facility] not admitting any patients. CSW reached out to [facility]- they will review and follow up with CSW. CSW will continue to follow and assist with discharge planning.    Expected Discharge Plan: Skilled Nursing Facility Barriers to Discharge: SNF Pending payor source - LOG, SNF Pending bed offer  Expected Discharge Plan and Services Expected Discharge Plan: [place]                                   Social Determinants of Health (SDOH) Interventions    Readmission Risk Interventions Readmission Risk Prevention Plan 08/28/2018  Transportation Screening Complete  PCP or Specialist Appt within 3-5 Days Complete  HRI or [place] Complete  Social Work Consult for [place] Planning/Counseling Complete  Palliative Care Screening Not Applicable  Medication Review Press photographer) Complete  Some recent data might be hidden

## 2018-08-30 NOTE — Progress Notes (Signed)
Report from Sylvan Cheese RN, assume care of patient

## 2018-08-30 NOTE — Progress Notes (Signed)
. Patient ID: Kyle Conrad, male   DOB: May 09, 1966, 53 y.o.   MRN: [id number]     Advanced Heart Failure Rounding Note  PCP-Cardiologist: No primary care provider on file.   Subjective:    Yesterday he received 1UPRBCs for hgb 7.2. Todays hgb is 7.6   Off dobutamine co-ox 67%. Says he feels much better today. Denies CP or SOB. More clear.   CVP 7.    Objective:   Weight Range: 78.7 kg Body mass index is 22.89 kg/m.   Vital Signs:   Temp:  [93.5 F (34.2 C)-99.9 F (37.7 C)] 97.6 F (36.4 C) (04/16 0753) Pulse Rate:  [80-107] 98 (04/16 0323) Resp:  [13-26] 16 (04/16 0753) BP: (89-105)/(55-75) 105/73 (04/16 0323) SpO2:  [95 %-100 %] 100 % (04/16 0753) Weight:  [78.7 kg] 78.7 kg (04/16 0323) Last BM Date: 08/29/18  Weight change: Filed Weights   08/28/18 0411 08/29/18 0650 08/30/18 0323  Weight: 93.5 kg 114.8 kg 78.7 kg    Intake/Output:   Intake/Output Summary (Last 24 hours) at 08/30/2018 0916 Last data filed at 08/30/2018 0341 Gross per 24 hour  Intake 1870 ml  Output 3600 ml  Net -1730 ml      Physical Exam   General:   Sitting up in chair  No resp difficulty HEENT: normal Neck: supple. CVP 7 Carotids 2+ bilat; no bruits. No lymphadenopathy or thryomegaly appreciated. Cor: PMI laterally displaced. Regular rate & rhythm. No rubs, gallops or murmurs. Lungs: clear Abdomen: soft, nontender, nondistended. No hepatosplenomegaly. No bruits or masses. Good bowel sounds. Extremities: no cyanosis, clubbing, rash, no edema + wounds  Neuro: alert & orientedx3, cranial nerves grossly intact. moves all 4 extremities w/o difficulty. Affect pleasant   Telemetry   NSR 90-100 Personally reviewed    Labs    CBC Recent Labs    08/29/18 0445 08/30/18 0305  WBC 4.9 5.4  NEUTROABS 3.0 4.7  HGB 7.2* 7.6*  HCT 24.5* 24.2*  MCV 93.2 89.0  PLT 192 347   Basic Metabolic Panel Recent Labs    08/29/18 0600 08/30/18 0305  NA 138 135  K 4.3 4.6  CL 99 99  CO2  32 28  GLUCOSE 72 75  BUN 44* 40*  CREATININE 0.87 0.92  CALCIUM 7.9* 7.9*  MG 1.8  --    Liver Function Tests No results for input(s): AST, ALT, ALKPHOS, BILITOT, PROT, ALBUMIN in the last 72 hours. No results for input(s): LIPASE, AMYLASE in the last 72 hours. Cardiac Enzymes No results for input(s): CKTOTAL, CKMB, CKMBINDEX, TROPONINI in the last 72 hours.  BNP: BNP (last 3 results) Recent Labs    03/24/18 1435 06/05/18 1411 08/16/18 1611  BNP 2,471.9* 3,961.4* 2,433.3*    ProBNP (last 3 results) No results for input(s): PROBNP in the last 8760 hours.   D-Dimer No results for input(s): DDIMER in the last 72 hours. Hemoglobin A1C No results for input(s): HGBA1C in the last 72 hours. Fasting Lipid Panel No results for input(s): CHOL, HDL, LDLCALC, TRIG, CHOLHDL, LDLDIRECT in the last 72 hours. Thyroid Function Tests No results for input(s): TSH, T4TOTAL, T3FREE, THYROIDAB in the last 72 hours.  Invalid input(s): FREET3  Other results:   Imaging    No results found.   Medications:     Scheduled Medications: . chlorhexidine  15 mL Mouth Rinse BID  . Chlorhexidine Gluconate Cloth  6 each Topical Daily  . collagenase   Topical Daily  . digoxin  0.125 mg Oral Daily  .  feeding supplement (ENSURE ENLIVE)  237 mL Oral TID BM  . feeding supplement (PRO-STAT SUGAR FREE 64)  30 mL Oral BID  . Gerhardt's butt cream   Topical Daily  . heparin injection (subcutaneous)  5,000 Units Subcutaneous Q8H  . isosorbide-hydrALAZINE  0.5 tablet Oral TID  . mouth rinse  15 mL Mouth Rinse q12n4p  . pantoprazole sodium  40 mg Per Tube Daily  . polyethylene glycol  17 g Oral Daily  . sodium chloride flush  10-40 mL Intracatheter Q12H  . spironolactone  12.5 mg Oral Daily  . thiamine  100 mg Per Tube Daily  . torsemide  20 mg Oral Daily    Infusions: . sodium chloride 250 mL (08/28/18 0014)  . sodium chloride Stopped (08/19/18 1159)    PRN Medications: Place/Maintain  arterial line **AND** sodium chloride, acetaminophen (TYLENOL) oral liquid 160 mg/5 mL, lip balm, ondansetron (ZOFRAN) IV, senna, sodium chloride flush   Assessment/Plan   1. 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  He is markedly volume overloaded with suspected mixed septic/cardiogenic shock.  Echo 4/3 with severe biventricular failure, LVEF 20-25% small effusion, moderate to severe MR and severe TR.  He is now off dobutamine. Ongoing good UOP.  BUN/creatinine stable off CVVH. - Continue Bidil 1/2 tab tid.  BP to low too titrate - Continue digoxin 0.125 daily. Level 0.3 today - Continue spironolactone 12.5 daily.  - No b-blocker with recent shock. No ACE/ARB/ARN with recent AKI - CVP up a bit to 7. Increase torsemide to 40 daily starting tomorrow.(was on 40 bid at home) - He is not going to be a candidate for advanced therapies.  LVAD likely not an option regardless with severe RV dysfunction and has had very poor compliance with medical therapy.  2. ID: Suspect component of septic shock, PCT 6.76.  Citrobacter freundii in blood initially. ?Source from lower leg/foot skin breakdown. Febrile again am 4/9.  Afebrile today with normal WBCs.  CXR with LLL atelectasis versus infiltrate. Repeat cultures NGTD.  - Antibiotics broadened again on 4/9, currently on vancomycin/cefepime.  - Narrowed back to ceftriaxone 4/12. Stopped 4/15 Discussed with pharmacy.  - Most recent cultures remain negative.  3. AKI: Concern for ATN in setting of shock.  Was down at home for an undetermined length of time.  Initially required CVVH, now off. Resolved.  - Increase torsemide 40 daily     4. Thrombocytopenia: Noted at admission, normal plts in the past.  Suspect related to sepsis (normal fibrinogen, no schistocytes in blood).  Transfused 1 unit plts 4/5 and again  on 4/6.   - Resolved.   5. Hypoglyemia: Resolved.   6. Neuro: Confusion/delirium, likely in setting of critical illness/infection. Head CT 4/4 with no acute changes. Passed swallow study.  Aphonia resolved.  7. DVT prophylaxis: - On [place] heparin  8. Vascular: ABIs normal.  Suspect skin breakdown on foot related to venous stasis and marked swelling initially.    9. FEN:  - severe protein calorie malnutrition. Received short term tube feeds with Cor-trak which was removed 4/14. Now tolerating po diet bt intake poor. Will d/w Nutrition 10. LE wounds and sacral decub - WOC following  11. Hypomagnesemia  Received 2 grams mag on 4/15.  12. Deconditioning: Severe.  Out of bed, PT/OT.  Will need SNF - see below.    13. Anemia - hgb  stable 7.3-7.5 range.  -Received 1 unit PRBCs on 08/30/18.  14. Disposition - Prognosis quite poor.  - Now agrees to SNF placement but complicated by lack of insurance. Palliative Care also discussing Hospice options.   Length of Stay: 14  Glori Bickers, MD  10:18 AM    Advanced Heart Failure Team Pager [phone] (M-F; 7a - 4p)  Please contact [facility] for night-coverage after hours (4p -7a ) and weekends on [email or web address]

## 2018-08-30 NOTE — NC FL2 (Signed)
[place] MEDICAID FL2 LEVEL OF CARE SCREENING TOOL     IDENTIFICATION  Patient Name: Kyle Conrad Birthdate: 1965-10-30 Sex: male Admission Date (Current Location): 08/16/2018  [facility] and [place] Number:  Psychologist, counselling and Address:  [facility] [place], [place] [address] [place], [place] [address]      Provider Number: [id number]  Attending Physician Name and Address:  Larey Dresser, MD  Relative Name and Phone Number:  Marita [place]; relative; [phone]    Current Level of Care: SNF Recommended Level of Care: [place] Prior Approval Number:    Date Approved/Denied:   PASRR Number: [id number]  Discharge Plan: SNF    Current Diagnoses: Patient Active Problem List   Diagnosis Date Noted  . Biventricular heart failure ([place])   . Malnutrition of moderate degree 08/23/2018  . AKI (acute kidney injury) ([place])   . Goals of care, counseling/discussion   . Palliative care by specialist   . Cardiogenic shock ([place]) 08/16/2018  . Incarcerated left inguinal hernia 03/24/2018  . Hyponatremia 03/24/2018  . Other cirrhosis of liver ([place]) 03/24/2018  . Indirect inguinal hernia   . SBO (small bowel obstruction) ([place])   . Pressure injury of skin 03/18/2018  . Acute hypoxemic respiratory failure ([place]) 03/11/2018  . Chest pain 03/11/2018  . Hyperbilirubinemia 03/11/2018  . Acute exacerbation of CHF (congestive heart failure) ([place]) 03/10/2018  . HTN (hypertension) 08/10/2015  . Hypoalbuminemia 05/19/2015  . Anasarca   . Acute congestive heart failure ([place]) 04/22/2015  . Chronic anemia 04/22/2015  . Obstructive sleep apnea 12/15/2008  . FATIGUE / MALAISE 10/23/2008  . DYSPNEA 10/23/2008  . Chronic systolic CHF (congestive heart failure) ([place]) 09/25/2008  . Overweight 09/18/2008  . VENTRICULAR TACHYCARDIA 09/18/2008    Orientation RESPIRATION BLADDER Height & Weight     Self, Situation, Place  Normal Incontinent, External  catheter Weight: 173 lb 8 oz (78.7 kg) Height:  6\' 1"  (185.4 cm)  BEHAVIORAL SYMPTOMS/MOOD NEUROLOGICAL BOWEL NUTRITION STATUS      Incontinent Diet(2 gram sodium restriction; thin liquids)  AMBULATORY STATUS COMMUNICATION OF NEEDS Skin   Extensive Assist Verbally Skin abrasions, Other (Comment)(MASD & deep tissue injury on left buttocks and thighs with ABD & gauze; wound on scrotum with dressing; weeping on legs with abd & gauze)                       Personal Care Assistance Level of Assistance  Feeding, Bathing, Dressing Bathing Assistance: Maximum assistance Feeding assistance: Limited assistance Dressing Assistance: Maximum assistance     Functional Limitations Info  Sight, Hearing, Speech Sight Info: Adequate Hearing Info: Adequate Speech Info: Adequate    SPECIAL CARE FACTORS FREQUENCY  PT (By licensed PT), OT (By licensed OT)     PT Frequency: 5x week OT Frequency: 5x week            Contractures Contractures Info: Not present    Additional Factors Info  Allergies, Code Status Code Status Info: Full Code Allergies Info: LISINOPRIL, PENICILLINS            Current Medications (08/30/2018):  This is the current hospital active medication list Current Facility-Administered Medications  Medication Dose Route Frequency Provider Last Rate Last Dose  . 0.9 %  sodium chloride infusion  250 mL Intravenous Continuous Larey Dresser, MD 10 mL/hr at 08/28/18 0014 250 mL at 08/28/18 0014  . 0.9 %  sodium chloride infusion   Intra-arterial PRN Larey Dresser,  MD   Stopped at 08/19/18 1159  . acetaminophen (TYLENOL) solution 650 mg  650 mg Per Tube Q4H PRN Larey Dresser, MD   650 mg at 08/23/18 0655  . chlorhexidine (PERIDEX) 0.12 % solution 15 mL  15 mL Mouth Rinse BID Larey Dresser, MD   15 mL at 08/30/18 1012  . Chlorhexidine Gluconate Cloth 2 % PADS 6 each  6 each Topical Daily Larey Dresser, MD   6 each at 08/30/18 1322  . collagenase (SANTYL) ointment    Topical Daily Larey Dresser, MD      . digoxin Fonnie Birkenhead) tablet 0.125 mg  0.125 mg Oral Daily Larey Dresser, MD   0.125 mg at 08/30/18 1000  . feeding supplement (ENSURE ENLIVE) (ENSURE ENLIVE) liquid 237 mL  237 mL Oral TID BM Larey Dresser, MD   237 mL at 08/30/18 1419  . feeding supplement (PRO-STAT SUGAR FREE 64) liquid 30 mL  30 mL Oral BID Larey Dresser, MD   30 mL at 08/30/18 0957  . Gerhardt's butt cream   Topical Daily Philis Pique, NP      . heparin injection 5,000 Units  5,000 Units Subcutaneous Q8H Larey Dresser, MD   5,000 Units at 08/30/18 1423  . isosorbide-hydrALAZINE (BIDIL) 20-37.5 MG per tablet 0.5 tablet  0.5 tablet Oral TID Larey Dresser, MD   0.5 tablet at 08/30/18 0959  . lip balm (BLISTEX) ointment   Topical PRN Larey Dresser, MD      . MEDLINE mouth rinse  15 mL Mouth Rinse q12n4p Larey Dresser, MD   15 mL at 08/30/18 1323  . ondansetron (ZOFRAN) injection 4 mg  4 mg Intravenous Q6H PRN Larey Dresser, MD      . pantoprazole sodium (PROTONIX) 40 mg/20 mL oral suspension 40 mg  40 mg Per Tube Daily Larey Dresser, MD   40 mg at 08/30/18 0958  . polyethylene glycol (MIRALAX / GLYCOLAX) packet 17 g  17 g Oral Daily Larey Dresser, MD   17 g at 08/30/18 0958  . senna (SENOKOT) tablet 8.6 mg  1 tablet Oral Daily PRN Larey Dresser, MD      . sodium chloride flush (NS) 0.9 % injection 10-40 mL  10-40 mL Intracatheter Q12H Larey Dresser, MD   10 mL at 08/29/18 2103  . sodium chloride flush (NS) 0.9 % injection 10-40 mL  10-40 mL Intracatheter PRN Larey Dresser, MD      . spironolactone (ALDACTONE) tablet 12.5 mg  12.5 mg Oral Daily Larey Dresser, MD   12.5 mg at 08/30/18 1000  . thiamine (VITAMIN B-1) tablet 100 mg  100 mg Per Tube Daily Larey Dresser, MD   100 mg at 08/30/18 1000  . [START ON 08/31/2018] torsemide (DEMADEX) tablet 40 mg  40 mg Oral Daily Bensimhon, Shaune Pascal, MD         Discharge Medications: Please see  discharge summary for a list of discharge medications.  Relevant Imaging Results:  Relevant Lab Results:   Additional Information [id number] [place], [place]

## 2018-08-31 LAB — GLUCOSE, CAPILLARY
Glucose-Capillary: 98 mg/dL (ref 70–99)
Glucose-Capillary: 50 mg/dL — ABNORMAL LOW (ref 70–99)
Glucose-Capillary: 71 mg/dL (ref 70–99)
Glucose-Capillary: 97 mg/dL (ref 70–99)
Glucose-Capillary: 88 mg/dL (ref 70–99)
Glucose-Capillary: 103 mg/dL — ABNORMAL HIGH (ref 70–99)
Glucose-Capillary: 98 mg/dL (ref 70–99)

## 2018-08-31 LAB — CBC WITH DIFFERENTIAL/PLATELET
Abs Immature Granulocytes: 0.09 10*3/uL — ABNORMAL HIGH (ref 0.00–0.07)
Basophils Absolute: 0 10*3/uL (ref 0.0–0.1)
Basophils Relative: 1 %
Eosinophils Absolute: 0.2 10*3/uL (ref 0.0–0.5)
Eosinophils Relative: 3 %
HCT: 25.3 % — ABNORMAL LOW (ref 39.0–52.0)
Hemoglobin: 8 g/dL — ABNORMAL LOW (ref 13.0–17.0)
Immature Granulocytes: 2 %
Lymphocytes Relative: 22 %
Lymphs Abs: 1.2 10*3/uL (ref 0.7–4.0)
MCH: 28.6 pg (ref 26.0–34.0)
MCHC: 31.6 g/dL (ref 30.0–36.0)
MCV: 90.4 fL (ref 80.0–100.0)
Monocytes Absolute: 0.7 10*3/uL (ref 0.1–1.0)
Monocytes Relative: 13 %
Neutro Abs: 3.1 10*3/uL (ref 1.7–7.7)
Neutrophils Relative %: 59 %
Platelets: 287 10*3/uL (ref 150–400)
RBC: 2.8 MIL/uL — ABNORMAL LOW (ref 4.22–5.81)
RDW: 17.1 % — ABNORMAL HIGH (ref 11.5–15.5)
WBC: 5.3 10*3/uL (ref 4.0–10.5)
nRBC: 0.6 % — ABNORMAL HIGH (ref 0.0–0.2)

## 2018-08-31 LAB — BASIC METABOLIC PANEL
Anion gap: 3 — ABNORMAL LOW (ref 5–15)
BUN: 34 mg/dL — ABNORMAL HIGH (ref 6–20)
CO2: 33 mmol/L — ABNORMAL HIGH (ref 22–32)
Calcium: 8.3 mg/dL — ABNORMAL LOW (ref 8.9–10.3)
Chloride: 99 mmol/L (ref 98–111)
Creatinine, Ser: 0.78 mg/dL (ref 0.61–1.24)
GFR calc Af Amer: >60 mL/min (ref >60)
GFR calc non Af Amer: >60 mL/min (ref >60)
Glucose, Bld: 72 mg/dL (ref 70–99)
Potassium: 5 mmol/L (ref 3.5–5.1)
Sodium: 135 mmol/L (ref 135–145)

## 2018-08-31 LAB — COOXEMETRY PANEL
Carboxyhemoglobin: 1.4 % (ref 0.5–1.5)
Methemoglobin: 1.6 % — ABNORMAL HIGH (ref 0.0–1.5)
O2 Saturation: 71.8 %
Total hemoglobin: 7.9 g/dL — ABNORMAL LOW (ref 12.0–16.0)

## 2018-08-31 LAB — MAGNESIUM: Magnesium: 1.9 mg/dL (ref 1.7–2.4)

## 2018-08-31 MED ORDER — PANTOPRAZOLE SODIUM 40 MG PO TBEC
40.0000 mg | DELAYED_RELEASE_TABLET | Freq: Every day | ORAL | Status: DC
  Administered 2018-08-31 – 2018-09-20 (×21): 40 mg via ORAL
  Filled 2018-08-31 (×20): qty 1

## 2018-08-31 NOTE — Progress Notes (Signed)
. Patient ID: Kyle Conrad, male   DOB: 03-Apr-1966, 53 y.o.   MRN: [id number]     Advanced Heart Failure Rounding Note  PCP-Cardiologist: No primary care provider on file.   Subjective:    CO-OX 72%.  Yesterday torsemide was increased to 40 mg daily. CVP 7-9. Urine output increased Weight unchanged.    Feel weak. Denies SOB, orthopnea or PND. Appetite ok.    Objective:   Weight Range: 78.7 kg Body mass index is 22.9 kg/m.   Vital Signs:   Temp:  [97.5 F (36.4 C)-97.6 F (36.4 C)] 97.6 F (36.4 C) (04/17 0806) Pulse Rate:  [87-96] 96 (04/17 0806) Resp:  [13-20] 15 (04/17 0806) BP: (100-109)/(73-96) 109/76 (04/17 0806) SpO2:  [96 %-100 %] 97 % (04/17 0806) Weight:  [78.7 kg] 78.7 kg (04/17 0544) Last BM Date: 08/30/18  Weight change: Filed Weights   08/29/18 0650 08/30/18 0323 08/31/18 0544  Weight: 114.8 kg 78.7 kg 78.7 kg    Intake/Output:   Intake/Output Summary (Last 24 hours) at 08/31/2018 0841 Last data filed at 08/31/2018 0600 Gross per 24 hour  Intake 1307 ml  Output 2475 ml  Net -1168 ml      Physical Exam   CVP 7-9  General:  Lying in bed NAD  HEENT: normal + temporal wasting  Neck: supple. JVP 6-7 . Carotids 2+ bilat; no bruits. No lymphadenopathy or thryomegaly appreciated. Cor: PMI laterally displaced. Regular rate & rhythm. No rubs, gallops or murmurs. Lungs: clear Abdomen: soft, nontender, nondistended. No hepatosplenomegaly. No bruits or masses. Good bowel sounds. Extremities: no cyanosis, clubbing, rash, edema multiple skin wounds and sacral decubiti Neuro: alert & orientedx3, cranial nerves grossly intact. moves all 4 extremities w/o difficulty. Affect tangential    Telemetry   NSR 90s Personally reviewed   Labs    CBC Recent Labs    08/30/18 0305 08/31/18 0530  WBC 5.4 5.3  NEUTROABS 4.7 3.1  HGB 7.6* 8.0*  HCT 24.2* 25.3*  MCV 89.0 90.4  PLT 241 010   Basic Metabolic Panel Recent Labs    08/29/18 0600 08/30/18 0305  08/31/18 0530  NA 138 135 135  K 4.3 4.6 5.0  CL 99 99 99  CO2 32 28 33*  GLUCOSE 72 75 72  BUN 44* 40* 34*  CREATININE 0.87 0.92 0.78  CALCIUM 7.9* 7.9* 8.3*  MG 1.8  --  1.9   Liver Function Tests No results for input(s): AST, ALT, ALKPHOS, BILITOT, PROT, ALBUMIN in the last 72 hours. No results for input(s): LIPASE, AMYLASE in the last 72 hours. Cardiac Enzymes No results for input(s): CKTOTAL, CKMB, CKMBINDEX, TROPONINI in the last 72 hours.  BNP: BNP (last 3 results) Recent Labs    03/24/18 1435 06/05/18 1411 08/16/18 1611  BNP 2,471.9* 3,961.4* 2,433.3*    ProBNP (last 3 results) No results for input(s): PROBNP in the last 8760 hours.   D-Dimer No results for input(s): DDIMER in the last 72 hours. Hemoglobin A1C No results for input(s): HGBA1C in the last 72 hours. Fasting Lipid Panel No results for input(s): CHOL, HDL, LDLCALC, TRIG, CHOLHDL, LDLDIRECT in the last 72 hours. Thyroid Function Tests No results for input(s): TSH, T4TOTAL, T3FREE, THYROIDAB in the last 72 hours.  Invalid input(s): FREET3  Other results:   Imaging    No results found.   Medications:     Scheduled Medications: . Chlorhexidine Gluconate Cloth  6 each Topical Daily  . collagenase   Topical Daily  . digoxin  0.125 mg Oral Daily  . feeding supplement (ENSURE ENLIVE)  237 mL Oral TID BM  . feeding supplement (PRO-STAT SUGAR FREE 64)  30 mL Oral BID  . Gerhardt's butt cream   Topical Daily  . heparin injection (subcutaneous)  5,000 Units Subcutaneous Q8H  . isosorbide-hydrALAZINE  0.5 tablet Oral TID  . mouth rinse  15 mL Mouth Rinse q12n4p  . pantoprazole sodium  40 mg Per Tube Daily  . polyethylene glycol  17 g Oral Daily  . sodium chloride flush  10-40 mL Intracatheter Q12H  . spironolactone  12.5 mg Oral Daily  . thiamine  100 mg Per Tube Daily  . torsemide  40 mg Oral Daily    Infusions: . sodium chloride 250 mL (08/28/18 0014)  . sodium chloride 10 mL/hr at  08/31/18 0600    PRN Medications: Place/Maintain arterial line **AND** sodium chloride, acetaminophen (TYLENOL) oral liquid 160 mg/5 mL, lip balm, ondansetron (ZOFRAN) IV, senna, sodium chloride flush   Assessment/Plan   1. 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  He is markedly volume overloaded with suspected mixed septic/cardiogenic shock.  Echo 4/3 with severe biventricular failure, LVEF 20-25% small effusion, moderate to severe MR and severe TR.  He is now off dobutamine.   BUN/creatinine stable off CVVH. - CVP 7-9  continue torsemide 40 mg daily. Prior to admit he was on torsemide 40 mg twice a day. Can increase as needed  - Continue Bidil 1/2 tab tid.  BP to low too titrate - Continue digoxin 0.125 daily. Level 0.3 today - Continue spironolactone 12.5 daily.  - No b-blocker with recent shock. No ACE/ARB/ARN with recent AKI - He is not going to be a candidate for advanced therapies.  LVAD likely not an option regardless with severe RV dysfunction and has had very poor compliance with medical therapy.  2. ID: Suspect component of septic shock, PCT 6.76.  Citrobacter freundii in blood initially. ?Source from lower leg/foot skin breakdown. Febrile again am 4/9.  Afebrile today with normal WBCs.  CXR with LLL atelectasis versus infiltrate. Repeat cultures NGTD.  - Antibiotics broadened again on 4/9, currently on vancomycin/cefepime.  - Narrowed back to ceftriaxone 4/12. Stopped 4/15 Discussed with pharmacy.  - Most recent cultures remain negative.  3. AKI: Concern for ATN in setting of shock.  Was down at home for an undetermined length of time.  Initially required CVVH, now off. Resolved.  - Continue torsemide 40 daily     4. Thrombocytopenia: Noted at admission, normal plts in the past.  Suspect related to sepsis (normal fibrinogen, no  schistocytes in blood).  Transfused 1 unit plts 4/5 and again on 4/6.   - Resolved.   5. Hypoglyemia: Resolved.   6. Neuro: Confusion/delirium, likely in setting of critical illness/infection. Head CT 4/4 with no acute changes. Passed swallow study.  Aphonia resolved.  7. DVT prophylaxis: - On [place] heparin  8. Vascular: ABIs normal.  Suspect skin breakdown on foot related to venous stasis and marked swelling initially.    9. FEN:  - severe protein calorie malnutrition. Received short term tube feeds with Cor-trak which was removed 4/14. Appetite poor - will reconsult dietary for calorie count  10. LE wounds and sacral decub -Re consulted [facility] for d/c recommendations.  11. Hypomagnesemia  Received 2 grams mag on 4/15.  Mag 1.9 today. Give 2 grams  mag. 12. Deconditioning: Severe.  Out of bed, PT/OT.  Will need SNF - see below.    13. Anemia --Received 1 unit PRBCs on 08/30/18. Hgb stable today 8.  14. Disposition - Prognosis quite poor. He is ready for SNF placement when bed available.   - Now agrees to SNF placement but complicated by lack of insurance. Palliative Care also discussing Hospice options.     Length of Stay: 15  Glori Bickers, MD  3:20 PM  Advanced Heart Failure Team Pager [phone] (M-F; 7a - 4p)  Please contact [facility] for night-coverage after hours (4p -7a ) and weekends on [email or web address]

## 2018-08-31 NOTE — Progress Notes (Signed)
PT at bedside, patient up to the chair to eat lunch, stool covering dressing on bottom, replaced, wound care to be done after eating lunch if supplies have come to the unit

## 2018-08-31 NOTE — Progress Notes (Signed)
OT Cancellation Note  Patient Details Name: Giovoni Bunch MRN: [id number] DOB: 05-12-1966   Cancelled Treatment:    Reason Eval/Treat Not Completed: Fatigue/lethargy limiting ability to participate. Will continue to follow.  Malka So 08/31/2018, 3:13 PM  [name], OTR/L Acute Rehabilitation Services Pager: [phone] Office: [phone]

## 2018-08-31 NOTE — Progress Notes (Addendum)
Report from Sylvan Cheese and her orientee

## 2018-08-31 NOTE — Discharge Summary (Addendum)
Advanced Heart Failure Discharge Note  Discharge Summary   Patient ID: Kyle Conrad MRN: [id number], DOB/AGE: [birth year] 53 y.o. Admit date: 08/16/2018 D/C date:    09/20/2018   Primary Discharge Diagnoses:  1. A/C Biventricular HF 2. ID 3. AKI 4. Thrombocytopenia 5. Hypoglycemia 6. Neuro 7. DVT prophylaxis 8. Vascular ABIs 9. FEN 10. LE wounds and sacral decub 11. Hypomagnesium 12. Deconditioning 13. Anemia  Hospital Course:  Quindon Denker is a 53 y.o. 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   He was admitted to [facility] after his neighbor found him on his floor confused. He was hypoglycemic, hypothermic, and hypotensive on arrival. Required pressors for BP. Head CT was negative. He was admitted to the ICU for mixed cardiogenic/septic shock.   Blood cultures were positive and procalcitonin was elevated. Source thought to be from leg wound. Treated with antibiotics and repeat blood cultures were negative. He became febrile again on 4/9 and antibiotics briefly broadened again to Vanc and Cefepime, finished antibiotics on 4/12. Wounds to BLE and sacrum managed by WOC RN. ABI's checked and were normal.   Echo this admission showed EF 20-25% with severe biventricular failure. He initially required CVVHD with AKI and massive volume overload. Dobutamine slowly weaned off with stable co-ox. He transitioned to torsemide 20 mg MWF for discharge. HF meds optimized. BB was not added with recent shock. Kept off ACE/ARB/ARNI with recent AKI. Renal function recovered and was normal by day of discharge.  He also had thrombocytopenia, requiring platelet transfusions x 2. He also required blood transfusion on 4/15. He had no overt bleeding. Thrombocytopenia resolved and hemoglobin stabilized prior to discharge.   Nutrition consulted for poor intake and recommended liberalizing diet in addition to supplements. Palliative also consulted and recommended hospice  consideration, but he refused. PT/OT followed and recommended SNF for discharge. He will need to continue topical wound care per SNF protocol for wounds. COVID 19 test was negative on 09/20/18.   He will be discharge to SNF [facility] in [place] .   Marland Kitchen Discharge Weight:  Discharge Vitals: Blood pressure 121/75, pulse 98, temperature 98.2 F (36.8 C), temperature source Oral, resp. rate 14, height 6\' 1"  (1.854 m), weight 64.7 kg, SpO2 100 %.  Labs: Lab Results  Component Value Date   WBC 6.8 09/20/2018   HGB 8.8 (L) 09/20/2018   HCT 28.0 (L) 09/20/2018   MCV 95.2 09/20/2018   PLT 440 (H) 09/20/2018    Recent Labs  Lab 09/20/18 0530  NA 132*  K 5.2*  CL 95*  CO2 27  BUN 35*  CREATININE 1.02  CALCIUM 9.1  GLUCOSE 101*   Lab Results  Component Value Date   CHOL  09/08/2008    148        ATP III CLASSIFICATION:  <200     mg/dL   Desirable  200-239  mg/dL   Borderline High  >=240    mg/dL   High          HDL 32 (L) 09/08/2008   LDLCALC  09/08/2008    96        Total Cholesterol/HDL:CHD Risk Coronary Heart Disease Risk Table                     Men   Women  1/2 Average Risk   3.4   3.3  Average Risk       5.0   4.4  2 X Average Risk  9.6   7.1  3 X Average Risk  23.4   11.0        Use the calculated Patient Ratio above and the CHD Risk Table to determine the patient's CHD Risk.        ATP III CLASSIFICATION (LDL):  <100     mg/dL   Optimal  100-129  mg/dL   Near or Above                    Optimal  130-159  mg/dL   Borderline  160-189  mg/dL   High  >190     mg/dL   Very High   TRIG 100 09/08/2008   BNP (last 3 results) Recent Labs    03/24/18 1435 06/05/18 1411 08/16/18 1611  BNP 2,471.9* 3,961.4* 2,433.3*    ProBNP (last 3 results) No results for input(s): PROBNP in the last 8760 hours.   Diagnostic Studies/Procedures   Echo 08/17/2018:  1. The left ventricle has severely reduced systolic function, with an ejection fraction of 20-25%. The cavity  size was severely dilated. Left ventricular diastolic function could not be evaluated. There is right ventricular pressure overload. Left  ventrical global hypokinesis without regional wall motion abnormalities.  2. The right ventricle has severely reduced systolic function. The cavity was severely enlarged. There is no increase in right ventricular wall thickness. Right ventricular systolic pressure is mildly elevated with an estimated pressure of 47.7 mmHg.  3. Left atrial size was moderately dilated.  4. Right atrial size was severely dilated.  5. Small pericardial effusion.  6. The pericardial effusion is circumferential.  7. The mitral valve is grossly normal. Mitral valve regurgitation is moderate to severe by color flow Doppler. The MR jet is eccentric posteriorly directed.  8. The tricuspid valve is grossly normal. Tricuspid valve regurgitation is severe.  9. The aortic valve is tricuspid. 10. There is left bowing of the interatrial septum, suggestive of elevated right atrial pressure.  Discharge Medications   Allergies as of 09/20/2018      Reactions   Lisinopril Swelling   REACTION: pt had a swelling episode.  His lips swelled   Penicillins Hives   Has patient had a PCN reaction causing immediate rash, facial/tongue/throat swelling, SOB or lightheadedness with hypotension: Yes Has patient had a PCN reaction causing severe rash involving mucus membranes or skin necrosis: No Has patient had a PCN reaction that required hospitalization: No Has patient had a PCN reaction occurring within the last 10 years: No If all of the above answers are "NO", then may proceed with Cephalosporin use.      Medication List    STOP taking these medications   folic acid 1 MG tablet Commonly known as:  FOLVITE   ibuprofen 200 MG tablet Commonly known as:  ADVIL   losartan 25 MG tablet Commonly known as:  COZAAR   magnesium oxide 400 MG tablet Commonly known as:  MAG-OX   metolazone 2.5 MG  tablet Commonly known as:  ZAROXOLYN   Potassium Chloride ER 20 MEQ Tbcr   torsemide 20 MG tablet Commonly known as:  DEMADEX   traMADol 50 MG tablet Commonly known as:  ULTRAM     TAKE these medications   acetaminophen 500 MG tablet Commonly known as:  TYLENOL You can take 1000 mg every 8 hours as needed for pain.  Can alternate this with ibuprofen, or Tramadol.  Do not exceed 4000 mg of Tylenol(acetaminophen) per day it can harm your  liver.  You can buy this over-the-counter at any drugstore.   digoxin 0.125 MG tablet Commonly known as:  LANOXIN Take 1 tablet (0.125 mg total) by mouth daily.   docusate sodium 100 MG capsule Commonly known as:  COLACE Take 1 capsule (100 mg total) by mouth 2 (two) times daily.   feeding supplement (ENSURE ENLIVE) Liqd Take 237 mLs by mouth 3 (three) times daily between meals.   feeding supplement (PRO-STAT SUGAR FREE 64) Liqd Take 30 mLs by mouth 2 (two) times daily.   isosorbide-hydrALAZINE 20-37.5 MG tablet Commonly known as:  BIDIL Take 0.5 tablets by mouth 3 (three) times daily.   multivitamin with minerals Tabs tablet Take 1 tablet by mouth daily.   pantoprazole 40 MG tablet Commonly known as:  PROTONIX Take 1 tablet (40 mg total) by mouth daily.   polyethylene glycol 17 g packet Commonly known as:  MIRALAX / GLYCOLAX Take 17 g by mouth daily as needed for moderate constipation.   senna 8.6 MG Tabs tablet Commonly known as:  SENOKOT Take 1 tablet (8.6 mg total) by mouth daily as needed for mild constipation.   sodium hypochlorite 0.125 % Soln Commonly known as:  DAKIN'S 1/4 STRENGTH Irrigate with as directed daily at 12 noon.   thiamine 100 MG tablet Place 1 tablet (100 mg total) into feeding tube daily.       Disposition   The patient will be discharged in stable condition to SNF  Discharge Instructions    Diet - low sodium heart healthy   Complete by:  As directed    Heart Failure patients record your daily  weight using the same scale at the same time of day   Complete by:  As directed    Increase activity slowly   Complete by:  As directed      Follow-up Information    [facility] Follow up on 11/20/2018.   Specialty:  Cardiology Why:  1000 Contact information: [address] [id number] [place] [phone]            Duration of Discharge Encounter: Greater than 35 minutes   Signed, Darrick Grinder, NP 09/20/2018, 2:04 PM

## 2018-08-31 NOTE — Progress Notes (Signed)
Physical Therapy Treatment Patient Details Name: Kyle Conrad MRN: [id number] DOB: 03-Oct-1965 Today's Date: 08/31/2018    History of Present Illness Pt is a 53 y.o. male admitted 08/16/18 with AMS after being found down by neighbor; treated for cardiogenic shock, septic shock with citrobacter bactermeia. Also with AKI; on CRRT 4/3-4/8. Has remained encephalopathic throughout admission. Head CT 4/4 with no acute changes. PMH includes biventricular HF, HTN.    PT Comments    Pt limited this session by being soiled.  Turned clean up session into activity.  Emphasized standing at EOB for clean up and dressing ABD's change out due to they were soiled.  Pt up on his feet over 4 min.  Pt needed to eat due to hypoglycemic.  Will return for ambulation as able.    Follow Up Recommendations  SNF;Supervision/Assistance - 24 hour     Equipment Recommendations  Other (comment)(TBA)    Recommendations for Other Services       Precautions / Restrictions Precautions Precautions: Fall    Mobility  Bed Mobility Overal bed mobility: Needs Assistance Bed Mobility: Supine to Sit     Supine to sit: Min assist     General bed mobility comments: cues for direction, assist for support to give pt a chance to position UE's for assist  Transfers Overall transfer level: Needs assistance Equipment used: None Transfers: Sit to/from [employer] Transfers Sit to Stand: Mod assist Stand pivot transfers: Mod assist       General transfer comment: cues for hand placement, assist to help pt forward and then boost.  Ambulation/Gait                 Stairs             Wheelchair Mobility    Modified Rankin (Stroke Patients Only)       Balance     Sitting balance-[name]: Fair     Standing balance support: Bilateral upper extremity supported Standing balance-[name]: Poor Standing balance comment: stood at EOB holding onto the recliner back for approx 4 min  while pt's soiled dressing cover ABD's were changed.  pt stood without any extra assist.                            Cognition Arousal/Alertness: Awake/alert Behavior During Therapy: Flat affect Overall Cognitive Status: No family/caregiver present to determine baseline cognitive functioning                     Current Attention Level: Selective   Following Commands: Follows one step commands with increased time Safety/Judgement: Decreased awareness of safety;Decreased awareness of deficits Awareness: Intellectual;Emergent Problem Solving: Slow processing;Decreased initiation;Difficulty sequencing;Requires verbal cues        Exercises      General Comments General comments (skin integrity, edema, etc.): vss      Pertinent Vitals/Pain Pain Assessment: Faces Faces Pain Scale: No hurt    Home Living                      Prior Function            PT Goals (current goals can now be found in the care plan section) Acute Rehab PT Goals Patient Stated Goal: get better at his walking PT Goal Formulation: With patient Time For Goal Achievement: 09/05/18 Potential to Achieve Goals: Fair Progress towards PT goals: Progressing toward goals    Frequency  Min 2X/week      PT Plan Current plan remains appropriate    Co-evaluation              AM-PAC PT "6 Clicks" Mobility   Outcome Measure  Help needed turning from your back to your side while in a flat bed without using bedrails?: A Little Help needed moving from lying on your back to sitting on the side of a flat bed without using bedrails?: A Little Help needed moving to and from a bed to a chair (including a wheelchair)?: A Lot Help needed standing up from a chair using your arms (e.g., wheelchair or bedside chair)?: A Lot Help needed to walk in hospital room?: A Little Help needed climbing 3-5 steps with a railing? : A Lot 6 Click Score: 15    End of Session   Activity  Tolerance: Patient tolerated treatment well Patient left: in chair;with call bell/phone within reach;with nursing/sitter in room Nurse Communication: Mobility status PT Visit Diagnosis: Other abnormalities of gait and mobility (R26.89);Muscle weakness (generalized) (M62.81)     Time: 3143-8887 PT Time Calculation (min) (ACUTE ONLY): 17 min  Charges:  $Therapeutic Activity: 8-22 mins                     08/31/2018  Donnella Sham, PT Acute Rehabilitation Services [phone]  (pager) [phone]  (office)   Tessie Fass Mabeline Varas 08/31/2018, 1:07 PM

## 2018-08-31 NOTE — Progress Notes (Signed)
Replace condom catheter due to falling off, pericare provided

## 2018-08-31 NOTE — Consult Note (Signed)
Consult for pending SNF wound care recommendations. WOC consult completed this week. No acute changes per the bedside nurse.   Continue with wound care as ordered.   [place], Eddy, O[place]ta

## 2018-09-01 LAB — BASIC METABOLIC PANEL
Anion gap: 8 (ref 5–15)
BUN: 33 mg/dL — ABNORMAL HIGH (ref 6–20)
CO2: 28 mmol/L (ref 22–32)
Calcium: 8.4 mg/dL — ABNORMAL LOW (ref 8.9–10.3)
Chloride: 97 mmol/L — ABNORMAL LOW (ref 98–111)
Creatinine, Ser: 0.83 mg/dL (ref 0.61–1.24)
GFR calc Af Amer: >60 mL/min (ref >60)
GFR calc non Af Amer: >60 mL/min (ref >60)
Glucose, Bld: 67 mg/dL — ABNORMAL LOW (ref 70–99)
Potassium: 4.9 mmol/L (ref 3.5–5.1)
Sodium: 133 mmol/L — ABNORMAL LOW (ref 135–145)

## 2018-09-01 LAB — CBC WITH DIFFERENTIAL/PLATELET
Abs Immature Granulocytes: 0.31 10*3/uL — ABNORMAL HIGH (ref 0.00–0.07)
Basophils Absolute: 0.1 10*3/uL (ref 0.0–0.1)
Basophils Relative: 1 %
Eosinophils Absolute: 0.2 10*3/uL (ref 0.0–0.5)
Eosinophils Relative: 3 %
HCT: 25.2 % — ABNORMAL LOW (ref 39.0–52.0)
Hemoglobin: 8.3 g/dL — ABNORMAL LOW (ref 13.0–17.0)
Immature Granulocytes: 5 %
Lymphocytes Relative: 19 %
Lymphs Abs: 1.3 10*3/uL (ref 0.7–4.0)
MCH: 29.7 pg (ref 26.0–34.0)
MCHC: 32.9 g/dL (ref 30.0–36.0)
MCV: 90.3 fL (ref 80.0–100.0)
Monocytes Absolute: 0.7 10*3/uL (ref 0.1–1.0)
Monocytes Relative: 10 %
Neutro Abs: 4.1 10*3/uL (ref 1.7–7.7)
Neutrophils Relative %: 62 %
Platelets: 335 10*3/uL (ref 150–400)
RBC: 2.79 MIL/uL — ABNORMAL LOW (ref 4.22–5.81)
RDW: 17.1 % — ABNORMAL HIGH (ref 11.5–15.5)
WBC: 6.6 10*3/uL (ref 4.0–10.5)
nRBC: 0.3 % — ABNORMAL HIGH (ref 0.0–0.2)

## 2018-09-01 LAB — GLUCOSE, CAPILLARY
Glucose-Capillary: 58 mg/dL — ABNORMAL LOW (ref 70–99)
Glucose-Capillary: 73 mg/dL (ref 70–99)
Glucose-Capillary: 120 mg/dL — ABNORMAL HIGH (ref 70–99)
Glucose-Capillary: 77 mg/dL (ref 70–99)

## 2018-09-01 LAB — COOXEMETRY PANEL
Carboxyhemoglobin: 1.2 % (ref 0.5–1.5)
Methemoglobin: 1.7 % — ABNORMAL HIGH (ref 0.0–1.5)
O2 Saturation: 66.5 %
Total hemoglobin: 8.6 g/dL — ABNORMAL LOW (ref 12.0–16.0)

## 2018-09-01 NOTE — Progress Notes (Signed)
Third dose of Bi-Dil held tonight due to systolic B/P sustaining less than 100. Will ask for parameters to be placed with this medication. See flowsheet. Patient resting comfortably in bed.

## 2018-09-01 NOTE — Progress Notes (Signed)
. Patient ID: Kyle Conrad, male   DOB: 1966-03-05, 53 y.o.   MRN: [id number]     Advanced Heart Failure Rounding Note  PCP-Cardiologist: No primary care provider on file.   Subjective:    Remains overall stable. Denies CP or SOB. Feels weak.   Torsemide increased to 40 bid yesterday. Weight down 6 pounds. Renal function stable.    Objective:   Weight Range: 76.2 kg Body mass index is 22.16 kg/m.   Vital Signs:   Temp:  [97.5 F (36.4 C)-98.3 F (36.8 C)] 97.5 F (36.4 C) (04/18 0817) Pulse Rate:  [84-95] 88 (04/18 0817) Resp:  [11-21] 11 (04/18 0817) BP: (93-132)/(59-80) 132/80 (04/18 0817) SpO2:  [95 %-100 %] 98 % (04/18 0817) Weight:  [76.2 kg] 76.2 kg (04/18 0500) Last BM Date: 08/31/18  Weight change: Filed Weights   08/30/18 0323 08/31/18 0544 09/01/18 0500  Weight: 78.7 kg 78.7 kg 76.2 kg    Intake/Output:   Intake/Output Summary (Last 24 hours) at 09/01/2018 1001 Last data filed at 09/01/2018 0525 Gross per 24 hour  Intake 824 ml  Output 3800 ml  Net -2976 ml      Physical Exam   CVP   General:  Sitting up in bed Weak  No resp difficulty HEENT: normal Neck: supple. flat. Carotids 2+ bilat; no bruits. No lymphadenopathy or thryomegaly appreciated. Cor: PMI laterally displaced. Regular rate & rhythm. No rubs, gallops or murmurs. Lungs: clear Abdomen: soft, nontender, nondistended. No hepatosplenomegaly. No bruits or masses. Good bowel sounds. Extremities: no cyanosis, clubbing, rash, edema multiple skin wounds and sacral decub Neuro: alert & orientedx3, cranial nerves grossly intact. moves all 4 extremities w/o difficulty. Affect tangential    Telemetry   NSR 80-90s Personally reviewed   Labs    CBC Recent Labs    08/31/18 0530 09/01/18 0533  WBC 5.3 6.6  NEUTROABS 3.1 4.1  HGB 8.0* 8.3*  HCT 25.3* 25.2*  MCV 90.4 90.3  PLT 287 606   Basic Metabolic Panel Recent Labs    08/31/18 0530 09/01/18 0533  NA 135 133*  K 5.0 4.9  CL  99 97*  CO2 33* 28  GLUCOSE 72 67*  BUN 34* 33*  CREATININE 0.78 0.83  CALCIUM 8.3* 8.4*  MG 1.9  --    Liver Function Tests No results for input(s): AST, ALT, ALKPHOS, BILITOT, PROT, ALBUMIN in the last 72 hours. No results for input(s): LIPASE, AMYLASE in the last 72 hours. Cardiac Enzymes No results for input(s): CKTOTAL, CKMB, CKMBINDEX, TROPONINI in the last 72 hours.  BNP: BNP (last 3 results) Recent Labs    03/24/18 1435 06/05/18 1411 08/16/18 1611  BNP 2,471.9* 3,961.4* 2,433.3*    ProBNP (last 3 results) No results for input(s): PROBNP in the last 8760 hours.   D-Dimer No results for input(s): DDIMER in the last 72 hours. Hemoglobin A1C No results for input(s): HGBA1C in the last 72 hours. Fasting Lipid Panel No results for input(s): CHOL, HDL, LDLCALC, TRIG, CHOLHDL, LDLDIRECT in the last 72 hours. Thyroid Function Tests No results for input(s): TSH, T4TOTAL, T3FREE, THYROIDAB in the last 72 hours.  Invalid input(s): FREET3  Other results:   Imaging    No results found.   Medications:     Scheduled Medications: . Chlorhexidine Gluconate Cloth  6 each Topical Daily  . collagenase   Topical Daily  . digoxin  0.125 mg Oral Daily  . feeding supplement (ENSURE ENLIVE)  237 mL Oral TID BM  .  feeding supplement (PRO-STAT SUGAR FREE 64)  30 mL Oral BID  . Gerhardt's butt cream   Topical Daily  . heparin injection (subcutaneous)  5,000 Units Subcutaneous Q8H  . isosorbide-hydrALAZINE  0.5 tablet Oral TID  . mouth rinse  15 mL Mouth Rinse q12n4p  . pantoprazole  40 mg Oral Daily  . polyethylene glycol  17 g Oral Daily  . sodium chloride flush  10-40 mL Intracatheter Q12H  . thiamine  100 mg Per Tube Daily  . torsemide  40 mg Oral Daily    Infusions: . sodium chloride 250 mL (08/28/18 0014)  . sodium chloride 10 mL/hr at 08/31/18 0600    PRN Medications: Place/Maintain arterial line **AND** sodium chloride, acetaminophen (TYLENOL) oral liquid  160 mg/5 mL, lip balm, ondansetron (ZOFRAN) IV, senna, sodium chloride flush   Assessment/Plan   1. 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  He is markedly volume overloaded with suspected mixed septic/cardiogenic shock.  Echo 4/3 with severe biventricular failure, LVEF 20-25% small effusion, moderate to severe MR and severe TR.  He is now off dobutamine.   BUN/creatinine stable off CVVH. - CVP 5. Co-ox 67% - Continue torsemide 40 mg bid (PTA dosing) . Can adjust as needed  - Continue Bidil 1/2 tab tid.  BP to low too titrate - Continue digoxin 0.125 daily. Level 0.3 today - Off spiro due to hyperkalemia - No b-blocker with recent shock. No ACE/ARB/ARN with recent AKI - He is not going to be a candidate for advanced therapies.  LVAD likely not an option regardless with severe RV dysfunction and has had very poor compliance with medical therapy.   2. ID: Suspect component of septic shock, PCT 6.76.  Citrobacter freundii in blood initially. ?Source from lower leg/foot skin breakdown. Febrile again am 4/9.  Afebrile today with normal WBCs.  CXR with LLL atelectasis versus infiltrate. Repeat cultures NGTD.  - Antibiotics broadened again on 4/9, currently on vancomycin/cefepime.  - Narrowed back to ceftriaxone 4/12. Stopped 4/15 Discussed with pharmacy.  - Most recent cultures remain negative.   3. AKI: Concern for ATN in setting of shock.  Was down at home for an undetermined length of time.  Initially required CVVH, now off. Resolved.  - Continue torsemide 40 bid  4. Thrombocytopenia: Noted at admission, normal plts in the past.  Suspect related to sepsis (normal fibrinogen, no schistocytes in blood).  Transfused 1 unit plts 4/5 and again on 4/6.   - Resolved.   5. Hypoglyemia: Resolved.   6. Neuro: Confusion/delirium, likely in setting of  critical illness/infection. Head CT 4/4 with no acute changes. Passed swallow study.  Aphonia resolved.  - improved 7. DVT prophylaxis: - On [place] heparin  8. Vascular: ABIs normal.  Suspect skin breakdown on foot related to venous stasis and marked swelling initially.    9. FEN:  - severe protein calorie malnutrition. Received short term tube feeds with Cor-trak which was removed 4/14. Appetite poor - will reconsult dietary for calorie count  10. LE wounds and sacral decub -Re consulted [place] for d/c recommendations.  11. Hypomagnesemia  Received 2 grams mag on 4/15.  Mag 1.9 today. Give 2 grams mag. 12. Deconditioning: Severe.  Out of bed, PT/OT.  Will need SNF - see below.    13. Anemia --Received 1 unit PRBCs on 08/30/18. Hgb stable today 8.  14. Disposition -  Prognosis quite poor. He is ready for SNF placement when bed available.   - Now agrees to SNF placement but complicated by lack of insurance. Palliative Care also discussing Hospice options.     Length of Stay: 16  Glori Bickers, MD  10:01 AM  Advanced Heart Failure Team Pager [phone] (M-F; 7a - 4p)  Please contact [facility] for night-coverage after hours (4p -7a ) and weekends on [email or web address]

## 2018-09-02 LAB — BASIC METABOLIC PANEL
Anion gap: 9 (ref 5–15)
BUN: 33 mg/dL — ABNORMAL HIGH (ref 6–20)
CO2: 29 mmol/L (ref 22–32)
Calcium: 8.2 mg/dL — ABNORMAL LOW (ref 8.9–10.3)
Chloride: 94 mmol/L — ABNORMAL LOW (ref 98–111)
Creatinine, Ser: 0.93 mg/dL (ref 0.61–1.24)
GFR calc Af Amer: >60 mL/min (ref >60)
GFR calc non Af Amer: >60 mL/min (ref >60)
Glucose, Bld: 91 mg/dL (ref 70–99)
Potassium: 4.4 mmol/L (ref 3.5–5.1)
Sodium: 132 mmol/L — ABNORMAL LOW (ref 135–145)

## 2018-09-02 LAB — CBC WITH DIFFERENTIAL/PLATELET
Abs Immature Granulocytes: 0.37 10*3/uL — ABNORMAL HIGH (ref 0.00–0.07)
Basophils Absolute: 0.1 10*3/uL (ref 0.0–0.1)
Basophils Relative: 1 %
Eosinophils Absolute: 0.2 10*3/uL (ref 0.0–0.5)
Eosinophils Relative: 2 %
HCT: 25.3 % — ABNORMAL LOW (ref 39.0–52.0)
Hemoglobin: 8 g/dL — ABNORMAL LOW (ref 13.0–17.0)
Immature Granulocytes: 6 %
Lymphocytes Relative: 19 %
Lymphs Abs: 1.2 10*3/uL (ref 0.7–4.0)
MCH: 29.2 pg (ref 26.0–34.0)
MCHC: 31.6 g/dL (ref 30.0–36.0)
MCV: 92.3 fL (ref 80.0–100.0)
Monocytes Absolute: 0.7 10*3/uL (ref 0.1–1.0)
Monocytes Relative: 11 %
Neutro Abs: 3.8 10*3/uL (ref 1.7–7.7)
Neutrophils Relative %: 61 %
Platelets: 290 10*3/uL (ref 150–400)
RBC: 2.74 MIL/uL — ABNORMAL LOW (ref 4.22–5.81)
RDW: 18.5 % — ABNORMAL HIGH (ref 11.5–15.5)
WBC: 6.2 10*3/uL (ref 4.0–10.5)
nRBC: 0.5 % — ABNORMAL HIGH (ref 0.0–0.2)

## 2018-09-02 LAB — COOXEMETRY PANEL
Carboxyhemoglobin: 1.5 % (ref 0.5–1.5)
Methemoglobin: 2 % — ABNORMAL HIGH (ref 0.0–1.5)
O2 Saturation: 74.3 %
Total hemoglobin: 8.4 g/dL — ABNORMAL LOW (ref 12.0–16.0)

## 2018-09-02 MED ORDER — SODIUM CHLORIDE 0.9 % IV BOLUS
500.0000 mL | Freq: Once | INTRAVENOUS | Status: AC
  Administered 2018-09-02: 500 mL via INTRAVENOUS

## 2018-09-02 NOTE — Progress Notes (Signed)
. Patient ID: Kyle Conrad, male   DOB: April 23, 1966, 53 y.o.   MRN: [id number]     Advanced Heart Failure Rounding Note  PCP-Cardiologist: No primary care provider on file.   Subjective:    Remains overall stable. Denies CP or SOB. Says he feels tired. No orothopnea or PND. Feels like he is getting too much food.   Torsemide increased to 40 bid on 4/17. Weight down 14 pounds in 2 days. Bidil held last night due to SBP < 100. CVP 2.    Objective:   Weight Range: 72.3 kg Body mass index is 21.03 kg/m.   Vital Signs:   Temp:  [97.5 F (36.4 C)-98.4 F (36.9 C)] 98.3 F (36.8 C) (04/19 0759) Pulse Rate:  [74-101] 93 (04/19 0800) Resp:  [14-19] 15 (04/19 0800) BP: (87-114)/(50-77) 114/77 (04/19 0800) SpO2:  [93 %-100 %] 100 % (04/19 0800) Weight:  [72.3 kg] 72.3 kg (04/19 0454) Last BM Date: 09/01/18  Weight change: Filed Weights   08/31/18 0544 09/01/18 0500 09/02/18 0454  Weight: 78.7 kg 76.2 kg 72.3 kg    Intake/Output:   Intake/Output Summary (Last 24 hours) at 09/02/2018 1047 Last data filed at 09/02/2018 1002 Gross per 24 hour  Intake 120 ml  Output 4002 ml  Net -3882 ml      Physical Exam   General:  Lying flat in bed chronically ill appearing. NAD   HEENT: normal x for temporal wasting  Neck: supple. no JVD. Carotids 2+ bilat; no bruits. No lymphadenopathy or thryomegaly appreciated. Cor: PMI laterally displaced. Regular rate & rhythm. No rubs, gallops or murmurs. Lungs: clear Abdomen: soft, nontender, nondistended. No hepatosplenomegaly. No bruits or masses. Good bowel sounds. Extremities: no cyanosis, clubbing, rash, edema multiple wounds with dressings  Neuro: alert & orientedx3, cranial nerves grossly intact. moves all 4 extremities w/o difficulty. Affect pleasant   Telemetry   NSR 80-90s Personally reviewed   Labs    CBC Recent Labs    09/01/18 0533 09/02/18 0453  WBC 6.6 6.2  NEUTROABS 4.1 3.8  HGB 8.3* 8.0*  HCT 25.2* 25.3*  MCV  90.3 92.3  PLT 335 166   Basic Metabolic Panel Recent Labs    08/31/18 0530 09/01/18 0533 09/02/18 0753  NA 135 133* 132*  K 5.0 4.9 4.4  CL 99 97* 94*  CO2 33* 28 29  GLUCOSE 72 67* 91  BUN 34* 33* 33*  CREATININE 0.78 0.83 0.93  CALCIUM 8.3* 8.4* 8.2*  MG 1.9  --   --    Liver Function Tests No results for input(s): AST, ALT, ALKPHOS, BILITOT, PROT, ALBUMIN in the last 72 hours. No results for input(s): LIPASE, AMYLASE in the last 72 hours. Cardiac Enzymes No results for input(s): CKTOTAL, CKMB, CKMBINDEX, TROPONINI in the last 72 hours.  BNP: BNP (last 3 results) Recent Labs    03/24/18 1435 06/05/18 1411 08/16/18 1611  BNP 2,471.9* 3,961.4* 2,433.3*    ProBNP (last 3 results) No results for input(s): PROBNP in the last 8760 hours.   D-Dimer No results for input(s): DDIMER in the last 72 hours. Hemoglobin A1C No results for input(s): HGBA1C in the last 72 hours. Fasting Lipid Panel No results for input(s): CHOL, HDL, LDLCALC, TRIG, CHOLHDL, LDLDIRECT in the last 72 hours. Thyroid Function Tests No results for input(s): TSH, T4TOTAL, T3FREE, THYROIDAB in the last 72 hours.  Invalid input(s): FREET3  Other results:   Imaging    No results found.   Medications:  Scheduled Medications: . Chlorhexidine Gluconate Cloth  6 each Topical Daily  . collagenase   Topical Daily  . digoxin  0.125 mg Oral Daily  . feeding supplement (ENSURE ENLIVE)  237 mL Oral TID BM  . feeding supplement (PRO-STAT SUGAR FREE 64)  30 mL Oral BID  . Gerhardt's butt cream   Topical Daily  . heparin injection (subcutaneous)  5,000 Units Subcutaneous Q8H  . isosorbide-hydrALAZINE  0.5 tablet Oral TID  . mouth rinse  15 mL Mouth Rinse q12n4p  . pantoprazole  40 mg Oral Daily  . polyethylene glycol  17 g Oral Daily  . sodium chloride flush  10-40 mL Intracatheter Q12H  . thiamine  100 mg Per Tube Daily  . torsemide  40 mg Oral Daily    Infusions: . sodium chloride 250  mL (08/28/18 0014)    PRN Medications: acetaminophen (TYLENOL) oral liquid 160 mg/5 mL, lip balm, ondansetron (ZOFRAN) IV, senna, sodium chloride flush   Assessment/Plan   1. 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  He is markedly volume overloaded with suspected mixed septic/cardiogenic shock.  Echo 4/3 with severe biventricular failure, LVEF 20-25% small effusion, moderate to severe MR and severe TR.  He is now off dobutamine.   BUN/creatinine stable off CVVH. - CVP 2 Co-ox 74% - Weight way down after increasing torsemide to 40 bid (home dose). Will hold for one day. Would resume torsemdie 60 daily at d/c. Will give 500cc NS today as he is a but dry and BP soft.  - Continue Bidil 1/2 tab tid.  Hold SBP < 95 - Continue digoxin 0.125 daily. Level 0.3 - Off spiro due to hyperkalemia - No b-blocker with recent shock. No ACE/ARB/ARN with recent AKI - He is not going to be a candidate for advanced therapies.  LVAD likely not an option regardless with severe RV dysfunction and has had very poor compliance with medical therapy.   2. ID: Suspect component of septic shock, PCT 6.76.  Citrobacter freundii in blood initially. ?Source from lower leg/foot skin breakdown. Febrile again am 4/9.  Afebrile today with normal WBCs.  CXR with LLL atelectasis versus infiltrate. Repeat cultures NGTD.  - Antibiotics broadened again on 4/9, currently on vancomycin/cefepime.  - Narrowed back to ceftriaxone 4/12. Stopped 4/15 Discussed with pharmacy.  - Most recent cultures remain negative.   3. AKI: Concern for ATN in setting of shock.  Was down at home for an undetermined length of time.  Initially required CVVH, now off. Resolved.  - Management of diuretics as above   4. Thrombocytopenia: Noted at admission, normal plts in the past.  Suspect related to  sepsis (normal fibrinogen, no schistocytes in blood).  Transfused 1 unit plts 4/5 and again on 4/6.   - Resolved.    5. Hypoglyemia: Resolved.     6. Neuro: Confusion/delirium, likely in setting of critical illness/infection. Head CT 4/4 with no acute changes. Passed swallow study.  Aphonia resolved.  - improved  7. DVT prophylaxis: - On [place] heparin   8. Vascular: ABIs normal.  Suspect skin breakdown on foot related to venous stasis and marked swelling initially.     9. FEN:  - severe protein calorie malnutrition. Received short term tube feeds with Cor-trak which was removed 4/14.   10. LE wounds and sacral decub -Re consulted [place] for d/c recommendations.   [place]  Deconditioning: Severe.  Out of bed, PT/OT.  Will need SNF - see below.   12. Anemia --Received 1 unit PRBCs on 08/30/18. Hgb stable today 8.0   14. Disposition - Prognosis quite poor. He is ready for SNF placement when bed available.   - Now agrees to SNF placement but complicated by lack of insurance. Palliative Care also discussing Hospice options. I d/w Palliative Care team personally today.     Length of Stay: 17  Glori Bickers, MD  10:47 AM  Advanced Heart Failure Team Pager [phone] (M-F; 7a - 4p)  Please contact [facility] for night-coverage after hours (4p -7a ) and weekends on [email or web address]

## 2018-09-03 LAB — CBC WITH DIFFERENTIAL/PLATELET
Abs Immature Granulocytes: 0.2 10*3/uL — ABNORMAL HIGH (ref 0.00–0.07)
Basophils Absolute: 0.1 10*3/uL (ref 0.0–0.1)
Basophils Relative: 2 %
Eosinophils Absolute: 0.2 10*3/uL (ref 0.0–0.5)
Eosinophils Relative: 3 %
HCT: 25.4 % — ABNORMAL LOW (ref 39.0–52.0)
Hemoglobin: 8.1 g/dL — ABNORMAL LOW (ref 13.0–17.0)
Lymphocytes Relative: 14 %
Lymphs Abs: 0.9 10*3/uL (ref 0.7–4.0)
MCH: 28.7 pg (ref 26.0–34.0)
MCHC: 31.9 g/dL (ref 30.0–36.0)
MCV: 90.1 fL (ref 80.0–100.0)
Monocytes Absolute: 0.4 10*3/uL (ref 0.1–1.0)
Monocytes Relative: 6 %
Myelocytes: 2 %
Neutro Abs: 4.7 10*3/uL (ref 1.7–7.7)
Neutrophils Relative %: 72 %
Platelets: 311 10*3/uL (ref 150–400)
Promyelocytes Relative: 1 %
RBC: 2.82 MIL/uL — ABNORMAL LOW (ref 4.22–5.81)
RDW: 17.6 % — ABNORMAL HIGH (ref 11.5–15.5)
WBC: 6.5 10*3/uL (ref 4.0–10.5)
nRBC: 0.3 % — ABNORMAL HIGH (ref 0.0–0.2)
nRBC: 1 /100 WBC — ABNORMAL HIGH

## 2018-09-03 LAB — BASIC METABOLIC PANEL
Anion gap: 7 (ref 5–15)
BUN: 33 mg/dL — ABNORMAL HIGH (ref 6–20)
CO2: 30 mmol/L (ref 22–32)
Calcium: 8.2 mg/dL — ABNORMAL LOW (ref 8.9–10.3)
Chloride: 94 mmol/L — ABNORMAL LOW (ref 98–111)
Creatinine, Ser: 0.94 mg/dL (ref 0.61–1.24)
GFR calc Af Amer: >60 mL/min (ref >60)
GFR calc non Af Amer: >60 mL/min (ref >60)
Glucose, Bld: 70 mg/dL (ref 70–99)
Potassium: 5.4 mmol/L — ABNORMAL HIGH (ref 3.5–5.1)
Sodium: 131 mmol/L — ABNORMAL LOW (ref 135–145)

## 2018-09-03 LAB — COOXEMETRY PANEL
Carboxyhemoglobin: 1.5 % (ref 0.5–1.5)
Methemoglobin: 1.8 % — ABNORMAL HIGH (ref 0.0–1.5)
O2 Saturation: 83.3 %
Total hemoglobin: 8.2 g/dL — ABNORMAL LOW (ref 12.0–16.0)

## 2018-09-03 MED ORDER — SODIUM ZIRCONIUM CYCLOSILICATE 5 G PO PACK
5.0000 g | PACK | Freq: Once | ORAL | Status: AC
  Administered 2018-09-03: 5 g via ORAL
  Filled 2018-09-03: qty 1

## 2018-09-03 NOTE — Progress Notes (Signed)
Physical Therapy Treatment Patient Details Name: Kyle Conrad MRN: [id number] DOB: June 10, 1965 Today's Date: 09/03/2018    History of Present Illness Pt is a 53 y.o. male admitted 08/16/18 with AMS after being found down by neighbor; treated for cardiogenic shock, septic shock with citrobacter bactermeia. Also with AKI; on CRRT 4/3-4/8. Has remained encephalopathic throughout admission. Head CT 4/4 with no acute changes. PMH includes biventricular HF, HTN.    PT Comments    Pt ambulated hallway distance with RW this session, limited by fatigue and intermittent dizziness. BP 78/59 upon initial standing with symptomatic dizziness, but dizziness passed with continued standing. Pt very pleasant with PT this session, and was cooperative with all PT interventions. PT continuing to recommend SNF placement to address deficits, will continue to follow acutely and progress mobility as able.    Follow Up Recommendations  SNF;Supervision/Assistance - 24 hour     Equipment Recommendations  Other (comment)(TBA)    Recommendations for Other Services       Precautions / Restrictions Precautions Precautions: Fall Restrictions Weight Bearing Restrictions: No    Mobility  Bed Mobility Overal bed mobility: Needs Assistance             General bed mobility comments: Pt up in chair upon PT arrival, requesting up in chair upon PT exit.   Transfers Overall transfer level: Needs assistance Equipment used: Rolling walker (2 wheeled) Transfers: Sit to/from Stand Sit to Stand: Min assist         General transfer comment: Min assist for power up, verbal cuing for hand placement to power up from recliner. Pt reporting dizziness upon standing, BP 78/59. Dizziness passed with continued standing.   Ambulation/Gait Ambulation/Gait assistance: Min guard Gait Distance (Feet): 80 Feet Assistive device: Rolling walker (2 wheeled) Gait Pattern/deviations: Step-through pattern;Decreased stride  length     General Gait Details: min guard for safety, slow short steps throughout ambulation.    Stairs             Wheelchair Mobility    Modified Rankin (Stroke Patients Only)       Balance Overall balance assessment: Needs assistance Sitting-balance support: No upper extremity supported;Feet supported Sitting balance-[name]: Fair     Standing balance support: Bilateral upper extremity supported Standing balance-[name]: Poor Standing balance comment: reliant on RW for support                            Cognition Arousal/Alertness: Awake/alert Behavior During Therapy: Flat affect Overall Cognitive Status: No family/caregiver present to determine baseline cognitive functioning Area of Impairment: Attention;Memory;Following commands                   Current Attention Level: Selective Memory: Decreased short-term memory Following Commands: Follows one step commands with increased time Safety/Judgement: Decreased awareness of safety;Decreased awareness of deficits Awareness: Intellectual;Emergent Problem Solving: Slow processing;Decreased initiation;Requires verbal cues;Requires tactile cues General Comments: Pt pleasant and polite with PT this session, only became slightly irritated when his urinal was not close by and he had to urinate. Pt with slow processing/responses to PT this session.       Exercises      General Comments        Pertinent Vitals/Pain Pain Assessment: Faces Faces Pain Scale: Hurts little more Pain Location: "I hurt all over"  Pain Descriptors / Indicators: Grimacing;Sore Pain Intervention(s): Limited activity within patient's tolerance;Monitored during session;Repositioned    Home Living  Prior Function            PT Goals (current goals can now be found in the care plan section) Acute Rehab PT Goals Patient Stated Goal: get better at his walking PT Goal Formulation:  With patient Time For Goal Achievement: 09/05/18 Potential to Achieve Goals: Fair Progress towards PT goals: Progressing toward goals    Frequency    Min 2X/week      PT Plan Current plan remains appropriate    Co-evaluation              AM-PAC PT "6 Clicks" Mobility   Outcome Measure  Help needed turning from your back to your side while in a flat bed without using bedrails?: A Little Help needed moving from lying on your back to sitting on the side of a flat bed without using bedrails?: A Lot Help needed moving to and from a bed to a chair (including a wheelchair)?: A Lot Help needed standing up from a chair using your arms (e.g., wheelchair or bedside chair)?: A Lot Help needed to walk in hospital room?: A Little Help needed climbing 3-5 steps with a railing? : A Lot 6 Click Score: 14    End of Session Equipment Utilized During Treatment: Gait belt Activity Tolerance: Patient tolerated treatment well Patient left: in chair;with call bell/phone within reach;with chair alarm set Nurse Communication: Mobility status PT Visit Diagnosis: Other abnormalities of gait and mobility (R26.89);Muscle weakness (generalized) (M62.81)     Time: 0938-1829 PT Time Calculation (min) (ACUTE ONLY): 31 min  Charges:  $Gait Training: 23-37 mins                    Julien Girt, PT Acute Rehabilitation Services Pager [phone]  Office [phone]    Kyle Conrad D Elonda Husky 09/03/2018, 3:05 PM

## 2018-09-03 NOTE — Progress Notes (Addendum)
. Patient ID: Kyle Conrad, male   DOB: November 15, 1965, 53 y.o.   MRN: [id number]     Advanced Heart Failure Rounding Note  PCP-Cardiologist: No primary care provider on file.   Subjective:    Torsemide stopped yesterday and given some IVF due to low CVP and hypotension.   Feels ok today. No CP or SOB. No orthopnea or PND. SBP now > 100 consistently  Refuses to use the bathroom despite nurses's urging. Wearing a diaper and making skin care difficult.   Objective:   Weight Range: 83.9 kg Body mass index is 24.41 kg/m.   Vital Signs:   Temp:  [97.7 F (36.5 C)-98.3 F (36.8 C)] 97.8 F (36.6 C) (04/20 0717) Pulse Rate:  [81-99] 99 (04/20 0500) Resp:  [12-20] 18 (04/20 0717) BP: (95-117)/(64-88) 117/82 (04/20 0717) SpO2:  [97 %-100 %] 99 % (04/20 0717) Weight:  [83.9 kg] 83.9 kg (04/20 0519) Last BM Date: 09/01/18  Weight change: Filed Weights   09/01/18 0500 09/02/18 0454 09/03/18 0519  Weight: 76.2 kg 72.3 kg 83.9 kg    Intake/Output:   Intake/Output Summary (Last 24 hours) at 09/03/2018 0826 Last data filed at 09/03/2018 0000 Gross per 24 hour  Intake 1174 ml  Output 3836 ml  Net -2662 ml      Physical Exam   General:  Lying flat in bed. Weak and cachetic appearing   No resp difficulty HEENT: normal Neck: supple. no JVD. Carotids 2+ bilat; no bruits. No lymphadenopathy or thryomegaly appreciated. Cor: PMI laterally displaced. Regular rate & rhythm. No rubs, gallops or murmurs. Lungs: clear Abdomen: soft, nontender, nondistended. No hepatosplenomegaly. No bruits or masses. Good bowel sounds. Extremities: no cyanosis, clubbing, rash, edema multiple wounds Neuro: alert & orientedx3, cranial nerves grossly intact. moves all 4 extremities w/o difficulty. Affect pleasant   Telemetry   NSR 80-90s Personally reviewed   Labs    CBC Recent Labs    09/02/18 0453 09/03/18 0506  WBC 6.2 6.5  NEUTROABS 3.8 4.7  HGB 8.0* 8.1*  HCT 25.3* 25.4*  MCV 92.3 90.1   PLT 290 381   Basic Metabolic Panel Recent Labs    09/02/18 0753 09/03/18 0506  NA 132* 131*  K 4.4 5.4*  CL 94* 94*  CO2 29 30  GLUCOSE 91 70  BUN 33* 33*  CREATININE 0.93 0.94  CALCIUM 8.2* 8.2*   Liver Function Tests No results for input(s): AST, ALT, ALKPHOS, BILITOT, PROT, ALBUMIN in the last 72 hours. No results for input(s): LIPASE, AMYLASE in the last 72 hours. Cardiac Enzymes No results for input(s): CKTOTAL, CKMB, CKMBINDEX, TROPONINI in the last 72 hours.  BNP: BNP (last 3 results) Recent Labs    03/24/18 1435 06/05/18 1411 08/16/18 1611  BNP 2,471.9* 3,961.4* 2,433.3*    ProBNP (last 3 results) No results for input(s): PROBNP in the last 8760 hours.   D-Dimer No results for input(s): DDIMER in the last 72 hours. Hemoglobin A1C No results for input(s): HGBA1C in the last 72 hours. Fasting Lipid Panel No results for input(s): CHOL, HDL, LDLCALC, TRIG, CHOLHDL, LDLDIRECT in the last 72 hours. Thyroid Function Tests No results for input(s): TSH, T4TOTAL, T3FREE, THYROIDAB in the last 72 hours.  Invalid input(s): FREET3  Other results:   Imaging    No results found.   Medications:     Scheduled Medications: . Chlorhexidine Gluconate Cloth  6 each Topical Daily  . collagenase   Topical Daily  . digoxin  0.125 mg Oral Daily  .  feeding supplement (ENSURE ENLIVE)  237 mL Oral TID BM  . feeding supplement (PRO-STAT SUGAR FREE 64)  30 mL Oral BID  . Gerhardt's butt cream   Topical Daily  . heparin injection (subcutaneous)  5,000 Units Subcutaneous Q8H  . isosorbide-hydrALAZINE  0.5 tablet Oral TID  . mouth rinse  15 mL Mouth Rinse q12n4p  . pantoprazole  40 mg Oral Daily  . polyethylene glycol  17 g Oral Daily  . sodium chloride flush  10-40 mL Intracatheter Q12H  . thiamine  100 mg Per Tube Daily    Infusions: . sodium chloride 250 mL (08/28/18 0014)    PRN Medications: acetaminophen (TYLENOL) oral liquid 160 mg/5 mL, lip balm,  ondansetron (ZOFRAN) IV, senna, sodium chloride flush   Assessment/Plan   1. 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  He is markedly volume overloaded with suspected mixed septic/cardiogenic shock.  Echo 4/3 with severe biventricular failure, LVEF 20-25% small effusion, moderate to severe MR and severe TR.  He is now off dobutamine.   BUN/creatinine stable off CVVH. - CVP 3-4 Co-ox 83% (?) - Weight way down yesterday after increasing torsemide to 40 bid (home dose). Torsemide on hold. Given IVF yesterday. BP improved. Hold torsemide today.  Would resume torsemdie 60 daily at d/c. - Continue Bidil 1/2 tab tid.  Hold SBP < 95 - Continue digoxin 0.125 daily. Level 0.3 - Off spiro due to hyperkalemia. Will give a dose of lokelma today - No b-blocker with recent shock. No ACE/ARB/ARN with recent AKI - He is not going to be a candidate for advanced therapies.  LVAD likely not an option regardless with severe RV dysfunction and has had very poor compliance with medical therapy.   2. ID: Suspect component of septic shock, PCT 6.76.  Citrobacter freundii in blood initially. ?Source from lower leg/foot skin breakdown or stool. Febrile again am 4/9.  Afebrile today with normal WBCs.  CXR with LLL atelectasis versus infiltrate. Repeat cultures NGTD.  - Antibiotics broadened again on 4/9, currently on vancomycin/cefepime.  - Narrowed back to ceftriaxone 4/12. Stopped 4/15 - Most recent cultures remain negative.   3. AKI: Concern for ATN in setting of shock.  Was down at home for an undetermined length of time.  Initially required CVVH, now off. Resolved.  - Management of diuretics as above   4. Thrombocytopenia: Noted at admission, normal plts in the past.  Suspect related to sepsis (normal fibrinogen, no schistocytes in blood).  Transfused 1 unit plts  4/5 and again on 4/6.   - Resolved.    5. Hypoglyemia: Resolved.     6. Neuro: Confusion/delirium, likely in setting of critical illness/infection. Head CT 4/4 with no acute changes. Passed swallow study.  Aphonia resolved.  - improved  7. Anemia - Received 1 unit PRBCs on 08/30/18. Hgb stable today 8.0   8. Vascular: ABIs normal.  Suspect skin breakdown on foot related to venous stasis and marked swelling initially.     9. FEN:  - severe protein calorie malnutrition. Received short term tube feeds with Cor-trak which was removed 4/14.   10. Hyperkalemia  - spiro off. Give one dose lokelma.   11. LE wounds and sacral decub - WOC has seen for d/c recommendations.  - Stressed need to use bedside commode and urinal and not diaper   12  Deconditioning: Severe.  Out  of bed, PT/OT.  Will need SNF - see below.   13. Disposition - Prognosis quite poor. He is ready for SNF placement when bed available.  - Need to d/w SW today.   - Now agrees to SNF placement but complicated by lack of insurance. Palliative Care also discussing Hospice options. I d/w Palliative Care team personally yesterday. He told me today that he says it is getting harder to fight but he wants to keep fighting.    Length of Stay: 18  Glori Bickers, MD  8:26 AM  Advanced Heart Failure Team Pager [phone] (M-F; 7a - 4p)  Please contact [facility] for night-coverage after hours (4p -7a ) and weekends on [email or web address]

## 2018-09-04 LAB — CBC WITH DIFFERENTIAL/PLATELET
Abs Immature Granulocytes: 0.52 10*3/uL — ABNORMAL HIGH (ref 0.00–0.07)
Basophils Absolute: 0.1 10*3/uL (ref 0.0–0.1)
Basophils Relative: 1 %
Eosinophils Absolute: 0.2 10*3/uL (ref 0.0–0.5)
Eosinophils Relative: 3 %
HCT: 25 % — ABNORMAL LOW (ref 39.0–52.0)
Hemoglobin: 7.8 g/dL — ABNORMAL LOW (ref 13.0–17.0)
Immature Granulocytes: 6 %
Lymphocytes Relative: 17 %
Lymphs Abs: 1.4 10*3/uL (ref 0.7–4.0)
MCH: 28.5 pg (ref 26.0–34.0)
MCHC: 31.2 g/dL (ref 30.0–36.0)
MCV: 91.2 fL (ref 80.0–100.0)
Monocytes Absolute: 0.8 10*3/uL (ref 0.1–1.0)
Monocytes Relative: 10 %
Neutro Abs: 5.2 10*3/uL (ref 1.7–7.7)
Neutrophils Relative %: 63 %
Platelets: 352 10*3/uL (ref 150–400)
RBC: 2.74 MIL/uL — ABNORMAL LOW (ref 4.22–5.81)
RDW: 17.5 % — ABNORMAL HIGH (ref 11.5–15.5)
WBC: 8.2 10*3/uL (ref 4.0–10.5)
nRBC: 0.2 % (ref 0.0–0.2)

## 2018-09-04 LAB — BASIC METABOLIC PANEL
Anion gap: 9 (ref 5–15)
BUN: 28 mg/dL — ABNORMAL HIGH (ref 6–20)
CO2: 25 mmol/L (ref 22–32)
Calcium: 8.4 mg/dL — ABNORMAL LOW (ref 8.9–10.3)
Chloride: 99 mmol/L (ref 98–111)
Creatinine, Ser: 0.85 mg/dL (ref 0.61–1.24)
GFR calc Af Amer: >60 mL/min (ref >60)
GFR calc non Af Amer: >60 mL/min (ref >60)
Glucose, Bld: 72 mg/dL (ref 70–99)
Potassium: 4.5 mmol/L (ref 3.5–5.1)
Sodium: 133 mmol/L — ABNORMAL LOW (ref 135–145)

## 2018-09-04 LAB — PATHOLOGIST SMEAR REVIEW

## 2018-09-04 LAB — COOXEMETRY PANEL
Carboxyhemoglobin: 1.3 % (ref 0.5–1.5)
Methemoglobin: 1.9 % — ABNORMAL HIGH (ref 0.0–1.5)
O2 Saturation: 72.3 %
Total hemoglobin: 8.4 g/dL — ABNORMAL LOW (ref 12.0–16.0)

## 2018-09-04 MED ORDER — ACETAMINOPHEN 325 MG PO TABS
325.0000 mg | ORAL_TABLET | Freq: Once | ORAL | Status: AC
  Administered 2018-09-04: 325 mg via ORAL
  Filled 2018-09-04: qty 1

## 2018-09-04 MED ORDER — ADULT MULTIVITAMIN W/MINERALS CH
1.0000 | ORAL_TABLET | Freq: Every day | ORAL | Status: DC
  Administered 2018-09-04 – 2018-09-20 (×17): 1 via ORAL
  Filled 2018-09-04 (×17): qty 1

## 2018-09-04 MED ORDER — ACETAMINOPHEN 325 MG PO TABS
650.0000 mg | ORAL_TABLET | Freq: Four times a day (QID) | ORAL | Status: DC | PRN
  Administered 2018-09-08 – 2018-09-15 (×4): 650 mg via ORAL
  Filled 2018-09-04 (×4): qty 2

## 2018-09-04 NOTE — Progress Notes (Addendum)
Palliative:  I met again today with Kyle Conrad. He is not having a good day. We were able to talk a little more today about his lack of improvement. He tells me that he wonders some days if the fight is worth it and "if there is a light at the end of the tunnel." He says that his family is encouraging him to keep fighting. He is desiring to continue on with aggressive care at this time. I do explain that he will continue to struggle from his biventricular heart failure.   We did discuss that everyone has a limit and discussed that sometimes people do decide that they can no longer fight if they are not achieving a QOL they find acceptable. He seemed a little surprised by this. I explained further that when his health is bad and he has more bad days than good and nothing much to look forward too than sometimes the energy and the frustration of aggressive care is no longer desired. I explained that it is okay to say that he is tired and to focus on being comfortable and peaceful.   Emotional support provided.   Exam: Alert, oriented but poor insight and judgement. No distress. Frail, thin.   Plan: - Continue with aggressive care although he admits that he is unsure how long he can continue like this.   82 min  Vinie Sill, NP Palliative Medicine Team Pager # [phone] (M-F 8a-5p) Team Phone # [phone] (Nights/Weekends)

## 2018-09-04 NOTE — Progress Notes (Signed)
. Patient ID: Kyle Conrad, male   DOB: 24-Feb-1966, 53 y.o.   MRN: [id number]     Advanced Heart Failure Rounding Note  PCP-Cardiologist: No primary care provider on file.   Subjective:    Kyle Conrad stopped 4/19 and given some IVF due to low CVP and hypotension.   Feels ok. Remains weak. Denies SOB, orthopnea or PND. Says he wants to continue to fight.   CVP remains ~5  Objective:   Weight Range: 68.2 kg Body mass index is 19.83 kg/m.   Vital Signs:   Temp:  [97.4 F (36.3 C)-98.2 F (36.8 C)] 98.2 F (36.8 C) (04/21 0335) Pulse Rate:  [77-101] 101 (04/21 0335) Resp:  [15-27] 15 (04/21 0323) BP: (101-125)/(73-86) 105/73 (04/21 0335) SpO2:  [97 %-100 %] 97 % (04/21 0335) Weight:  [68.2 kg] 68.2 kg (04/21 0554) Last BM Date: 09/03/18  Weight change: Filed Weights   09/02/18 0454 09/03/18 0519 09/04/18 0554  Weight: 72.3 kg 83.9 kg 68.2 kg    Intake/Output:   Intake/Output Summary (Last 24 hours) at 09/04/2018 0938 Last data filed at 09/04/2018 0346 Gross per 24 hour  Intake 270 ml  Output 1875 ml  Net -1605 ml      Physical Exam   General:  Lying flat in bed. Weak and cachetic appearing   No resp difficulty HEENT: normal x for temporal wasting  Neck: supple. no JVD. Carotids 2+ bilat; no bruits. No lymphadenopathy or thryomegaly appreciated. Cor: PMI laterally displaced. Regular rate & rhythm. No rubs, gallops or murmurs. Lungs: clear Abdomen: soft, nontender, nondistended. No hepatosplenomegaly. No bruits or masses. Good bowel sounds. Extremities: no cyanosis, clubbing, rash, edema + multiple wounds with dressings Neuro: alert & orientedx3, cranial nerves grossly intact. moves all 4 extremities w/o difficulty. Affect pleasant   Telemetry   NSR 90ss Personally reviewed   Labs    CBC Recent Labs    09/03/18 0506 09/04/18 0331  WBC 6.5 8.2  NEUTROABS 4.7 5.2  HGB 8.1* 7.8*  HCT 25.4* 25.0*  MCV 90.1 91.2  PLT 311 299   Basic Metabolic Panel  Recent Labs    09/03/18 0506 09/04/18 0331  NA 131* 133*  K 5.4* 4.5  CL 94* 99  CO2 30 25  GLUCOSE 70 72  BUN 33* 28*  CREATININE 0.94 0.85  CALCIUM 8.2* 8.4*   Liver Function Tests No results for input(s): AST, ALT, ALKPHOS, BILITOT, PROT, ALBUMIN in the last 72 hours. No results for input(s): LIPASE, AMYLASE in the last 72 hours. Cardiac Enzymes No results for input(s): CKTOTAL, CKMB, CKMBINDEX, TROPONINI in the last 72 hours.  BNP: BNP (last 3 results) Recent Labs    03/24/18 1435 06/05/18 1411 08/16/18 1611  BNP 2,471.9* 3,961.4* 2,433.3*    ProBNP (last 3 results) No results for input(s): PROBNP in the last 8760 hours.   D-Dimer No results for input(s): DDIMER in the last 72 hours. Hemoglobin A1C No results for input(s): HGBA1C in the last 72 hours. Fasting Lipid Panel No results for input(s): CHOL, HDL, LDLCALC, TRIG, CHOLHDL, LDLDIRECT in the last 72 hours. Thyroid Function Tests No results for input(s): TSH, T4TOTAL, T3FREE, THYROIDAB in the last 72 hours.  Invalid input(s): FREET3  Other results:   Imaging    No results found.   Medications:     Scheduled Medications: . Chlorhexidine Gluconate Cloth  6 each Topical Daily  . collagenase   Topical Daily  . digoxin  0.125 mg Oral Daily  . feeding supplement (ENSURE  ENLIVE)  237 mL Oral TID BM  . feeding supplement (PRO-STAT SUGAR FREE 64)  30 mL Oral BID  . Gerhardt's butt cream   Topical Daily  . heparin injection (subcutaneous)  5,000 Units Subcutaneous Q8H  . isosorbide-hydrALAZINE  0.5 tablet Oral TID  . mouth rinse  15 mL Mouth Rinse q12n4p  . pantoprazole  40 mg Oral Daily  . polyethylene glycol  17 g Oral Daily  . sodium chloride flush  10-40 mL Intracatheter Q12H  . thiamine  100 mg Per Tube Daily    Infusions: . sodium chloride 250 mL (08/28/18 0014)    PRN Medications: acetaminophen (TYLENOL) oral liquid 160 mg/5 mL, lip balm, ondansetron (ZOFRAN) IV, senna, sodium  chloride flush   Assessment/Plan   1. 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  He is markedly volume overloaded with suspected mixed septic/cardiogenic shock.  Echo 4/3 with severe biventricular failure, LVEF 20-25% small effusion, moderate to severe MR and severe TR.  He is now off dobutamine.   BUN/creatinine stable off CVVH. - CVP 5 Co-ox 33% - Weight way down yesterday after increasing Kyle Conrad to 40 bid (home dose). Kyle Conrad on hold. Continue to hold Kyle Conrad today. Would resume torsemdie 60 daily at d/c. - Continue Bidil 1/2 tab tid.  Hold SBP < 95 - Continue digoxin 0.125 daily. Level 0.3 - Off spiro due to hyperkalemia. You received Lokelma yesterday.  - No b-blocker with recent shock. No ACE/ARB/ARN with recent AKI - He is not going to be a candidate for advanced therapies.  LVAD likely not an option regardless with severe RV dysfunction and has had very poor compliance with medical therapy.   2. ID: Suspect component of septic shock, PCT 6.76.  Citrobacter freundii in blood initially. ?Source from lower leg/foot skin breakdown or stool. Febrile again am 4/9.  Afebrile today with normal WBCs.  CXR with LLL atelectasis versus infiltrate. Repeat cultures NGTD.  - Antibiotics broadened again on 4/9, currently on vancomycin/cefepime.  - Narrowed back to ceftriaxone 4/12. Stopped 4/15 - Most recent cultures remain negative.   3. AKI: Concern for ATN in setting of shock.  Was down at home for an undetermined length of time.  Initially required CVVH, now off. Resolved.  - Management of diuretics as above   4. Thrombocytopenia: Noted at admission, normal plts in the past.  Suspect related to sepsis (normal fibrinogen, no schistocytes in blood).  Transfused 1 unit plts 4/5 and again on 4/6.   - Resolved.    5. Hypoglyemia: Resolved.      6. Neuro: Confusion/delirium, likely in setting of critical illness/infection. Head CT 4/4 with no acute changes. Passed swallow study.  Aphonia resolved.  - improved  7. Anemia - Received 1 unit PRBCs on 08/30/18. Hgb stable today 7.8  8. Vascular: ABIs normal.  Suspect skin breakdown on foot related to venous stasis and marked swelling initially.     9. FEN:  - severe protein calorie malnutrition. Received short term tube feeds with Cor-trak which was removed 4/14.   10. Hyperkalemia  - spiro off. K 4.5  11. LE wounds and sacral decub - WOC has seen for d/c recommendations.  - Stressed need to use bedside commode and urinal and not diaper   12  Deconditioning: Severe.  Out of bed, PT/OT.  Will need SNF - see below.   13. Disposition -  Prognosis quite poor. He is ready for SNF placement when bed available. This is only remaining issue.  - He is ready for SNF but no bed available. D/W RN staff who will discuss with SW>    Length of Stay: 19  Glori Bickers, MD  9:38 AM  Advanced Heart Failure Team Pager [phone] (M-F; 7a - 4p)  Please contact [facility] for night-coverage after hours (4p -7a ) and weekends on [email or web address]

## 2018-09-04 NOTE — TOC Transition Note (Addendum)
Transition of Care [facility]) - CM/SW Discharge Note   Patient Details  Name: Kyle Conrad MRN: [id number] Date of Birth: [birth year]  [facility] CM/SW Contact:  Vinie Sill, [place] Phone Number:  09/04/2018, 2:41 PM   Clinical Narrative:    [facility] declined placement- no current bed offers- CSW will continue to follow and pursue  placement CSW Assistant Director is aware of placement barriers.  CSW spoke with the patient's friend, Dewaine Oats and inquired about support system. She explained she has been friends with the patient for years and the patient  did not associate with many people. She is unaware of any other local  friends or family able to support the patient. She is unable to provide the support the patient needs after discharge.    Thurmond Butts, MSW, LCSWA Clinical Social Worker [phone]     Barriers to Discharge: SNF Pending payor source - LOG, SNF Pending bed offer   Patient Goals and CMS Choice Patient states their goals for this hospitalization and ongoing recovery are:: go to rehab and then go home      Discharge Placement                       Discharge Plan and Services                          Social Determinants of Health (SDOH) Interventions     Readmission Risk Interventions Readmission Risk Prevention Plan 08/28/2018  Transportation Screening Complete  PCP or Specialist Appt within 3-5 Days Complete  HRI or [place] Complete  Social Work Consult for [place] Planning/Counseling Complete  Palliative Care Screening Not Applicable  Medication Review Press photographer) Complete  Some recent data might be hidden

## 2018-09-04 NOTE — Progress Notes (Signed)
RN and NT encouraged patient to get up to chair while standing for weight.  Patient states that he does not want to sit in the chair at this time. Patient back in bed.  Will continue to monitor.

## 2018-09-04 NOTE — Progress Notes (Signed)
Nutrition Follow-up  RD working remotely.  DOCUMENTATION CODES:   Non-severe (moderate) malnutrition in context of chronic illness  INTERVENTION:  - Recommend liberalizing diet to REGULAR to promote increase po intake as pt has reported the food does not taste good  - Continue Ensure Enlive po TID, each supplement provides 350 kcal and 20 grams of protein  - Continue Pro-stat 30 ml po BID, each supplement provides 100 kcal and 15 grams of protein  - Continue Magic Cup TID with meals, each supplement provides 290 kcal and 9 grams of protein  - MVI with minerals daily  - Gravy with meats at meals  - Encourage adequate PO intake  NUTRITION DIAGNOSIS:   Moderate Malnutrition related to chronic illness (CHF) as evidenced by moderate fat depletion, moderate muscle depletion, severe muscle depletion.  Ongoing, being addressed via oral nutrition supplements  GOAL:   Patient will meet greater than or equal to 90% of their needs  Progressing  MONITOR:   PO intake, Supplement acceptance, Labs, Skin, I & O's, Weight trends  REASON FOR ASSESSMENT:   Consult Assessment of nutrition requirement/status  ASSESSMENT:   53 year old male who presented to the ED on 4/2 with AMS and weakness. PMH of CHF, HTN. Pt admitted with decompensated biventricular heart failure and shock.  4/03 - CRRT initiated 4/04 - SLP evaluation with recommendations for regular diet and thin liquids 4/06 - Cortrak placed and TF initiated 4/08 - CRRT off 4/13 - transitioned to bolus TF 4/14 - Cortrak removed  Spoke with pt via phone call to room. Pt in good spirits, states that his appetite is "still kind of slow" but sometimes it gets better and sometimes it doesn't. Pt states, "it just depends on the time of day." Pt reports that typically he is hungrier in the morning.  Pt reports that he is enjoying the Ensure Enlive supplements and consuming "at lest 2-3" daily. Pt also states that he is  consuming the Pro-stat protein supplements.  Pt reports that he did "okay" with lunch and had chicken broccoli, and rice. Pt reports that "by the time I get to my food, it gets cold." Pt reports that he is often delayed in getting to eat his meals due to "people being in the room or they're changing my dressings." Pt states that he has been told his food cannot be reheated once it enters his room. Pt states that he would eat more if his food was warm. Encouraged pt to try cold options including sandwiches, cottage cheese and fruit plate, etc. to see if he likes those.  Pt also reports that his portion sizes at meals are too large and that his stomach "starts to feel bad" if he eats "too much." RD encouraged adequate PO intake and encouraged pt to reach out for medication if he experiences nausea. Encouraged pt to take meals slowly.  Pt confirms that nutritional services are coming to his room to take his meal orders and that he is able to find dishes that he likes.  Reviewed social work note. Pt has no bed offers and placement barriers include no insurance and no local family support. Per MD note this AM, pt is ready for SNF placement when bed available.  Weight continues to trend down. Down 49 lbs since 4/2. Weights extremely variable, ranging from 150-246 lbs since admission.  Meal Completion: - 4/16: 60%, 75%, 65% - 4/17: 50%, 60% - 4/18: 40% - 4/19 and 4/20: no data available - 4/21: 20%, 30%  Medications reviewed and include: Ensure Enlive TID, Pro-stat 30 ml BID, Protonix, Miralax, thiamine  Labs reviewed: sodium 133 (L), BUN 28 (H), hemoglobin 7.8 (L)  UOP: 2075 ml x 24 hours I/O's: -34.5 L since admit  Diet Order:   Diet Order            Diet 2 gram sodium Room service appropriate? Yes; Fluid consistency: Thin  Diet effective now              EDUCATION NEEDS:   Education needs have been addressed  Skin:  Skin Assessment: Skin Integrity Issues: DTI: left buttocks,  left thigh Other: non-pressure wound to scrotum, weeping from BLE  Last BM:  09/03/18 small type 6  Height:   Ht Readings from Last 1 Encounters:  08/23/18 6\' 1"  (1.854 m)    Weight:   Wt Readings from Last 1 Encounters:  09/04/18 68.2 kg    Ideal Body Weight:  80.9 kg  BMI:  Body mass index is 19.83 kg/m.  Estimated Nutritional Needs:   Kcal:  2100-2300  Protein:  110-125 grams  Fluid:  per MD    Gaynell Face, MS, RD, LDN Inpatient Clinical Dietitian Pager: [phone] Weekend/After Hours: [phone]

## 2018-09-04 NOTE — TOC Progression Note (Signed)
Transition of Care [facility]) - Progression Note    Patient Details  Name: Kyle Conrad MRN: [id number] Date of Birth: 1965/12/01  Transition of Care [facility]) CM/SW [place], [place] Phone Number: 09/04/2018, 8:57 AM  Clinical Narrative:    [facility] declined-Patient has no bed offers-placement barriers is no insurance and no local family support. Patient's cousin, Kyle Conrad and Kyle Conrad lives in [place]. Patient lives alone and has no local family support. Patient's family has report the patient's ex-girl friend, Kyle Conrad has been supportive. CSW called Kyle Conrad but unable to reach her- left voice message requesting a return call. CSW will continue to follow.     Expected Discharge Plan: Skilled Nursing Facility Barriers to Discharge: SNF Pending payor source - LOG, SNF Pending bed offer  Expected Discharge Plan and Services Expected Discharge Plan: [place]                                   Social Determinants of Health (SDOH) Interventions    Readmission Risk Interventions Readmission Risk Prevention Plan 08/28/2018  Transportation Screening Complete  PCP or Specialist Appt within 3-5 Days Complete  HRI or [place] Complete  Social Work Consult for [place] Planning/Counseling Complete  Palliative Care Screening Not Applicable  Medication Review Press photographer) Complete  Some recent data might be hidden

## 2018-09-05 LAB — CBC WITH DIFFERENTIAL/PLATELET
Abs Immature Granulocytes: 0.48 10*3/uL — ABNORMAL HIGH (ref 0.00–0.07)
Basophils Absolute: 0.1 10*3/uL (ref 0.0–0.1)
Basophils Relative: 1 %
Eosinophils Absolute: 0.2 10*3/uL (ref 0.0–0.5)
Eosinophils Relative: 3 %
HCT: 25.7 % — ABNORMAL LOW (ref 39.0–52.0)
Hemoglobin: 8.2 g/dL — ABNORMAL LOW (ref 13.0–17.0)
Immature Granulocytes: 7 %
Lymphocytes Relative: 18 %
Lymphs Abs: 1.3 10*3/uL (ref 0.7–4.0)
MCH: 29.4 pg (ref 26.0–34.0)
MCHC: 31.9 g/dL (ref 30.0–36.0)
MCV: 92.1 fL (ref 80.0–100.0)
Monocytes Absolute: 0.7 10*3/uL (ref 0.1–1.0)
Monocytes Relative: 10 %
Neutro Abs: 4.5 10*3/uL (ref 1.7–7.7)
Neutrophils Relative %: 61 %
Platelets: 346 10*3/uL (ref 150–400)
RBC: 2.79 MIL/uL — ABNORMAL LOW (ref 4.22–5.81)
RDW: 17.8 % — ABNORMAL HIGH (ref 11.5–15.5)
WBC: 7.5 10*3/uL (ref 4.0–10.5)
nRBC: 0.4 % — ABNORMAL HIGH (ref 0.0–0.2)

## 2018-09-05 LAB — BASIC METABOLIC PANEL
Anion gap: 9 (ref 5–15)
BUN: 26 mg/dL — ABNORMAL HIGH (ref 6–20)
CO2: 25 mmol/L (ref 22–32)
Calcium: 8.5 mg/dL — ABNORMAL LOW (ref 8.9–10.3)
Chloride: 101 mmol/L (ref 98–111)
Creatinine, Ser: 0.85 mg/dL (ref 0.61–1.24)
GFR calc Af Amer: >60 mL/min (ref >60)
GFR calc non Af Amer: >60 mL/min (ref >60)
Glucose, Bld: 94 mg/dL (ref 70–99)
Potassium: 4.6 mmol/L (ref 3.5–5.1)
Sodium: 135 mmol/L (ref 135–145)

## 2018-09-05 LAB — TROPONIN I
Troponin I: <0.03 ng/mL (ref <0.03)
Troponin I: <0.03 ng/mL (ref <0.03)

## 2018-09-05 LAB — COOXEMETRY PANEL
Carboxyhemoglobin: 1.2 % (ref 0.5–1.5)
Methemoglobin: 2 % — ABNORMAL HIGH (ref 0.0–1.5)
O2 Saturation: 75.2 %
Total hemoglobin: 9.1 g/dL — ABNORMAL LOW (ref 12.0–16.0)

## 2018-09-05 MED ORDER — FUROSEMIDE 10 MG/ML IJ SOLN
40.0000 mg | Freq: Once | INTRAMUSCULAR | Status: AC
  Administered 2018-09-05: 13:00:00 40 mg via INTRAVENOUS

## 2018-09-05 MED ORDER — FUROSEMIDE 10 MG/ML IJ SOLN
INTRAMUSCULAR | Status: AC
  Administered 2018-09-05: 13:00:00 40 mg via INTRAVENOUS
  Filled 2018-09-05: qty 4

## 2018-09-05 NOTE — Progress Notes (Signed)
Patient refusing dressing change at this time due to chest discomfort and mild SOB

## 2018-09-05 NOTE — Progress Notes (Addendum)
Paged by RN. Patient is SOB and having 8/10 chest pain. CVP 9. SBP 88. O2 sats stable on RA EKG shows more prominent TWI in V3-V5. Discussed with Dr Haroldine Laws. Will give 40 mg IV lasix. Will also cycle troponins. Can hold bidil as needed if BP remains soft. Hold parameters in.   Georgiana Shore, NP

## 2018-09-05 NOTE — TOC Progression Note (Signed)
Transition of Care [facility]) - Progression Note    Patient Details  Name: Kyle Conrad MRN: [id number] Date of Birth: 1965-06-25  Transition of Care [facility]) CM/SW [place], [place] Phone Number: 09/05/2018, 3:07 PM  Clinical Narrative:    Patient has no bed offers- CSW sent referral to [facility] and [facility]). CSW awaiting response- CSW will continue to follow and pursue placement.    Expected Discharge Plan: Skilled Nursing Facility Barriers to Discharge: SNF Pending payor source - [facility], SNF Pending bed offer  Expected Discharge Plan and Services Expected Discharge Plan: [place]                                   Social Determinants of Health (SDOH) Interventions    Readmission Risk Interventions Readmission Risk Prevention Plan 08/28/2018  Transportation Screening Complete  PCP or Specialist Appt within 3-5 Days Complete  HRI or [place] Complete  Social Work Consult for [place] Planning/Counseling Complete  Palliative Care Screening Not Applicable  Medication Review Press photographer) Complete  Some recent data might be hidden

## 2018-09-05 NOTE — Progress Notes (Signed)
Occupational Therapy Treatment Patient Details Name: Kyle Conrad MRN: [id number] DOB: Mar 27, 1966 Today's Date: 09/05/2018    History of present illness Pt is a 53 y.o. male admitted 08/16/18 with AMS after being found down by neighbor; treated for cardiogenic shock, septic shock with citrobacter bactermeia. Also with AKI; on CRRT 4/3-4/8. Has remained encephalopathic throughout admission. Head CT 4/4 with no acute changes. PMH includes biventricular HF, HTN.   OT comments  Pt agreeable only to ADL activities at EOB, stating he did not want to get to the chair, but would be willing in an hour. Performed grooming with set up/supervision and UB dressing with min assist. Talked with pt about current events and oriented to date and time, lights on and blinds up at end of session.  Follow Up Recommendations  SNF;Supervision/Assistance - 24 hour    Equipment Recommendations  Other (comment)(defer to next venue)    Recommendations for Other Services      Precautions / Restrictions Precautions Precautions: Fall Restrictions Weight Bearing Restrictions: No       Mobility Bed Mobility Overal bed mobility: Needs Assistance Bed Mobility: Supine to Sit;Sit to Supine     Supine to sit: Min assist Sit to supine: Min assist   General bed mobility comments: assist for trunk to sit EOB and for LEs back into bed  Transfers                 General transfer comment: declined OOB    Balance Overall balance assessment: Needs assistance   Sitting balance-[name]: Fair                                     ADL either performed or assessed with clinical judgement   ADL Overall ADL's : Needs assistance/impaired Eating/Feeding: Independent;Bed level   Grooming: Wash/dry hands;Wash/dry face;Oral care;Sitting;Supervision/safety;Set up           Upper Body Dressing : Minimal assistance;Sitting                           Vision       Perception      Praxis      Cognition Arousal/Alertness: Awake/alert Behavior During Therapy: Flat affect Overall Cognitive Status: No family/caregiver present to determine baseline cognitive functioning Area of Impairment: Orientation;Memory;Safety/judgement;Problem solving                 Orientation Level: Disoriented to;Time   Memory: Decreased short-term memory   Safety/Judgement: Decreased awareness of safety;Decreased awareness of deficits   Problem Solving: Slow processing;Decreased initiation;Requires verbal cues General Comments: pt pleasant, but only agreeable to EOB ADL, declined OOB to chair        Exercises     Shoulder Instructions       General Comments      Pertinent Vitals/ Pain       Pain Assessment: Faces Faces Pain Scale: Hurts little more Pain Location: abdomen Pain Descriptors / Indicators: Discomfort Pain Intervention(s): Monitored during session  Home Living                                          Prior Functioning/Environment              Frequency  Min 2X/week  Progress Toward Goals  OT Goals(current goals can now be found in the care plan section)  Progress towards OT goals: Progressing toward goals  Acute Rehab OT Goals Patient Stated Goal: get better at his walking OT Goal Formulation: With patient Time For Goal Achievement: 09/05/18 Potential to Achieve Goals: [place] Discharge plan remains appropriate;Frequency remains appropriate    Co-evaluation                 AM-PAC OT "6 Clicks" Daily Activity     Outcome Measure   Help from another person eating meals?: None Help from another person taking care of personal grooming?: A Little Help from another person toileting, which includes using toliet, bedpan, or urinal?: A Lot Help from another person bathing (including washing, rinsing, drying)?: A Lot Help from another person to put on and taking off regular upper body clothing?: A  Little Help from another person to put on and taking off regular lower body clothing?: A Lot 6 Click Score: 16    End of Session    OT Visit Diagnosis: Unsteadiness on feet (R26.81);Other abnormalities of gait and mobility (R26.89);Muscle weakness (generalized) (M62.81);Other symptoms and signs involving cognitive function;Adult, failure to thrive (R62.7)   Activity Tolerance Patient limited by fatigue   Patient Left in bed;with call bell/phone within reach   Nurse Communication          Time: 8502-7741 OT Time Calculation (min): 16 min  Charges: OT General Charges $OT Visit: 1 Visit OT Treatments $Self Care/Home Management : 8-22 mins  [name], OTR/L Acute Rehabilitation Services Pager: [phone] Office: [phone]   Malka So 09/05/2018, 10:53 AM

## 2018-09-05 NOTE — Progress Notes (Signed)
. Patient ID: Kyle Conrad, male   DOB: [birth year], 53 y.o.   MRN: [id number]     Advanced Heart Failure Rounding Note  PCP-Cardiologist: No primary care provider on file.   Subjective:    Torsemide stopped 4/19 and given some IVF due to low CVP and hypotension.   Remains off diuretics. Weight down 3 more pounds. Seen by Nutrition and recommended liberalizing diet due to poor intake. Met with Palliative. Wants to "continue fighting". Has not been able to get SNF bed offer. No family in community for him to live with   Feels OK. Remains weak. Denies SOB, orthopnea or PND.     Objective:   Weight Range: 66.8 kg Body mass index is 19.43 kg/m.   Vital Signs:   Temp:  [97.7 F (36.5 C)-98.6 F (37 C)] 98.6 F (37 C) (04/22 0745) Pulse Rate:  [74-153] 99 (04/22 0745) Resp:  [15-19] 19 (04/22 0745) BP: (86-126)/(49-82) 126/81 (04/22 0745) SpO2:  [95 %-100 %] 98 % (04/22 0745) Weight:  [66.8 kg] 66.8 kg (04/22 0300) Last BM Date: (P) 09/04/18  Weight change: Filed Weights   09/03/18 0519 09/04/18 0554 09/05/18 0300  Weight: 83.9 kg 68.2 kg 66.8 kg    Intake/Output:   Intake/Output Summary (Last 24 hours) at 09/05/2018 0901 Last data filed at 09/05/2018 0800 Gross per 24 hour  Intake 750 ml  Output 1900 ml  Net -1150 ml      Physical Exam   General:  Lying flat in bed. Weak and cachetic appearing   No resp difficulty HEENT: normal x temporal wasting  Neck: supple. no JVD. Carotids 2+ bilat; no bruits. No lymphadenopathy or thryomegaly appreciated. Cor: PMI nondisplaced. Regular rate & rhythm. No rubs, gallops or murmurs. Lungs: clear Abdomen: soft, nontender, nondistended. No hepatosplenomegaly. No bruits or masses. Good bowel sounds. Extremities: no cyanosis, clubbing, rash, edema diffuse skin wounds with dressings  Neuro: alert & orientedx3, cranial nerves grossly intact. moves all 4 extremities w/o difficulty. Affect pleasant    Telemetry   NSR 90-100  Personally reviewed   Labs    CBC Recent Labs    09/04/18 0331 09/05/18 0310  WBC 8.2 7.5  NEUTROABS 5.2 4.5  HGB 7.8* 8.2*  HCT 25.0* 25.7*  MCV 91.2 92.1  PLT 352 425   Basic Metabolic Panel Recent Labs    09/04/18 0331 09/05/18 0310  NA 133* 135  K 4.5 4.6  CL 99 101  CO2 25 25  GLUCOSE 72 94  BUN 28* 26*  CREATININE 0.85 0.85  CALCIUM 8.4* 8.5*   Liver Function Tests No results for input(s): AST, ALT, ALKPHOS, BILITOT, PROT, ALBUMIN in the last 72 hours. No results for input(s): LIPASE, AMYLASE in the last 72 hours. Cardiac Enzymes No results for input(s): CKTOTAL, CKMB, CKMBINDEX, TROPONINI in the last 72 hours.  BNP: BNP (last 3 results) Recent Labs    03/24/18 1435 06/05/18 1411 08/16/18 1611  BNP 2,471.9* 3,961.4* 2,433.3*    ProBNP (last 3 results) No results for input(s): PROBNP in the last 8760 hours.   D-Dimer No results for input(s): DDIMER in the last 72 hours. Hemoglobin A1C No results for input(s): HGBA1C in the last 72 hours. Fasting Lipid Panel No results for input(s): CHOL, HDL, LDLCALC, TRIG, CHOLHDL, LDLDIRECT in the last 72 hours. Thyroid Function Tests No results for input(s): TSH, T4TOTAL, T3FREE, THYROIDAB in the last 72 hours.  Invalid input(s): FREET3  Other results:   Imaging    No results found.  Medications:     Scheduled Medications: . Chlorhexidine Gluconate Cloth  6 each Topical Daily  . collagenase   Topical Daily  . digoxin  0.125 mg Oral Daily  . feeding supplement (ENSURE ENLIVE)  237 mL Oral TID BM  . feeding supplement (PRO-STAT SUGAR FREE 64)  30 mL Oral BID  . Gerhardt's butt cream   Topical Daily  . heparin injection (subcutaneous)  5,000 Units Subcutaneous Q8H  . isosorbide-hydrALAZINE  0.5 tablet Oral TID  . mouth rinse  15 mL Mouth Rinse q12n4p  . multivitamin with minerals  1 tablet Oral Daily  . pantoprazole  40 mg Oral Daily  . polyethylene glycol  17 g Oral Daily  . sodium chloride  flush  10-40 mL Intracatheter Q12H  . thiamine  100 mg Per Tube Daily    Infusions: . sodium chloride 250 mL (08/28/18 0014)    PRN Medications: acetaminophen (TYLENOL) oral liquid 160 mg/5 mL, acetaminophen, lip balm, ondansetron (ZOFRAN) IV, senna, sodium chloride flush   Assessment/Plan   1. 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  He is markedly volume overloaded with suspected mixed septic/cardiogenic shock.  Echo 4/3 with severe biventricular failure, LVEF 20-25% small effusion, moderate to severe MR and severe TR.  He is now off dobutamine.   BUN/creatinine stable off CVVH. - CVP low Co-ox 75% - Weight down. Torsemide on hold. Continue to hold torsemide today.  - Continue Bidil 1/2 tab tid.  Hold SBP < 95 - Continue digoxin 0.125 daily. Level 0.3 - Off spiro due to hyperkalemia. You received Lokelma yesterday.  - No b-blocker with recent shock. No ACE/ARB/ARN with recent AKI - He is not going to be a candidate for advanced therapies.  LVAD likely not an option regardless with severe RV dysfunction and has had very poor compliance with medical therapy.   2. ID: Suspect component of septic shock, PCT 6.76.  Citrobacter freundii in blood initially. ?Source from lower leg/foot skin breakdown or stool. Febrile again am 4/9.  Afebrile today with normal WBCs.  CXR with LLL atelectasis versus infiltrate. Repeat cultures NGTD.  - Antibiotics broadened again on 4/9, currently on vancomycin/cefepime.  - Narrowed back to ceftriaxone 4/12. Stopped 4/15 - Most recent cultures remain negative.   3. AKI: Concern for ATN in setting of shock.  Was down at home for an undetermined length of time.  Initially required CVVH, now off. Resolved.  - Management of diuretics as above   4. Thrombocytopenia: Noted at admission, normal plts in the past.   Suspect related to sepsis (normal fibrinogen, no schistocytes in blood).  Transfused 1 unit plts 4/5 and again on 4/6.   - Resolved.    5. Hypoglyemia: Resolved.     6. Neuro: Confusion/delirium, likely in setting of critical illness/infection. Head CT 4/4 with no acute changes. Passed swallow study.  Aphonia resolved.  - improved  7. Anemia - Received 1 unit PRBCs on 08/30/18. Hgb stable today 8.2  8. Vascular: ABIs normal.  Suspect skin breakdown on foot related to venous stasis and marked swelling initially.     9. FEN:  - severe protein calorie malnutrition. Received short term tube feeds with Cor-trak which was removed 4/14.  -  Nutrition has seen yesterday  and recommended liberalizing diet due to poor intake. \  10. Hyperkalemia  - spiro off. K 4.6  11.  LE wounds and sacral decub - WOC has seen for d/c recommendations.  - Stressed need to use bedside commode and urinal and not diaper   12  Deconditioning: Severe.  Out of bed, PT/OT.  Will need SNF - see below.   13. Disposition - Prognosis quite poor. He is ready for SNF placement when bed available. This is only remaining issue.  - He is ready for SNF but no bed available. Will continue to discuss with SW about options   Length of Stay: 20  Glori Bickers, MD  9:01 AM  Advanced Heart Failure Team Pager [phone] (M-F; 7a - 4p)  Please contact [facility] for night-coverage after hours (4p -7a ) and weekends on [email or web address]

## 2018-09-06 LAB — CBC WITH DIFFERENTIAL/PLATELET
Abs Immature Granulocytes: 0.6 10*3/uL — ABNORMAL HIGH (ref 0.00–0.07)
Band Neutrophils: 2 %
Basophils Absolute: 0 10*3/uL (ref 0.0–0.1)
Basophils Relative: 0 %
Blasts: 1 %
Eosinophils Absolute: 0.4 10*3/uL (ref 0.0–0.5)
Eosinophils Relative: 4 %
HCT: 25.5 % — ABNORMAL LOW (ref 39.0–52.0)
Hemoglobin: 8.1 g/dL — ABNORMAL LOW (ref 13.0–17.0)
Lymphocytes Relative: 7 %
Lymphs Abs: 0.6 10*3/uL — ABNORMAL LOW (ref 0.7–4.0)
MCH: 28.6 pg (ref 26.0–34.0)
MCHC: 31.8 g/dL (ref 30.0–36.0)
MCV: 90.1 fL (ref 80.0–100.0)
Metamyelocytes Relative: 2 %
Monocytes Absolute: 0.5 10*3/uL (ref 0.1–1.0)
Monocytes Relative: 5 %
Myelocytes: 4 %
Neutro Abs: 7 10*3/uL (ref 1.7–7.7)
Neutrophils Relative %: 74 %
Platelets: 362 10*3/uL (ref 150–400)
Promyelocytes Relative: 1 %
RBC: 2.83 MIL/uL — ABNORMAL LOW (ref 4.22–5.81)
RDW: 17.9 % — ABNORMAL HIGH (ref 11.5–15.5)
WBC: 9.2 10*3/uL (ref 4.0–10.5)
nRBC: 0 /100 WBC
nRBC: 0.5 % — ABNORMAL HIGH (ref 0.0–0.2)

## 2018-09-06 LAB — TROPONIN I: Troponin I: <0.03 ng/mL (ref <0.03)

## 2018-09-06 LAB — GLUCOSE, CAPILLARY: Glucose-Capillary: 118 mg/dL — ABNORMAL HIGH (ref 70–99)

## 2018-09-06 LAB — COOXEMETRY PANEL
Carboxyhemoglobin: 1.3 % (ref 0.5–1.5)
Methemoglobin: 1.8 % — ABNORMAL HIGH (ref 0.0–1.5)
O2 Saturation: 74.6 %
Total hemoglobin: 8.2 g/dL — ABNORMAL LOW (ref 12.0–16.0)

## 2018-09-06 LAB — BASIC METABOLIC PANEL
Anion gap: 10 (ref 5–15)
BUN: 30 mg/dL — ABNORMAL HIGH (ref 6–20)
CO2: 25 mmol/L (ref 22–32)
Calcium: 8.4 mg/dL — ABNORMAL LOW (ref 8.9–10.3)
Chloride: 98 mmol/L (ref 98–111)
Creatinine, Ser: 1.02 mg/dL (ref 0.61–1.24)
GFR calc Af Amer: >60 mL/min (ref >60)
GFR calc non Af Amer: >60 mL/min (ref >60)
Glucose, Bld: 91 mg/dL (ref 70–99)
Potassium: 4.7 mmol/L (ref 3.5–5.1)
Sodium: 133 mmol/L — ABNORMAL LOW (ref 135–145)

## 2018-09-06 MED ORDER — TORSEMIDE 20 MG PO TABS: 20.0000 mg | ORAL_TABLET | ORAL | Status: DC

## 2018-09-06 NOTE — Progress Notes (Addendum)
Nutrition Follow-up  RD working remotely.  DOCUMENTATION CODES:   Non-severe (moderate) malnutrition in context of chronic illness  INTERVENTION:   - Continue with Regular diet  - ContinueEnsure Enlive poTID, each supplement provides 350 kcal and 20 grams of protein  - Continue Pro-stat 30 ml po BID, each supplement provides 100 kcal and 15 grams of protein  - ContinueMagicCup TID with meals, each supplement provides 290 kcal and 9 grams of protein  - Continue MVI with minerals daily  - Gravy with meats at meals  - Encourage adequate PO intake  NUTRITION DIAGNOSIS:   Moderate Malnutrition related to chronic illness (CHF) as evidenced by moderate fat depletion, moderate muscle depletion, severe muscle depletion.  Ongoing, being addressed via oral nutrition supplements and diet advancement  GOAL:   Patient will meet greater than or equal to 90% of their needs  Progressing  MONITOR:   PO intake, Supplement acceptance, Labs, Skin, I & O's, Weight trends  REASON FOR ASSESSMENT:   Consult Assessment of nutrition requirement/status  ASSESSMENT:   53 year old male who presented to the ED on 4/2 with AMS and weakness. PMH of CHF, HTN. Pt admitted with decompensated biventricular heart failure and shock.  4/03 - CRRT initiated 4/04 - SLP evaluation with recommendations for regular diet and thin liquids 4/06 - Cortrak placed and TF initiated 4/08 - CRRT off 4/13 - transitioned to bolus TF 4/14 - Cortrak removed  Reviewed palliative care team note from 4/21. Plan is to continue with aggressive care "although he admits that he is unsure how long he can continue like this." Pt does not have SNF bed offers as of 4/22.  Noted pt had an episode of SOB and chest pain on 4/22.  Meal Completion: - 4/21: 20% at breakfast, 30% at lunch, no dinner data - 4/22: 20% at breakfast, 50% at lunch, no dinner data - 4/23: 75% at breakfast  Weight continues to trend down. Pt  has gone from overweight BMI on admission almost to underweight BMI now with weight loss, some of which can be explained by negative fluid balance. Suspect moderate malnutrition has progressed to severe malnutrition but unable to confirm at this time without repeating NFPE.  Attempted to speak with pt via phone call to room but pt did not answer on multiple attempts.  Was able to speak with RN via phone call. RN reports pt is not ordering enough at breakfast and only ordered English muffin and oatmeal. Per RN, pt was still hungry after breakfast so he began eating graham crackers. RN reports pt has consumed two Ensure Enlive supplements so far today. RN reports pt is picky with his foods.  RD called [facility] to see what pt had ordered for breakfast tomorrow. Per [facility] representative, pt ordered a blueberry muffin, coffee, and orange juice. RD added to meal order so that it will include fruit cup, yogurt, and cereal with whole milk. Will continue to monitor food choices at meals to ensure pt is ordering enough food.  Medications reviewed and include: Ensure Enlive TID, Pro-stat 30 ml BID, MVI with minerals daily, Protonix, Miralax, thiamine, torsemide 20 mg every M-W-F  Labs reviewed: sodium 133 (L), BUN 30 (H), hemoglobin 8.1 (L) CBG: 118  UOP: 3100 ml x 24 hours I/O's: -36.7 L since admit  Diet Order:   Diet Order            Diet regular Room service appropriate? Yes; Fluid consistency: Thin  Diet effective now  EDUCATION NEEDS:   Education needs have been addressed  Skin:  Skin Assessment: Skin Integrity Issues: DTI: left buttocks, left thigh Other: non-pressure wound to scrotum, weeping from BLE  Last BM:  09/05/18 large type 4  Height:   Ht Readings from Last 1 Encounters:  08/23/18 6\' 1"  (1.854 m)    Weight:   Wt Readings from Last 1 Encounters:  09/06/18 64.1 kg    Ideal Body Weight:  80.9 kg  BMI:  Body mass index is 18.64 kg/m.  Estimated Nutritional  Needs:   Kcal:  2000-2200 (31-34 kcal/kg actual body weight)  Protein:  110-125 grams  Fluid:  per MD    Gaynell Face, MS, RD, LDN Inpatient Clinical Dietitian Pager: [phone] Weekend/After Hours: [phone]

## 2018-09-06 NOTE — Progress Notes (Signed)
**Note Kyle-Identified via Obfuscation** Physical Therapy Treatment Patient Details Name: Kyle Conrad MRN: [id number] DOB: 01/24/1966 Today's Date: 09/06/2018    History of Present Illness Pt is a 53 y.o. male admitted 08/16/18 with AMS after being found down by neighbor; treated for cardiogenic shock, septic shock with citrobacter bactermeia. Also with AKI; on CRRT 4/3-4/8. Has remained encephalopathic throughout admission. Head CT 4/4 with no acute changes. PMH includes biventricular HF, HTN.    PT Comments    Pt willing to participate without encouragement today.  Emphasized transition to EOB, sit to stand technique, and progressing ambulation.   Follow Up Recommendations  SNF;Supervision/Assistance - 24 hour     Equipment Recommendations  Other (comment)(TBA)    Recommendations for Other Services       Precautions / Restrictions Precautions Precautions: Fall    Mobility  Bed Mobility Overal bed mobility: Needs Assistance Bed Mobility: Supine to Sit     Supine to sit: Min guard     General bed mobility comments: up via right elbow.  slow and guarded  Transfers Overall transfer level: Needs assistance Equipment used: Rolling walker (2 wheeled) Transfers: Sit to/from Stand Sit to Stand: Min assist         General transfer comment: cues for better hand placement, assist to come forward.  Ambulation/Gait Ambulation/Gait assistance: Min guard Gait Distance (Feet): 180 Feet Assistive device: Rolling walker (2 wheeled) Gait Pattern/deviations: Step-through pattern Gait velocity: slower   General Gait Details: Still tentative, mildly unsteady, mild stagger reaching for door opening button.  Slower with little ability to increase speed yet.   Stairs             Wheelchair Mobility    Modified Rankin (Stroke Patients Only)       Balance Overall balance assessment: Needs assistance   Sitting balance-[name]: Fair       Standing balance-[name]: Poor                               Cognition Arousal/Alertness: Awake/alert Behavior During Therapy: Flat affect Overall Cognitive Status: (still impaired, but functional for therapy task)                                        Exercises      General Comments        Pertinent Vitals/Pain Pain Assessment: Faces Faces Pain Scale: Hurts even more Pain Location: Left leg wound Pain Descriptors / Indicators: Grimacing;Moaning;Guarding Pain Intervention(s): Monitored during session    Home Living                      Prior Function            PT Goals (current goals can now be found in the care plan section) Acute Rehab PT Goals Patient Stated Goal: get better at his walking PT Goal Formulation: With patient Time For Goal Achievement: 09/05/18 Potential to Achieve Goals: Fair Progress towards PT goals: Progressing toward goals    Frequency    Min 2X/week      PT Plan Current plan remains appropriate    Co-evaluation              AM-PAC PT "6 Clicks" Mobility   Outcome Measure  Help needed turning from your back to your side while in a flat bed without using bedrails?: A  Little Help needed moving from lying on your back to sitting on the side of a flat bed without using bedrails?: A Lot Help needed moving to and from a bed to a chair (including a wheelchair)?: A Lot Help needed standing up from a chair using your arms (e.g., wheelchair or bedside chair)?: A Little Help needed to walk in hospital room?: A Little Help needed climbing 3-5 steps with a railing? : A Lot 6 Click Score: 15    End of Session   Activity Tolerance: Patient tolerated treatment well Patient left: in chair;with call bell/phone within reach;with chair alarm set Nurse Communication: Mobility status PT Visit Diagnosis: Other abnormalities of gait and mobility (R26.89);Muscle weakness (generalized) (M62.81)     Time: 5681-2751 PT Time Calculation (min) (ACUTE ONLY): 17  min  Charges:  $Gait Training: 8-22 mins                     09/06/2018  Donnella Sham, PT Acute Rehabilitation Services [phone]  (pager) [phone]  (office)   Kyle Conrad 09/06/2018, 5:56 PM

## 2018-09-06 NOTE — TOC Progression Note (Signed)
Transition of Care [facility]) - Progression Note    Patient Details  Name: Kyle Conrad MRN: [id number] Date of Birth: 27-Jul-1965  Transition of Care [facility]) CM/SW [place], [place] Phone Number: [phone] 09/06/2018, 3:05 PM  Clinical Narrative:    Patient has no bed offers.  [place] declined patient. [place] [facility] Admission Coordinator/ Clarene Critchley will inform of decision tomorrow.   CSW went to visit with the patient but he was sleep.  Financial Counselor, Teacher, music, states she will  speak to the patient and Chartered loss adjuster and update CSW  on patient's Medicaid Screening status.   Thurmond Butts, MSW, [facility] Clinical Social Worker [phone]   Expected Discharge Plan: Skilled Nursing Facility Barriers to Discharge: SNF Pending payor source - LOG, SNF Pending bed offer  Expected Discharge Plan and Services Expected Discharge Plan: [place]                                               Social Determinants of Health (SDOH) Interventions    Readmission Risk Interventions Readmission Risk Prevention Plan 08/28/2018  Transportation Screening Complete  PCP or Specialist Appt within 3-5 Days Complete  HRI or [place] Complete  Social Work Consult for [place] Planning/Counseling Complete  Palliative Care Screening Not Applicable  Medication Review Press photographer) Complete  Some recent data might be hidden

## 2018-09-06 NOTE — Progress Notes (Signed)
. Patient ID: Kyle Conrad, male   DOB: August 29, 1965, 53 y.o.   MRN: [id number]     Advanced Heart Failure Rounding Note  PCP-Cardiologist: No primary care provider on file.   Subjective:    Has been off diuretics. Yesterday developed CP and SOB. CVP reported as 9 (was lower in the am). Received IV lasix.   Feels better today. Troponins normal. Feels ok this am. Remains weak. No orthopnea or PND. Co-ox 75% Creatinine stable    Objective:   Weight Range: 64.1 kg Body mass index is 18.64 kg/m.   Vital Signs:   Temp:  [98.5 F (36.9 C)-99.2 F (37.3 C)] 98.5 F (36.9 C) (04/23 0716) Pulse Rate:  [80-99] 99 (04/23 0716) Resp:  [16-24] 20 (04/23 0300) BP: (83-108)/(58-81) 108/80 (04/23 0716) SpO2:  [97 %-100 %] 97 % (04/23 0716) Weight:  [64.1 kg] 64.1 kg (04/23 0500) Last BM Date: 09/04/18  Weight change: Filed Weights   09/04/18 0554 09/05/18 0300 09/06/18 0500  Weight: 68.2 kg 66.8 kg 64.1 kg    Intake/Output:   Intake/Output Summary (Last 24 hours) at 09/06/2018 0813 Last data filed at 09/06/2018 0544 Gross per 24 hour  Intake 1550 ml  Output 2700 ml  Net -1150 ml      Physical Exam   General:  Lying flat in bed. Weak and cachetic appearing   No resp difficulty HEENT: normal + temporal wasting Neck: supple. no JVD. Carotids 2+ bilat; no bruits. No lymphadenopathy or thryomegaly appreciated. Cor: PMI laterally displaced. Regular rate & rhythm. No rubs, gallops or murmurs. Lungs: clear Abdomen: soft, nontender, nondistended. No hepatosplenomegaly. No bruits or masses. Good bowel sounds. Extremities: no cyanosis, clubbing, rash, edema Neuro: alert & orientedx3, cranial nerves grossly intact. moves all 4 extremities w/o difficulty. Affect pleasant   Telemetry   NSR 90-100 Personally reviewed   Labs    CBC Recent Labs    09/05/18 0310 09/06/18 0100  WBC 7.5 9.2  NEUTROABS 4.5 7.0  HGB 8.2* 8.1*  HCT 25.7* 25.5*  MCV 92.1 90.1  PLT 346 786   Basic  Metabolic Panel Recent Labs    09/05/18 0310 09/06/18 0100  NA 135 133*  K 4.6 4.7  CL 101 98  CO2 25 25  GLUCOSE 94 91  BUN 26* 30*  CREATININE 0.85 1.02  CALCIUM 8.5* 8.4*   Liver Function Tests No results for input(s): AST, ALT, ALKPHOS, BILITOT, PROT, ALBUMIN in the last 72 hours. No results for input(s): LIPASE, AMYLASE in the last 72 hours. Cardiac Enzymes Recent Labs    09/05/18 1315 09/05/18 2030 09/06/18 0100  TROPONINI <0.03 <0.03 <0.03    BNP: BNP (last 3 results) Recent Labs    03/24/18 1435 06/05/18 1411 08/16/18 1611  BNP 2,471.9* 3,961.4* 2,433.3*    ProBNP (last 3 results) No results for input(s): PROBNP in the last 8760 hours.   D-Dimer No results for input(s): DDIMER in the last 72 hours. Hemoglobin A1C No results for input(s): HGBA1C in the last 72 hours. Fasting Lipid Panel No results for input(s): CHOL, HDL, LDLCALC, TRIG, CHOLHDL, LDLDIRECT in the last 72 hours. Thyroid Function Tests No results for input(s): TSH, T4TOTAL, T3FREE, THYROIDAB in the last 72 hours.  Invalid input(s): FREET3  Other results:   Imaging    No results found.   Medications:     Scheduled Medications: . Chlorhexidine Gluconate Cloth  6 each Topical Daily  . collagenase   Topical Daily  . digoxin  0.125 mg Oral  Daily  . feeding supplement (ENSURE ENLIVE)  237 mL Oral TID BM  . feeding supplement (PRO-STAT SUGAR FREE 64)  30 mL Oral BID  . Gerhardt's butt cream   Topical Daily  . heparin injection (subcutaneous)  5,000 Units Subcutaneous Q8H  . isosorbide-hydrALAZINE  0.5 tablet Oral TID  . mouth rinse  15 mL Mouth Rinse q12n4p  . multivitamin with minerals  1 tablet Oral Daily  . pantoprazole  40 mg Oral Daily  . polyethylene glycol  17 g Oral Daily  . sodium chloride flush  10-40 mL Intracatheter Q12H  . thiamine  100 mg Per Tube Daily    Infusions: . sodium chloride 250 mL (08/28/18 0014)    PRN Medications: acetaminophen (TYLENOL) oral  liquid 160 mg/5 mL, acetaminophen, lip balm, ondansetron (ZOFRAN) IV, senna, sodium chloride flush   Assessment/Plan   1. 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  He is markedly volume overloaded with suspected mixed septic/cardiogenic shock.  Echo 4/3 with severe biventricular failure, LVEF 20-25% small effusion, moderate to severe MR and severe TR.  He is now off dobutamine. - Developed CP/SOB yesterday. Received IV lasix. Weight down 6 pounds  - CVP now 3-4 Co-ox 75% - Will start torsemide 20mg  MWF (was on this previously on daily but we stopped due to severe volume depletion) - Continue Bidil 1/2 tab tid.  Hold SBP < 95 - Continue digoxin 0.125 daily. Level 0.3 - Off spiro due to hyperkalemia.  - No b-blocker with recent shock. No ACE/ARB/ARN with recent AKI - He is not going to be a candidate for advanced therapies.  LVAD likely not an option regardless with severe RV dysfunction and has had very poor compliance with medical therapy.   2. Chest pain - doubt ischemic - trop negative. ECG with non-specific repol abnormalities - cath 2016 no CAD  3. ID: Suspect component of septic shock, PCT 6.76.  Citrobacter freundii in blood initially. ?Source from lower leg/foot skin breakdown or stool. Febrile again am 4/9.  Afebrile today with normal WBCs.  CXR with LLL atelectasis versus infiltrate. Repeat cultures NGTD.  - Antibiotics broadened again on 4/9, currently on vancomycin/cefepime.  - Narrowed back to ceftriaxone 4/12. Stopped 4/15 - Most recent cultures remain negative.   4. AKI: Concern for ATN in setting of shock.  Was down at home for an undetermined length of time.  Initially required CVVH, now off. Resolved.  - Management of diuretics as above   5. Hypoglyemia: Resolved.     6. Neuro: Confusion/delirium, likely in setting  of critical illness/infection. Head CT 4/4 with no acute changes. Passed swallow study.  Aphonia resolved.  - improved  7. Anemia - Received 1 unit PRBCs on 08/30/18. Hgb stable today 8.1  8. Vascular: ABIs normal.  Suspect skin breakdown on foot related to venous stasis and marked swelling initially.     9. FEN:  - severe protein calorie malnutrition. Received short term tube feeds with Cor-trak which was removed 4/14.  -  Nutrition has seen yesterday  and recommended liberalizing diet due to poor intake. \  10. Hyperkalemia  - spiro off. K 4.7  11. LE wounds and sacral decub - WOC has seen for d/c recommendations.  - Stressed need to use bedside commode and urinal and not diaper   12  Deconditioning: Severe.  Out of bed, PT/OT.  Will need SNF - see below.   13. Disposition - Prognosis quite poor. He is ready for SNF placement when bed available. This is only remaining issue.  - He is ready for SNF but no bed available. Will continue to discuss with SW about options. Updated yesterday and no bed offer   Length of Stay: 21  Glori Bickers, MD  8:13 AM  Advanced Heart Failure Team Pager [phone] (M-F; 7a - 4p)  Please contact [facility] for night-coverage after hours (4p -7a ) and weekends on [email or web address]

## 2018-09-07 LAB — URINALYSIS, ROUTINE W REFLEX MICROSCOPIC
Bilirubin Urine: NEGATIVE
Glucose, UA: NEGATIVE mg/dL
Hgb urine dipstick: NEGATIVE
Ketones, ur: NEGATIVE mg/dL
Leukocytes,Ua: NEGATIVE
Nitrite: NEGATIVE
Protein, ur: NEGATIVE mg/dL
Specific Gravity, Urine: 1.012 (ref 1.005–1.030)
pH: 8 (ref 5.0–8.0)

## 2018-09-07 LAB — CBC WITH DIFFERENTIAL/PLATELET
Abs Immature Granulocytes: 0.57 10*3/uL — ABNORMAL HIGH (ref 0.00–0.07)
Basophils Absolute: 0.1 10*3/uL (ref 0.0–0.1)
Basophils Relative: 1 %
Eosinophils Absolute: 0.3 10*3/uL (ref 0.0–0.5)
Eosinophils Relative: 3 %
HCT: 29.3 % — ABNORMAL LOW (ref 39.0–52.0)
Hemoglobin: 9.2 g/dL — ABNORMAL LOW (ref 13.0–17.0)
Immature Granulocytes: 5 %
Lymphocytes Relative: 16 %
Lymphs Abs: 1.7 10*3/uL (ref 0.7–4.0)
MCH: 28.9 pg (ref 26.0–34.0)
MCHC: 31.4 g/dL (ref 30.0–36.0)
MCV: 92.1 fL (ref 80.0–100.0)
Monocytes Absolute: 0.9 10*3/uL (ref 0.1–1.0)
Monocytes Relative: 8 %
Neutro Abs: 7.4 10*3/uL (ref 1.7–7.7)
Neutrophils Relative %: 67 %
Platelets: 335 10*3/uL (ref 150–400)
RBC: 3.18 MIL/uL — ABNORMAL LOW (ref 4.22–5.81)
RDW: 18 % — ABNORMAL HIGH (ref 11.5–15.5)
WBC: 10.9 10*3/uL — ABNORMAL HIGH (ref 4.0–10.5)
nRBC: 0.2 % (ref 0.0–0.2)

## 2018-09-07 LAB — BASIC METABOLIC PANEL
Anion gap: 8 (ref 5–15)
BUN: 27 mg/dL — ABNORMAL HIGH (ref 6–20)
CO2: 25 mmol/L (ref 22–32)
Calcium: 8.8 mg/dL — ABNORMAL LOW (ref 8.9–10.3)
Chloride: 102 mmol/L (ref 98–111)
Creatinine, Ser: 1 mg/dL (ref 0.61–1.24)
GFR calc Af Amer: >60 mL/min (ref >60)
GFR calc non Af Amer: >60 mL/min (ref >60)
Glucose, Bld: 129 mg/dL — ABNORMAL HIGH (ref 70–99)
Potassium: 4.7 mmol/L (ref 3.5–5.1)
Sodium: 135 mmol/L (ref 135–145)

## 2018-09-07 LAB — COOXEMETRY PANEL
Carboxyhemoglobin: 1.5 % (ref 0.5–1.5)
Methemoglobin: 1.2 % (ref 0.0–1.5)
O2 Saturation: 74 %
Total hemoglobin: 8.9 g/dL — ABNORMAL LOW (ref 12.0–16.0)

## 2018-09-07 NOTE — Progress Notes (Signed)
Occupational Therapy Treatment Patient Details Name: Kyle Conrad MRN: [id number] DOB: November 13, 1965 Today's Date: 09/07/2018    History of present illness Pt is a 53 y.o. male admitted 08/16/18 with AMS after being found down by neighbor; treated for cardiogenic shock, septic shock with citrobacter bactermeia. Also with AKI; on CRRT 4/3-4/8. Has remained encephalopathic throughout admission. Head CT 4/4 with no acute changes. PMH includes biventricular HF, HTN.   OT comments  Pt requiring more assist for bed mobility and transfer to 3 in 1 this visit. Pt with increased groaning with all movement and BM. Fatigued after sitting on 3 in 1 for prolonged period and returned to bed. Goals updated.  Follow Up Recommendations  SNF;Supervision/Assistance - 24 hour    Equipment Recommendations       Recommendations for Other Services      Precautions / Restrictions Precautions Precautions: Fall Restrictions Weight Bearing Restrictions: No       Mobility Bed Mobility Overal bed mobility: Needs Assistance Bed Mobility: Sidelying to Sit;Sit to Supine   Sidelying to sit: Mod assist   Sit to supine: Mod assist   General bed mobility comments: assist to raise trunk and for LEs back into bed  Transfers Overall transfer level: Needs assistance Equipment used: Rolling walker (2 wheeled) Transfers: Sit to/from [facility] Sit to Stand: Mod assist Stand pivot transfers: Min assist       General transfer comment: assist to rise and steady    Balance Overall balance assessment: Needs assistance   Sitting balance-[name]: Fair     Standing balance support: Bilateral upper extremity supported Standing balance-[name]: Poor Standing balance comment: reliant on RW for support                           ADL either performed or assessed with clinical judgement   ADL Overall ADL's : Needs assistance/impaired     Grooming: Wash/dry hands;Sitting;Set  up               Lower Body Dressing: Total assistance;Bed level Lower Body Dressing Details (indicate cue type and reason): L sock Toilet Transfer: Minimal assistance;Stand-pivot;BSC   Toileting- Clothing Manipulation and Hygiene: Total assistance;Sit to/from stand         General ADL Comments: pt sitting on BSC for extended period of time, groaning to have a BM     Vision       Perception     Praxis      Cognition Arousal/Alertness: Awake/alert Behavior During Therapy: Flat affect Overall Cognitive Status: No family/caregiver present to determine baseline cognitive functioning Area of Impairment: Problem solving;Following commands                       Following Commands: Follows one step commands with increased time     Problem Solving: Slow processing;Decreased initiation;Requires verbal cues General Comments: pt requiring max encouragement to work with OT        Exercises     Shoulder Instructions       General Comments      Pertinent Vitals/ Pain       Pain Assessment: Faces Faces Pain Scale: Hurts even more Pain Location: Left leg wound, abdomen when having BM Pain Descriptors / Indicators: Grimacing;Moaning;Guarding Pain Intervention(s): Monitored during session  Home Living  Prior Functioning/Environment              Frequency  Min 2X/week        Progress Toward Goals  OT Goals(current goals can now be found in the care plan section)  Progress towards OT goals: Not progressing toward goals - comment(pt self limiting)  Acute Rehab OT Goals Patient Stated Goal: have a BM OT Goal Formulation: With patient Time For Goal Achievement: 09/21/18 Potential to Achieve Goals: [place] Discharge plan remains appropriate;Frequency remains appropriate    Co-evaluation                 AM-PAC OT "6 Clicks" Daily Activity     Outcome Measure   Help from  another person eating meals?: None Help from another person taking care of personal grooming?: A Little Help from another person toileting, which includes using toliet, bedpan, or urinal?: A Lot Help from another person bathing (including washing, rinsing, drying)?: A Lot Help from another person to put on and taking off regular upper body clothing?: A Little Help from another person to put on and taking off regular lower body clothing?: Total 6 Click Score: 15    End of Session Equipment Utilized During Treatment: Gait belt;Rolling walker  OT Visit Diagnosis: Unsteadiness on feet (R26.81);Other abnormalities of gait and mobility (R26.89);Muscle weakness (generalized) (M62.81);Other symptoms and signs involving cognitive function;Adult, failure to thrive (R62.7)   Activity Tolerance Patient limited by pain   Patient Left in bed;with call bell/phone within reach   Nurse Communication Other (comment)(aware pt had small BM)        Time: 1350-1430 OT Time Calculation (min): 40 min  Charges: OT General Charges $OT Visit: 1 Visit OT Treatments $Self Care/Home Management : 38-52 mins  [name], OTR/L Acute Rehabilitation Services Pager: [phone] Office: [phone]   Malka So 09/07/2018, 3:32 PM

## 2018-09-07 NOTE — TOC Progression Note (Signed)
Transition of Care [facility]) - Progression Note    Patient Details  Name: Kyle Conrad MRN: [id number] Date of Birth: 27-Oct-1965  Transition of Care [facility]) CM/SW [place], [place] Phone Number: [phone] 09/07/2018, 3:32 PM  Clinical Narrative:    Patient has no current bed offers- CSW called [facility] - they are continuing to review and will follow up with CSW. CSW will continue to search and pursue placement.  Financial Counselor, [place] - reports she completed Medicaid screening with the patient and will be submitting his application soon. CSW sent e-mail inquiring about  timeline of submission and referral to disability. CSW awaiting response. CSW AD updated on status.   CSW visit the patient at bedside, he responded and expressed his thoughts appropriately- Patient remains willing to go to SNF. Patient expressed he was frustrated because he is in the hospital and about his medical condition. He states he does not remember what happen, what he was wearing or where he was when  found by neighbor.Patient states he will find out more details when he speaks with his neighbor. 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 CSW called Patient's niece and she confirmed they were contacting his apartment manager and attempting to obtain permission to get his mail. CSW updated family on placement status.  Patient states no other questions or concerns at this time.   Thurmond Butts, MSW, [facility] Clinical Social Worker [phone]            Expected Discharge Plan: Skilled Nursing Facility Barriers to Discharge: SNF Pending payor source - LOG, SNF Pending bed offer  Expected Discharge Plan and Services Expected Discharge Plan: [place]                                               Social Determinants of Health (SDOH) Interventions     Readmission Risk Interventions Readmission Risk Prevention Plan 08/28/2018  Transportation Screening Complete  PCP or Specialist Appt within 3-5 Days Complete  HRI or [place] Complete  Social Work Consult for [place] Planning/Counseling Complete  Palliative Care Screening Not Applicable  Medication Review [employer] photographer) Complete  Some recent data might be hidden

## 2018-09-07 NOTE — Consult Note (Signed)
[place] Nurse wound follow up Re-evaluate foot wounds on LLE.   Wound type: Vascular wounds to left foot and toes.  Measurement:Left anterior foot with patchy areas of partial thickness skin loss, red sn moist with mod amt yellow drainage, no odor. All 5 toes to left foot remain dark purple with peeling skin, small amt drainage.  Pulse is detectable with doppler according to the bedside nurse Wound bed:5% fibrin slough.  Otherwise red. Drainage (amount, consistency, odor) minimal serosanguinous   Periwound: Dark cool toes. Dressing procedure/placement/frequency:Continue existing orders.  Will not follow at this time.  Please re-consult if needed.  Kyle Moras MSN, RN, FNP-BC CWON Wound, Ostomy, Continence Nurse Pager [phone]

## 2018-09-07 NOTE — Progress Notes (Signed)
. Patient ID: Kyle Conrad, male   DOB: 03-18-1966, 53 y.o.   MRN: [id number]     Advanced Heart Failure Rounding Note  PCP-Cardiologist: No primary care provider on file.   Subjective:    Starting torsemide 20 mg MWF today. Creatinine stable. Co-ox 74%. CVP 4-5  Denies CP, SOB, orthopnea or PND   CSW checking on medicaid status today. Pending SNF bed when available.  Objective:   Weight Range: 63.4 kg Body mass index is 18.44 kg/m.   Vital Signs:   Temp:  [98.1 F (36.7 C)-99.7 F (37.6 C)] 98.1 F (36.7 C) (04/24 0753) Pulse Rate:  [80-95] 93 (04/24 0753) Resp:  [13-23] 18 (04/24 0753) BP: (91-115)/(59-79) 115/79 (04/24 0753) SpO2:  [96 %-100 %] 97 % (04/24 0753) Weight:  [63.4 kg] 63.4 kg (04/24 0257) Last BM Date: 09/05/18  Weight change: Filed Weights   09/05/18 0300 09/06/18 0500 09/07/18 0257  Weight: 66.8 kg 64.1 kg 63.4 kg    Intake/Output:   Intake/Output Summary (Last 24 hours) at 09/07/2018 0935 Last data filed at 09/07/2018 0755 Gross per 24 hour  Intake 1424 ml  Output 2675 ml  Net -1251 ml      Physical Exam   General:Weak, cachetic. No resp difficulty. HEENT: Normal + temporal wasting. Neck: Supple. JVP flat. Carotids 2+ bilat; no bruits. No thyromegaly or nodule noted. Cor: PMI laterally displaced. RRR, No M/G/R noted Lungs: CTAB, normal effort. Abdomen: Soft, non-tender, non-distended, no HSM. No bruits or masses. +BS  Extremities: No cyanosis, clubbing, or rash. R and LLE no edema. Several skin wounds with dressings Neuro: Alert & orientedx3, cranial nerves grossly intact. moves all 4 extremities w/o difficulty. Affect pleasant   Telemetry   NSR 90s. Personally reviewed   Labs    CBC Recent Labs    09/06/18 0100 09/07/18 0311  WBC 9.2 10.9*  NEUTROABS 7.0 7.4  HGB 8.1* 9.2*  HCT 25.5* 29.3*  MCV 90.1 92.1  PLT 362 403   Basic Metabolic Panel Recent Labs    09/06/18 0100 09/07/18 0311  NA 133* 135  K 4.7 4.7   CL 98 102  CO2 25 25  GLUCOSE 91 129*  BUN 30* 27*  CREATININE 1.02 1.00  CALCIUM 8.4* 8.8*   Liver Function Tests No results for input(s): AST, ALT, ALKPHOS, BILITOT, PROT, ALBUMIN in the last 72 hours. No results for input(s): LIPASE, AMYLASE in the last 72 hours. Cardiac Enzymes Recent Labs    09/05/18 1315 09/05/18 2030 09/06/18 0100  TROPONINI <0.03 <0.03 <0.03    BNP: BNP (last 3 results) Recent Labs    03/24/18 1435 06/05/18 1411 08/16/18 1611  BNP 2,471.9* 3,961.4* 2,433.3*    ProBNP (last 3 results) No results for input(s): PROBNP in the last 8760 hours.   D-Dimer No results for input(s): DDIMER in the last 72 hours. Hemoglobin A1C No results for input(s): HGBA1C in the last 72 hours. Fasting Lipid Panel No results for input(s): CHOL, HDL, LDLCALC, TRIG, CHOLHDL, LDLDIRECT in the last 72 hours. Thyroid Function Tests No results for input(s): TSH, T4TOTAL, T3FREE, THYROIDAB in the last 72 hours.  Invalid input(s): FREET3  Other results:   Imaging    No results found.   Medications:     Scheduled Medications: . Chlorhexidine Gluconate Cloth  6 each Topical Daily  . collagenase   Topical Daily  . digoxin  0.125 mg Oral Daily  . feeding supplement (ENSURE ENLIVE)  237 mL Oral TID BM  . feeding  supplement (PRO-STAT SUGAR FREE 64)  30 mL Oral BID  . Gerhardt's butt cream   Topical Daily  . heparin injection (subcutaneous)  5,000 Units Subcutaneous Q8H  . isosorbide-hydrALAZINE  0.5 tablet Oral TID  . mouth rinse  15 mL Mouth Rinse q12n4p  . multivitamin with minerals  1 tablet Oral Daily  . pantoprazole  40 mg Oral Daily  . polyethylene glycol  17 g Oral Daily  . sodium chloride flush  10-40 mL Intracatheter Q12H  . thiamine  100 mg Per Tube Daily  . torsemide  20 mg Oral Q M,W,F    Infusions: . sodium chloride 250 mL (08/28/18 0014)    PRN Medications: acetaminophen (TYLENOL) oral liquid 160 mg/5 mL, acetaminophen, lip balm,  ondansetron (ZOFRAN) IV, senna, sodium chloride flush   Assessment/Plan   1. 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  He is markedly volume overloaded with suspected mixed septic/cardiogenic shock.  Echo 4/3 with severe biventricular failure, LVEF 20-25% small effusion, moderate to severe MR and severe TR.  He is now off dobutamine. - Developed CP/SOB 4/22, resolved with IV lasix. Trop negative. - CVP 4-5. Co-ox 74%. Can stop checking co-ox.  - Cotninue torsemide 20mg  MWF (was on this previously on daily but we stopped due to severe volume depletion) - Continue Bidil 1/2 tab tid.  Hold SBP < 95 - Continue digoxin 0.125 daily. Level 0.3 - Off spiro due to hyperkalemia.  - No b-blocker with recent shock. No ACE/ARB/ARN with recent AKI - He is not going to be a candidate for advanced therapies.  LVAD likely not an option regardless with severe RV dysfunction and has had very poor compliance with medical therapy.   2. Chest pain - doubt ischemic - trop negative. ECG with non-specific repol abnormalities.  - cath 2016 no CAD. No further CP.  3. ID: Suspect component of septic shock, PCT 6.76.  Citrobacter freundii in blood initially. ?Source from lower leg/foot skin breakdown or stool. Febrile again am 4/9. Afebrile. WBC 10.9. CXR with LLL atelectasis versus infiltrate. Repeat cultures NGTD.  - Antibiotics broadened again on 4/9, currently on vancomycin/cefepime.  - Narrowed back to ceftriaxone 4/12. Stopped 4/15 - Most recent cultures remain negative. No change.   4. AKI: Concern for ATN in setting of shock.  Was down at home for an undetermined length of time.  Initially required CVVH, now off. Resolved.  - Management of diuretics as above   5. Hypoglyemia: Resolved.  6. Neuro: Confusion/delirium, likely in setting of critical  illness/infection. Head CT 4/4 with no acute changes. Passed swallow study.  Aphonia resolved.  - Improved.  7. Anemia - Received 1 unit PRBCs on 08/30/18. Hgb stable 9.2  8. Vascular: ABIs normal.  Suspect skin breakdown on foot related to venous stasis and marked swelling initially.   No change.   9. FEN:  - severe protein calorie malnutrition. Received short term tube feeds with Cor-trak which was removed 4/14.  -  Nutrition saw again yesterday and has left detailed suggestions which we will implement.  10. Hyperkalemia  - spiro off. K 4.7  11. LE wounds and sacral decub - WOC has seen for d/c recommendations. No change.  - Stressed need to use bedside commode and urinal and not diaper   12  Deconditioning: Severe.  Out of bed, PT/OT.  Will need SNF - see  below. No change.   13. Disposition - Prognosis quite poor. He is ready for SNF placement when bed available. This is only remaining issue.  - He is ready for SNF but no bed available. Will continue to discuss with SW about options. Still has no bed offers. Rechecking on Medicaid status.   Length of Stay: 22  Glori Bickers, MD  10:01 AM  Advanced Heart Failure Team Pager [phone] (M-F; 7a - 4p)  Please contact [facility] for night-coverage after hours (4p -7a ) and weekends on [email or web address]

## 2018-09-07 NOTE — Progress Notes (Signed)
OT Cancellation Note  Patient Details Name: Jermaine Neuharth MRN: [id number] DOB: 30-Jun-1965   Cancelled Treatment:    Reason Eval/Treat Not Completed: Patient declined, no reason specified. Will try back.  Malka So 09/07/2018, 10:30 AM  [name], OTR/L Acute Rehabilitation Services Pager: [phone] Office: [phone]

## 2018-09-08 DIAGNOSIS — I5043 Acute on chronic combined systolic (congestive) and diastolic (congestive) heart failure: Secondary | ICD-10-CM

## 2018-09-08 LAB — BASIC METABOLIC PANEL
Anion gap: 8 (ref 5–15)
BUN: 26 mg/dL — ABNORMAL HIGH (ref 6–20)
CO2: 25 mmol/L (ref 22–32)
Calcium: 8.9 mg/dL (ref 8.9–10.3)
Chloride: 99 mmol/L (ref 98–111)
Creatinine, Ser: 0.9 mg/dL (ref 0.61–1.24)
GFR calc Af Amer: >60 mL/min (ref >60)
GFR calc non Af Amer: >60 mL/min (ref >60)
Glucose, Bld: 82 mg/dL (ref 70–99)
Potassium: 4.6 mmol/L (ref 3.5–5.1)
Sodium: 132 mmol/L — ABNORMAL LOW (ref 135–145)

## 2018-09-08 LAB — CBC WITH DIFFERENTIAL/PLATELET
Abs Immature Granulocytes: 0.45 10*3/uL — ABNORMAL HIGH (ref 0.00–0.07)
Basophils Absolute: 0.1 10*3/uL (ref 0.0–0.1)
Basophils Relative: 1 %
Eosinophils Absolute: 0.4 10*3/uL (ref 0.0–0.5)
Eosinophils Relative: 3 %
HCT: 27.1 % — ABNORMAL LOW (ref 39.0–52.0)
Hemoglobin: 8.3 g/dL — ABNORMAL LOW (ref 13.0–17.0)
Immature Granulocytes: 3 %
Lymphocytes Relative: 12 %
Lymphs Abs: 1.8 10*3/uL (ref 0.7–4.0)
MCH: 28.5 pg (ref 26.0–34.0)
MCHC: 30.6 g/dL (ref 30.0–36.0)
MCV: 93.1 fL (ref 80.0–100.0)
Monocytes Absolute: 1.3 10*3/uL — ABNORMAL HIGH (ref 0.1–1.0)
Monocytes Relative: 9 %
Neutro Abs: 10.6 10*3/uL — ABNORMAL HIGH (ref 1.7–7.7)
Neutrophils Relative %: 72 %
Platelets: 414 10*3/uL — ABNORMAL HIGH (ref 150–400)
RBC: 2.91 MIL/uL — ABNORMAL LOW (ref 4.22–5.81)
RDW: 17.9 % — ABNORMAL HIGH (ref 11.5–15.5)
WBC: 14.5 10*3/uL — ABNORMAL HIGH (ref 4.0–10.5)
nRBC: 0.1 % (ref 0.0–0.2)

## 2018-09-08 MED ORDER — POLYETHYLENE GLYCOL 3350 17 G PO PACK: 17.0000 g | PACK | Freq: Every day | ORAL | Status: DC

## 2018-09-08 MED ORDER — ORAL CARE MOUTH RINSE
15.0000 mL | Freq: Two times a day (BID) | OROMUCOSAL | Status: DC
  Administered 2018-09-08: 21:00:00 15 mL via OROMUCOSAL

## 2018-09-08 MED ORDER — POLYETHYLENE GLYCOL 3350 17 G PO PACK: 17.0000 g | PACK | Freq: Every day | ORAL | Status: DC | PRN

## 2018-09-08 NOTE — Progress Notes (Signed)
Patient ID: Kyle Conrad, male   DOB: 12-18-1965, 53 y.o.   MRN: [id number]     Advanced Heart Failure Rounding Note  PCP-Cardiologist: No primary care provider on file.   Subjective:    Stable creatinine, CVP 5 today.  Weight stable.  Eating better.  Still weak and having a hard time walking.  No dyspnea or chest pain.   Pending SNF bed when available.  Objective:   Weight Range: 63.3 kg Body mass index is 18.41 kg/m.   Vital Signs:   Temp:  [98.2 F (36.8 C)-99.1 F (37.3 C)] 99 F (37.2 C) (04/25 0735) Pulse Rate:  [94-101] 97 (04/25 0735) Resp:  [16-25] 16 (04/25 0735) BP: (96-115)/(66-85) 115/85 (04/25 0735) SpO2:  [95 %-99 %] 98 % (04/25 0735) Weight:  [63.3 kg] 63.3 kg (04/25 0243) Last BM Date: 09/05/18  Weight change: Filed Weights   09/06/18 0500 09/07/18 0257 09/08/18 0243  Weight: 64.1 kg 63.4 kg 63.3 kg    Intake/Output:   Intake/Output Summary (Last 24 hours) at 09/08/2018 0921 Last data filed at 09/08/2018 0738 Gross per 24 hour  Intake 720 ml  Output 635 ml  Net 85 ml      Physical Exam   General: Cachectic Neck: No JVD, no thyromegaly or thyroid nodule.  Lungs: Clear to auscultation bilaterally with normal respiratory effort. CV: Lateral PMI.  Heart regular S1/S2, no S3/S4, 2/6 HSM LLSB/apex.  No peripheral edema.   Abdomen: Soft, nontender, no hepatosplenomegaly, no distention.  Skin: Intact without lesions or rashes.  Neurologic: Alert and oriented x 3.  Psych: Normal affect. Extremities: No clubbing or cyanosis.  HEENT: Normal.    Telemetry   NSR 90s. Personally reviewed   Labs    CBC Recent Labs    09/07/18 0311 09/08/18 0612  WBC 10.9* 14.5*  NEUTROABS 7.4 10.6*  HGB 9.2* 8.3*  HCT 29.3* 27.1*  MCV 92.1 93.1  PLT 335 938*   Basic Metabolic Panel Recent Labs    09/07/18 0311 09/08/18 0612  NA 135 132*  K 4.7 4.6  CL 102 99  CO2 25 25  GLUCOSE 129* 82  BUN 27* 26*  CREATININE 1.00 0.90  CALCIUM 8.8* 8.9    Liver Function Tests No results for input(s): AST, ALT, ALKPHOS, BILITOT, PROT, ALBUMIN in the last 72 hours. No results for input(s): LIPASE, AMYLASE in the last 72 hours. Cardiac Enzymes Recent Labs    09/05/18 1315 09/05/18 2030 09/06/18 0100  TROPONINI <0.03 <0.03 <0.03    BNP: BNP (last 3 results) Recent Labs    03/24/18 1435 06/05/18 1411 08/16/18 1611  BNP 2,471.9* 3,961.4* 2,433.3*    ProBNP (last 3 results) No results for input(s): PROBNP in the last 8760 hours.   D-Dimer No results for input(s): DDIMER in the last 72 hours. Hemoglobin A1C No results for input(s): HGBA1C in the last 72 hours. Fasting Lipid Panel No results for input(s): CHOL, HDL, LDLCALC, TRIG, CHOLHDL, LDLDIRECT in the last 72 hours. Thyroid Function Tests No results for input(s): TSH, T4TOTAL, T3FREE, THYROIDAB in the last 72 hours.  Invalid input(s): FREET3  Other results:   Imaging    No results found.   Medications:     Scheduled Medications: . Chlorhexidine Gluconate Cloth  6 each Topical Daily  . collagenase   Topical Daily  . digoxin  0.125 mg Oral Daily  . feeding supplement (ENSURE ENLIVE)  237 mL Oral TID BM  . feeding supplement (PRO-STAT SUGAR FREE 64)  30 mL  Oral BID  . Gerhardt's butt cream   Topical Daily  . heparin injection (subcutaneous)  5,000 Units Subcutaneous Q8H  . isosorbide-hydrALAZINE  0.5 tablet Oral TID  . mouth rinse  15 mL Mouth Rinse q12n4p  . multivitamin with minerals  1 tablet Oral Daily  . pantoprazole  40 mg Oral Daily  . polyethylene glycol  17 g Oral Daily  . sodium chloride flush  10-40 mL Intracatheter Q12H  . thiamine  100 mg Per Tube Daily    Infusions: . sodium chloride 250 mL (08/28/18 0014)    PRN Medications: acetaminophen (TYLENOL) oral liquid 160 mg/5 mL, acetaminophen, lip balm, ondansetron (ZOFRAN) IV, senna, sodium chloride flush   Assessment/Plan   1. 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  He was markedly volume overloaded at admission with suspected mixed septic/cardiogenic shock.  Echo 4/3 with severe biventricular failure, LVEF 20-25% small effusion, moderate to severe MR and severe TR.  He is now off dobutamine and well-diuresed. CVP 5 today.   - Continue torsemide 20mg  MWF (was on this previously on daily but we stopped due to severe volume depletion) - Continue Bidil 1/2 tab tid.  Hold SBP < 95 - Continue digoxin 0.125 daily.  - Off spiro due to hyperkalemia.  - No b-blocker with recent shock. No ACE/ARB/ARN with recent AKI, may be able to add low dose losartan soon.  - He is not going to be a candidate for advanced therapies.  LVAD likely not an option regardless with severe RV dysfunction and has had very poor compliance with medical therapy.  2. Chest pain: Doubt ischemic. Trop negative. ECG with non-specific repol abnormalities. Cath 2016 no CAD. No further CP. 3. ID: Suspect component of septic shock, PCT 6.76.  Citrobacter freundii in blood initially. ?Source from lower leg/foot skin breakdown or stool. Febrile again am 4/9. Afebrile. WBC 14. CXR with LLL atelectasis versus infiltrate. Repeat cultures NGTD. Now off antibiotics.  4. AKI: Concern for ATN in setting of shock.  Was down at home for an undetermined length of time.  Initially required CVVH, now off. Resolved with creatinine now back to baseline.  5. Neuro: Confusion/delirium initially, likely in setting of critical illness/infection. Head CT 4/4 with no acute changes. Passed swallow study.  Aphonia resolved.  6. Anemia: Received 1 unit PRBCs on 08/30/18. Hgb 8.3 today, no overt bleeding.  7. Vascular: ABIs normal.  Suspect skin breakdown on foot related to venous stasis and marked swelling initially.   No change.  8. FEN: Severe protein calorie malnutrition. Received short  term tube feeds with Cor-trak which was removed 4/14.  9. Hyperkalemia: Off spironolactone. K 4.7 10. LE wounds and sacral decub: WOC has seen for d/c recommendations. No change.  - Stressed need to use bedside commode and urinal and not diaper  11.  Deconditioning: Severe.  Out of bed, PT/OT.  Will need SNF - see below. No change.  12. Disposition: Prognosis quite poor. He is ready for SNF placement when bed available. This is only remaining issue.  - Social worker addressing   Length of Stay: 23  Loralie Champagne, MD  9:21 AM  Advanced Heart Failure Team Pager [phone] (M-F; 7a - 4p)  Please contact [facility] for night-coverage after hours (4p -7a ) and weekends on [email or web address]

## 2018-09-08 NOTE — Progress Notes (Signed)
Physical Therapy Treatment Patient Details Name: Kyle Conrad MRN: [id number] DOB: 1965/10/25 Today's Date: 09/08/2018    History of Present Illness Pt is a 53 y.o. male admitted 08/16/18 with AMS after being found down by neighbor; treated for cardiogenic shock, septic shock with citrobacter bactermeia. Also with AKI; on CRRT 4/3-4/8. Has remained encephalopathic throughout admission. Head CT 4/4 with no acute changes. PMH includes biventricular HF, HTN.    PT Comments    Had to be lectured on need to get up and increase activity.  Emphasis on transitions, stamina on his feet and progressing gait.    Follow Up Recommendations  SNF;Supervision/Assistance - 24 hour     Equipment Recommendations  Other (comment)(TBA)    Recommendations for Other Services       Precautions / Restrictions Precautions Precautions: Fall    Mobility  Bed Mobility Overal bed mobility: Needs Assistance Bed Mobility: Supine to Sit;Sit to Supine     Supine to sit: Min guard Sit to supine: Min guard   General bed mobility comments: slow and effortful, but without assist and relatively low HOB  Transfers Overall transfer level: Needs assistance Equipment used: Rolling walker (2 wheeled) Transfers: Sit to/from Stand Sit to Stand: Min assist         General transfer comment: cues for hand placement and guarding  Ambulation/Gait Ambulation/Gait assistance: Min assist;Min guard Gait Distance (Feet): 250 Feet Assistive device: Rolling walker (2 wheeled) Gait Pattern/deviations: Step-through pattern Gait velocity: moderate Gait velocity interpretation: 1.31 - 2.62 ft/sec, indicative of limited community ambulator General Gait Details: cues for posture, better use of the RW.  Generally more stability and ability to increase speed noticeably for short distance.   Stairs             Wheelchair Mobility    Modified Rankin (Stroke Patients Only)       Balance Overall balance  assessment: Needs assistance   Sitting balance-[name]: Fair(to good)       Standing balance-[name]: Poor                              Cognition Arousal/Alertness: Awake/alert Behavior During Therapy: Flat affect Overall Cognitive Status: (more aware, but exerting more control by refusion and procra)                             Awareness: Emergent Problem Solving: Slow processing;Decreased initiation;Difficulty sequencing General Comments: max encouragement      Exercises      General Comments General comments (skin integrity, edema, etc.): vss thoughout      Pertinent Vitals/Pain Pain Assessment: Faces Faces Pain Scale: Hurts even more Pain Location: stomach Pain Descriptors / Indicators: Grimacing;Guarding;Discomfort Pain Intervention(s): Monitored during session    Home Living                      Prior Function            PT Goals (current goals can now be found in the care plan section) Acute Rehab PT Goals PT Goal Formulation: With patient Time For Goal Achievement: 09/19/18 Potential to Achieve Goals: Fair Progress towards PT goals: Progressing toward goals;Goals met and updated - see care plan    Frequency    Min 2X/week      PT Plan Current plan remains appropriate    Co-evaluation  AM-PAC PT "6 Clicks" Mobility   Outcome Measure  Help needed turning from your back to your side while in a flat bed without using bedrails?: A Little Help needed moving from lying on your back to sitting on the side of a flat bed without using bedrails?: A Little Help needed moving to and from a bed to a chair (including a wheelchair)?: A Little Help needed standing up from a chair using your arms (e.g., wheelchair or bedside chair)?: A Little Help needed to walk in hospital room?: A Little Help needed climbing 3-5 steps with a railing? : A Lot 6 Click Score: 17    End of Session   Activity  Tolerance: Patient tolerated treatment well Patient left: in bed;with call bell/phone within reach;with bed alarm set Nurse Communication: Mobility status PT Visit Diagnosis: Other abnormalities of gait and mobility (R26.89);Pain Pain - part of body: (abdomen, L LEG)     Time: 0141-0301 PT Time Calculation (min) (ACUTE ONLY): 17 min  Charges:  $Gait Training: 8-22 mins                     09/08/2018  Donnella Sham, PT Acute Rehabilitation Services [phone]  (pager) [phone]  (office)   Kyle Conrad 09/08/2018, 3:07 PM

## 2018-09-09 DIAGNOSIS — I5022 Chronic systolic (congestive) heart failure: Secondary | ICD-10-CM

## 2018-09-09 LAB — BASIC METABOLIC PANEL
Anion gap: 8 (ref 5–15)
BUN: 25 mg/dL — ABNORMAL HIGH (ref 6–20)
CO2: 26 mmol/L (ref 22–32)
Calcium: 9.1 mg/dL (ref 8.9–10.3)
Chloride: 100 mmol/L (ref 98–111)
Creatinine, Ser: 0.94 mg/dL (ref 0.61–1.24)
GFR calc Af Amer: >60 mL/min (ref >60)
GFR calc non Af Amer: >60 mL/min (ref >60)
Glucose, Bld: 76 mg/dL (ref 70–99)
Potassium: 4.7 mmol/L (ref 3.5–5.1)
Sodium: 134 mmol/L — ABNORMAL LOW (ref 135–145)

## 2018-09-09 LAB — CBC WITH DIFFERENTIAL/PLATELET
Abs Immature Granulocytes: 0.4 10*3/uL — ABNORMAL HIGH (ref 0.00–0.07)
Basophils Absolute: 0.1 10*3/uL (ref 0.0–0.1)
Basophils Relative: 1 %
Eosinophils Absolute: 0.4 10*3/uL (ref 0.0–0.5)
Eosinophils Relative: 3 %
HCT: 28.7 % — ABNORMAL LOW (ref 39.0–52.0)
Hemoglobin: 9 g/dL — ABNORMAL LOW (ref 13.0–17.0)
Immature Granulocytes: 3 %
Lymphocytes Relative: 15 %
Lymphs Abs: 2 10*3/uL (ref 0.7–4.0)
MCH: 28.9 pg (ref 26.0–34.0)
MCHC: 31.4 g/dL (ref 30.0–36.0)
MCV: 92.3 fL (ref 80.0–100.0)
Monocytes Absolute: 1.2 10*3/uL — ABNORMAL HIGH (ref 0.1–1.0)
Monocytes Relative: 9 %
Neutro Abs: 8.9 10*3/uL — ABNORMAL HIGH (ref 1.7–7.7)
Neutrophils Relative %: 69 %
Platelets: 476 10*3/uL — ABNORMAL HIGH (ref 150–400)
RBC: 3.11 MIL/uL — ABNORMAL LOW (ref 4.22–5.81)
RDW: 17.8 % — ABNORMAL HIGH (ref 11.5–15.5)
WBC: 12.9 10*3/uL — ABNORMAL HIGH (ref 4.0–10.5)
nRBC: 0.2 % (ref 0.0–0.2)

## 2018-09-09 MED ORDER — LOSARTAN POTASSIUM 25 MG PO TABS
12.5000 mg | ORAL_TABLET | Freq: Every day | ORAL | Status: DC
  Administered 2018-09-09 – 2018-09-12 (×4): 12.5 mg via ORAL
  Filled 2018-09-09 (×4): qty 1

## 2018-09-09 MED ORDER — COLLAGENASE 250 UNIT/GM EX OINT
TOPICAL_OINTMENT | Freq: Every day | CUTANEOUS | Status: DC
  Administered 2018-09-09 – 2018-09-18 (×8): via TOPICAL
  Filled 2018-09-09 (×2): qty 30

## 2018-09-09 MED ORDER — TORSEMIDE 20 MG PO TABS
20.0000 mg | ORAL_TABLET | ORAL | Status: DC
  Administered 2018-09-09 – 2018-09-13 (×3): 20 mg via ORAL
  Filled 2018-09-09 (×4): qty 1

## 2018-09-09 MED ORDER — SODIUM CHLORIDE 0.9% FLUSH
10.0000 mL | INTRAVENOUS | Status: DC | PRN
  Administered 2018-09-13: 10 mL
  Filled 2018-09-09: qty 40

## 2018-09-09 NOTE — Progress Notes (Signed)
Patient ID: Kyle Conrad, male   DOB: 1965/06/20, 53 y.o.   MRN: [id number]     Advanced Heart Failure Rounding Note  PCP-Cardiologist: No primary care provider on file.   Subjective:    Stable creatinine, CVP 6 today.  Weight lower.  Eating better.  Still weak and having a hard time walking.  No dyspnea or chest pain.   Pending SNF bed when available.  Objective:   Weight Range: 62.1 kg Body mass index is 18.06 kg/m.   Vital Signs:   Temp:  [98 F (36.7 C)-99.3 F (37.4 C)] 98.1 F (36.7 C) (04/26 0720) Pulse Rate:  [79-109] 109 (04/26 0720) Resp:  [13-19] 15 (04/26 0720) BP: (94-124)/(61-94) 124/94 (04/26 0720) SpO2:  [97 %-99 %] 97 % (04/26 0720) Weight:  [62.1 kg] 62.1 kg (04/26 0300) Last BM Date: 09/08/18  Weight change: Filed Weights   09/07/18 0257 09/08/18 0243 09/09/18 0300  Weight: 63.4 kg 63.3 kg 62.1 kg    Intake/Output:   Intake/Output Summary (Last 24 hours) at 09/09/2018 0912 Last data filed at 09/09/2018 0800 Gross per 24 hour  Intake 1040 ml  Output 1185 ml  Net -145 ml      Physical Exam   General: cachectic Neck: No JVD, no thyromegaly or thyroid nodule.  Lungs: Clear to auscultation bilaterally with normal respiratory effort. CV: Lateral PMI.  Heart regular S1/S2, no S3/S4, 2/6 HSM LLSB/apex.  No peripheral edema.   Abdomen: Soft, nontender, no hepatosplenomegaly, no distention.  Skin: Intact without lesions or rashes.  Neurologic: Alert and oriented x 3.  Psych: Normal affect. Extremities: No clubbing or cyanosis.  HEENT: Normal.    Telemetry   NSR 90s. Personally reviewed   Labs    CBC Recent Labs    09/08/18 0612 09/09/18 0451  WBC 14.5* 12.9*  NEUTROABS 10.6* 8.9*  HGB 8.3* 9.0*  HCT 27.1* 28.7*  MCV 93.1 92.3  PLT 414* 409*   Basic Metabolic Panel Recent Labs    09/08/18 0612 09/09/18 0451  NA 132* 134*  K 4.6 4.7  CL 99 100  CO2 25 26  GLUCOSE 82 76  BUN 26* 25*  CREATININE 0.90 0.94  CALCIUM 8.9 9.1    Liver Function Tests No results for input(s): AST, ALT, ALKPHOS, BILITOT, PROT, ALBUMIN in the last 72 hours. No results for input(s): LIPASE, AMYLASE in the last 72 hours. Cardiac Enzymes No results for input(s): CKTOTAL, CKMB, CKMBINDEX, TROPONINI in the last 72 hours.  BNP: BNP (last 3 results) Recent Labs    03/24/18 1435 06/05/18 1411 08/16/18 1611  BNP 2,471.9* 3,961.4* 2,433.3*    ProBNP (last 3 results) No results for input(s): PROBNP in the last 8760 hours.   D-Dimer No results for input(s): DDIMER in the last 72 hours. Hemoglobin A1C No results for input(s): HGBA1C in the last 72 hours. Fasting Lipid Panel No results for input(s): CHOL, HDL, LDLCALC, TRIG, CHOLHDL, LDLDIRECT in the last 72 hours. Thyroid Function Tests No results for input(s): TSH, T4TOTAL, T3FREE, THYROIDAB in the last 72 hours.  Invalid input(s): FREET3  Other results:   Imaging    No results found.   Medications:     Scheduled Medications: . Chlorhexidine Gluconate Cloth  6 each Topical Daily  . collagenase   Topical Daily  . digoxin  0.125 mg Oral Daily  . feeding supplement (ENSURE ENLIVE)  237 mL Oral TID BM  . feeding supplement (PRO-STAT SUGAR FREE 64)  30 mL Oral BID  . Gerhardt's  butt cream   Topical Daily  . heparin injection (subcutaneous)  5,000 Units Subcutaneous Q8H  . isosorbide-hydrALAZINE  0.5 tablet Oral TID  . mouth rinse  15 mL Mouth Rinse q12n4p  . multivitamin with minerals  1 tablet Oral Daily  . pantoprazole  40 mg Oral Daily  . sodium chloride flush  10-40 mL Intracatheter Q12H  . thiamine  100 mg Per Tube Daily    Infusions: . sodium chloride 250 mL (08/28/18 0014)    PRN Medications: acetaminophen (TYLENOL) oral liquid 160 mg/5 mL, acetaminophen, lip balm, ondansetron (ZOFRAN) IV, polyethylene glycol, senna, sodium chloride flush   Assessment/Plan   1. Acute on chronic systolic CHF with likely end-stage biventricular failure: Last echo in  10/19 with severely dilated LV, EF 20-25%, severe MR, severely dilated/severely dysfunctional RV, severe TR.  He was markedly volume overloaded at admission with suspected mixed septic/cardiogenic shock.  Echo 4/3 with severe biventricular failure, LVEF 20-25% small effusion, moderate to severe MR and severe TR.  He is now off dobutamine and well-diuresed. CVP 6 today.   - Continue torsemide 20mg  MWF (was on this previously on daily but we stopped due to severe volume depletion) - Continue Bidil 1/2 tab tid.  Hold SBP < 95 - Continue digoxin 0.125 daily.  - Off spiro due to hyperkalemia.  - I will try him on low dose losartan today, 12.5 mg daily.  - He is not going to be a candidate for advanced therapies.  LVAD likely not an option regardless with severe RV dysfunction and has had very poor compliance with medical therapy.  2. Chest pain: Doubt ischemic. Trop negative. ECG with non-specific repol abnormalities. Cath 2016 no CAD. No further CP. 3. ID: Suspect component of septic shock, PCT 6.76.  Citrobacter freundii in blood initially. ?Source from lower leg/foot skin breakdown or stool. Febrile again am 4/9. Afebrile. WBC 14. CXR with LLL atelectasis versus infiltrate. Repeat cultures NGTD. Now off antibiotics.  4. AKI: Concern for ATN in setting of shock.  Was down at home for an undetermined length of time.  Initially required CVVH, now off. Resolved with creatinine now back to baseline.  5. Neuro: Confusion/delirium initially, likely in setting of critical illness/infection. Head CT 4/4 with no acute changes. Passed swallow study.  Aphonia resolved.  6. Anemia: Received 1 unit PRBCs on 08/30/18. Hgb 9 today, no overt bleeding.  7. Vascular: ABIs normal.  Suspect skin breakdown on foot related to venous stasis and marked swelling initially.   No change.  8. FEN: Severe protein calorie malnutrition. Received short term tube feeds with Cor-trak which was removed 4/14.  9. Hyperkalemia: Off  spironolactone. K 4.7 10. LE wounds and sacral decub: WOC has seen for d/c recommendations. No change.  11.  Deconditioning: Severe.  Out of bed, PT/OT.  Will need SNF - see below. No change.  12. Disposition: Prognosis quite poor. He is ready for SNF placement when bed available. This is only remaining issue.  - Social worker addressing   Length of Stay: [place], MD  9:12 AM  Advanced Heart Failure Team Pager [phone] (M-F; 7a - 4p)  Please contact [facility] for night-coverage after hours (4p -7a ) and weekends on [email or web address]

## 2018-09-10 LAB — BASIC METABOLIC PANEL
Anion gap: 10 (ref 5–15)
BUN: 30 mg/dL — ABNORMAL HIGH (ref 6–20)
CO2: 26 mmol/L (ref 22–32)
Calcium: 9.1 mg/dL (ref 8.9–10.3)
Chloride: 98 mmol/L (ref 98–111)
Creatinine, Ser: 1.12 mg/dL (ref 0.61–1.24)
GFR calc Af Amer: >60 mL/min (ref >60)
GFR calc non Af Amer: >60 mL/min (ref >60)
Glucose, Bld: 97 mg/dL (ref 70–99)
Potassium: 4.6 mmol/L (ref 3.5–5.1)
Sodium: 134 mmol/L — ABNORMAL LOW (ref 135–145)

## 2018-09-10 LAB — CBC WITH DIFFERENTIAL/PLATELET
Abs Immature Granulocytes: 0.37 10*3/uL — ABNORMAL HIGH (ref 0.00–0.07)
Basophils Absolute: 0.1 10*3/uL (ref 0.0–0.1)
Basophils Relative: 1 %
Eosinophils Absolute: 0.5 10*3/uL (ref 0.0–0.5)
Eosinophils Relative: 3 %
HCT: 30.8 % — ABNORMAL LOW (ref 39.0–52.0)
Hemoglobin: 9.7 g/dL — ABNORMAL LOW (ref 13.0–17.0)
Immature Granulocytes: 2 %
Lymphocytes Relative: 16 %
Lymphs Abs: 2.4 10*3/uL (ref 0.7–4.0)
MCH: 29.1 pg (ref 26.0–34.0)
MCHC: 31.5 g/dL (ref 30.0–36.0)
MCV: 92.5 fL (ref 80.0–100.0)
Monocytes Absolute: 1.2 10*3/uL — ABNORMAL HIGH (ref 0.1–1.0)
Monocytes Relative: 8 %
Neutro Abs: 10.5 10*3/uL — ABNORMAL HIGH (ref 1.7–7.7)
Neutrophils Relative %: 70 %
Platelets: 587 10*3/uL — ABNORMAL HIGH (ref 150–400)
RBC: 3.33 MIL/uL — ABNORMAL LOW (ref 4.22–5.81)
RDW: 17.9 % — ABNORMAL HIGH (ref 11.5–15.5)
WBC: 15.2 10*3/uL — ABNORMAL HIGH (ref 4.0–10.5)
nRBC: 0.1 % (ref 0.0–0.2)

## 2018-09-10 NOTE — Progress Notes (Signed)
Physical Therapy Treatment Patient Details Name: Kyle Conrad MRN: [id number] DOB: January 05, 1966 Today's Date: 09/10/2018    History of Present Illness Pt is a 53 y.o. male admitted 08/16/18 with AMS after being found down by neighbor; treated for cardiogenic shock, septic shock with citrobacter bactermeia. Also with AKI; on CRRT 4/3-4/8. Has remained encephalopathic throughout admission. Head CT 4/4 with no acute changes. PMH includes biventricular HF, HTN.    PT Comments    Still needing encouragement and control.  Emphasis on moving away from the RW during therapy, working to improve gait pattern and speed.    Follow Up Recommendations  SNF;Supervision/Assistance - 24 hour     Equipment Recommendations  Other (comment)(TBA)    Recommendations for Other Services       Precautions / Restrictions Precautions Precautions: Fall    Mobility  Bed Mobility               General bed mobility comments: OOB on BSC  Transfers Overall transfer level: Needs assistance Equipment used: None Transfers: Sit to/from Stand Sit to Stand: Min guard Stand pivot transfers: Min guard       General transfer comment: cues for hand placement mostly for smooth descent  Ambulation/Gait Ambulation/Gait assistance: Min guard Gait Distance (Feet): 390 Feet Assistive device: None(RW available, but no used) Gait Pattern/deviations: Step-through pattern Gait velocity: slower to moderate. Gait velocity interpretation: 1.31 - 2.62 ft/sec, indicative of limited community ambulator General Gait Details: wide BOS,  flat-footed gait pattern with mild waddling.  Tentative and stiff, but not necessarily significantly unsteady.   Stairs             Wheelchair Mobility    Modified Rankin (Stroke Patients Only)       Balance Overall balance assessment: Needs assistance   Sitting balance-[name]: Fair       Standing balance-[name]: Fair Standing balance comment: unable to  accept significant challenge without support                            Cognition Arousal/Alertness: Awake/alert Behavior During Therapy: Flat affect;WFL for tasks assessed/performed Overall Cognitive Status: Within Functional Limits for tasks assessed(Getting back toward baseline mentation.)                                        Exercises      General Comments General comments (skin integrity, edema, etc.): vss      Pertinent Vitals/Pain Pain Assessment: Faces Faces Pain Scale: Hurts little more Pain Location: stomach and L LEG Pain Descriptors / Indicators: Grimacing;Guarding;Discomfort Pain Intervention(s): Monitored during session    Home Living                      Prior Function            PT Goals (current goals can now be found in the care plan section) Acute Rehab PT Goals Patient Stated Goal: Moving around easier. PT Goal Formulation: With patient Time For Goal Achievement: 09/19/18 Potential to Achieve Goals: Fair Progress towards PT goals: Progressing toward goals    Frequency    Min 2X/week      PT Plan Current plan remains appropriate    Co-evaluation              AM-PAC PT "6 Clicks" Mobility   Outcome Measure  Help needed turning from your back to your side while in a flat bed without using bedrails?: A Little Help needed moving from lying on your back to sitting on the side of a flat bed without using bedrails?: A Little Help needed moving to and from a bed to a chair (including a wheelchair)?: A Little Help needed standing up from a chair using your arms (e.g., wheelchair or bedside chair)?: A Little Help needed to walk in hospital room?: A Little Help needed climbing 3-5 steps with a railing? : A Lot 6 Click Score: 17    End of Session   Activity Tolerance: Patient tolerated treatment well Patient left: in chair;with call bell/phone within reach Nurse Communication: Mobility status PT Visit  Diagnosis: Other abnormalities of gait and mobility (R26.89);Pain Pain - Right/Left: Left Pain - part of body: Leg(abdomen)     Time: 3754-3606 PT Time Calculation (min) (ACUTE ONLY): 20 min  Charges:  $Gait Training: 8-22 mins                     09/10/2018  Donnella Sham, PT Acute Rehabilitation Services [phone]  (pager) [phone]  (office)   Tessie Fass Dudley Cooley 09/10/2018, 1:10 PM

## 2018-09-10 NOTE — TOC Progression Note (Signed)
Transition of Care [facility]) - Progression Note    Patient Details  Name: Kyle Conrad MRN: [id number] Date of Birth: May 10, 1966  [facility] CM/SW [place], [place] Phone Number: [phone] 09/10/2018, 12:11 PM  Clinical Narrative:    Patient has not bed offers- Admission Coordinator for [facility] in [place] advised [facility] referral has been given to the director of the facility for review and they will contact CSW 1-2 days. CSW advised  AC the patient is ready for d/c is awaiting bed offer and approval of LOG.     Expected Discharge Plan: Skilled Nursing Facility Barriers to Discharge: SNF Pending payor source - LOG, SNF Pending bed offer  Expected Discharge Plan and Services Expected Discharge Plan: [place]                                               Social Determinants of Health (SDOH) Interventions    Readmission Risk Interventions Readmission Risk Prevention Plan 08/28/2018  Transportation Screening Complete  PCP or Specialist Appt within 3-5 Days Complete  HRI or [place] Complete  Social Work Consult for [place] Planning/Counseling Complete  Palliative Care Screening Not Applicable  Medication Review [employer] photographer) Complete  Some recent data might be hidden

## 2018-09-10 NOTE — Progress Notes (Signed)
Patient ID: Kyle Conrad, male   DOB: 1966/02/13, 53 y.o.   MRN: [id number]     Advanced Heart Failure Rounding Note  PCP-Cardiologist: No primary care provider on file.   Subjective:    Stable creatinine.  Central line now out.  No dyspnea or chest pain.   Pending SNF bed when available.  Objective:   Weight Range: 62.9 kg Body mass index is 18.3 kg/m.   Vital Signs:   Temp:  [97.5 F (36.4 C)-99.3 F (37.4 C)] 97.5 F (36.4 C) (04/27 0300) Pulse Rate:  [92-108] 98 (04/27 0300) Resp:  [13-20] 14 (04/27 0300) BP: (77-114)/(45-74) 114/74 (04/27 0300) SpO2:  [98 %-100 %] 98 % (04/27 0300) Weight:  [62.9 kg] 62.9 kg (04/27 0500) Last BM Date: 09/09/18  Weight change: Filed Weights   09/08/18 0243 09/09/18 0300 09/10/18 0500  Weight: 63.3 kg 62.1 kg 62.9 kg    Intake/Output:   Intake/Output Summary (Last 24 hours) at 09/10/2018 0726 Last data filed at 09/10/2018 0600 Gross per 24 hour  Intake 1320 ml  Output 2050 ml  Net -730 ml      Physical Exam   General: Cachectic Neck: JVP 8 cm, no thyromegaly or thyroid nodule.  Lungs: Clear to auscultation bilaterally with normal respiratory effort. CV: Lateral PMI.  Heart regular S1/S2, no S3/S4, 2/6 HSM LLSB/apex.  No peripheral edema.   Abdomen: Soft, nontender, no hepatosplenomegaly, no distention.  Skin: Intact without lesions or rashes.  Neurologic: Alert and oriented x 3.  Psych: Normal affect. Extremities: No clubbing or cyanosis.  HEENT: Normal.    Telemetry   NSR 90s. Personally reviewed   Labs    CBC Recent Labs    09/09/18 0451 09/10/18 0444  WBC 12.9* 15.2*  NEUTROABS 8.9* 10.5*  HGB 9.0* 9.7*  HCT 28.7* 30.8*  MCV 92.3 92.5  PLT 476* 782*   Basic Metabolic Panel Recent Labs    09/09/18 0451 09/10/18 0444  NA 134* 134*  K 4.7 4.6  CL 100 98  CO2 26 26  GLUCOSE 76 97  BUN 25* 30*  CREATININE 0.94 1.12  CALCIUM 9.1 9.1   Liver Function Tests No results for input(s): AST, ALT,  ALKPHOS, BILITOT, PROT, ALBUMIN in the last 72 hours. No results for input(s): LIPASE, AMYLASE in the last 72 hours. Cardiac Enzymes No results for input(s): CKTOTAL, CKMB, CKMBINDEX, TROPONINI in the last 72 hours.  BNP: BNP (last 3 results) Recent Labs    03/24/18 1435 06/05/18 1411 08/16/18 1611  BNP 2,471.9* 3,961.4* 2,433.3*    ProBNP (last 3 results) No results for input(s): PROBNP in the last 8760 hours.   D-Dimer No results for input(s): DDIMER in the last 72 hours. Hemoglobin A1C No results for input(s): HGBA1C in the last 72 hours. Fasting Lipid Panel No results for input(s): CHOL, HDL, LDLCALC, TRIG, CHOLHDL, LDLDIRECT in the last 72 hours. Thyroid Function Tests No results for input(s): TSH, T4TOTAL, T3FREE, THYROIDAB in the last 72 hours.  Invalid input(s): FREET3  Other results:   Imaging    No results found.   Medications:     Scheduled Medications: . collagenase   Topical Daily  . digoxin  0.125 mg Oral Daily  . feeding supplement (ENSURE ENLIVE)  237 mL Oral TID BM  . feeding supplement (PRO-STAT SUGAR FREE 64)  30 mL Oral BID  . Gerhardt's butt cream   Topical Daily  . heparin injection (subcutaneous)  5,000 Units Subcutaneous Q8H  . isosorbide-hydrALAZINE  0.5 tablet  Oral TID  . losartan  12.5 mg Oral Daily  . multivitamin with minerals  1 tablet Oral Daily  . pantoprazole  40 mg Oral Daily  . sodium chloride flush  10-40 mL Intracatheter Q12H  . thiamine  100 mg Per Tube Daily  . torsemide  20 mg Oral QODAY    Infusions: . sodium chloride 250 mL (08/28/18 0014)    PRN Medications: acetaminophen (TYLENOL) oral liquid 160 mg/5 mL, acetaminophen, lip balm, ondansetron (ZOFRAN) IV, polyethylene glycol, senna, sodium chloride flush, sodium chloride flush   Assessment/Plan   1. 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  He was markedly volume overloaded at admission with suspected mixed septic/cardiogenic shock.  Echo 4/3 with severe biventricular failure, LVEF 20-25% small effusion, moderate to severe MR and severe TR.  He is now off dobutamine and well-diuresed.    - Continue torsemide 20mg  MWF (was on this previously on daily but we stopped due to severe volume depletion) - Continue Bidil 1/2 tab tid.  Hold SBP < 95 - Continue digoxin 0.125 daily.  - Off spiro due to hyperkalemia.  - Continue losartan 12.5 mg daily. Will not increase with soft BP at times.  - He is not going to be a candidate for advanced therapies.  LVAD likely not an option regardless with severe RV dysfunction and has had very poor compliance with medical therapy.  2. Chest pain: Doubt ischemic. Trop negative. ECG with non-specific repol abnormalities. Cath 2016 no CAD. No further CP. 3. ID: Suspect component of septic shock, PCT 6.76.  Citrobacter freundii in blood initially. ?Source from lower leg/foot skin breakdown or stool. Febrile again am 4/9. Afebrile. WBC 15. Repeat cultures NGTD. Now off antibiotics.  4. AKI: Concern for ATN in setting of shock.  Was down at home for an undetermined length of time.  Initially required CVVH, now off. Resolved with creatinine now back to baseline.  5. Neuro: Confusion/delirium initially, likely in setting of critical illness/infection. Head CT 4/4 with no acute changes. Passed swallow study.  Aphonia resolved.  6. Anemia: Received 1 unit PRBCs on 08/30/18. Hgb 9.7 today, no overt bleeding.  7. Vascular: ABIs normal.  Suspect skin breakdown on foot related to venous stasis and marked swelling initially.   No change.  8. FEN: Severe protein calorie malnutrition. Received short term tube feeds with Cor-trak which was removed 4/14.  9. Hyperkalemia: Off spironolactone. K 4.6. 10. LE wounds and sacral decub: WOC has seen for d/c recommendations. No change.  11.   Deconditioning: Severe.  Out of bed, PT/OT.  Will need SNF - see below. No change.  12. Disposition: Prognosis quite poor. He is ready for SNF placement when bed available. This is only remaining issue.  - Social worker addressing   Length of Stay: 25  Loralie Champagne, MD  7:26 AM  Advanced Heart Failure Team Pager [phone] (M-F; 7a - 4p)  Please contact [facility] for night-coverage after hours (4p -7a ) and weekends on [email or web address]

## 2018-09-11 ENCOUNTER — Telehealth (HOSPITAL_COMMUNITY): Payer: Self-pay

## 2018-09-11 LAB — CBC WITH DIFFERENTIAL/PLATELET
Abs Immature Granulocytes: 0.2 10*3/uL — ABNORMAL HIGH (ref 0.00–0.07)
Basophils Absolute: 0.1 10*3/uL (ref 0.0–0.1)
Basophils Relative: 0 %
Eosinophils Absolute: 0.3 10*3/uL (ref 0.0–0.5)
Eosinophils Relative: 2 %
HCT: 27.1 % — ABNORMAL LOW (ref 39.0–52.0)
Hemoglobin: 8.5 g/dL — ABNORMAL LOW (ref 13.0–17.0)
Immature Granulocytes: 2 %
Lymphocytes Relative: 12 %
Lymphs Abs: 1.6 10*3/uL (ref 0.7–4.0)
MCH: 29.1 pg (ref 26.0–34.0)
MCHC: 31.4 g/dL (ref 30.0–36.0)
MCV: 92.8 fL (ref 80.0–100.0)
Monocytes Absolute: 1.2 10*3/uL — ABNORMAL HIGH (ref 0.1–1.0)
Monocytes Relative: 9 %
Neutro Abs: 10 10*3/uL — ABNORMAL HIGH (ref 1.7–7.7)
Neutrophils Relative %: 75 %
Platelets: 566 10*3/uL — ABNORMAL HIGH (ref 150–400)
RBC: 2.92 MIL/uL — ABNORMAL LOW (ref 4.22–5.81)
RDW: 18.1 % — ABNORMAL HIGH (ref 11.5–15.5)
WBC: 13.4 10*3/uL — ABNORMAL HIGH (ref 4.0–10.5)
nRBC: 0 % (ref 0.0–0.2)

## 2018-09-11 LAB — BASIC METABOLIC PANEL
Anion gap: 8 (ref 5–15)
BUN: 37 mg/dL — ABNORMAL HIGH (ref 6–20)
CO2: 27 mmol/L (ref 22–32)
Calcium: 8.9 mg/dL (ref 8.9–10.3)
Chloride: 97 mmol/L — ABNORMAL LOW (ref 98–111)
Creatinine, Ser: 1.15 mg/dL (ref 0.61–1.24)
GFR calc Af Amer: >60 mL/min (ref >60)
GFR calc non Af Amer: >60 mL/min (ref >60)
Glucose, Bld: 82 mg/dL (ref 70–99)
Potassium: 4.4 mmol/L (ref 3.5–5.1)
Sodium: 132 mmol/L — ABNORMAL LOW (ref 135–145)

## 2018-09-11 LAB — DIGOXIN LEVEL: Digoxin Level: 0.5 ng/mL — ABNORMAL LOW (ref 0.8–2.0)

## 2018-09-11 LAB — GLUCOSE, CAPILLARY
Glucose-Capillary: 101 mg/dL — ABNORMAL HIGH (ref 70–99)
Glucose-Capillary: 122 mg/dL — ABNORMAL HIGH (ref 70–99)

## 2018-09-11 MED ORDER — SPIRONOLACTONE 12.5 MG HALF TABLET
12.5000 mg | ORAL_TABLET | Freq: Every day | ORAL | Status: DC
  Administered 2018-09-11 – 2018-09-20 (×10): 12.5 mg via ORAL
  Filled 2018-09-11 (×10): qty 1

## 2018-09-11 NOTE — TOC Progression Note (Addendum)
Transition of Care [facility]) - Progression Note    Patient Details  Name: Kyle Conrad MRN: [id number] Date of Birth: 29-Feb-1966  Transition of Care [facility]) CM/SW [place], [place] Phone Number: 09/11/2018, 12:40 PM  Clinical Narrative:    Patient has not bed offers   [facility] is continuing to review referral- CSW advised SNF  patient's Medicaid application and disability process has started.   Financial Counseling has confirm patient's Medicaid  application will be filed on Friday.   CSW visited with the patient at bedside -CSW gave update on placement and informed decision is pending.  Patient remains willing to go to SNF but expressed he don't want to go but he don't want " burden his family".  Patient states he was in pain but appeared to be in a good mood. He smiled and laughed as he talked about his family and friends. Patient called his cousin, [name] while CSW was in the room to provide placement update to the family. Family was very supportive and encouraging.   Patient and family states no questions or concerns at this time.   Expected Discharge Plan: Skilled Nursing Facility Barriers to Discharge: SNF Pending payor source - [facility], SNF Pending bed offer  Expected Discharge Plan and Services Expected Discharge Plan: [place]                                               Social Determinants of Health (SDOH) Interventions    Readmission Risk Interventions Readmission Risk Prevention Plan 08/28/2018  Transportation Screening Complete  PCP or Specialist Appt within 3-5 Days Complete  HRI or [place] Complete  Social Work Consult for [place] Planning/Counseling Complete  Palliative Care Screening Not Applicable  Medication Review [employer]) Complete  Some recent data might be hidden

## 2018-09-11 NOTE — Progress Notes (Signed)
Nutrition Follow-up  DOCUMENTATION CODES:   Non-severe (moderate) malnutrition in context of chronic illness, Underweight  INTERVENTION:  - Continue with Regular diet  - ContinueEnsure Enlive poTID, each supplement provides 350 kcal and 20 grams of protein  - Continue Pro-stat 30 ml po BID, each supplement provides 100 kcal and 15 grams of protein  -ContinueMagicCup TID with meals, each supplement provides 290 kcal and 9 grams of protein  - Continue MVI with minerals daily  -Gravy with meats at meals  - Continue to encourage adequate PO intake  NUTRITION DIAGNOSIS:   Moderate Malnutrition related to chronic illness (CHF) as evidenced by moderate fat depletion, moderate muscle depletion, severe muscle depletion.  Ongoing, being addressed via diet liberalization, oral nutrition supplements  GOAL:   Patient will meet greater than or equal to 90% of their needs  Progressing  MONITOR:   PO intake, Supplement acceptance, Labs, Skin, I & O's, Weight trends  REASON FOR ASSESSMENT:   Consult Assessment of nutrition requirement/status  ASSESSMENT:   53 year old male who presented to the ED on 4/2 with AMS and weakness. PMH of CHF, HTN. Pt admitted with decompensated biventricular heart failure and shock.  4/03 - CRRT initiated 4/04 - SLP evaluation with recommendations for regular diet and thin liquids 4/06 - Cortrak placed and TF initiated 4/08 - CRRT off 4/13 - transitioned to bolus TF 4/14 - Cortrak removed  Pt does not have SNF bed offers as of 4/28 per TOC note.  Weight fairly stable since 4/23 with fluctuations between 136-141 lbs. Current weight is 138.89 lbs. Will continue to monitor trends. Pt now considered underweight based on BMI.  Spoke with pt via phone call to room. Pt in good spirits and states "lunch was not bad." Pt reports that he had a tuna salad sandwich on wheat but that he prefers tuna salad made with mustard and mayo rather than just  made with mayo. Pt was able to get mustard packets to add to the tuna salad to make it more enjoyable. Pt states that he ate eggs, toast, grits, and a fruit cup with coffee for breakfast this AM.  Pt states that he thinks he is eating more and he thinks his appetite has improved and that "both of those go hand-in-hand."  Pt endorses drinking 2-3 Ensures daily and endorses taking the Pro-stat as ordered.  Pt states that he has learned he does with "smaller items" at meals and that he "digests these better." Pt states that he has ordered a blueberry muffin with butter and jelly, frosted flakes with milk, yogurt, orange juice, cranberry juice, and coffee tomorrow. Pt states he plans on "starting with" the muffin then "diving in" to the cereal so that he can "digest each thing one at a time." RD encouraged pt to continue choose menu items that he tolerates well and enjoys eating.  Meal Completion: 4/25: 80%, 30% 4/26: 100%, 80% 4/27: 100%, 80%,  4/28: 100%, 75%  Noted one meal completion record missing for the last 3 days. The above average to 81% meal completion.  Medications reviewed and include: Ensure Enlive TID, Pro-stat 30 ml BID, MVI with minerals daily, Protonix, spironolactone, thiamine, Torsemide 20 mg every other day  Labs reviewed: sodium 132 (L), hemoglobin 8.5 (L) CBG: 101  UOP: 2125 ml x 24 hours I/O's: -39.6 L since admit  Diet Order:   Diet Order            Diet regular Room service appropriate? Yes; Fluid consistency: Thin  Diet effective now              EDUCATION NEEDS:   Education needs have been addressed  Skin:  Skin Assessment: Skin Integrity Issues: DTI: left buttocks, left thigh Other: non-pressure wound to scrotum, weeping from BLE  Last BM:  09/10/18 large type 5  Height:   Ht Readings from Last 1 Encounters:  08/23/18 6\' 1"  (1.854 m)    Weight:   Wt Readings from Last 1 Encounters:  09/11/18 63 kg    Ideal Body Weight:  80.9 kg  BMI:   Body mass index is 18.32 kg/m.  Estimated Nutritional Needs:   Kcal:  2000-2200 (31-34 kcal/kg actual body weight)  Protein:  110-125 grams  Fluid:  per MD    Gaynell Face, MS, RD, LDN Inpatient Clinical Dietitian Pager: [phone] Weekend/After Hours: [phone]

## 2018-09-11 NOTE — Progress Notes (Signed)
Occupational Therapy Treatment Patient Details Name: Kyle Conrad MRN: [id number] DOB: 1965/12/20 Today's Date: 09/11/2018    History of present illness Pt is a 53 y.o. male admitted 08/16/18 with AMS after being found down by neighbor; treated for cardiogenic shock, septic shock with citrobacter bactermeia. Also with AKI; on CRRT 4/3-4/8. Has remained encephalopathic throughout admission. Head CT 4/4 with no acute changes. PMH includes biventricular HF, HTN.   OT comments  Pt is able to perform toilet transfers with min guard assist.  Max A for LB ADLs, but anticipate, he will progress nicely with with encouragement.  After toileting, he asked to ambulate in hallways and did so with min guard assist.   Follow Up Recommendations  SNF;Supervision/Assistance - 24 hour    Equipment Recommendations  None recommended by OT    Recommendations for Other Services      Precautions / Restrictions Precautions Precautions: Fall       Mobility Bed Mobility Overal bed mobility: Needs Assistance Bed Mobility: Sidelying to Sit   Sidelying to sit: Supervision          Transfers Overall transfer level: Needs assistance Equipment used: Rolling walker (2 wheeled) Transfers: Sit to/from [facility] Sit to Stand: Min guard Stand pivot transfers: Min guard       General transfer comment: min guard for safety     Balance Overall balance assessment: Needs assistance Sitting-balance support: No upper extremity supported;Feet supported Sitting balance-[name]: Good     Standing balance support: No upper extremity supported Standing balance-[name]: Fair Standing balance comment: able to maintain static standing with min guard assist and no UE support                            ADL either performed or assessed with clinical judgement   ADL Overall ADL's : Needs assistance/impaired     Grooming: Wash/dry hands;Wash/dry face;Set up;Sitting                    Toilet Transfer: Min guard;Stand-pivot;BSC;RW   Toileting- Clothing Manipulation and Hygiene: Maximal assistance;Sit to/from stand       Functional mobility during ADLs: Min guard;Minimal assistance;Rolling walker       Vision       Perception     Praxis      Cognition Arousal/Alertness: Awake/alert Behavior During Therapy: WFL for tasks assessed/performed Overall Cognitive Status: Within Functional Limits for tasks assessed                                 General Comments: WFL for basic ADL tasks         Exercises     Shoulder Instructions       General Comments VSS.  Pt ambulated ~300' after toileting with min guard assist     Pertinent Vitals/ Pain       Pain Assessment: Faces Faces Pain Scale: Hurts little more Pain Location: stomach and L LEG Pain Descriptors / Indicators: Grimacing;Guarding;Moaning Pain Intervention(s): Monitored during session  Home Living                                          Prior Functioning/Environment              Frequency  Min 2X/week  Progress Toward Goals  OT Goals(current goals can now be found in the care plan section)  Progress towards OT goals: Progressing toward goals     Plan Discharge plan remains appropriate;Frequency remains appropriate    Co-evaluation                 AM-PAC OT "6 Clicks" Daily Activity     Outcome Measure   Help from another person eating meals?: None Help from another person taking care of personal grooming?: A Little Help from another person toileting, which includes using toliet, bedpan, or urinal?: A Lot Help from another person bathing (including washing, rinsing, drying)?: A Lot Help from another person to put on and taking off regular upper body clothing?: A Little Help from another person to put on and taking off regular lower body clothing?: A Lot 6 Click Score: 16    End of Session Equipment  Utilized During Treatment: Rolling walker  OT Visit Diagnosis: Unsteadiness on feet (R26.81);Other abnormalities of gait and mobility (R26.89);Muscle weakness (generalized) (M62.81);Other symptoms and signs involving cognitive function;Adult, failure to thrive (R62.7)   Activity Tolerance Patient tolerated treatment well   Patient Left in chair;with call bell/phone within reach;with chair alarm set   Nurse Communication Mobility status        Time: 3557-3220 OT Time Calculation (min): 38 min  Charges: OT General Charges $OT Visit: 1 Visit OT Treatments $Self Care/Home Management : 23-37 mins $Therapeutic Activity: 8-22 mins  Lucille Passy, OTR/L Acute Rehabilitation Services Pager [phone] Office [phone]    Lucille Passy M 09/11/2018, 3:24 PM

## 2018-09-11 NOTE — Progress Notes (Signed)
Patient ID: Kyle Conrad, male   DOB: March 31, 1966, 53 y.o.   MRN: [id number]     Advanced Heart Failure Rounding Note  PCP-Cardiologist: No primary care provider on file.   Subjective:    Weight and creatinine stable.  BP ok.  No complaints.  Continues to work with PT.   Pending SNF bed when available.  Objective:   Weight Range: 63 kg Body mass index is 18.32 kg/m.   Vital Signs:   Temp:  [98 F (36.7 C)-99.4 F (37.4 C)] 98 F (36.7 C) (04/28 0729) Pulse Rate:  [95-116] 99 (04/28 0729) Resp:  [14-18] 18 (04/28 0330) BP: (80-144)/(54-79) 107/79 (04/28 0729) SpO2:  [98 %-100 %] 99 % (04/28 0729) Weight:  [63 kg] 63 kg (04/28 0503) Last BM Date: 09/10/18  Weight change: Filed Weights   09/09/18 0300 09/10/18 0500 09/11/18 0503  Weight: 62.1 kg 62.9 kg 63 kg    Intake/Output:   Intake/Output Summary (Last 24 hours) at 09/11/2018 1000 Last data filed at 09/11/2018 0733 Gross per 24 hour  Intake 1624 ml  Output 1975 ml  Net -351 ml      Physical Exam   General: cachectic Neck: No JVD, no thyromegaly or thyroid nodule.  Lungs: Clear to auscultation bilaterally with normal respiratory effort. CV: Lateral PMI.  Heart regular S1/S2, no S3/S4, 2/6 HSM LLSB/apex.  No peripheral edema.   Abdomen: Soft, nontender, no hepatosplenomegaly, no distention.  Skin: Intact without lesions or rashes.  Neurologic: Alert and oriented x 3.  Psych: Normal affect. Extremities: No clubbing or cyanosis.  HEENT: Normal.    Telemetry   NSR 90s. Personally reviewed   Labs    CBC Recent Labs    09/10/18 0444 09/11/18 0445  WBC 15.2* 13.4*  NEUTROABS 10.5* 10.0*  HGB 9.7* 8.5*  HCT 30.8* 27.1*  MCV 92.5 92.8  PLT 587* 270*   Basic Metabolic Panel Recent Labs    09/10/18 0444 09/11/18 0445  NA 134* 132*  K 4.6 4.4  CL 98 97*  CO2 26 27  GLUCOSE 97 82  BUN 30* 37*  CREATININE 1.12 1.15  CALCIUM 9.1 8.9   Liver Function Tests No results for input(s): AST, ALT,  ALKPHOS, BILITOT, PROT, ALBUMIN in the last 72 hours. No results for input(s): LIPASE, AMYLASE in the last 72 hours. Cardiac Enzymes No results for input(s): CKTOTAL, CKMB, CKMBINDEX, TROPONINI in the last 72 hours.  BNP: BNP (last 3 results) Recent Labs    03/24/18 1435 06/05/18 1411 08/16/18 1611  BNP 2,471.9* 3,961.4* 2,433.3*    ProBNP (last 3 results) No results for input(s): PROBNP in the last 8760 hours.   D-Dimer No results for input(s): DDIMER in the last 72 hours. Hemoglobin A1C No results for input(s): HGBA1C in the last 72 hours. Fasting Lipid Panel No results for input(s): CHOL, HDL, LDLCALC, TRIG, CHOLHDL, LDLDIRECT in the last 72 hours. Thyroid Function Tests No results for input(s): TSH, T4TOTAL, T3FREE, THYROIDAB in the last 72 hours.  Invalid input(s): FREET3  Other results:   Imaging    No results found.   Medications:     Scheduled Medications: . collagenase   Topical Daily  . digoxin  0.125 mg Oral Daily  . feeding supplement (ENSURE ENLIVE)  237 mL Oral TID BM  . feeding supplement (PRO-STAT SUGAR FREE 64)  30 mL Oral BID  . Gerhardt's butt cream   Topical Daily  . heparin injection (subcutaneous)  5,000 Units Subcutaneous Q8H  . isosorbide-hydrALAZINE  0.5 tablet Oral TID  . losartan  12.5 mg Oral Daily  . multivitamin with minerals  1 tablet Oral Daily  . pantoprazole  40 mg Oral Daily  . sodium chloride flush  10-40 mL Intracatheter Q12H  . spironolactone  12.5 mg Oral Daily  . thiamine  100 mg Per Tube Daily  . torsemide  20 mg Oral QODAY    Infusions: . sodium chloride 250 mL (08/28/18 0014)    PRN Medications: acetaminophen (TYLENOL) oral liquid 160 mg/5 mL, acetaminophen, lip balm, ondansetron (ZOFRAN) IV, polyethylene glycol, senna, sodium chloride flush, sodium chloride flush   Assessment/Plan   1. 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  He was markedly volume overloaded at admission with suspected mixed septic/cardiogenic shock.  Echo 4/3 with severe biventricular failure, LVEF 20-25% small effusion, moderate to severe MR and severe TR.  He is now off dobutamine and well-diuresed.    - Continue torsemide 20mg  every other day. - Continue Bidil 1/2 tab tid.   - Continue digoxin 0.125 daily, level ok today.  - Re-try low dose spironolactone 12.5 daily, follow K closely.   - Continue losartan 12.5 mg daily.   - He is not going to be a candidate for advanced therapies.  LVAD likely not an option regardless with severe RV dysfunction and has had very poor compliance with medical therapy.  2. Chest pain: Doubt ischemic. Trop negative. ECG with non-specific repol abnormalities. Cath 2016 no CAD. No further CP. 3. ID: Suspect component of septic shock, PCT 6.76.  Citrobacter freundii in blood initially. ?Source from lower leg/foot skin breakdown or stool. Febrile again am 4/9. Afebrile. WBC 15. Repeat cultures NGTD. Now off antibiotics.  4. AKI: Concern for ATN in setting of shock.  Was down at home for an undetermined length of time.  Initially required CVVH, now off. Resolved with creatinine now back to baseline.  5. Neuro: Confusion/delirium initially, likely in setting of critical illness/infection. Head CT 4/4 with no acute changes. Passed swallow study.  Aphonia resolved.  6. Anemia: Received 1 unit PRBCs on 08/30/18. Hgb 8.5 today, no overt bleeding.  7. Vascular: ABIs normal.  Suspect skin breakdown on foot related to venous stasis and marked swelling initially.   No change.  8. FEN: Severe protein calorie malnutrition. Received short term tube feeds with Cor-trak which was removed 4/14.  9. Hyperkalemia: Resolved.  Will retry low dose spironolactone and follow K closely.  10. LE wounds and sacral decub: WOC has seen for d/c recommendations. No change.  11.   Deconditioning: Severe.  Out of bed, PT/OT.  Will need SNF - see below. No change.  12. Disposition: Prognosis quite poor. He is ready for SNF placement when bed available. This is only remaining issue.  - Social worker addressing   Length of Stay: 26  Loralie Champagne, MD  10:00 AM  Advanced Heart Failure Team Pager [phone] (M-F; 7a - 4p)  Please contact [facility] for night-coverage after hours (4p -7a ) and weekends on [email or web address]

## 2018-09-12 LAB — CBC WITH DIFFERENTIAL/PLATELET
Abs Immature Granulocytes: 0.1 10*3/uL — ABNORMAL HIGH (ref 0.00–0.07)
Basophils Absolute: 0.1 10*3/uL (ref 0.0–0.1)
Basophils Relative: 1 %
Eosinophils Absolute: 0.3 10*3/uL (ref 0.0–0.5)
Eosinophils Relative: 3 %
HCT: 26 % — ABNORMAL LOW (ref 39.0–52.0)
Hemoglobin: 8.2 g/dL — ABNORMAL LOW (ref 13.0–17.0)
Immature Granulocytes: 1 %
Lymphocytes Relative: 15 %
Lymphs Abs: 1.7 10*3/uL (ref 0.7–4.0)
MCH: 29.3 pg (ref 26.0–34.0)
MCHC: 31.5 g/dL (ref 30.0–36.0)
MCV: 92.9 fL (ref 80.0–100.0)
Monocytes Absolute: 1.3 10*3/uL — ABNORMAL HIGH (ref 0.1–1.0)
Monocytes Relative: 12 %
Neutro Abs: 7.6 10*3/uL (ref 1.7–7.7)
Neutrophils Relative %: 68 %
Platelets: 563 10*3/uL — ABNORMAL HIGH (ref 150–400)
RBC: 2.8 MIL/uL — ABNORMAL LOW (ref 4.22–5.81)
RDW: 18 % — ABNORMAL HIGH (ref 11.5–15.5)
WBC: 10.9 10*3/uL — ABNORMAL HIGH (ref 4.0–10.5)
nRBC: 0 % (ref 0.0–0.2)

## 2018-09-12 LAB — BASIC METABOLIC PANEL
Anion gap: 8 (ref 5–15)
BUN: 37 mg/dL — ABNORMAL HIGH (ref 6–20)
CO2: 28 mmol/L (ref 22–32)
Calcium: 8.7 mg/dL — ABNORMAL LOW (ref 8.9–10.3)
Chloride: 97 mmol/L — ABNORMAL LOW (ref 98–111)
Creatinine, Ser: 1.07 mg/dL (ref 0.61–1.24)
GFR calc Af Amer: >60 mL/min (ref >60)
GFR calc non Af Amer: >60 mL/min (ref >60)
Glucose, Bld: 115 mg/dL — ABNORMAL HIGH (ref 70–99)
Potassium: 4.4 mmol/L (ref 3.5–5.1)
Sodium: 133 mmol/L — ABNORMAL LOW (ref 135–145)

## 2018-09-12 LAB — GLUCOSE, CAPILLARY: Glucose-Capillary: 78 mg/dL (ref 70–99)

## 2018-09-12 NOTE — TOC Progression Note (Addendum)
Transition of Care [facility]) - Progression Note    Patient Details  Name: Qusay Villada MRN: [id number] Date of Birth: 1965-12-02  Transition of Care [facility]) CM/SW [place], [place] Phone Number: [phone] 09/12/2018, 3:45 PM  Clinical Narrative:     Patient has no bed offers.   [facility] Admission Coordinator, states they are waiting on the Medicaid Application to be filed and receive Medicaid Pending number before making decision. CSW informed per Financial Counselor, Velora Heckler the application will be filed on Friday. CSW will update the SNF once the information is available. CSW emailed [facility]  and requested pending Medicaid number once the application has been submitted. CSW will continue to follow and assist with discharge planning.   Thurmond Butts, MSW, [facility] Clinical Social Worker [phone]    Expected Discharge Plan: Skilled Nursing Facility Barriers to Discharge: SNF Pending payor source - LOG, SNF Pending bed offer  Expected Discharge Plan and Services Expected Discharge Plan: [place]                                               Social Determinants of Health (SDOH) Interventions    Readmission Risk Interventions Readmission Risk Prevention Plan 08/28/2018  Transportation Screening Complete  PCP or Specialist Appt within 3-5 Days Complete  HRI or [place] Complete  Social Work Consult for [place] Planning/Counseling Complete  Palliative Care Screening Not Applicable  Medication Review Press photographer) Complete  Some recent data might be hidden

## 2018-09-12 NOTE — Progress Notes (Signed)
Patient ID: Kyle Conrad, male   DOB: Jun 16, 1965, 53 y.o.   MRN: [id number]     Advanced Heart Failure Rounding Note  PCP-Cardiologist: No primary care provider on file.   Subjective:    SBP 90s.  Weight slightly up.  No dyspnea.   Pending SNF bed when available.  Objective:   Weight Range: 63.9 kg Body mass index is 18.59 kg/m.   Vital Signs:   Temp:  [98.2 F (36.8 C)-99.3 F (37.4 C)] 98.4 F (36.9 C) (04/29 0300) Pulse Rate:  [84-98] 88 (04/29 0847) Resp:  [12-19] 12 (04/29 0300) BP: (85-103)/(61-68) 99/68 (04/29 0847) SpO2:  [99 %-100 %] 100 % (04/29 0847) Weight:  [63.9 kg] 63.9 kg (04/29 0300) Last BM Date: 09/11/18  Weight change: Filed Weights   09/10/18 0500 09/11/18 0503 09/12/18 0300  Weight: 62.9 kg 63 kg 63.9 kg    Intake/Output:   Intake/Output Summary (Last 24 hours) at 09/12/2018 0923 Last data filed at 09/12/2018 0730 Gross per 24 hour  Intake 967 ml  Output 1600 ml  Net -633 ml      Physical Exam   General: Cachectic Neck: No JVD, no thyromegaly or thyroid nodule.  Lungs: Clear to auscultation bilaterally with normal respiratory effort. CV: Lateral PMI.  Heart regular S1/S2, no S3/S4, 1/6 HSM apex.  No peripheral edema.   Abdomen: Soft, nontender, no hepatosplenomegaly, no distention.  Skin: Intact without lesions or rashes.  Neurologic: Alert and oriented x 3.  Psych: Normal affect. Extremities: No clubbing or cyanosis. Left lower leg and thigh wrapped.  HEENT: Normal.     Telemetry   NSR 90s. Personally reviewed   Labs    CBC Recent Labs    09/11/18 0445 09/12/18 0517  WBC 13.4* 10.9*  NEUTROABS 10.0* 7.6  HGB 8.5* 8.2*  HCT 27.1* 26.0*  MCV 92.8 92.9  PLT 566* 149*   Basic Metabolic Panel Recent Labs    09/10/18 0444 09/11/18 0445  NA 134* 132*  K 4.6 4.4  CL 98 97*  CO2 26 27  GLUCOSE 97 82  BUN 30* 37*  CREATININE 1.12 1.15  CALCIUM 9.1 8.9   Liver Function Tests No results for input(s): AST, ALT,  ALKPHOS, BILITOT, PROT, ALBUMIN in the last 72 hours. No results for input(s): LIPASE, AMYLASE in the last 72 hours. Cardiac Enzymes No results for input(s): CKTOTAL, CKMB, CKMBINDEX, TROPONINI in the last 72 hours.  BNP: BNP (last 3 results) Recent Labs    03/24/18 1435 06/05/18 1411 08/16/18 1611  BNP 2,471.9* 3,961.4* 2,433.3*    ProBNP (last 3 results) No results for input(s): PROBNP in the last 8760 hours.   D-Dimer No results for input(s): DDIMER in the last 72 hours. Hemoglobin A1C No results for input(s): HGBA1C in the last 72 hours. Fasting Lipid Panel No results for input(s): CHOL, HDL, LDLCALC, TRIG, CHOLHDL, LDLDIRECT in the last 72 hours. Thyroid Function Tests No results for input(s): TSH, T4TOTAL, T3FREE, THYROIDAB in the last 72 hours.  Invalid input(s): FREET3  Other results:   Imaging    No results found.   Medications:     Scheduled Medications: . collagenase   Topical Daily  . digoxin  0.125 mg Oral Daily  . feeding supplement (ENSURE ENLIVE)  237 mL Oral TID BM  . feeding supplement (PRO-STAT SUGAR FREE 64)  30 mL Oral BID  . Gerhardt's butt cream   Topical Daily  . heparin injection (subcutaneous)  5,000 Units Subcutaneous Q8H  . isosorbide-hydrALAZINE  0.5 tablet Oral TID  . losartan  12.5 mg Oral Daily  . multivitamin with minerals  1 tablet Oral Daily  . pantoprazole  40 mg Oral Daily  . sodium chloride flush  10-40 mL Intracatheter Q12H  . spironolactone  12.5 mg Oral Daily  . thiamine  100 mg Per Tube Daily  . torsemide  20 mg Oral QODAY    Infusions: . sodium chloride 250 mL (08/28/18 0014)    PRN Medications: acetaminophen (TYLENOL) oral liquid 160 mg/5 mL, acetaminophen, lip balm, ondansetron (ZOFRAN) IV, polyethylene glycol, senna, sodium chloride flush, sodium chloride flush   Assessment/Plan   1. 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  He was markedly volume overloaded at admission with suspected mixed septic/cardiogenic shock.  Echo 4/3 with severe biventricular failure, LVEF 20-25% small effusion, moderate to severe MR and severe TR.  He is now off dobutamine and well-diuresed.  No BP room to titrate his meds today.  - Continue torsemide 20mg  every other day, pending BMET today. - Continue Bidil 1/2 tab tid.   - Continue digoxin 0.125 daily.  - Continue low dose spironolactone 12.5 daily, awaiting BMET today.   - Continue losartan 12.5 mg daily.   - He is not going to be a candidate for advanced therapies.  LVAD likely not an option regardless with severe RV dysfunction and has had very poor compliance with medical therapy.  2. Chest pain: Doubt ischemic. Trop negative. ECG with non-specific repol abnormalities. Cath 2016 no CAD. No further CP. 3. ID: Suspect component of septic shock, PCT 6.76.  Citrobacter freundii in blood initially. ?Source from lower leg/foot skin breakdown or stool. Febrile again am 4/9. Afebrile. WBC 15. Repeat cultures NGTD. Now off antibiotics.  4. AKI: Concern for ATN in setting of shock.  Was down at home for an undetermined length of time.  Initially required CVVH, now off. Resolved with creatinine now back to baseline.  5. Neuro: Confusion/delirium initially, likely in setting of critical illness/infection. Head CT 4/4 with no acute changes. Passed swallow study.  Aphonia resolved.  6. Anemia: Received 1 unit PRBCs on 08/30/18. Hgb 8.5 today, no overt bleeding.  7. Vascular: ABIs normal.  Suspect skin breakdown on foot related to venous stasis and marked swelling initially.    - Daily dressing changes.  8. FEN: Severe protein calorie malnutrition. Received short term tube feeds with Cor-trak which was removed 4/14.  9. Hyperkalemia: Resolved.  Will retry low dose spironolactone and follow K closely.  10. LE wounds and sacral  decub: WOC has seen for d/c recommendations. No change.  11.  Deconditioning: Severe.  Out of bed, PT/OT.  Will need SNF - see below. No change.  12. Disposition: Prognosis quite poor. He is ready for SNF placement when bed available. This is only remaining issue.  - Social worker addressing   Length of Stay: 27  Loralie Champagne, MD  9:23 AM  Advanced Heart Failure Team Pager [phone] (M-F; 7a - 4p)  Please contact [facility] for night-coverage after hours (4p -7a ) and weekends on [email or web address]

## 2018-09-12 NOTE — Progress Notes (Signed)
Physical Therapy Treatment Patient Details Name: Kyle Conrad MRN: [id number] DOB: 03-14-1966 Today's Date: 09/12/2018    History of Present Illness Pt is a 53 y.o. male admitted 08/16/18 with AMS after being found down by neighbor; treated for cardiogenic shock, septic shock with citrobacter bactermeia. Also with AKI; on CRRT 4/3-4/8. Has remained encephalopathic throughout admission. Head CT 4/4 with no acute changes. PMH includes biventricular HF, HTN.    PT Comments    Emphasis on spending increasing amounts of time on his feet.  Working on balance and gait stability/stamina.    Follow Up Recommendations  SNF;Supervision/Assistance - 24 hour     Equipment Recommendations  Other (comment)(TBA next venue)    Recommendations for Other Services       Precautions / Restrictions Precautions Precautions: Fall Restrictions Weight Bearing Restrictions: No    Mobility  Bed Mobility Overal bed mobility: Needs Assistance         Sit to supine: Supervision   General bed mobility comments: slow moving, but no assist or rail needed  Transfers Overall transfer level: Needs assistance Equipment used: Rolling walker (2 wheeled) Transfers: Sit to/from Stand Sit to Stand: Min assist         General transfer comment: from lower surface  Ambulation/Gait Ambulation/Gait assistance: Min guard Gait Distance (Feet): 500 Feet Assistive device: Rolling walker (2 wheeled);None Gait Pattern/deviations: Step-through pattern Gait velocity: slower to moderate. Gait velocity interpretation: 1.31 - 2.62 ft/sec, indicative of limited community ambulator General Gait Details: wide BOS, but improving heel/toe gait pattern.  Still slow, but noticeably more steady   Stairs             Wheelchair Mobility    Modified Rankin (Stroke Patients Only)       Balance     Sitting balance-[name]: Good       Standing balance-[name]: Fair                               Cognition Arousal/Alertness: Awake/alert Behavior During Therapy: WFL for tasks assessed/performed Overall Cognitive Status: Within Functional Limits for tasks assessed                             Awareness: Emergent          Exercises      General Comments General comments (skin integrity, edema, etc.): vss      Pertinent Vitals/Pain Pain Assessment: Faces Faces Pain Scale: Hurts little more Pain Location: L LEG Pain Descriptors / Indicators: Grimacing;Guarding Pain Intervention(s): Monitored during session    Home Living                      Prior Function            PT Goals (current goals can now be found in the care plan section) Acute Rehab PT Goals Patient Stated Goal: Moving around easier. PT Goal Formulation: With patient Time For Goal Achievement: 09/19/18 Potential to Achieve Goals: Fair Progress towards PT goals: Progressing toward goals    Frequency    Min 2X/week      PT Plan Current plan remains appropriate    Co-evaluation              AM-PAC PT "6 Clicks" Mobility   Outcome Measure  Help needed turning from your back to your side while in a flat bed without using  bedrails?: A Little Help needed moving from lying on your back to sitting on the side of a flat bed without using bedrails?: A Little Help needed moving to and from a bed to a chair (including a wheelchair)?: A Little Help needed standing up from a chair using your arms (e.g., wheelchair or bedside chair)?: A Little Help needed to walk in hospital room?: A Little Help needed climbing 3-5 steps with a railing? : A Lot 6 Click Score: 17    End of Session   Activity Tolerance: Patient tolerated treatment well Patient left: in bed;with call bell/phone within reach;with bed alarm set Nurse Communication: Mobility status PT Visit Diagnosis: Other abnormalities of gait and mobility (R26.89)     Time: 1683-7290 PT Time Calculation (min)  (ACUTE ONLY): 21 min  Charges:  $Gait Training: 8-22 mins                     09/12/2018  Donnella Sham, PT Acute [place] [phone]  (pager) [phone]  (office)   Tessie Fass Earlyne Feeser 09/12/2018, 6:13 PM

## 2018-09-13 LAB — BASIC METABOLIC PANEL
Anion gap: 9 (ref 5–15)
BUN: 41 mg/dL — ABNORMAL HIGH (ref 6–20)
CO2: 28 mmol/L (ref 22–32)
Calcium: 9 mg/dL (ref 8.9–10.3)
Chloride: 97 mmol/L — ABNORMAL LOW (ref 98–111)
Creatinine, Ser: 1.28 mg/dL — ABNORMAL HIGH (ref 0.61–1.24)
GFR calc Af Amer: >60 mL/min (ref >60)
GFR calc non Af Amer: >60 mL/min (ref >60)
Glucose, Bld: 95 mg/dL (ref 70–99)
Potassium: 4.5 mmol/L (ref 3.5–5.1)
Sodium: 134 mmol/L — ABNORMAL LOW (ref 135–145)

## 2018-09-13 LAB — CBC WITH DIFFERENTIAL/PLATELET
Abs Immature Granulocytes: 0.1 10*3/uL — ABNORMAL HIGH (ref 0.00–0.07)
Basophils Absolute: 0.1 10*3/uL (ref 0.0–0.1)
Basophils Relative: 1 %
Eosinophils Absolute: 0.3 10*3/uL (ref 0.0–0.5)
Eosinophils Relative: 2 %
HCT: 25.9 % — ABNORMAL LOW (ref 39.0–52.0)
Hemoglobin: 8.1 g/dL — ABNORMAL LOW (ref 13.0–17.0)
Immature Granulocytes: 1 %
Lymphocytes Relative: 16 %
Lymphs Abs: 1.7 10*3/uL (ref 0.7–4.0)
MCH: 29.2 pg (ref 26.0–34.0)
MCHC: 31.3 g/dL (ref 30.0–36.0)
MCV: 93.5 fL (ref 80.0–100.0)
Monocytes Absolute: 1.3 10*3/uL — ABNORMAL HIGH (ref 0.1–1.0)
Monocytes Relative: 12 %
Neutro Abs: 7.3 10*3/uL (ref 1.7–7.7)
Neutrophils Relative %: 68 %
Platelets: 631 10*3/uL — ABNORMAL HIGH (ref 150–400)
RBC: 2.77 MIL/uL — ABNORMAL LOW (ref 4.22–5.81)
RDW: 18 % — ABNORMAL HIGH (ref 11.5–15.5)
WBC: 10.8 10*3/uL — ABNORMAL HIGH (ref 4.0–10.5)
nRBC: 0 % (ref 0.0–0.2)

## 2018-09-13 NOTE — Progress Notes (Signed)
Patient ID: Kyle Conrad, male   DOB: Jan 09, 1966, 53 y.o.   MRN: [id number]     Advanced Heart Failure Rounding Note  PCP-Cardiologist: No primary care provider on file.   Subjective:    Stable weight.  No complaints today.   Pending SNF bed when available.  Objective:   Weight Range: 63.8 kg Body mass index is 18.56 kg/m.   Vital Signs:   Temp:  [98.3 F (36.8 C)-99.7 F (37.6 C)] 98.9 F (37.2 C) (04/30 0452) Pulse Rate:  [88] 88 (04/29 0847) Resp:  [16-24] 16 (04/30 0452) BP: (89-123)/(51-73) 123/73 (04/30 0452) SpO2:  [100 %] 100 % (04/30 0452) Weight:  [63.8 kg] 63.8 kg (04/30 0452) Last BM Date: 09/12/18  Weight change: Filed Weights   09/11/18 0503 09/12/18 0300 09/13/18 0452  Weight: 63 kg 63.9 kg 63.8 kg    Intake/Output:   Intake/Output Summary (Last 24 hours) at 09/13/2018 0738 Last data filed at 09/12/2018 2349 Gross per 24 hour  Intake 10 ml  Output 925 ml  Net -915 ml      Physical Exam   General: cachectic Neck: JVP 7-8 cm, no thyromegaly or thyroid nodule.  Lungs: Clear to auscultation bilaterally with normal respiratory effort. CV: Nondisplaced PMI.  Heart regular S1/S2, no S3/S4, 2/6 HSM apex.  No peripheral edema.   Abdomen: Soft, nontender, no hepatosplenomegaly, no distention.  Skin: Intact without lesions or rashes.  Neurologic: Alert and oriented x 3.  Psych: Normal affect. Extremities: No clubbing or cyanosis. Left lower leg and thigh wrapped.  HEENT: Normal.   Telemetry   NSR 90s. Personally reviewed   Labs    CBC Recent Labs    09/12/18 0517 09/13/18 0531  WBC 10.9* 10.8*  NEUTROABS 7.6 7.3  HGB 8.2* 8.1*  HCT 26.0* 25.9*  MCV 92.9 93.5  PLT 563* 720*   Basic Metabolic Panel Recent Labs    09/12/18 1022 09/13/18 0531  NA 133* 134*  K 4.4 4.5  CL 97* 97*  CO2 28 28  GLUCOSE 115* 95  BUN 37* 41*  CREATININE 1.07 1.28*  CALCIUM 8.7* 9.0   Liver Function Tests No results for input(s): AST, ALT, ALKPHOS,  BILITOT, PROT, ALBUMIN in the last 72 hours. No results for input(s): LIPASE, AMYLASE in the last 72 hours. Cardiac Enzymes No results for input(s): CKTOTAL, CKMB, CKMBINDEX, TROPONINI in the last 72 hours.  BNP: BNP (last 3 results) Recent Labs    03/24/18 1435 06/05/18 1411 08/16/18 1611  BNP 2,471.9* 3,961.4* 2,433.3*    ProBNP (last 3 results) No results for input(s): PROBNP in the last 8760 hours.   D-Dimer No results for input(s): DDIMER in the last 72 hours. Hemoglobin A1C No results for input(s): HGBA1C in the last 72 hours. Fasting Lipid Panel No results for input(s): CHOL, HDL, LDLCALC, TRIG, CHOLHDL, LDLDIRECT in the last 72 hours. Thyroid Function Tests No results for input(s): TSH, T4TOTAL, T3FREE, THYROIDAB in the last 72 hours.  Invalid input(s): FREET3  Other results:   Imaging    No results found.   Medications:     Scheduled Medications: . collagenase   Topical Daily  . digoxin  0.125 mg Oral Daily  . feeding supplement (ENSURE ENLIVE)  237 mL Oral TID BM  . feeding supplement (PRO-STAT SUGAR FREE 64)  30 mL Oral BID  . Gerhardt's butt cream   Topical Daily  . heparin injection (subcutaneous)  5,000 Units Subcutaneous Q8H  . isosorbide-hydrALAZINE  0.5 tablet Oral TID  .  multivitamin with minerals  1 tablet Oral Daily  . pantoprazole  40 mg Oral Daily  . sodium chloride flush  10-40 mL Intracatheter Q12H  . spironolactone  12.5 mg Oral Daily  . thiamine  100 mg Per Tube Daily  . torsemide  20 mg Oral QODAY    Infusions: . sodium chloride 250 mL (08/28/18 0014)    PRN Medications: acetaminophen (TYLENOL) oral liquid 160 mg/5 mL, acetaminophen, lip balm, ondansetron (ZOFRAN) IV, polyethylene glycol, senna, sodium chloride flush, sodium chloride flush   Assessment/Plan   1. 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  He was markedly volume overloaded at admission with suspected mixed septic/cardiogenic shock.  Echo 4/3 with severe biventricular failure, LVEF 20-25% small effusion, moderate to severe MR and severe TR.  He is now off dobutamine and well-diuresed.  BUN/creatinine mildly higher.  Volume looks ok.  - Continue torsemide 20mg  every other day - Continue Bidil 1/2 tab tid.   - Continue digoxin 0.125 daily.  - Continue low dose spironolactone 12.5 daily   - Stop losartan with mild rise in BUN/creatinine and soft BP.   - He is not going to be a candidate for advanced therapies.  LVAD likely not an option regardless with severe RV dysfunction and has had very poor compliance with medical therapy.  2. Chest pain: Doubt ischemic. Trop negative. ECG with non-specific repol abnormalities. Cath 2016 no CAD. No further CP. 3. ID: Suspect component of septic shock, PCT 6.76.  Citrobacter freundii in blood initially. ?Source from lower leg/foot skin breakdown or stool. Febrile again am 4/9. Afebrile. WBC 15. Repeat cultures NGTD. Now off antibiotics.  4. AKI: Concern for ATN in setting of shock.  Was down at home for an undetermined length of time.  Initially required CVVH, now off. Resolved with creatinine now back to baseline. Mild rise in BUN/creatinine, would stop losartan as above.  5. Neuro: Confusion/delirium initially, likely in setting of critical illness/infection. Head CT 4/4 with no acute changes. Passed swallow study.  Aphonia resolved.  6. Anemia: Received 1 unit PRBCs on 08/30/18. Hgb 8.1 today, no overt bleeding.  7. Vascular: ABIs normal.  Suspect skin breakdown on foot related to venous stasis and marked swelling initially.    - Daily dressing changes.  8. FEN: Severe protein calorie malnutrition. Received short term tube feeds with Cor-trak which was removed 4/14.  9. Hyperkalemia: Resolved.  Will retry low dose spironolactone and follow K closely.   10. LE wounds and sacral decub: WOC has seen for d/c recommendations. No change.  11.  Deconditioning: Severe.  Out of bed, PT/OT.  Will need SNF - see below. No change.  12. Disposition: Prognosis quite poor. He is ready for SNF placement when bed available. This is only remaining issue.  - Still waiting for placement.    Length of Stay: 12  Loralie Champagne, MD  7:38 AM  Advanced Heart Failure Team Pager [phone] (M-F; 7a - 4p)  Please contact [facility] for night-coverage after hours (4p -7a ) and weekends on [email or web address]

## 2018-09-13 NOTE — TOC Progression Note (Signed)
Transition of Care [facility]) - Progression Note    Patient Details  Name: Kyle Conrad MRN: [id number] Date of Birth: June 08, 1965  Transition of Care [facility]) CM/SW [place], [place] Phone Number: [phone] 09/13/2018, 2:38 PM  Clinical Narrative:    Patient has no ned offers-   CSW continues to follow and seek placement. CSW will follow up Financial Counselor, Crystal to confirm if Medicaid application was submitted.   Thurmond Butts, MSW, [facility] Clinical Social Worker [phone]    Expected Discharge Plan: Skilled Nursing Facility Barriers to Discharge: SNF Pending payor source - LOG, SNF Pending bed offer  Expected Discharge Plan and Services Expected Discharge Plan: [place]                                               Social Determinants of Health (SDOH) Interventions    Readmission Risk Interventions Readmission Risk Prevention Plan 08/28/2018  Transportation Screening Complete  PCP or Specialist Appt within 3-5 Days Complete  HRI or [place] Complete  Social Work Consult for [place] Planning/Counseling Complete  Palliative Care Screening Not Applicable  Medication Review Press photographer) Complete  Some recent data might be hidden

## 2018-09-14 LAB — CBC WITH DIFFERENTIAL/PLATELET
Abs Immature Granulocytes: 0.06 10*3/uL (ref 0.00–0.07)
Basophils Absolute: 0.1 10*3/uL (ref 0.0–0.1)
Basophils Relative: 1 %
Eosinophils Absolute: 0.3 10*3/uL (ref 0.0–0.5)
Eosinophils Relative: 3 %
HCT: 27.3 % — ABNORMAL LOW (ref 39.0–52.0)
Hemoglobin: 8.5 g/dL — ABNORMAL LOW (ref 13.0–17.0)
Immature Granulocytes: 1 %
Lymphocytes Relative: 15 %
Lymphs Abs: 1.5 10*3/uL (ref 0.7–4.0)
MCH: 29.2 pg (ref 26.0–34.0)
MCHC: 31.1 g/dL (ref 30.0–36.0)
MCV: 93.8 fL (ref 80.0–100.0)
Monocytes Absolute: 1.3 10*3/uL — ABNORMAL HIGH (ref 0.1–1.0)
Monocytes Relative: 13 %
Neutro Abs: 6.6 10*3/uL (ref 1.7–7.7)
Neutrophils Relative %: 67 %
Platelets: 657 10*3/uL — ABNORMAL HIGH (ref 150–400)
RBC: 2.91 MIL/uL — ABNORMAL LOW (ref 4.22–5.81)
RDW: 18 % — ABNORMAL HIGH (ref 11.5–15.5)
WBC: 9.8 10*3/uL (ref 4.0–10.5)
nRBC: 0 % (ref 0.0–0.2)

## 2018-09-14 LAB — BASIC METABOLIC PANEL
Anion gap: 7 (ref 5–15)
BUN: 45 mg/dL — ABNORMAL HIGH (ref 6–20)
CO2: 32 mmol/L (ref 22–32)
Calcium: 9.2 mg/dL (ref 8.9–10.3)
Chloride: 96 mmol/L — ABNORMAL LOW (ref 98–111)
Creatinine, Ser: 1.16 mg/dL (ref 0.61–1.24)
GFR calc Af Amer: >60 mL/min (ref >60)
GFR calc non Af Amer: >60 mL/min (ref >60)
Glucose, Bld: 94 mg/dL (ref 70–99)
Potassium: 4.7 mmol/L (ref 3.5–5.1)
Sodium: 135 mmol/L (ref 135–145)

## 2018-09-14 NOTE — Progress Notes (Signed)
Occupational Therapy Treatment Patient Details Name: Kyle Conrad MRN: [id number] DOB: 28-Dec-1965 Today's Date: 09/14/2018    History of present illness Pt is a 53 y.o. male admitted 08/16/18 with AMS after being found down by neighbor; treated for cardiogenic shock, septic shock with citrobacter bactermeia. Also with AKI; on CRRT 4/3-4/8. Has remained encephalopathic throughout admission. Head CT 4/4 with no acute changes. PMH includes biventricular HF, HTN.   OT comments  Pt immediately willing to work with OT on ADL training and agreed to remain up chair.   Follow Up Recommendations  SNF;Supervision/Assistance - 24 hour    Equipment Recommendations  None recommended by OT    Recommendations for Other Services      Precautions / Restrictions Precautions Precautions: Fall Restrictions Weight Bearing Restrictions: No       Mobility Bed Mobility Overal bed mobility: Needs Assistance Bed Mobility: Supine to Sit     Supine to sit: Supervision     General bed mobility comments: slow moving, but no assist or rail needed  Transfers Overall transfer level: Needs assistance Equipment used: Rolling walker (2 wheeled) Transfers: Sit to/from [employer] Transfers Sit to Stand: Supervision Stand pivot transfers: Supervision       General transfer comment: from commode to chair, increased time    Balance Overall balance assessment: Needs assistance   Sitting balance-[name]: Good       Standing balance-[name]: Poor Standing balance comment: reaching for BSC with transfer, requiring one hand support to transfer                           ADL either performed or assessed with clinical judgement   ADL Overall ADL's : Needs assistance/impaired     Grooming: Wash/dry hands;Wash/dry face;Oral care;Set up;Sitting               Lower Body Dressing: Set up;Bed level Lower Body Dressing Details (indicate cue type and reason): donned socks in  long sitting Toilet Transfer: Min guard;Stand-pivot;BSC   Toileting- Clothing Manipulation and Hygiene: Total assistance;Sit to/from stand               Vision       Perception     Praxis      Cognition Arousal/Alertness: Awake/alert Behavior During Therapy: WFL for tasks assessed/performed Overall Cognitive Status: Within Functional Limits for tasks assessed                                          Exercises     Shoulder Instructions       General Comments      Pertinent Vitals/ Pain       Pain Assessment: Faces Faces Pain Scale: Hurts little more Pain Location: all over Pain Descriptors / Indicators: Grimacing;Guarding Pain Intervention(s): Monitored during session;Patient requesting pain meds-RN notified  Home Living                                          Prior Functioning/Environment              Frequency  Min 2X/week        Progress Toward Goals  OT Goals(current goals can now be found in the care plan section)  Progress towards OT goals: Progressing  toward goals  Acute Rehab OT Goals Patient Stated Goal: Moving around easier. OT Goal Formulation: With patient Time For Goal Achievement: 09/21/18 Potential to Achieve Goals: Good  Plan Discharge plan remains appropriate;Frequency remains appropriate    Co-evaluation                 AM-PAC OT "6 Clicks" Daily Activity     Outcome Measure   Help from another person eating meals?: None Help from another person taking care of personal grooming?: A Little Help from another person toileting, which includes using toliet, bedpan, or urinal?: A Lot Help from another person bathing (including washing, rinsing, drying)?: A Little Help from another person to put on and taking off regular upper body clothing?: A Little Help from another person to put on and taking off regular lower body clothing?: A Little 6 Click Score: 18    End of Session  Equipment Utilized During Treatment: Rolling walker  OT Visit Diagnosis: Unsteadiness on feet (R26.81);Other abnormalities of gait and mobility (R26.89);Muscle weakness (generalized) (M62.81);Other symptoms and signs involving cognitive function;Adult, failure to thrive (R62.7)   Activity Tolerance Patient tolerated treatment well   Patient Left Other (comment)(on commode with call button)   Nurse Communication Patient requests pain meds        Time: 0233-4356 OT Time Calculation (min): 53 min  Charges: OT General Charges $OT Visit: 1 Visit OT Treatments $Self Care/Home Management : 53-67 mins  [name], OTR/L Acute Rehabilitation Services Pager: [phone] Office: [phone]   Malka So 09/14/2018, 10:01 AM

## 2018-09-14 NOTE — Progress Notes (Signed)
Patient ID: Kyle Conrad, male   DOB: [birth year], 53 y.o.   MRN: [id number]     Advanced Heart Failure Rounding Note  PCP-Cardiologist: No primary care provider on file.   Subjective:    Stable weight.  No complaints today.  BUN higher.   Pending SNF bed when available.  Objective:   Weight Range: 63.8 kg Body mass index is 18.56 kg/m.   Vital Signs:   Temp:  [98 F (36.7 C)-99 F (37.2 C)] 98.3 F (36.8 C) (05/01 0753) Pulse Rate:  [81-107] 96 (05/01 0317) Resp:  [15-17] 17 (05/01 0317) BP: (88-113)/(62-80) 109/77 (05/01 0317) SpO2:  [99 %-100 %] 100 % (05/01 0317) Last BM Date: 09/12/18  Weight change: Filed Weights   09/11/18 0503 09/12/18 0300 09/13/18 0452  Weight: 63 kg 63.9 kg 63.8 kg    Intake/Output:   Intake/Output Summary (Last 24 hours) at 09/14/2018 0810 Last data filed at 09/14/2018 0735 Gross per 24 hour  Intake 10 ml  Output 2050 ml  Net -2040 ml      Physical Exam   General: Cachectic Neck: No JVD, no thyromegaly or thyroid nodule.  Lungs: Clear to auscultation bilaterally with normal respiratory effort. CV: Nondisplaced PMI.  Heart regular S1/S2, no S3/S4, 2/6 HSM apex.  No peripheral edema.   Abdomen: Soft, nontender, no hepatosplenomegaly, no distention.  Skin: Intact without lesions or rashes.  Neurologic: Alert and oriented x 3.  Psych: Normal affect. Extremities: No clubbing or cyanosis.  HEENT: Normal.    Telemetry   NSR 90s. Personally reviewed   Labs    CBC Recent Labs    09/13/18 0531 09/14/18 0500  WBC 10.8* 9.8  NEUTROABS 7.3 6.6  HGB 8.1* 8.5*  HCT 25.9* 27.3*  MCV 93.5 93.8  PLT 631* 384*   Basic Metabolic Panel Recent Labs    09/13/18 0531 09/14/18 0500  NA 134* 135  K 4.5 4.7  CL 97* 96*  CO2 28 32  GLUCOSE 95 94  BUN 41* 45*  CREATININE 1.28* 1.16  CALCIUM 9.0 9.2   Liver Function Tests No results for input(s): AST, ALT, ALKPHOS, BILITOT, PROT, ALBUMIN in the last 72 hours. No results for  input(s): LIPASE, AMYLASE in the last 72 hours. Cardiac Enzymes No results for input(s): CKTOTAL, CKMB, CKMBINDEX, TROPONINI in the last 72 hours.  BNP: BNP (last 3 results) Recent Labs    03/24/18 1435 06/05/18 1411 08/16/18 1611  BNP 2,471.9* 3,961.4* 2,433.3*    ProBNP (last 3 results) No results for input(s): PROBNP in the last 8760 hours.   D-Dimer No results for input(s): DDIMER in the last 72 hours. Hemoglobin A1C No results for input(s): HGBA1C in the last 72 hours. Fasting Lipid Panel No results for input(s): CHOL, HDL, LDLCALC, TRIG, CHOLHDL, LDLDIRECT in the last 72 hours. Thyroid Function Tests No results for input(s): TSH, T4TOTAL, T3FREE, THYROIDAB in the last 72 hours.  Invalid input(s): FREET3  Other results:   Imaging    No results found.   Medications:     Scheduled Medications: . collagenase   Topical Daily  . digoxin  0.125 mg Oral Daily  . feeding supplement (ENSURE ENLIVE)  237 mL Oral TID BM  . feeding supplement (PRO-STAT SUGAR FREE 64)  30 mL Oral BID  . Gerhardt's butt cream   Topical Daily  . heparin injection (subcutaneous)  5,000 Units Subcutaneous Q8H  . isosorbide-hydrALAZINE  0.5 tablet Oral TID  . multivitamin with minerals  1 tablet Oral Daily  .  pantoprazole  40 mg Oral Daily  . sodium chloride flush  10-40 mL Intracatheter Q12H  . spironolactone  12.5 mg Oral Daily  . thiamine  100 mg Per Tube Daily    Infusions: . sodium chloride 250 mL (08/28/18 0014)    PRN Medications: acetaminophen (TYLENOL) oral liquid 160 mg/5 mL, acetaminophen, lip balm, ondansetron (ZOFRAN) IV, polyethylene glycol, senna, sodium chloride flush, sodium chloride flush   Assessment/Plan   1. 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  He was markedly volume overloaded at admission with  suspected mixed septic/cardiogenic shock.  Echo 4/3 with severe biventricular failure, LVEF 20-25% small effusion, moderate to severe MR and severe TR.  He is now off dobutamine and well-diuresed.  BUN mildly higher.  Volume looks ok.  - Hold torsemide today, may only need every third day or twice a week.  - Continue Bidil 1/2 tab tid.   - Continue digoxin 0.125 daily.  - Continue low dose spironolactone 12.5 daily   - Losartan stopped with soft BP and rising BUN.    - He is not going to be a candidate for advanced therapies.  LVAD likely not an option regardless with severe RV dysfunction and has had very poor compliance with medical therapy.  2. Chest pain: Doubt ischemic. Trop negative. ECG with non-specific repol abnormalities. Cath 2016 no CAD. No further CP. 3. ID: Suspect component of septic shock, PCT 6.76.  Citrobacter freundii in blood initially. ?Source from lower leg/foot skin breakdown or stool.  Afebrile now with normal WBCs. Repeat cultures NGTD. Now off antibiotics.  4. AKI: Concern for ATN in setting of shock.  Was down at home for an undetermined length of time.  Initially required CVVH, now off. Resolved with creatinine now back to baseline. Mild rise in BUN, holding torsemide for now as above.  5. Neuro: Confusion/delirium initially, likely in setting of critical illness/infection. Head CT 4/4 with no acute changes. Passed swallow study.  Aphonia resolved.  6. Anemia: Received 1 unit PRBCs on 08/30/18. Hgb 8.5 today, no overt bleeding.  7. Vascular: ABIs normal.  Suspect skin breakdown on foot related to venous stasis and marked swelling initially.    - Daily dressing changes.  8. FEN: Severe protein calorie malnutrition. Received short term tube feeds with Cor-trak which was removed 4/14.  9. Hyperkalemia: Resolved, tolerating low dose spironolactone.  10. LE wounds and sacral decub: WOC has seen for d/c recommendations. No change.  11.  Deconditioning: Severe.  Out of bed,  PT/OT.  Will need SNF - see below. No change.  12. Disposition: Prognosis quite poor. He is ready for SNF placement when bed available. This is only remaining issue.  - Still waiting for placement. Hopefully we can make this a priority as he has been ready for a while now.    Length of Stay: [place], [place]  8:10 AM  Advanced Heart Failure Team Pager [phone] (M-F; 7a - 4p)  Please contact [facility] for night-coverage after hours (4p -7a ) and weekends on [email or web address]

## 2018-09-14 NOTE — Progress Notes (Signed)
Physical Therapy Treatment Patient Details Name: Kyle Conrad MRN: [id number] DOB: 21-Oct-1965 Today's Date: 09/14/2018    History of Present Illness Pt is a 53 y.o. male admitted 08/16/18 with AMS after being found down by neighbor; treated for cardiogenic shock, septic shock with citrobacter bactermeia. Also with AKI; on CRRT 4/3-4/8. Has remained encephalopathic throughout admission. Head CT 4/4 with no acute changes. PMH includes biventricular HF, HTN.    PT Comments    Still tries to control his schedule.  But will participate without too much trouble.  Emphasis on gait stability/stamina and increasing gait speed.   Follow Up Recommendations  SNF;Supervision/Assistance - 24 hour     Equipment Recommendations  Other (comment)(TBA)    Recommendations for Other Services       Precautions / Restrictions Precautions Precautions: Fall Restrictions Weight Bearing Restrictions: No    Mobility  Bed Mobility                  Transfers Overall transfer level: Needs assistance Equipment used: Rolling walker (2 wheeled) Transfers: Sit to/from [employer] Transfers Sit to Stand: Supervision Stand pivot transfers: Supervision          Ambulation/Gait Ambulation/Gait assistance: Supervision Gait Distance (Feet): 600 Feet Assistive device: None Gait Pattern/deviations: Step-through pattern Gait velocity: slower to moderate.   General Gait Details: Initially, very stiff and mildly unsteady, needing guarding, but with distance becomes more stable and able to work on narrowing his stance and speeding up in increments.   Stairs             Wheelchair Mobility    Modified Rankin (Stroke Patients Only)       Balance Overall balance assessment: Needs assistance   Sitting balance-[name]: Good       Standing balance-[name]: Poor(to fair once warmed up.)                              Cognition Arousal/Alertness:  Awake/alert Behavior During Therapy: WFL for tasks assessed/performed Overall Cognitive Status: Within Functional Limits for tasks assessed                                        Exercises      General Comments General comments (skin integrity, edema, etc.): vss      Pertinent Vitals/Pain Pain Assessment: Faces Faces Pain Scale: Hurts little more Pain Location: all over Pain Descriptors / Indicators: Grimacing;Guarding(stiff) Pain Intervention(s): Monitored during session    Home Living                      Prior Function            PT Goals (current goals can now be found in the care plan section) Acute Rehab PT Goals Patient Stated Goal: Moving around easier. PT Goal Formulation: With patient Time For Goal Achievement: 09/19/18 Potential to Achieve Goals: Fair Progress towards PT goals: Progressing toward goals    Frequency    Min 2X/week      PT Plan Current plan remains appropriate    Co-evaluation              AM-PAC PT "6 Clicks" Mobility   Outcome Measure  Help needed turning from your back to your side while in a flat bed without using bedrails?: None Help needed moving from lying  on your back to sitting on the side of a flat bed without using bedrails?: None Help needed moving to and from a bed to a chair (including a wheelchair)?: None Help needed standing up from a chair using your arms (e.g., wheelchair or bedside chair)?: None Help needed to walk in hospital room?: A Little Help needed climbing 3-5 steps with a railing? : A Lot 6 Click Score: 21    End of Session   Activity Tolerance: Patient tolerated treatment well Patient left: in bed;with call bell/phone within reach;with bed alarm set Nurse Communication: Mobility status PT Visit Diagnosis: Other abnormalities of gait and mobility (R26.89);Pain Pain - Right/Left: Left Pain - part of body: Leg     Time: 0051-1021 PT Time Calculation (min) (ACUTE  ONLY): 24 min  Charges:  $Gait Training: 8-22 mins $Therapeutic Activity: 8-22 mins                     09/14/2018  Kyle Conrad, Kyle Conrad [phone]  (pager) [phone]  (office)   Kyle Conrad 09/14/2018, 4:38 PM

## 2018-09-14 NOTE — TOC Progression Note (Signed)
Transition of Care [facility]) - Progression Note    Patient Details  Name: Dondre Catalfamo MRN: [id number] Date of Birth: 1965/08/22  Transition of Care [facility]) CM/SW [place], [place] Phone Number: [phone] 09/14/2018, 2:58 PM  Clinical Narrative:    [facility] has no bed offers-    CSW Nutritional therapist for confirmation of Medicaid application submission and pending application number- CSW advised in email SNF needs information before making decision and before a LOG can be offered to SNF.  CSW  waiting on requested information once it is available. CSW has updated CSW Engineer, site. CSW will continue to follow and diligently seek placement.   Thurmond Butts, MSW, [facility] Clinical Social Worker [phone]    Expected Discharge Plan: Skilled Nursing Facility Barriers to Discharge: SNF Pending payor source - LOG, SNF Pending bed offer  Expected Discharge Plan and Services Expected Discharge Plan: [place]                                               Social Determinants of Health (SDOH) Interventions    Readmission Risk Interventions Readmission Risk Prevention Plan 08/28/2018  Transportation Screening Complete  PCP or Specialist Appt within 3-5 Days Complete  HRI or [place] Complete  Social Work Consult for [place] Planning/Counseling Complete  Palliative Care Screening Not Applicable  Medication Review [employer] photographer) Complete  Some recent data might be hidden

## 2018-09-15 LAB — CBC WITH DIFFERENTIAL/PLATELET
Abs Immature Granulocytes: 0.04 10*3/uL (ref 0.00–0.07)
Basophils Absolute: 0 10*3/uL (ref 0.0–0.1)
Basophils Relative: 1 %
Eosinophils Absolute: 0.3 10*3/uL (ref 0.0–0.5)
Eosinophils Relative: 3 %
HCT: 27 % — ABNORMAL LOW (ref 39.0–52.0)
Hemoglobin: 8.4 g/dL — ABNORMAL LOW (ref 13.0–17.0)
Immature Granulocytes: 1 %
Lymphocytes Relative: 18 %
Lymphs Abs: 1.6 10*3/uL (ref 0.7–4.0)
MCH: 29.4 pg (ref 26.0–34.0)
MCHC: 31.1 g/dL (ref 30.0–36.0)
MCV: 94.4 fL (ref 80.0–100.0)
Monocytes Absolute: 1.3 10*3/uL — ABNORMAL HIGH (ref 0.1–1.0)
Monocytes Relative: 14 %
Neutro Abs: 5.5 10*3/uL (ref 1.7–7.7)
Neutrophils Relative %: 63 %
Platelets: 648 10*3/uL — ABNORMAL HIGH (ref 150–400)
RBC: 2.86 MIL/uL — ABNORMAL LOW (ref 4.22–5.81)
RDW: 18.1 % — ABNORMAL HIGH (ref 11.5–15.5)
WBC: 8.7 10*3/uL (ref 4.0–10.5)
nRBC: 0 % (ref 0.0–0.2)

## 2018-09-15 LAB — BASIC METABOLIC PANEL
Anion gap: 9 (ref 5–15)
BUN: 37 mg/dL — ABNORMAL HIGH (ref 6–20)
CO2: 28 mmol/L (ref 22–32)
Calcium: 9.4 mg/dL (ref 8.9–10.3)
Chloride: 99 mmol/L (ref 98–111)
Creatinine, Ser: 1.03 mg/dL (ref 0.61–1.24)
GFR calc Af Amer: >60 mL/min (ref >60)
GFR calc non Af Amer: >60 mL/min (ref >60)
Glucose, Bld: 89 mg/dL (ref 70–99)
Potassium: 4.8 mmol/L (ref 3.5–5.1)
Sodium: 136 mmol/L (ref 135–145)

## 2018-09-15 NOTE — Progress Notes (Signed)
Patient ID: Kyle Conrad, male   DOB: 1966/05/07, 53 y.o.   MRN: [id number]     Advanced Heart Failure Rounding Note  PCP-Cardiologist: No primary care provider on file.   Subjective:    No CP or SOB. Weight stable.   Pending SNF bed when available.  Objective:   Weight Range: 63.4 kg Body mass index is 18.44 kg/m.   Vital Signs:   Temp:  [98.5 F (36.9 C)-98.8 F (37.1 C)] 98.5 F (36.9 C) (05/02 0745) Pulse Rate:  [82-106] 90 (05/02 0745) Resp:  [14-20] 15 (05/02 0745) BP: (89-119)/(63-82) 118/81 (05/02 0745) SpO2:  [100 %] 100 % (05/02 0745) Weight:  [63.4 kg] 63.4 kg (05/02 0500) Last BM Date: 09/12/18  Weight change: Filed Weights   09/12/18 0300 09/13/18 0452 09/15/18 0500  Weight: 63.9 kg 63.8 kg 63.4 kg    Intake/Output:   Intake/Output Summary (Last 24 hours) at 09/15/2018 0932 Last data filed at 09/15/2018 0748 Gross per 24 hour  Intake 1020 ml  Output 2400 ml  Net -1380 ml      Physical Exam   General:  Cachetic male. No resp difficulty HEENT: normal Neck: supple. no JVD. Carotids 2+ bilat; no bruits. No lymphadenopathy or thryomegaly appreciated. Cor: PMI nondisplaced. Regular rate & rhythm.2/6 MR  Lungs: clear Abdomen: soft, nontender, nondistended. No hepatosplenomegaly. No bruits or masses. Good bowel sounds. Extremities: no cyanosis, clubbing, rash, edema skin with multiple wounds/dressings  Neuro: alert & orientedx3, cranial nerves grossly intact. moves all 4 extremities w/o difficulty. Affect pleasant   Telemetry   NSR 80-90s. Personally reviewed   Labs    CBC Recent Labs    09/14/18 0500 09/15/18 0546  WBC 9.8 8.7  NEUTROABS 6.6 5.5  HGB 8.5* 8.4*  HCT 27.3* 27.0*  MCV 93.8 94.4  PLT 657* 627*   Basic Metabolic Panel Recent Labs    09/14/18 0500 09/15/18 0546  NA 135 136  K 4.7 4.8  CL 96* 99  CO2 32 28  GLUCOSE 94 89  BUN 45* 37*  CREATININE 1.16 1.03  CALCIUM 9.2 9.4   Liver Function Tests No results for  input(s): AST, ALT, ALKPHOS, BILITOT, PROT, ALBUMIN in the last 72 hours. No results for input(s): LIPASE, AMYLASE in the last 72 hours. Cardiac Enzymes No results for input(s): CKTOTAL, CKMB, CKMBINDEX, TROPONINI in the last 72 hours.  BNP: BNP (last 3 results) Recent Labs    03/24/18 1435 06/05/18 1411 08/16/18 1611  BNP 2,471.9* 3,961.4* 2,433.3*    ProBNP (last 3 results) No results for input(s): PROBNP in the last 8760 hours.   D-Dimer No results for input(s): DDIMER in the last 72 hours. Hemoglobin A1C No results for input(s): HGBA1C in the last 72 hours. Fasting Lipid Panel No results for input(s): CHOL, HDL, LDLCALC, TRIG, CHOLHDL, LDLDIRECT in the last 72 hours. Thyroid Function Tests No results for input(s): TSH, T4TOTAL, T3FREE, THYROIDAB in the last 72 hours.  Invalid input(s): FREET3  Other results:   Imaging    No results found.   Medications:     Scheduled Medications: . collagenase   Topical Daily  . digoxin  0.125 mg Oral Daily  . feeding supplement (ENSURE ENLIVE)  237 mL Oral TID BM  . feeding supplement (PRO-STAT SUGAR FREE 64)  30 mL Oral BID  . Gerhardt's butt cream   Topical Daily  . heparin injection (subcutaneous)  5,000 Units Subcutaneous Q8H  . isosorbide-hydrALAZINE  0.5 tablet Oral TID  . multivitamin with minerals  1 tablet Oral Daily  . pantoprazole  40 mg Oral Daily  . sodium chloride flush  10-40 mL Intracatheter Q12H  . spironolactone  12.5 mg Oral Daily  . thiamine  100 mg Per Tube Daily    Infusions: . sodium chloride 250 mL (08/28/18 0014)    PRN Medications: acetaminophen (TYLENOL) oral liquid 160 mg/5 mL, acetaminophen, lip balm, ondansetron (ZOFRAN) IV, polyethylene glycol, senna, sodium chloride flush, sodium chloride flush   Assessment/Plan   1. 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  He was markedly volume overloaded at admission with suspected mixed septic/cardiogenic shock.  Echo 4/3 with severe biventricular failure, LVEF 20-25% small effusion, moderate to severe MR and severe TR.  He is now off dobutamine and well-diuresed.  BUN down  Volume looks ok.  - Continue  torsemide today, may only need every third day or twice a week.  - Continue Bidil 1/2 tab tid.   - Continue digoxin 0.125 daily.  - Continue low dose spironolactone 12.5 daily   - Losartan stopped with soft BP and rising BUN.    - He is not going to be a candidate for advanced therapies.  LVAD likely not an option regardless with severe RV dysfunction and has had very poor compliance with medical therapy.  2. Chest pain: Doubt ischemic. Trop negative. ECG with non-specific repol abnormalities. Cath 2016 no CAD. No further CP. 3. ID: Suspect component of septic shock, PCT 6.76.  Citrobacter freundii in blood initially. ?Source from lower leg/foot skin breakdown or stool.  Afebrile now with normal WBCs. Repeat cultures NGTD. Now off antibiotics.  4. AKI: Concern for ATN in setting of shock.  Was down at home for an undetermined length of time.  Initially required CVVH, now off. Resolved with creatinine now back to baseline. Mild rise in BUN yesterday, holding torsemide for now as above.  5. Neuro: Confusion/delirium initially, likely in setting of critical illness/infection. Head CT 4/4 with no acute changes. Passed swallow study.  Aphonia resolved.  6. Anemia: Received 1 unit PRBCs on 08/30/18. Hgb 8.4 (stable) today, no overt bleeding.  7. Vascular: ABIs normal.  Suspect skin breakdown on foot related to venous stasis and marked swelling initially.    - Daily dressing changes.  8. FEN: Severe protein calorie malnutrition. Received short term tube feeds with Cor-trak which was removed 4/14.  9. Hyperkalemia: Resolved, tolerating low dose spironolactone.  10. LE wounds and  sacral decub: WOC has seen for d/c recommendations. No change.  11.  Deconditioning: Severe.  Out of bed, PT/OT.  Will need SNF - see below. No change.  12. Disposition: Prognosis quite poor. He is ready for SNF placement when bed available. This is only remaining issue.  - Still waiting for placement. Hopefully we can make this a priority as he has been ready for a while now.    Length of Stay: 30  Glori Bickers, MD  9:32 AM  Advanced Heart Failure Team Pager [phone] (M-F; 7a - 4p)  Please contact [facility] for night-coverage after hours (4p -7a ) and weekends on [email or web address]

## 2018-09-16 LAB — CBC WITH DIFFERENTIAL/PLATELET
Abs Immature Granulocytes: 0.04 10*3/uL (ref 0.00–0.07)
Basophils Absolute: 0.1 10*3/uL (ref 0.0–0.1)
Basophils Relative: 1 %
Eosinophils Absolute: 0.3 10*3/uL (ref 0.0–0.5)
Eosinophils Relative: 4 %
HCT: 26.1 % — ABNORMAL LOW (ref 39.0–52.0)
Hemoglobin: 8.2 g/dL — ABNORMAL LOW (ref 13.0–17.0)
Immature Granulocytes: 0 %
Lymphocytes Relative: 19 %
Lymphs Abs: 1.8 10*3/uL (ref 0.7–4.0)
MCH: 29.6 pg (ref 26.0–34.0)
MCHC: 31.4 g/dL (ref 30.0–36.0)
MCV: 94.2 fL (ref 80.0–100.0)
Monocytes Absolute: 1.3 10*3/uL — ABNORMAL HIGH (ref 0.1–1.0)
Monocytes Relative: 14 %
Neutro Abs: 6 10*3/uL (ref 1.7–7.7)
Neutrophils Relative %: 62 %
Platelets: 590 10*3/uL — ABNORMAL HIGH (ref 150–400)
RBC: 2.77 MIL/uL — ABNORMAL LOW (ref 4.22–5.81)
RDW: 18.2 % — ABNORMAL HIGH (ref 11.5–15.5)
WBC: 9.5 10*3/uL (ref 4.0–10.5)
nRBC: 0 % (ref 0.0–0.2)

## 2018-09-16 LAB — BASIC METABOLIC PANEL
Anion gap: 7 (ref 5–15)
BUN: 33 mg/dL — ABNORMAL HIGH (ref 6–20)
CO2: 28 mmol/L (ref 22–32)
Calcium: 9.2 mg/dL (ref 8.9–10.3)
Chloride: 99 mmol/L (ref 98–111)
Creatinine, Ser: 0.99 mg/dL (ref 0.61–1.24)
GFR calc Af Amer: >60 mL/min (ref >60)
GFR calc non Af Amer: >60 mL/min (ref >60)
Glucose, Bld: 96 mg/dL (ref 70–99)
Potassium: 4.6 mmol/L (ref 3.5–5.1)
Sodium: 134 mmol/L — ABNORMAL LOW (ref 135–145)

## 2018-09-16 MED ORDER — DIPHENHYDRAMINE HCL 25 MG PO CAPS
25.0000 mg | ORAL_CAPSULE | Freq: Four times a day (QID) | ORAL | Status: DC | PRN
  Administered 2018-09-17: 25 mg via ORAL
  Filled 2018-09-16: qty 1

## 2018-09-16 NOTE — Progress Notes (Signed)
Patient c/o severe itching in left arm. Itching exacerbated following midline dressing change.  Contacted [facility] about issue and requested PRN benadryl.

## 2018-09-16 NOTE — Plan of Care (Signed)
  Problem: Education: Goal: Knowledge of General Education information will improve Description Including pain rating scale, medication(s)/side effects and non-pharmacologic comfort measures Outcome: Progressing Note:  POC reviewed with pt.   

## 2018-09-16 NOTE — Progress Notes (Signed)
Patient ID: Kyle Conrad, male   DOB: 24-Aug-1965, 53 y.o.   MRN: [id number]     Advanced Heart Failure Rounding Note  PCP-Cardiologist: No primary care provider on file.   Subjective:    Remains weak. Denies CP or SOB. Off torsemide Weight down 1 pound. Poor po intake   Pending SNF bed when available.  Objective:   Weight Range: 63 kg Body mass index is 18.32 kg/m.   Vital Signs:   Temp:  [98 F (36.7 C)-98.8 F (37.1 C)] 98 F (36.7 C) (05/03 1045) Pulse Rate:  [85-96] 96 (05/03 1045) Resp:  [15-19] 19 (05/03 1045) BP: (89-128)/(57-86) 89/72 (05/03 1045) SpO2:  [96 %-99 %] 98 % (05/03 1045) Weight:  [63 kg] 63 kg (05/03 0300) Last BM Date: 09/12/18  Weight change: Filed Weights   09/13/18 0452 09/15/18 0500 09/16/18 0300  Weight: 63.8 kg 63.4 kg 63 kg    Intake/Output:   Intake/Output Summary (Last 24 hours) at 09/16/2018 1259 Last data filed at 09/16/2018 1049 Gross per 24 hour  Intake 720 ml  Output 1911 ml  Net -1191 ml      Physical Exam   General:  Cachetic male. Chronically ill appearing . No resp difficulty HEENT: normal Neck: supple. no JVD. Carotids 2+ bilat; no bruits. No lymphadenopathy or thryomegaly appreciated. Cor: PMI laterally displaced. Regular rate & rhythm. No rubs, gallops or murmurs. Lungs: clear Abdomen: soft, nontender, nondistended. No hepatosplenomegaly. No bruits or masses. Good bowel sounds. Extremities: no cyanosis, clubbing, rash, edema multiple wounds with dressings  Neuro: alert & orientedx3, cranial nerves grossly intact. moves all 4 extremities w/o difficulty. Affect pleasant   Telemetry   NSR 80-90s. Personally reviewed   Labs    CBC Recent Labs    09/15/18 0546 09/16/18 0255  WBC 8.7 9.5  NEUTROABS 5.5 6.0  HGB 8.4* 8.2*  HCT 27.0* 26.1*  MCV 94.4 94.2  PLT 648* 277*   Basic Metabolic Panel Recent Labs    09/15/18 0546 09/16/18 0255  NA 136 134*  K 4.8 4.6  CL 99 99  CO2 28 28  GLUCOSE 89 96  BUN  37* 33*  CREATININE 1.03 0.99  CALCIUM 9.4 9.2   Liver Function Tests No results for input(s): AST, ALT, ALKPHOS, BILITOT, PROT, ALBUMIN in the last 72 hours. No results for input(s): LIPASE, AMYLASE in the last 72 hours. Cardiac Enzymes No results for input(s): CKTOTAL, CKMB, CKMBINDEX, TROPONINI in the last 72 hours.  BNP: BNP (last 3 results) Recent Labs    03/24/18 1435 06/05/18 1411 08/16/18 1611  BNP 2,471.9* 3,961.4* 2,433.3*    ProBNP (last 3 results) No results for input(s): PROBNP in the last 8760 hours.   D-Dimer No results for input(s): DDIMER in the last 72 hours. Hemoglobin A1C No results for input(s): HGBA1C in the last 72 hours. Fasting Lipid Panel No results for input(s): CHOL, HDL, LDLCALC, TRIG, CHOLHDL, LDLDIRECT in the last 72 hours. Thyroid Function Tests No results for input(s): TSH, T4TOTAL, T3FREE, THYROIDAB in the last 72 hours.  Invalid input(s): FREET3  Other results:   Imaging    No results found.   Medications:     Scheduled Medications: . collagenase   Topical Daily  . digoxin  0.125 mg Oral Daily  . feeding supplement (ENSURE ENLIVE)  237 mL Oral TID BM  . feeding supplement (PRO-STAT SUGAR FREE 64)  30 mL Oral BID  . Gerhardt's butt cream   Topical Daily  . heparin injection (subcutaneous)  5,000 Units Subcutaneous Q8H  . isosorbide-hydrALAZINE  0.5 tablet Oral TID  . multivitamin with minerals  1 tablet Oral Daily  . pantoprazole  40 mg Oral Daily  . sodium chloride flush  10-40 mL Intracatheter Q12H  . spironolactone  12.5 mg Oral Daily  . thiamine  100 mg Per Tube Daily    Infusions: . sodium chloride 250 mL (08/28/18 0014)    PRN Medications: acetaminophen (TYLENOL) oral liquid 160 mg/5 mL, acetaminophen, lip balm, ondansetron (ZOFRAN) IV, polyethylene glycol, senna, sodium chloride flush, sodium chloride flush   Assessment/Plan   1. 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  He was markedly volume overloaded at admission with suspected mixed septic/cardiogenic shock.  Echo 4/3 with severe biventricular failure, LVEF 20-25% small effusion, moderate to severe MR and severe TR.  He is now off dobutamine and well-diuresed.  BUN down  Volume looks ok.  - Continue to hold  torsemide today, may only need every third day or twice a week. Dose as needed - Continue Bidil 1/2 tab tid.  (may need to hold with low BP) - Continue digoxin 0.125 daily.  - Continue low dose spironolactone 12.5 daily   - Losartan stopped with soft BP and rising BUN.    - He is not going to be a candidate for advanced therapies.  LVAD likely not an option regardless with severe RV dysfunction and has had very poor compliance with medical therapy.  2. Chest pain: Doubt ischemic. Trop negative. ECG with non-specific repol abnormalities. Cath 2016 no CAD. No further CP. 3. ID: Suspect component of septic shock, PCT 6.76.  Citrobacter freundii in blood initially. ?Source from lower leg/foot skin breakdown or stool.  Afebrile now with normal WBCs. Repeat cultures NGTD. Now off antibiotics.  4. AKI: Concern for ATN in setting of shock.  Was down at home for an undetermined length of time.  Initially required CVVH, now off. Resolved with creatinine now back to baseline. Mild rise in BUN yesterday, holding torsemide for now as above.  5. Neuro: Confusion/delirium initially, likely in setting of critical illness/infection. Head CT 4/4 with no acute changes. Passed swallow study.  Aphonia resolved.  6. Anemia: Received 1 unit PRBCs on 08/30/18. Hgb 8.2 (stable) today, no overt bleeding.  7. Vascular: ABIs normal.  Suspect skin breakdown on foot related to venous stasis and marked swelling initially.    - Daily dressing changes.  8. FEN: Severe protein calorie malnutrition. Received short term tube  feeds with Cor-trak which was removed 4/14.  9. Hyperkalemia: Resolved, tolerating low dose spironolactone.  10. LE wounds and sacral decub: WOC has seen for d/c recommendations. No change.  11.  Deconditioning: Severe.  Out of bed, PT/OT.  Will need SNF - see below. No change.  12. Disposition: Prognosis quite poor. He is ready for SNF placement when bed available. This is only remaining issue.  - Still waiting for placement. Hopefully we can make this a priority as he has been ready for a while now.    Length of Stay: 34  Glori Bickers, MD  12:59 PM  Advanced Heart Failure Team Pager [phone] (M-F; 7a - 4p)  Please contact [facility] for night-coverage after hours (4p -7a ) and weekends on [email or web address]

## 2018-09-17 LAB — BASIC METABOLIC PANEL
Anion gap: 7 (ref 5–15)
BUN: 34 mg/dL — ABNORMAL HIGH (ref 6–20)
CO2: 28 mmol/L (ref 22–32)
Calcium: 9.1 mg/dL (ref 8.9–10.3)
Chloride: 100 mmol/L (ref 98–111)
Creatinine, Ser: 1 mg/dL (ref 0.61–1.24)
GFR calc Af Amer: >60 mL/min (ref >60)
GFR calc non Af Amer: >60 mL/min (ref >60)
Glucose, Bld: 93 mg/dL (ref 70–99)
Potassium: 4.5 mmol/L (ref 3.5–5.1)
Sodium: 135 mmol/L (ref 135–145)

## 2018-09-17 LAB — CBC WITH DIFFERENTIAL/PLATELET
Abs Immature Granulocytes: 0.02 10*3/uL (ref 0.00–0.07)
Basophils Absolute: 0 10*3/uL (ref 0.0–0.1)
Basophils Relative: 0 %
Eosinophils Absolute: 0.3 10*3/uL (ref 0.0–0.5)
Eosinophils Relative: 4 %
HCT: 26.6 % — ABNORMAL LOW (ref 39.0–52.0)
Hemoglobin: 8.4 g/dL — ABNORMAL LOW (ref 13.0–17.0)
Immature Granulocytes: 0 %
Lymphocytes Relative: 18 %
Lymphs Abs: 1.4 10*3/uL (ref 0.7–4.0)
MCH: 29.8 pg (ref 26.0–34.0)
MCHC: 31.6 g/dL (ref 30.0–36.0)
MCV: 94.3 fL (ref 80.0–100.0)
Monocytes Absolute: 1 10*3/uL (ref 0.1–1.0)
Monocytes Relative: 14 %
Neutro Abs: 4.8 10*3/uL (ref 1.7–7.7)
Neutrophils Relative %: 64 %
Platelets: 567 10*3/uL — ABNORMAL HIGH (ref 150–400)
RBC: 2.82 MIL/uL — ABNORMAL LOW (ref 4.22–5.81)
RDW: 18.3 % — ABNORMAL HIGH (ref 11.5–15.5)
WBC: 7.6 10*3/uL (ref 4.0–10.5)
nRBC: 0 % (ref 0.0–0.2)

## 2018-09-17 NOTE — Progress Notes (Signed)
Patient ID: Kyle Conrad, male   DOB: 11/17/1965, 52 y.o.   MRN: [id number]     Advanced Heart Failure Rounding Note  PCP-Cardiologist: No primary care provider on file.   Subjective:    Remains weak. Denies CP or SOB. Stable weight off torsemide.    Still pending SNF bed when available.  Objective:   Weight Range: 64.1 kg Body mass index is 18.64 kg/m.   Vital Signs:   Temp:  [98 F (36.7 C)-99.6 F (37.6 C)] 98.9 F (37.2 C) (05/04 0836) Pulse Rate:  [93-96] 93 (05/04 0300) Resp:  [17-22] 19 (05/04 0836) BP: (89-113)/(70-83) 111/74 (05/04 0836) SpO2:  [97 %-99 %] 98 % (05/04 0300) Weight:  [64.1 kg] 64.1 kg (05/04 0500) Last BM Date: 09/16/18  Weight change: Filed Weights   09/16/18 0300 09/16/18 2339 09/17/18 0500  Weight: 63 kg 64.1 kg 64.1 kg    Intake/Output:   Intake/Output Summary (Last 24 hours) at 09/17/2018 0930 Last data filed at 09/17/2018 0000 Gross per 24 hour  Intake 827 ml  Output 1310 ml  Net -483 ml      Physical Exam   General: cachectic Neck: No JVD, no thyromegaly or thyroid nodule.  Lungs: Clear to auscultation bilaterally with normal respiratory effort. CV: Lateral PMI.  Heart regular S1/S2, no S3/S4, 2/6 HSM apex.  No peripheral edema.   Abdomen: Soft, nontender, no hepatosplenomegaly, no distention.  Skin: Intact without lesions or rashes.  Neurologic: Alert and oriented x 3.  Psych: Normal affect. Extremities: No clubbing or cyanosis.  HEENT: Normal.    Telemetry   NSR 80-90s. Personally reviewed   Labs    CBC Recent Labs    09/16/18 0255 09/17/18 0445  WBC 9.5 7.6  NEUTROABS 6.0 4.8  HGB 8.2* 8.4*  HCT 26.1* 26.6*  MCV 94.2 94.3  PLT 590* 259*   Basic Metabolic Panel Recent Labs    09/16/18 0255 09/17/18 0445  NA 134* 135  K 4.6 4.5  CL 99 100  CO2 28 28  GLUCOSE 96 93  BUN 33* 34*  CREATININE 0.99 1.00  CALCIUM 9.2 9.1   Liver Function Tests No results for input(s): AST, ALT, ALKPHOS, BILITOT,  PROT, ALBUMIN in the last 72 hours. No results for input(s): LIPASE, AMYLASE in the last 72 hours. Cardiac Enzymes No results for input(s): CKTOTAL, CKMB, CKMBINDEX, TROPONINI in the last 72 hours.  BNP: BNP (last 3 results) Recent Labs    03/24/18 1435 06/05/18 1411 08/16/18 1611  BNP 2,471.9* 3,961.4* 2,433.3*    ProBNP (last 3 results) No results for input(s): PROBNP in the last 8760 hours.   D-Dimer No results for input(s): DDIMER in the last 72 hours. Hemoglobin A1C No results for input(s): HGBA1C in the last 72 hours. Fasting Lipid Panel No results for input(s): CHOL, HDL, LDLCALC, TRIG, CHOLHDL, LDLDIRECT in the last 72 hours. Thyroid Function Tests No results for input(s): TSH, T4TOTAL, T3FREE, THYROIDAB in the last 72 hours.  Invalid input(s): FREET3  Other results:   Imaging    No results found.   Medications:     Scheduled Medications: . collagenase   Topical Daily  . digoxin  0.125 mg Oral Daily  . feeding supplement (ENSURE ENLIVE)  237 mL Oral TID BM  . feeding supplement (PRO-STAT SUGAR FREE 64)  30 mL Oral BID  . Gerhardt's butt cream   Topical Daily  . heparin injection (subcutaneous)  5,000 Units Subcutaneous Q8H  . isosorbide-hydrALAZINE  0.5 tablet Oral TID  .  multivitamin with minerals  1 tablet Oral Daily  . pantoprazole  40 mg Oral Daily  . sodium chloride flush  10-40 mL Intracatheter Q12H  . spironolactone  12.5 mg Oral Daily  . thiamine  100 mg Per Tube Daily    Infusions: . sodium chloride 250 mL (08/28/18 0014)    PRN Medications: acetaminophen (TYLENOL) oral liquid 160 mg/5 mL, acetaminophen, diphenhydrAMINE, lip balm, ondansetron (ZOFRAN) IV, polyethylene glycol, senna, sodium chloride flush, sodium chloride flush   Assessment/Plan   1. 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  He was markedly volume overloaded at admission with suspected mixed septic/cardiogenic shock.  Echo 4/3 with severe biventricular failure, LVEF 20-25% small effusion, moderate to severe MR and severe TR.  He is now off dobutamine and well-diuresed.  Volume looks ok. SBP in 100s.  - Continue to hold  torsemide today, may only need every third day or twice a week.  Will give Tuesdays and Fridays.  - Continue Bidil 1/2 tab tid.   - Continue digoxin 0.125 daily.  - Continue low dose spironolactone 12.5 daily   - Losartan stopped with soft BP and rising BUN.    - He is not going to be a candidate for advanced therapies.  LVAD likely not an option regardless with severe RV dysfunction and has had very poor compliance with medical therapy.  2. Chest pain: Doubt ischemic. Trop negative. ECG with non-specific repol abnormalities. Cath 2016 no CAD. No further CP. 3. ID: Suspect component of septic shock, PCT 6.76.  Citrobacter freundii in blood initially. ?Source from lower leg/foot skin breakdown or stool.  Afebrile now with normal WBCs. Repeat cultures NGTD. Now off antibiotics.  4. AKI: Concern for ATN in setting of shock.  Was down at home for an undetermined length of time.  Initially required CVVH, now off. Resolved with creatinine now back to baseline. BUN/creatinine stable.  5. Neuro: Confusion/delirium initially, likely in setting of critical illness/infection. Head CT 4/4 with no acute changes. Passed swallow study.  Aphonia resolved.  6. Anemia: Received 1 unit PRBCs on 08/30/18. Hgb 8.4 (stable) today, no overt bleeding.  7. Vascular: ABIs normal.  Suspect skin breakdown on foot related to venous stasis and marked swelling initially.    - Daily dressing changes.  8. FEN: Severe protein calorie malnutrition. Received short term tube feeds with Cor-trak which was removed 4/14.  9. Hyperkalemia: Resolved, tolerating low dose spironolactone.  10. LE wounds and sacral decub: WOC has seen for  d/c recommendations. No change.  11.  Deconditioning: Severe.  Out of bed, PT/OT.  Will need SNF - see below. No change.  12. Disposition: Prognosis quite poor. He is ready for SNF placement when bed available. This is only remaining issue.  - Still waiting for placement. Hopefully social work will make this a priority and push for resolution as he has been ready for a while now. Maybe he could go to CIR?   Length of Stay: [place], MD  9:30 AM  Advanced Heart Failure Team Pager [phone] (M-F; 7a - 4p)  Please contact [facility] for night-coverage after hours (4p -7a ) and weekends on [email or web address]

## 2018-09-17 NOTE — Progress Notes (Addendum)
CSW Advice worker for an update regarding Medicaid information requested by [employer], Pending response.   Update: Financial counselor reports there is no pending Medicaid number given to patients as per [employer] request. Medicaid application was filed on 5/4, date given to [place].  [place], [place]

## 2018-09-17 NOTE — Plan of Care (Signed)
  Problem: Education: Goal: Knowledge of General Education information will improve Description Including pain rating scale, medication(s)/side effects and non-pharmacologic comfort measures Outcome: Progressing   Problem: Health Behavior/Discharge Planning: Goal: Ability to manage health-related needs will improve Outcome: Progressing   Problem: Clinical Measurements: Goal: Ability to maintain clinical measurements within normal limits will improve Outcome: Progressing Goal: Will remain free from infection Outcome: Progressing Goal: Diagnostic test results will improve Outcome: Progressing Goal: Respiratory complications will improve Outcome: Progressing Goal: Cardiovascular complication will be avoided Outcome: Progressing   Problem: Activity: Goal: Risk for activity intolerance will decrease Outcome: Progressing   Problem: Nutrition: Goal: Adequate nutrition will be maintained Outcome: Progressing   Problem: Coping: Goal: Level of anxiety will decrease Outcome: Progressing   Problem: Pain Managment: Goal: General experience of comfort will improve Outcome: Progressing   Problem: Safety: Goal: Ability to remain free from injury will improve Outcome: Progressing   Problem: Skin Integrity: Goal: Risk for impaired skin integrity will decrease Outcome: Progressing   Problem: Fluid Volume: Goal: Hemodynamic stability will improve Outcome: Progressing   Problem: Clinical Measurements: Goal: Diagnostic test results will improve Outcome: Progressing Goal: Signs and symptoms of infection will decrease Outcome: Progressing   Problem: Education: Goal: Knowledge of disease and its progression will improve Outcome: Progressing   Problem: Urinary Elimination: Goal: Progression of disease will be identified and treated Outcome: Progressing

## 2018-09-17 NOTE — Progress Notes (Addendum)
Nutrition Follow-up  DOCUMENTATION CODES:   Severe malnutrition in context of chronic illness  INTERVENTION:   - Continue with Regular diet  - ContinueEnsure Enlive poTID, each supplement provides 350 kcal and 20 grams of protein  - Continue Pro-stat 30 ml po BID, each supplement provides 100 kcal and 15 grams of protein  -ContinueMagicCup TID with meals, each supplement provides 290 kcal and 9 grams of protein  -ContinueMVI with minerals daily  -Gravy with meats at meals  - Continue to encourage adequate PO intake  NUTRITION DIAGNOSIS:   Severe Malnutrition related to chronic illness (CHF) as evidenced by severe fat depletion, severe muscle depletion.  New diagnosis after completion of repeat NFPE  GOAL:   Patient will meet greater than or equal to 90% of their needs  Progressing  MONITOR:   PO intake, Supplement acceptance, Labs, Skin, I & O's, Weight trends  REASON FOR ASSESSMENT:   Consult Assessment of nutrition requirement/status  ASSESSMENT:   53 year old male who presented to the ED on 4/2 with AMS and weakness. PMH of CHF, HTN. Pt admitted with decompensated biventricular heart failure and shock.  4/03 - CRRT initiated 4/04 - SLP evaluation with recommendations for regular diet and thin liquids 4/06 - Cortrak placed and TF initiated 4/08 - CRRT off 4/13 - transitioned to bolus TF 4/14 - Cortrak removed  Pt continues to await SNF offer with no bed offers as of today.  Weight stable over the last week, ranging between 123-141 lbs.  Spoke with pt at bedside who reports that his appetite is improving. Pt confirms that he continues to drink Ensure Enlive oral nutrition supplements and take Pro-stat twice daily.  Pt states that he ate well at breakfast this morning and had blueberry muffin with butter and jelly, yogurt, and fruit. Noted cereal and milk on bedside tray. Pt reports that he is saving these for his "mid-morning snack." Pt with  ~90% complete Ensure Enlive at bedside. Pt reports that he is still "working on it."  Pt reports that he has been able to find foods that he likes and is ordering those foods.  Due to significant weight loss since admission likely related in part to fluid status, RD repeated NFPE. Pt now meets criteria for severe chronic malnutrition.  Meal Completion: - 5/1: 75%, 100% - 5/2: 100%, 70%, 100% - 5/3: 100%, 100%, 100%  Medications reviewed and include: Ensure Enlive TID, Pro-stat 30 ml BID, MVI with minerals daily, Protonix, spironolactone, thiamine  Labs reviewed: hemoglobin 8.4 (L)  UOP: 1660 ml x 24 hours I/O's: -45.9 L since admit  NUTRITION - FOCUSED PHYSICAL EXAM:  RD completed repeat NFPE.    Most Recent Value  Orbital Region  Moderate depletion  Upper Arm Region  Severe depletion  Thoracic and Lumbar Region  Severe depletion  Buccal Region  Moderate depletion  Temple Region  Severe depletion  Clavicle Bone Region  Severe depletion  Clavicle and Acromion Bone Region  Severe depletion  Scapular Bone Region  Severe depletion  Dorsal Hand  Moderate depletion  Patellar Region  Severe depletion  Anterior Thigh Region  Severe depletion  Posterior Calf Region  Severe depletion  Edema (RD Assessment)  None  Hair  Reviewed  Eyes  Reviewed  Mouth  Reviewed  Skin  Reviewed  Nails  Reviewed       Diet Order:   Diet Order            Diet regular Room service appropriate? Yes; Fluid consistency: Thin  Diet effective now              EDUCATION NEEDS:   Education needs have been addressed  Skin:  Skin Assessment: Skin Integrity Issues: DTI: left buttocks, left thigh Other: non-pressure wound to toe, wound to left calf  Last BM:  09/16/18 large type 4  Height:   Ht Readings from Last 1 Encounters:  08/23/18 6\' 1"  (1.854 m)    Weight:   Wt Readings from Last 1 Encounters:  09/17/18 64.1 kg    Ideal Body Weight:  80.9 kg  BMI:  Body mass index is 18.64  kg/m.  Estimated Nutritional Needs:   Kcal:  2000-2200 (31-34 kcal/kg actual body weight)  Protein:  110-125 grams  Fluid:  per MD    Gaynell Face, MS, RD, LDN Inpatient Clinical Dietitian Pager: [phone] Weekend/After Hours: [phone]

## 2018-09-17 NOTE — Plan of Care (Signed)
  Problem: Education: Goal: Knowledge of General Education information will improve Description Including pain rating scale, medication(s)/side effects and non-pharmacologic comfort measures Outcome: Progressing   Problem: Health Behavior/Discharge Planning: Goal: Ability to manage health-related needs will improve Outcome: Progressing   Problem: Clinical Measurements: Goal: Ability to maintain clinical measurements within normal limits will improve Outcome: Progressing Goal: Will remain free from infection Outcome: Progressing Goal: Diagnostic test results will improve Outcome: Progressing Goal: Respiratory complications will improve Outcome: Progressing Goal: Cardiovascular complication will be avoided Outcome: Progressing   Problem: Health Behavior/Discharge Planning: Goal: Ability to manage health-related needs will improve Outcome: Progressing   Problem: Clinical Measurements: Goal: Ability to maintain clinical measurements within normal limits will improve Outcome: Progressing Goal: Will remain free from infection Outcome: Progressing Goal: Diagnostic test results will improve Outcome: Progressing Goal: Respiratory complications will improve Outcome: Progressing Goal: Cardiovascular complication will be avoided Outcome: Progressing   Problem: Activity: Goal: Risk for activity intolerance will decrease Outcome: Progressing

## 2018-09-18 DIAGNOSIS — E440 Moderate protein-calorie malnutrition: Secondary | ICD-10-CM

## 2018-09-18 LAB — CBC WITH DIFFERENTIAL/PLATELET
Abs Immature Granulocytes: 0.03 10*3/uL (ref 0.00–0.07)
Basophils Absolute: 0 10*3/uL (ref 0.0–0.1)
Basophils Relative: 0 %
Eosinophils Absolute: 0.4 10*3/uL (ref 0.0–0.5)
Eosinophils Relative: 6 %
HCT: 28 % — ABNORMAL LOW (ref 39.0–52.0)
Hemoglobin: 8.6 g/dL — ABNORMAL LOW (ref 13.0–17.0)
Immature Granulocytes: 0 %
Lymphocytes Relative: 18 %
Lymphs Abs: 1.4 10*3/uL (ref 0.7–4.0)
MCH: 29.5 pg (ref 26.0–34.0)
MCHC: 30.7 g/dL (ref 30.0–36.0)
MCV: 95.9 fL (ref 80.0–100.0)
Monocytes Absolute: 1.1 10*3/uL — ABNORMAL HIGH (ref 0.1–1.0)
Monocytes Relative: 14 %
Neutro Abs: 4.6 10*3/uL (ref 1.7–7.7)
Neutrophils Relative %: 62 %
Platelets: 540 10*3/uL — ABNORMAL HIGH (ref 150–400)
RBC: 2.92 MIL/uL — ABNORMAL LOW (ref 4.22–5.81)
RDW: 18.5 % — ABNORMAL HIGH (ref 11.5–15.5)
WBC: 7.5 10*3/uL (ref 4.0–10.5)
nRBC: 0 % (ref 0.0–0.2)

## 2018-09-18 LAB — BASIC METABOLIC PANEL
Anion gap: 10 (ref 5–15)
BUN: 34 mg/dL — ABNORMAL HIGH (ref 6–20)
CO2: 26 mmol/L (ref 22–32)
Calcium: 9.3 mg/dL (ref 8.9–10.3)
Chloride: 100 mmol/L (ref 98–111)
Creatinine, Ser: 1.01 mg/dL (ref 0.61–1.24)
GFR calc Af Amer: >60 mL/min (ref >60)
GFR calc non Af Amer: >60 mL/min (ref >60)
Glucose, Bld: 82 mg/dL (ref 70–99)
Potassium: 4.7 mmol/L (ref 3.5–5.1)
Sodium: 136 mmol/L (ref 135–145)

## 2018-09-18 MED ORDER — DAKINS (1/4 STRENGTH) 0.125 % EX SOLN
Freq: Every day | CUTANEOUS | Status: DC
  Filled 2018-09-18: qty 473

## 2018-09-18 MED ORDER — TORSEMIDE 20 MG PO TABS
20.0000 mg | ORAL_TABLET | Freq: Once | ORAL | Status: AC
  Administered 2018-09-18: 20 mg via ORAL
  Filled 2018-09-18: qty 1

## 2018-09-18 NOTE — Consult Note (Signed)
[facility] Nurse wound follow up Well known to [place] [facility] team.  Heart failure and cardiogenic shock.  Has full thickness wounds on legs and unstageable pressure injury to sacrum.  New consult has been placed for decline in sacral wound and ongoing, unhealed wounds.  Due to severe malnutrition and poor prognosis,autolysis and improvement in wounds is unlikely.  Healing is not he goal or expected outcome for this patient.  Will amend topical orders to avoid infection and/or wound decline although both are possible given the patient's permanent state of health and poor prognosis. Off loading is still in place and nutrition services has consulted to optimize nutrition.  Last albumin was 1.4.   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  Wound UMP:NTIR pink nongranulating and black devitalized tissue.  Slough and purulence to sacral wound.  Drainage (amount, consistency, odor) minimal purulence to sacral wound.  Musty odor,  Periwound:peeling epithelium, indicative of decline in wound.  Dressing procedure/placement/frequency:For three days:  Cleanse sacral wound with NS and pat dry.  Apply Dakins moist gauze to wound bed.  Cover with ABD pads and tape.  Beginning Friday 09/21/18, Cleanse sacral wound with NS and pat dry.  Fill wound bed NS moist Aquacel Ag and cover with Abd pads and tape.  Change M/W/F.   Will not follow at this time.  Please re-consult if needed.  Domenic Moras MSN, RN, FNP-BC CWON Wound, Ostomy, Continence Nurse Pager [phone]

## 2018-09-18 NOTE — Progress Notes (Signed)
Wound care completed for left foot/toes and left buttocks and left posterior thigh.  Left foot/toes being treated w/Xenoform, changed daily.  New tissue granulation noted underneath dry, necrotic skin that is flaking off small pieces at a time.  Patient c/o no pain during this dressing change.  Left thigh and buttocks, in particular, are concerning for sloughing of borders around large necrotic areas.  Buttocks wound has a foul odor.  Both thigh and buttocks have yellow/green drainage on old dressings.  Caryl Pina, NP, for Dr. Aundra Dubin has been notified and plans to reconsult wound care team for further evaluation.

## 2018-09-18 NOTE — Plan of Care (Signed)
  Problem: Education: Goal: Knowledge of General Education information will improve Description Including pain rating scale, medication(s)/side effects and non-pharmacologic comfort measures Outcome: Progressing   Problem: Health Behavior/Discharge Planning: Goal: Ability to manage health-related needs will improve Outcome: Progressing   Problem: Clinical Measurements: Goal: Ability to maintain clinical measurements within normal limits will improve Outcome: Progressing Goal: Will remain free from infection Outcome: Progressing Goal: Diagnostic test results will improve Outcome: Progressing Goal: Respiratory complications will improve Outcome: Progressing Goal: Cardiovascular complication will be avoided Outcome: Progressing   Problem: Activity: Goal: Risk for activity intolerance will decrease Outcome: Progressing   Problem: Nutrition: Goal: Adequate nutrition will be maintained Outcome: Progressing   Problem: Coping: Goal: Level of anxiety will decrease Outcome: Progressing   Problem: Pain Managment: Goal: General experience of comfort will improve Outcome: Progressing   Problem: Fluid Volume: Goal: Hemodynamic stability will improve Outcome: Progressing   Problem: Clinical Measurements: Goal: Diagnostic test results will improve Outcome: Progressing Goal: Signs and symptoms of infection will decrease Outcome: Progressing

## 2018-09-18 NOTE — Progress Notes (Signed)
Patient ID: Kyle Conrad, male   DOB: 1966-05-14, 53 y.o.   MRN: [id number]     Advanced Heart Failure Rounding Note  PCP-Cardiologist: No primary care provider on file.   Subjective:    Remains weak. Denies CP or SOB. Weight stable.     Still pending SNF bed when available.  Objective:   Weight Range: 63.5 kg Body mass index is 18.47 kg/m.   Vital Signs:   Temp:  [98.4 F (36.9 C)-98.8 F (37.1 C)] 98.7 F (37.1 C) (05/05 0733) Pulse Rate:  [88-107] 100 (05/05 0733) Resp:  [14-20] 19 (05/05 0440) BP: (104-149)/(66-91) 110/78 (05/05 0733) SpO2:  [100 %] 100 % (05/05 0733) Weight:  [63.5 kg] 63.5 kg (05/05 0627) Last BM Date: 09/17/18  Weight change: Filed Weights   09/16/18 2339 09/17/18 0500 09/18/18 0627  Weight: 64.1 kg 64.1 kg 63.5 kg    Intake/Output:   Intake/Output Summary (Last 24 hours) at 09/18/2018 0847 Last data filed at 09/18/2018 0742 Gross per 24 hour  Intake 600 ml  Output 995 ml  Net -395 ml      Physical Exam   General: cachectic.  Neck: No JVD, no thyromegaly or thyroid nodule.  Lungs: Clear to auscultation bilaterally with normal respiratory effort. CV: Lateral PMI.  Heart regular S1/S2, no S3/S4, 2/6 HSM apex.  No peripheral edema.   Abdomen: Soft, nontender, no hepatosplenomegaly, no distention.  Skin: Intact without lesions or rashes.  Neurologic: Alert and oriented x 3.  Psych: Normal affect. Extremities: No clubbing or cyanosis.  HEENT: Normal.    Telemetry   NSR 80-90s. Personally reviewed   Labs    CBC Recent Labs    09/17/18 0445 09/18/18 0500  WBC 7.6 7.5  NEUTROABS 4.8 4.6  HGB 8.4* 8.6*  HCT 26.6* 28.0*  MCV 94.3 95.9  PLT 567* 941*   Basic Metabolic Panel Recent Labs    09/17/18 0445 09/18/18 0500  NA 135 136  K 4.5 4.7  CL 100 100  CO2 28 26  GLUCOSE 93 82  BUN 34* 34*  CREATININE 1.00 1.01  CALCIUM 9.1 9.3   Liver Function Tests No results for input(s): AST, ALT, ALKPHOS, BILITOT, PROT,  ALBUMIN in the last 72 hours. No results for input(s): LIPASE, AMYLASE in the last 72 hours. Cardiac Enzymes No results for input(s): CKTOTAL, CKMB, CKMBINDEX, TROPONINI in the last 72 hours.  BNP: BNP (last 3 results) Recent Labs    03/24/18 1435 06/05/18 1411 08/16/18 1611  BNP 2,471.9* 3,961.4* 2,433.3*    ProBNP (last 3 results) No results for input(s): PROBNP in the last 8760 hours.   D-Dimer No results for input(s): DDIMER in the last 72 hours. Hemoglobin A1C No results for input(s): HGBA1C in the last 72 hours. Fasting Lipid Panel No results for input(s): CHOL, HDL, LDLCALC, TRIG, CHOLHDL, LDLDIRECT in the last 72 hours. Thyroid Function Tests No results for input(s): TSH, T4TOTAL, T3FREE, THYROIDAB in the last 72 hours.  Invalid input(s): FREET3  Other results:   Imaging    No results found.   Medications:     Scheduled Medications: . collagenase   Topical Daily  . digoxin  0.125 mg Oral Daily  . feeding supplement (ENSURE ENLIVE)  237 mL Oral TID BM  . feeding supplement (PRO-STAT SUGAR FREE 64)  30 mL Oral BID  . Gerhardt's butt cream   Topical Daily  . heparin injection (subcutaneous)  5,000 Units Subcutaneous Q8H  . isosorbide-hydrALAZINE  0.5 tablet Oral TID  .  multivitamin with minerals  1 tablet Oral Daily  . pantoprazole  40 mg Oral Daily  . sodium chloride flush  10-40 mL Intracatheter Q12H  . spironolactone  12.5 mg Oral Daily  . thiamine  100 mg Per Tube Daily    Infusions: . sodium chloride 250 mL (08/28/18 0014)    PRN Medications: acetaminophen (TYLENOL) oral liquid 160 mg/5 mL, acetaminophen, diphenhydrAMINE, lip balm, ondansetron (ZOFRAN) IV, polyethylene glycol, senna, sodium chloride flush, sodium chloride flush   Assessment/Plan   1. 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  He was markedly volume overloaded at admission with suspected mixed septic/cardiogenic shock.  Echo 4/3 with severe biventricular failure, LVEF 20-25% small effusion, moderate to severe MR and severe TR.  He is now off dobutamine and well-diuresed.  Volume looks ok. SBP in 100s.  - I will give him torsemide 20 mg today (Tuesdays and Fridays).  - Continue Bidil 1/2 tab tid.   - Continue digoxin 0.125 daily.  - Continue low dose spironolactone 12.5 daily   - Losartan stopped with soft BP and rising BUN.    - He is not going to be a candidate for advanced therapies.  LVAD likely not an option regardless with severe RV dysfunction and has had very poor compliance with medical therapy.  2. Chest pain: Doubt ischemic. Trop negative. ECG with non-specific repol abnormalities. Cath 2016 no CAD. No further CP. 3. ID: Suspect component of septic shock, PCT 6.76.  Citrobacter freundii in blood initially. ?Source from lower leg/foot skin breakdown or stool.  Afebrile now with normal WBCs. Repeat cultures NGTD. Now off antibiotics.  4. AKI: Concern for ATN in setting of shock.  Was down at home for an undetermined length of time.  Initially required CVVH, now off. Resolved with creatinine now back to baseline. BUN/creatinine stable.  5. Neuro: Confusion/delirium initially, likely in setting of critical illness/infection. Head CT 4/4 with no acute changes. Passed swallow study.  Aphonia resolved.  6. Anemia: Received 1 unit PRBCs on 08/30/18. Hgb 8.6 (stable) today, no overt bleeding.  7. Vascular: ABIs normal.  Suspect skin breakdown on foot related to venous stasis and marked swelling initially.    - Daily dressing changes.  8. FEN: Severe protein calorie malnutrition. Received short term tube feeds with Cor-trak which was removed 4/14.  9. Hyperkalemia: Resolved, tolerating low dose spironolactone.  10. LE wounds and sacral decub: WOC has seen for d/c recommendations. No change.  11.  Deconditioning:  Severe.  Out of bed, PT/OT.  Will need SNF - see below. No change.  12. Disposition: Prognosis overall poor. He is ready for SNF placement when bed available. This is only remaining issue.  - Still waiting for placement. Hopefully social work will make this a priority and push for resolution as he has been ready for a while now. Maybe he could go to CIR?   Length of Stay: [place], MD  8:47 AM  Advanced Heart Failure Team Pager [phone] (M-F; 7a - 4p)  Please contact [facility] for night-coverage after hours (4p -7a ) and weekends on [email or web address]

## 2018-09-18 NOTE — Progress Notes (Signed)
Physical Therapy Treatment Patient Details Name: Kyle Conrad MRN: [id number] DOB: June 22, 1965 Today's Date: 09/18/2018    History of Present Illness Pt is a 53 y.o. male admitted 08/16/18 with AMS after being found down by neighbor; treated for cardiogenic shock, septic shock with citrobacter bactermeia. Also with AKI; on CRRT 4/3-4/8. Has remained encephalopathic throughout admission. Head CT 4/4 with no acute changes. PMH includes biventricular HF, HTN.    PT Comments    Pt with L LE stiffness and soreness however improves with ambulation. Pt functioning at mod I however reports wounds on L LE are worsening. Acute PT to cont to follow to build up activity tolerance.   Follow Up Recommendations  SNF;Supervision/Assistance - 24 hour (pending home situation, may progress well enough to return home)     Equipment Recommendations       Recommendations for Other Services       Precautions / Restrictions Precautions Precautions: Fall Restrictions Weight Bearing Restrictions: No(some self WBAT on L LE due to stiffness)    Mobility  Bed Mobility Overal bed mobility: Needs Assistance Bed Mobility: Sit to Supine       Sit to supine: Supervision   General bed mobility comments: slow moving, guarded on L LE but able to complete without physical assist  Transfers Overall transfer level: Needs assistance Equipment used: Rolling walker (2 wheeled) Transfers: Sit to/from Stand Sit to Stand: Supervision         General transfer comment: verbal cues to bring LEs back towards chair, pt required increased time to power up but steady, pushed up from chair, transitioned hands well from chair to RW  Ambulation/Gait Ambulation/Gait assistance: Min guard Gait Distance (Feet): 500 Feet Assistive device: Rolling walker (2 wheeled);None Gait Pattern/deviations: Step-through pattern;Decreased stride length;Antalgic Gait velocity: slower to moderate. Gait velocity interpretation: 1.31 -  2.62 ft/sec, indicative of limited community ambulator General Gait Details: initially dependent on RW due to L LE stiffness but then transitioned to no AD without difficulty, mild L LE antalgia, decreased step length   Stairs             Wheelchair Mobility    Modified Rankin (Stroke Patients Only)       Balance Overall balance assessment: Needs assistance Sitting-balance support: No upper extremity supported;Feet supported Sitting balance-[name]: Good     Standing balance support: No upper extremity supported Standing balance-[name]: Fair Standing balance comment: had one episode of LOB in room requiring minA to prevent fall                            Cognition Arousal/Alertness: Awake/alert Behavior During Therapy: WFL for tasks assessed/performed Overall Cognitive Status: Within Functional Limits for tasks assessed                                 General Comments: pt likes to be in control of his schedule and plan      Exercises      General Comments General comments (skin integrity, edema, etc.): HR at 123 s/p amb      Pertinent Vitals/Pain Pain Assessment: 0-10 Pain Score: 3  Pain Location: L LE Pain Descriptors / Indicators: Guarding Pain Intervention(s): Monitored during session    Home Living                      Prior Function  PT Goals (current goals can now be found in the care plan section) Progress towards PT goals: Progressing toward goals    Frequency    Min 2X/week      PT Plan Current plan remains appropriate    Co-evaluation              AM-PAC PT "6 Clicks" Mobility   Outcome Measure  Help needed turning from your back to your side while in a flat bed without using bedrails?: None Help needed moving from lying on your back to sitting on the side of a flat bed without using bedrails?: None Help needed moving to and from a bed to a chair (including a  wheelchair)?: None Help needed standing up from a chair using your arms (e.g., wheelchair or bedside chair)?: None Help needed to walk in hospital room?: A Little Help needed climbing 3-5 steps with a railing? : A Lot 6 Click Score: 21    End of Session Equipment Utilized During Treatment: Gait belt Activity Tolerance: Patient tolerated treatment well Patient left: in bed;with call bell/phone within reach;with bed alarm set(with pillow under L hip for pressure relief) Nurse Communication: Mobility status PT Visit Diagnosis: Other abnormalities of gait and mobility (R26.89);Pain Pain - Right/Left: Left Pain - part of body: Leg     Time: 8338-2505 PT Time Calculation (min) (ACUTE ONLY): 22 min  Charges:  $Gait Training: 8-22 mins                     Kittie Plater, PT, DPT Acute Rehabilitation Services Pager #: [phone] Office #: [phone]    [place]line Lopes 09/18/2018, 1:18 PM

## 2018-09-19 DIAGNOSIS — E43 Unspecified severe protein-calorie malnutrition: Secondary | ICD-10-CM

## 2018-09-19 LAB — CBC WITH DIFFERENTIAL/PLATELET
Abs Immature Granulocytes: 0.02 10*3/uL (ref 0.00–0.07)
Basophils Absolute: 0 10*3/uL (ref 0.0–0.1)
Basophils Relative: 0 %
Eosinophils Absolute: 0.5 10*3/uL (ref 0.0–0.5)
Eosinophils Relative: 8 %
HCT: 27.6 % — ABNORMAL LOW (ref 39.0–52.0)
Hemoglobin: 8.6 g/dL — ABNORMAL LOW (ref 13.0–17.0)
Immature Granulocytes: 0 %
Lymphocytes Relative: 18 %
Lymphs Abs: 1.2 10*3/uL (ref 0.7–4.0)
MCH: 29.7 pg (ref 26.0–34.0)
MCHC: 31.2 g/dL (ref 30.0–36.0)
MCV: 95.2 fL (ref 80.0–100.0)
Monocytes Absolute: 1 10*3/uL (ref 0.1–1.0)
Monocytes Relative: 15 %
Neutro Abs: 4 10*3/uL (ref 1.7–7.7)
Neutrophils Relative %: 59 %
Platelets: 491 10*3/uL — ABNORMAL HIGH (ref 150–400)
RBC: 2.9 MIL/uL — ABNORMAL LOW (ref 4.22–5.81)
RDW: 18.5 % — ABNORMAL HIGH (ref 11.5–15.5)
WBC: 6.8 10*3/uL (ref 4.0–10.5)
nRBC: 0 % (ref 0.0–0.2)

## 2018-09-19 LAB — BASIC METABOLIC PANEL
Anion gap: 9 (ref 5–15)
BUN: 39 mg/dL — ABNORMAL HIGH (ref 6–20)
CO2: 26 mmol/L (ref 22–32)
Calcium: 9.3 mg/dL (ref 8.9–10.3)
Chloride: 99 mmol/L (ref 98–111)
Creatinine, Ser: 1.05 mg/dL (ref 0.61–1.24)
GFR calc Af Amer: >60 mL/min (ref >60)
GFR calc non Af Amer: >60 mL/min (ref >60)
Glucose, Bld: 88 mg/dL (ref 70–99)
Potassium: 4.7 mmol/L (ref 3.5–5.1)
Sodium: 134 mmol/L — ABNORMAL LOW (ref 135–145)

## 2018-09-19 NOTE — Progress Notes (Signed)
Occupational Therapy Treatment Patient Details Name: Kyle Conrad MRN: [id number] DOB: December 30, 1965 Today's Date: 09/19/2018    History of present illness Pt is a 53 y.o. male admitted 08/16/18 with AMS after being found down by neighbor; treated for cardiogenic shock, septic shock with citrobacter bactermeia. Also with AKI; on CRRT 4/3-4/8. Has remained encephalopathic throughout admission. Head CT 4/4 with no acute changes. PMH includes biventricular HF, HTN.   OT comments  Pt asking for assistance to stand and move about with walker to alleviate pain in R LE described as numbness and tightness.  Pt more irritable and short of patience today.   Follow Up Recommendations  SNF;Supervision/Assistance - 24 hour    Equipment Recommendations  None recommended by OT    Recommendations for Other Services      Precautions / Restrictions Precautions Precautions: Fall Restrictions Weight Bearing Restrictions: No       Mobility Bed Mobility               General bed mobility comments: pt in chair  Transfers Overall transfer level: Needs assistance Equipment used: Rolling walker (2 wheeled) Transfers: Sit to/from Stand Sit to Stand: Supervision         General transfer comment: cues for safety when returning to sitting    Balance Overall balance assessment: Needs assistance   Sitting balance-[name]: Normal       Standing balance-[name]: Fair Standing balance comment: at sink                           ADL either performed or assessed with clinical judgement   ADL Overall ADL's : Needs assistance/impaired     Grooming: Wash/dry hands;Standing;Supervision/safety           Upper Body Dressing : Set up;Sitting   Lower Body Dressing: Set up;Sitting/lateral leans(with legs up in recliner)               Functional mobility during ADLs: Supervision/safety;Rolling walker       Vision       Perception     Praxis      Cognition  Arousal/Alertness: Awake/alert Behavior During Therapy: WFL for tasks assessed/performed Overall Cognitive Status: Within Functional Limits for tasks assessed                                 General Comments: impatient and irritable today        Exercises     Shoulder Instructions       General Comments      Pertinent Vitals/ Pain       Pain Assessment: Faces Faces Pain Scale: Hurts even more Pain Location: R LE Pain Descriptors / Indicators: Numbness;Tightness Pain Intervention(s): Repositioned  Home Living                                          Prior Functioning/Environment              Frequency  Min 2X/week        Progress Toward Goals  OT Goals(current goals can now be found in the care plan section)  Progress towards OT goals: Progressing toward goals  Acute Rehab OT Goals Patient Stated Goal: Moving around easier. OT Goal Formulation: With patient Time For Goal Achievement: 09/21/18 Potential  to Achieve Goals: Good  Plan Discharge plan remains appropriate;Frequency remains appropriate    Co-evaluation                 AM-PAC OT "6 Clicks" Daily Activity     Outcome Measure   Help from another person eating meals?: None Help from another person taking care of personal grooming?: A Little Help from another person toileting, which includes using toliet, bedpan, or urinal?: A Lot Help from another person bathing (including washing, rinsing, drying)?: A Little Help from another person to put on and taking off regular upper body clothing?: A Little Help from another person to put on and taking off regular lower body clothing?: A Little 6 Click Score: 18    End of Session Equipment Utilized During Treatment: Rolling walker  OT Visit Diagnosis: Unsteadiness on feet (R26.81);Other abnormalities of gait and mobility (R26.89);Muscle weakness (generalized) (M62.81);Other symptoms and signs involving cognitive  function;Adult, failure to thrive (R62.7)   Activity Tolerance Patient tolerated treatment well   Patient Left in chair;with call bell/phone within reach   Nurse Communication          Time: 1203-1217 OT Time Calculation (min): 14 min  Charges: OT General Charges $OT Visit: 1 Visit OT Treatments $Self Care/Home Management : 8-22 mins  [name], OTR/L Acute Rehabilitation Services Pager: [phone] Office: [phone]   Malka So 09/19/2018, 1:04 PM

## 2018-09-19 NOTE — Progress Notes (Signed)
Patient ID: Kyle Conrad, male   DOB: 07/17/1965, 53 y.o.   MRN: [id number]     Advanced Heart Failure Rounding Note  PCP-Cardiologist: No primary care provider on file.   Subjective:    Remains weak. Denies CP or SOB.   Wound consult re-evaluated yesterday.    Still pending SNF bed when available.  Objective:   Weight Range: 64.9 kg Body mass index is 18.88 kg/m.   Vital Signs:   Temp:  [97.7 F (36.5 C)-98.5 F (36.9 C)] 97.8 F (36.6 C) (05/06 0750) Pulse Rate:  [94-106] 95 (05/06 0300) Resp:  [15-22] 18 (05/06 0300) BP: (90-104)/(62-75) 96/62 (05/06 0300) SpO2:  [95 %-100 %] 95 % (05/06 0300) Weight:  [64.9 kg] 64.9 kg (05/06 0300) Last BM Date: 09/17/18  Weight change: Filed Weights   09/17/18 0500 09/18/18 0627 09/19/18 0300  Weight: 64.1 kg 63.5 kg 64.9 kg    Intake/Output:   Intake/Output Summary (Last 24 hours) at 09/19/2018 0759 Last data filed at 09/19/2018 0735 Gross per 24 hour  Intake 360 ml  Output 1500 ml  Net -1140 ml      Physical Exam   General: NAD, cachectic Neck: No JVD, no thyromegaly or thyroid nodule.  Lungs: Clear to auscultation bilaterally with normal respiratory effort. CV: Nondisplaced PMI.  Heart regular S1/S2, no S3/S4, 2/6 HSM apex.  No peripheral edema.   Abdomen: Soft, nontender, no hepatosplenomegaly, no distention.  Skin: Intact without lesions or rashes.  Neurologic: Alert and oriented x 3.  Psych: Normal affect. Extremities: No clubbing or cyanosis.  Leg wounds wrapped.  HEENT: Normal.    Telemetry   NSR 80-90s. Personally reviewed   Labs    CBC Recent Labs    09/18/18 0500 09/19/18 0530  WBC 7.5 6.8  NEUTROABS 4.6 4.0  HGB 8.6* 8.6*  HCT 28.0* 27.6*  MCV 95.9 95.2  PLT 540* 259*   Basic Metabolic Panel Recent Labs    09/18/18 0500 09/19/18 0530  NA 136 134*  K 4.7 4.7  CL 100 99  CO2 26 26  GLUCOSE 82 88  BUN 34* 39*  CREATININE 1.01 1.05  CALCIUM 9.3 9.3   Liver Function Tests No  results for input(s): AST, ALT, ALKPHOS, BILITOT, PROT, ALBUMIN in the last 72 hours. No results for input(s): LIPASE, AMYLASE in the last 72 hours. Cardiac Enzymes No results for input(s): CKTOTAL, CKMB, CKMBINDEX, TROPONINI in the last 72 hours.  BNP: BNP (last 3 results) Recent Labs    03/24/18 1435 06/05/18 1411 08/16/18 1611  BNP 2,471.9* 3,961.4* 2,433.3*    ProBNP (last 3 results) No results for input(s): PROBNP in the last 8760 hours.   D-Dimer No results for input(s): DDIMER in the last 72 hours. Hemoglobin A1C No results for input(s): HGBA1C in the last 72 hours. Fasting Lipid Panel No results for input(s): CHOL, HDL, LDLCALC, TRIG, CHOLHDL, LDLDIRECT in the last 72 hours. Thyroid Function Tests No results for input(s): TSH, T4TOTAL, T3FREE, THYROIDAB in the last 72 hours.  Invalid input(s): FREET3  Other results:   Imaging    No results found.   Medications:     Scheduled Medications: . digoxin  0.125 mg Oral Daily  . feeding supplement (ENSURE ENLIVE)  237 mL Oral TID BM  . feeding supplement (PRO-STAT SUGAR FREE 64)  30 mL Oral BID  . Gerhardt's butt cream   Topical Daily  . heparin injection (subcutaneous)  5,000 Units Subcutaneous Q8H  . isosorbide-hydrALAZINE  0.5 tablet Oral TID  .  multivitamin with minerals  1 tablet Oral Daily  . pantoprazole  40 mg Oral Daily  . sodium chloride flush  10-40 mL Intracatheter Q12H  . sodium hypochlorite   Irrigation Q1200  . spironolactone  12.5 mg Oral Daily  . thiamine  100 mg Per Tube Daily    Infusions: . sodium chloride 250 mL (08/28/18 0014)    PRN Medications: acetaminophen (TYLENOL) oral liquid 160 mg/5 mL, acetaminophen, diphenhydrAMINE, lip balm, ondansetron (ZOFRAN) IV, polyethylene glycol, senna, sodium chloride flush, sodium chloride flush   Assessment/Plan   1. 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  He was markedly volume overloaded at admission with suspected mixed septic/cardiogenic shock.  Echo 4/3 with severe biventricular failure, LVEF 20-25% small effusion, moderate to severe MR and severe TR.  He is now off dobutamine and well-diuresed.  Volume looks ok. SBP in 100s.  - I will give him torsemide 20 mg on Tuesdays and Fridays.  - Continue Bidil 1/2 tab tid.   - Continue digoxin 0.125 daily.  - Continue low dose spironolactone 12.5 daily   - Losartan stopped with soft BP and rising BUN.    - He is not going to be a candidate for advanced therapies.  LVAD likely not an option regardless with severe RV dysfunction and has had very poor compliance with medical therapy.  2. Chest pain: Doubt ischemic. Trop negative. ECG with non-specific repol abnormalities. Cath 2016 no CAD. No further CP. 3. ID: Suspect component of septic shock, PCT 6.76.  Citrobacter freundii in blood initially. ?Source from lower leg/foot skin breakdown or stool.  Afebrile now with normal WBCs. Repeat cultures NGTD. Now off antibiotics.  4. AKI: Concern for ATN in setting of shock.  Was down at home for an undetermined length of time.  Initially required CVVH, now off. Resolved with creatinine now back to baseline. BUN/creatinine stable.  5. Neuro: Confusion/delirium initially, likely in setting of critical illness/infection. Head CT 4/4 with no acute changes. Passed swallow study.  Aphonia resolved.  6. Anemia: Received 1 unit PRBCs on 08/30/18. Hgb 8.6 (stable) today, no overt bleeding.  7. Vascular: ABIs normal.  Suspect skin breakdown on foot related to venous stasis and marked swelling initially.    - Daily dressing changes.  8. FEN: Severe protein calorie malnutrition. Received short term tube feeds with Cor-trak which was removed 4/14. Nutrition services following.  9. Hyperkalemia: Resolved, tolerating low dose spironolactone.  10. LE wounds  and sacral decub: WOC has seen again for further recommendations. Needs improved nutrition. No change.  11.  Deconditioning: Severe.  Out of bed, PT/OT.  Will need SNF - see below. No change.  12.  Disposition: Prognosis overall poor. He is ready for SNF placement when bed available. This is only remaining issue.  - Still waiting for placement. Hopefully social work will make this a priority and push for resolution as he has been ready for a while now. Maybe he could go to CIR?   Length of Stay: [place], MD  7:59 AM  Advanced Heart Failure Team Pager [phone] (M-F; 7a - 4p)  Please contact [facility] for night-coverage after hours (4p -7a ) and weekends on [email or web address]

## 2018-09-20 LAB — CBC WITH DIFFERENTIAL/PLATELET
Abs Immature Granulocytes: 0.02 10*3/uL (ref 0.00–0.07)
Basophils Absolute: 0 10*3/uL (ref 0.0–0.1)
Basophils Relative: 0 %
Eosinophils Absolute: 0.7 10*3/uL — ABNORMAL HIGH (ref 0.0–0.5)
Eosinophils Relative: 10 %
HCT: 28 % — ABNORMAL LOW (ref 39.0–52.0)
Hemoglobin: 8.8 g/dL — ABNORMAL LOW (ref 13.0–17.0)
Immature Granulocytes: 0 %
Lymphocytes Relative: 18 %
Lymphs Abs: 1.3 10*3/uL (ref 0.7–4.0)
MCH: 29.9 pg (ref 26.0–34.0)
MCHC: 31.4 g/dL (ref 30.0–36.0)
MCV: 95.2 fL (ref 80.0–100.0)
Monocytes Absolute: 1 10*3/uL (ref 0.1–1.0)
Monocytes Relative: 14 %
Neutro Abs: 3.9 10*3/uL (ref 1.7–7.7)
Neutrophils Relative %: 58 %
Platelets: 440 10*3/uL — ABNORMAL HIGH (ref 150–400)
RBC: 2.94 MIL/uL — ABNORMAL LOW (ref 4.22–5.81)
RDW: 18.4 % — ABNORMAL HIGH (ref 11.5–15.5)
WBC: 6.8 10*3/uL (ref 4.0–10.5)
nRBC: 0 % (ref 0.0–0.2)

## 2018-09-20 LAB — BASIC METABOLIC PANEL
Anion gap: 10 (ref 5–15)
BUN: 35 mg/dL — ABNORMAL HIGH (ref 6–20)
CO2: 27 mmol/L (ref 22–32)
Calcium: 9.1 mg/dL (ref 8.9–10.3)
Chloride: 95 mmol/L — ABNORMAL LOW (ref 98–111)
Creatinine, Ser: 1.02 mg/dL (ref 0.61–1.24)
GFR calc Af Amer: >60 mL/min (ref >60)
GFR calc non Af Amer: >60 mL/min (ref >60)
Glucose, Bld: 101 mg/dL — ABNORMAL HIGH (ref 70–99)
Potassium: 5.2 mmol/L — ABNORMAL HIGH (ref 3.5–5.1)
Sodium: 132 mmol/L — ABNORMAL LOW (ref 135–145)

## 2018-09-20 LAB — SARS CORONAVIRUS 2 BY RT PCR (HOSPITAL ORDER, PERFORMED IN ~~LOC~~ HOSPITAL LAB): SARS Coronavirus 2: NEGATIVE

## 2018-09-20 MED ORDER — DAKINS (1/4 STRENGTH) 0.125 % EX SOLN: Freq: Every day | CUTANEOUS | 0 refills | Status: AC

## 2018-09-20 MED ORDER — SENNA 8.6 MG PO TABS: 1.0000 | ORAL_TABLET | Freq: Every day | ORAL | 0 refills | Status: AC | PRN

## 2018-09-20 MED ORDER — ADULT MULTIVITAMIN W/MINERALS CH: 1.0000 | ORAL_TABLET | Freq: Every day | ORAL | Status: AC

## 2018-09-20 MED ORDER — POLYETHYLENE GLYCOL 3350 17 G PO PACK: 17.0000 g | PACK | Freq: Every day | ORAL | 0 refills | Status: AC | PRN

## 2018-09-20 MED ORDER — ISOSORB DINITRATE-HYDRALAZINE 20-37.5 MG PO TABS: 0.5000 | ORAL_TABLET | Freq: Three times a day (TID) | ORAL | 6 refills | Status: AC

## 2018-09-20 MED ORDER — THIAMINE HCL 100 MG PO TABS: 100.0000 mg | ORAL_TABLET | Freq: Every day | ORAL | Status: AC

## 2018-09-20 MED ORDER — PRO-STAT SUGAR FREE PO LIQD: 30.0000 mL | Freq: Two times a day (BID) | ORAL | 0 refills | Status: AC

## 2018-09-20 MED ORDER — ENSURE ENLIVE PO LIQD: 237.0000 mL | Freq: Three times a day (TID) | ORAL | 12 refills | Status: AC

## 2018-09-20 MED ORDER — PANTOPRAZOLE SODIUM 40 MG PO TBEC: 40.0000 mg | DELAYED_RELEASE_TABLET | Freq: Every day | ORAL | Status: AC

## 2018-09-20 MED ORDER — DIGOXIN 125 MCG PO TABS: 0.1250 mg | ORAL_TABLET | Freq: Every day | ORAL | 6 refills | Status: AC

## 2018-09-20 NOTE — Progress Notes (Signed)
Physical Therapy Treatment Patient Details Name: Kyle Conrad MRN: [id number] DOB: January 13, 1966 Today's Date: 09/20/2018    History of Present Illness Pt is a 53 y.o. male admitted 08/16/18 with AMS after being found down by neighbor; treated for cardiogenic shock, septic shock with citrobacter bactermeia. Also with AKI; on CRRT 4/3-4/8. Has remained encephalopathic throughout admission. Head CT 4/4 with no acute changes. PMH includes biventricular HF, HTN.    PT Comments    Emphasis on gait stability, increasing gait speed and overall endurance.  Also worked on dynamic balance in the context of gait.    Follow Up Recommendations  SNF;Supervision/Assistance - 24 hour     Equipment Recommendations  Other (comment)    Recommendations for Other Services       Precautions / Restrictions Precautions Precautions: Fall    Mobility  Bed Mobility Overal bed mobility: Modified Independent             General bed mobility comments: slow using rail  Transfers Overall transfer level: Needs assistance   Transfers: Sit to/from Stand Sit to Stand: Min guard;Min assist Stand pivot transfers: (min assist lower surfaces)       General transfer comment: cues for safety on sitting  Ambulation/Gait Ambulation/Gait assistance: Min guard Gait Distance (Feet): 200 Feet(x3) Assistive device: None Gait Pattern/deviations: Step-through pattern Gait velocity: slower to moderate. Gait velocity interpretation: 1.31 - 2.62 ft/sec, indicative of limited community ambulator General Gait Details: wide BOS gait pattern overall, but improving to a more narrowed, less flat-footed pattern.  Still stiff overall with some ability to speed up, but not significantly so.   Stairs             Wheelchair Mobility    Modified Rankin (Stroke Patients Only)       Balance     Sitting balance-[name]: Normal       Standing balance-[name]: Fair                               Cognition Arousal/Alertness: Awake/alert Behavior During Therapy: WFL for tasks assessed/performed Overall Cognitive Status: Within Functional Limits for tasks assessed                                 General Comments: NT formally.  Shows much more awareness and ability to problem solve      Exercises      General Comments General comments (skin integrity, edema, etc.): vss      Pertinent Vitals/Pain Pain Assessment: Faces Faces Pain Scale: Hurts little more Pain Location: R LE Pain Descriptors / Indicators: Numbness;Tightness Pain Intervention(s): Monitored during session    Home Living                      Prior Function            PT Goals (current goals can now be found in the care plan section) Acute Rehab PT Goals Patient Stated Goal: Moving around easier. PT Goal Formulation: With patient Time For Goal Achievement: 09/20/18 Potential to Achieve Goals: Fair Progress towards PT goals: Progressing toward goals    Frequency    Min 2X/week      PT Plan Current plan remains appropriate    Co-evaluation              AM-PAC PT "6 Clicks" Mobility   Outcome Measure  Help needed turning from your back to your side while in a flat bed without using bedrails?: None Help needed moving from lying on your back to sitting on the side of a flat bed without using bedrails?: None Help needed moving to and from a bed to a chair (including a wheelchair)?: None Help needed standing up from a chair using your arms (e.g., wheelchair or bedside chair)?: None Help needed to walk in hospital room?: A Little Help needed climbing 3-5 steps with a railing? : A Little 6 Click Score: 22    End of Session   Activity Tolerance: Patient tolerated treatment well Patient left: in bed;with call bell/phone within reach;with bed alarm set Nurse Communication: Mobility status PT Visit Diagnosis: Other abnormalities of gait and mobility (R26.89)      Time: 7035-0093 PT Time Calculation (min) (ACUTE ONLY): 26 min  Charges:  $Gait Training: 23-37 mins                     09/20/2018  Donnella Sham, PT Acute Rehabilitation Services [phone]  (pager) [phone]  (office)   [name] 09/20/2018, 4:05 PM

## 2018-09-20 NOTE — TOC Transition Note (Signed)
Transition of Care [facility]) - CM/SW Discharge Note   Patient Details  Name: Kyle Conrad MRN: [id number] Date of Birth: Apr 17, 1966  Transition of Care [facility]) CM/SW Contact:  Dozier, Ber[place] Phone Number: [phone] 09/20/2018, 4:07 PM   Clinical Narrative:    Patient will DC to: [facility] Date: 09/20/2018 Family Notified:Denise, cousin Transport RS:WNIOE  RN, patient, and facility notified of DC. Discharge Summary sent to facility. RN given number for report [phone]. DC packet on chart. Ambulance transport requested for patient.   Clinical Social Worker signing off. Thurmond Butts, MSW, [facility] Clinical Social Worker [phone]    Final next level of care: Skilled Nursing Facility Barriers to Discharge: Barriers Resolved   Patient Goals and CMS Choice Patient states their goals for this hospitalization and ongoing recovery are:: go to rehab and then go home   Choice offered to / list presented to : Patient  Discharge Placement   Existing PASRR number confirmed : 08/30/18          Patient chooses bed at: ([facility] ) Patient to be transferred to facility by: [facility] [place] Name of family member notified: [name], cousin Patient and family notified of of transfer: 09/20/18  Discharge Plan and Services                                     Social Determinants of Health ([place]) Interventions     Readmission Risk Interventions Readmission Risk Prevention Plan 08/28/2018  Transportation Screening Complete  PCP or Specialist Appt within 3-5 Days Complete  HRI or Home Care Consult Complete  Social Work Consult for [place] Planning/Counseling Complete  Palliative Care Screening Not Applicable  Medication Review Press photographer) Complete  Some recent data might be hidden

## 2018-09-20 NOTE — Progress Notes (Signed)
IV Team consult: Spoke with 2C RN, pt has midline. Unit staff can remove.

## 2018-09-20 NOTE — TOC Progression Note (Signed)
Transition of Care [facility]) - Progression Note    Patient Details  Name: Kyle Conrad MRN: [id number] Date of Birth: 11/07/1965  Transition of Care [facility]) CM/SW [place], [place] Phone Number: [phone] 09/20/2018, 12:41 PM  Clinical Narrative:    CSW called [facility] Admission Coordinator, Clarene Critchley, left vocie message  to confirm if they can receive patient today if Covid-19 test results come back negative today. CSW awaiting return call.   Thurmond Butts, MSW, [facility] Clinical Social Worker [phone]    Expected Discharge Plan: Skilled Nursing Facility Barriers to Discharge: SNF Pending payor source - LOG, SNF Pending bed offer  Expected Discharge Plan and Services Expected Discharge Plan: [place]                                               Social Determinants of Health (SDOH) Interventions    Readmission Risk Interventions Readmission Risk Prevention Plan 08/28/2018  Transportation Screening Complete  PCP or Specialist Appt within 3-5 Days Complete  HRI or [place] Complete  Social Work Consult for [place] Planning/Counseling Complete  Palliative Care Screening Not Applicable  Medication Review Press photographer) Complete  Some recent data might be hidden

## 2018-09-20 NOTE — Progress Notes (Signed)
Wound care to lower left leg and both left buttock and hip complete

## 2018-09-20 NOTE — Progress Notes (Signed)
Patient ID: Kyle Conrad, male   DOB: [birth year], 53 y.o.   MRN: [id number]     Advanced Heart Failure Rounding Note  PCP-Cardiologist: No primary care provider on file.   Subjective:    Remains weak. Denies CP or SOB.  Still thinks he would have a hard time managing at home.   Still pending SNF bed when available.  Objective:   Weight Range: 64.7 kg Body mass index is 18.82 kg/m.   Vital Signs:   Temp:  [97.6 F (36.4 C)-98.9 F (37.2 C)] 97.7 F (36.5 C) (05/07 0754) Pulse Rate:  [80-100] 98 (05/07 0754) Resp:  [13-22] 14 (05/07 0409) BP: (108-170)/(73-84) 117/84 (05/07 0409) SpO2:  [100 %] 100 % (05/07 0754) Weight:  [64.7 kg] 64.7 kg (05/07 0409) Last BM Date: 09/19/18  Weight change: Filed Weights   09/18/18 0627 09/19/18 0300 09/20/18 0409  Weight: 63.5 kg 64.9 kg 64.7 kg    Intake/Output:   Intake/Output Summary (Last 24 hours) at 09/20/2018 0930 Last data filed at 09/20/2018 0409 Gross per 24 hour  Intake 370 ml  Output 1600 ml  Net -1230 ml      Physical Exam   General: NAD, cachectic.  Neck: No JVD, no thyromegaly or thyroid nodule.  Lungs: Clear to auscultation bilaterally with normal respiratory effort. CV: Nondisplaced PMI.  Heart regular S1/S2, no S3/S4, 1/6 HSM apex.  No peripheral edema.   Abdomen: Soft, nontender, no hepatosplenomegaly, no distention.  Skin: Intact without lesions or rashes.  Neurologic: Alert and oriented x 3.  Psych: Normal affect. Extremities: No clubbing or cyanosis.  HEENT: Normal.    Telemetry   NSR 80-90s. Personally reviewed   Labs    CBC Recent Labs    09/19/18 0530 09/20/18 0530  WBC 6.8 6.8  NEUTROABS 4.0 3.9  HGB 8.6* 8.8*  HCT 27.6* 28.0*  MCV 95.2 95.2  PLT 491* 324*   Basic Metabolic Panel Recent Labs    09/19/18 0530 09/20/18 0530  NA 134* 132*  K 4.7 5.2*  CL 99 95*  CO2 26 27  GLUCOSE 88 101*  BUN 39* 35*  CREATININE 1.05 1.02  CALCIUM 9.3 9.1   Liver Function Tests No  results for input(s): AST, ALT, ALKPHOS, BILITOT, PROT, ALBUMIN in the last 72 hours. No results for input(s): LIPASE, AMYLASE in the last 72 hours. Cardiac Enzymes No results for input(s): CKTOTAL, CKMB, CKMBINDEX, TROPONINI in the last 72 hours.  BNP: BNP (last 3 results) Recent Labs    03/24/18 1435 06/05/18 1411 08/16/18 1611  BNP 2,471.9* 3,961.4* 2,433.3*    ProBNP (last 3 results) No results for input(s): PROBNP in the last 8760 hours.   D-Dimer No results for input(s): DDIMER in the last 72 hours. Hemoglobin A1C No results for input(s): HGBA1C in the last 72 hours. Fasting Lipid Panel No results for input(s): CHOL, HDL, LDLCALC, TRIG, CHOLHDL, LDLDIRECT in the last 72 hours. Thyroid Function Tests No results for input(s): TSH, T4TOTAL, T3FREE, THYROIDAB in the last 72 hours.  Invalid input(s): FREET3  Other results:   Imaging    No results found.   Medications:     Scheduled Medications: . digoxin  0.125 mg Oral Daily  . feeding supplement (ENSURE ENLIVE)  237 mL Oral TID BM  . feeding supplement (PRO-STAT SUGAR FREE 64)  30 mL Oral BID  . Gerhardt's butt cream   Topical Daily  . heparin injection (subcutaneous)  5,000 Units Subcutaneous Q8H  . isosorbide-hydrALAZINE  0.5 tablet Oral  TID  . multivitamin with minerals  1 tablet Oral Daily  . pantoprazole  40 mg Oral Daily  . sodium chloride flush  10-40 mL Intracatheter Q12H  . sodium hypochlorite   Irrigation Q1200  . spironolactone  12.5 mg Oral Daily  . thiamine  100 mg Per Tube Daily    Infusions: . sodium chloride 250 mL (08/28/18 0014)    PRN Medications: acetaminophen (TYLENOL) oral liquid 160 mg/5 mL, acetaminophen, diphenhydrAMINE, lip balm, ondansetron (ZOFRAN) IV, polyethylene glycol, senna, sodium chloride flush, sodium chloride flush   Assessment/Plan   1. 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  He was markedly volume overloaded at admission with suspected mixed septic/cardiogenic shock.  Echo 4/3 with severe biventricular failure, LVEF 20-25% small effusion, moderate to severe MR and severe TR.  He is now off dobutamine and well-diuresed.  Volume looks ok. SBP in 100s.  - I will give him torsemide 20 mg on Tuesdays and Fridays.  - Continue Bidil 1/2 tab tid.   - Continue digoxin 0.125 daily.  - Continue low dose spironolactone 12.5 daily for now, K higher at 5.2 today.  If K rises again, will need to stop spironolactone.   - Losartan stopped with soft BP and rising BUN.    - He is not going to be a candidate for advanced therapies.  LVAD likely not an option regardless with severe RV dysfunction and has had very poor compliance with medical therapy.  2. Chest pain: Doubt ischemic. Trop negative. ECG with non-specific repol abnormalities. Cath 2016 no CAD. No further CP. 3. ID: Suspect component of septic shock, PCT 6.76.  Citrobacter freundii in blood initially. ?Source from lower leg/foot skin breakdown or stool.  Afebrile now with normal WBCs. Repeat cultures NGTD. Now off antibiotics.  4. AKI: Concern for ATN in setting of shock.  Was down at home for an undetermined length of time.  Initially required CVVH, now off. Resolved with creatinine now back to baseline. BUN/creatinine stable.  5. Neuro: Confusion/delirium initially, likely in setting of critical illness/infection. Head CT 4/4 with no acute changes. Passed swallow study.  Aphonia resolved.  6. Anemia: Received 1 unit PRBCs on 08/30/18. Hgb 8.6 (stable) today, no overt bleeding.  7. Vascular: ABIs normal.  Suspect skin breakdown on foot related to venous stasis and marked swelling initially.    - Daily dressing changes.  8. FEN: Severe protein calorie malnutrition. Received short term tube feeds with Cor-trak which was removed 4/14. Nutrition services  following.  9. Hyperkalemia: Resolved, tolerating low dose spironolactone.  10. LE wounds and sacral decub: WOC has seen again for further recommendations. Needs improved nutrition. No change.  11.  Deconditioning: Severe.  Out of bed, PT/OT.  Will need SNF - see below. No change.  12.  Disposition: Prognosis overall poor. He is ready for SNF placement when bed available. This is only remaining issue.  - Still waiting for placement. No social work note for a couple of days.  Will ask them again if they are making any progress.     Length of Stay: [place], [place]  9:30 AM  Advanced Heart Failure Team Pager [phone] (M-F; 7a - 4p)  Please contact [facility] for night-coverage after hours (4p -7a ) and weekends on [email or web address]

## 2018-09-20 NOTE — Progress Notes (Signed)
[facility] able to accept patient, however requesting COVID test as requirement for admission.   Treatment team updated by CSW Caren Griffins.   Please notify once COVID Test results are back, patient can then d/c to [facility] in [place].  [place], [place]

## 2018-09-21 LAB — NOVEL CORONAVIRUS, NAA (HOSP ORDER, SEND-OUT TO REF LAB; TAT 18-24 HRS): SARS-CoV-2, NAA: NOT DETECTED

## 2018-11-20 ENCOUNTER — Encounter (HOSPITAL_COMMUNITY): Payer: Self-pay

## 2019-05-17 DEATH — deceased

## 2019-07-03 IMAGING — CT CT ABD-PELV W/ CM
2 of 5 series · 14 of 46 positions shown, 16 images · IV contrast (APPLIED)
Comparison: 04/23/2015

CLINICAL DATA: Diffuse abdominal pain with nausea and vomiting.
Scrotal pain for the last few days.

EXAM:
CT ABDOMEN AND PELVIS WITH CONTRAST
TECHNIQUE: Multidetector CT imaging of the abdomen and pelvis was performed
using the standard protocol following bolus administration of
intravenous contrast.
CONTRAST:  100mL OMNIPAQUE IOHEXOL 300 MG/ML  SOLN

[Series 3: abdomen 5.0 · axial · 0.79mm/px · z∈[-740,-250]mm · 11 of 113 slices shown, 13 images]
[im 8/113  soft-tissue]
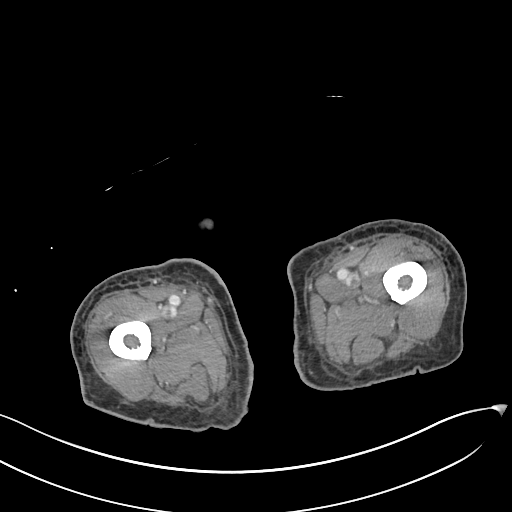
[im 8/113  bone]
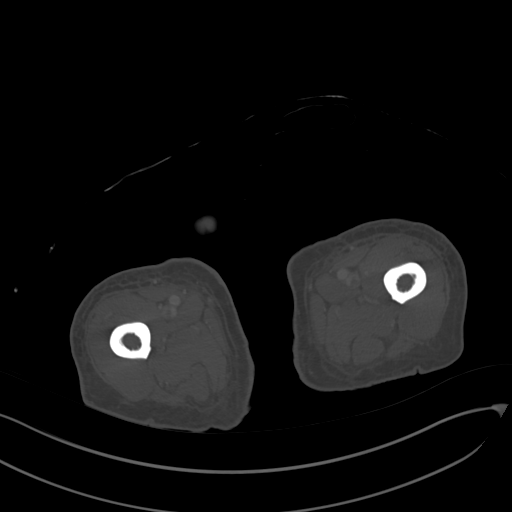
[im 22/113  soft-tissue]
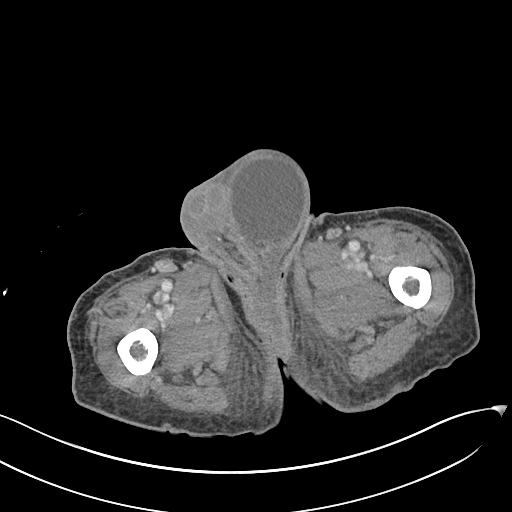
[im 29/113  soft-tissue]
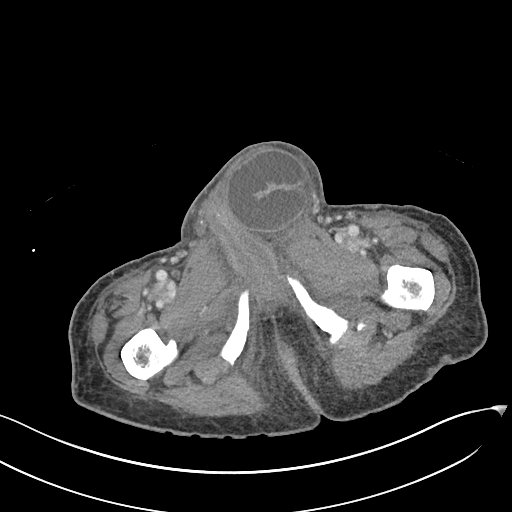
[im 36/113  soft-tissue]
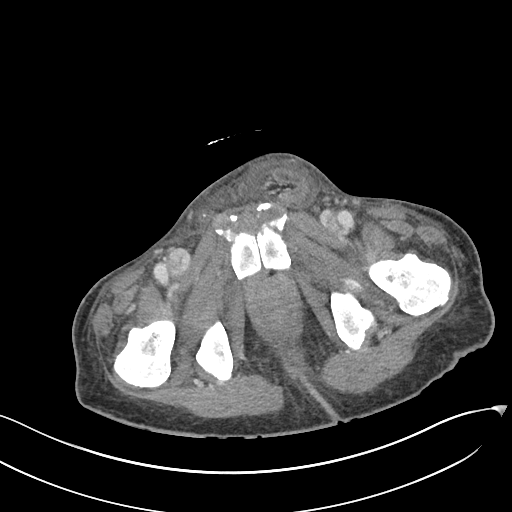
[im 50/113  soft-tissue]
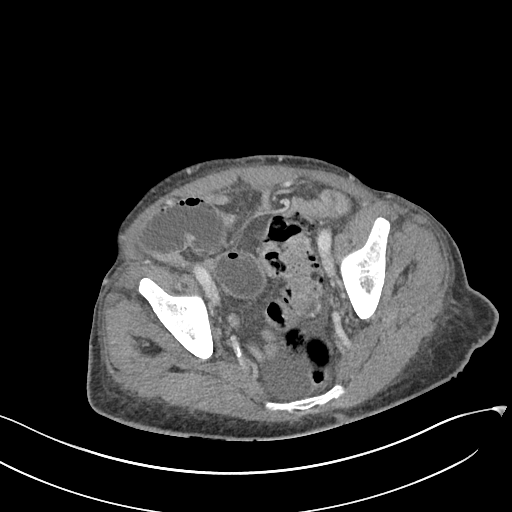
[im 57/113  soft-tissue]
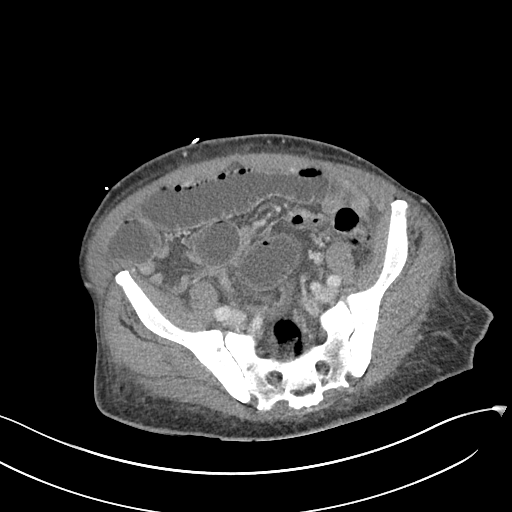
[im 64/113  soft-tissue]
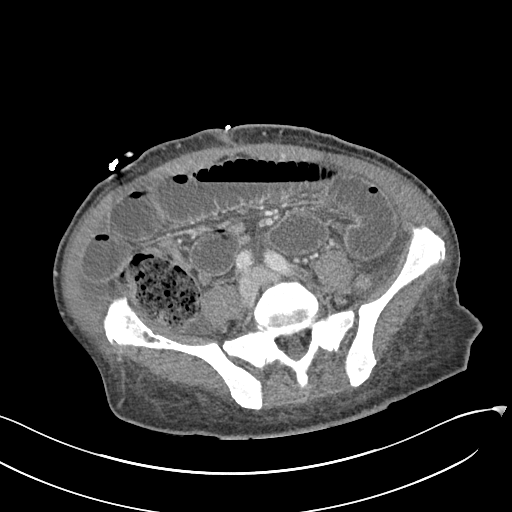
[im 78/113  soft-tissue]
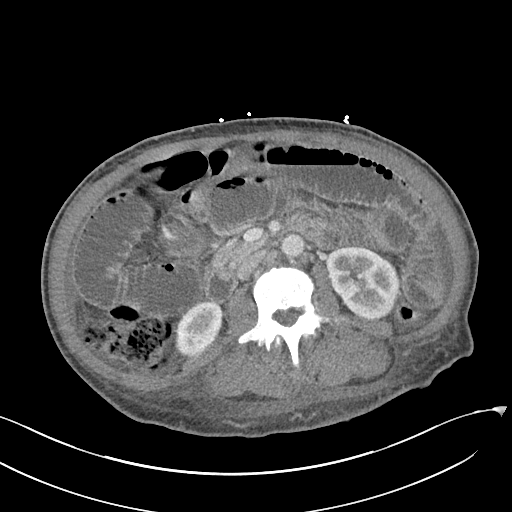
[im 85/113  soft-tissue]
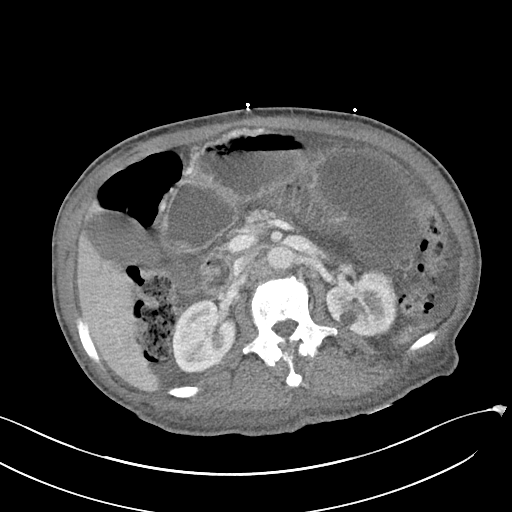
[im 85/113  bone]
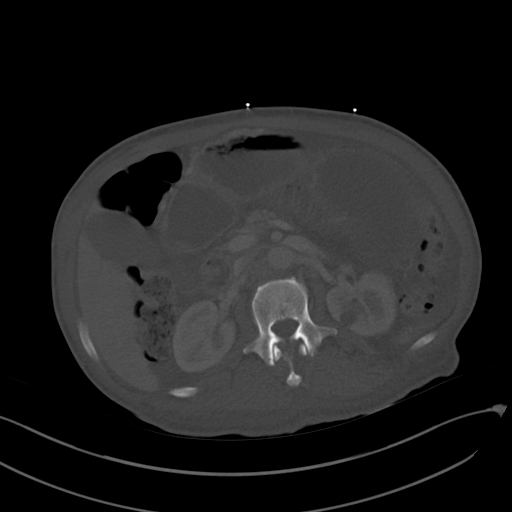
[im 92/113  soft-tissue]
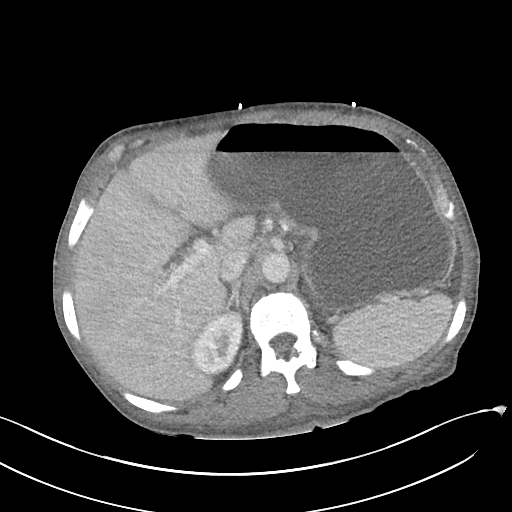
[im 106/113  soft-tissue]
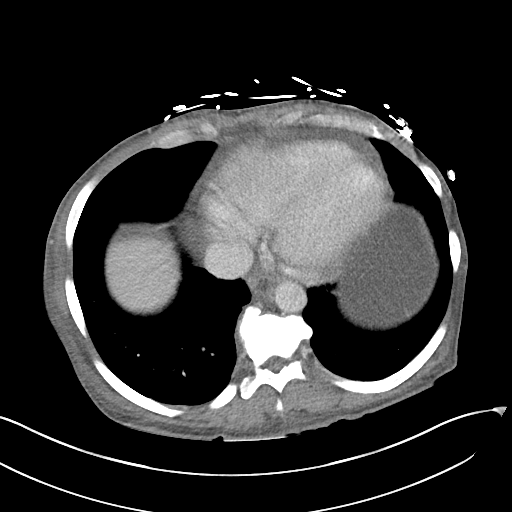

[Series 6: abdomen 3.0 mpr cor · coronal · 0.84mm/px · 3 of 88 slices shown]
[im 30/88  soft-tissue]
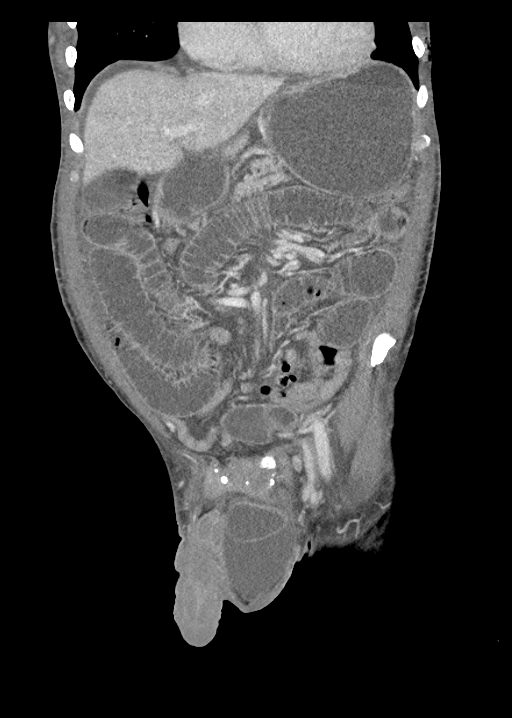
[im 39/88  soft-tissue]
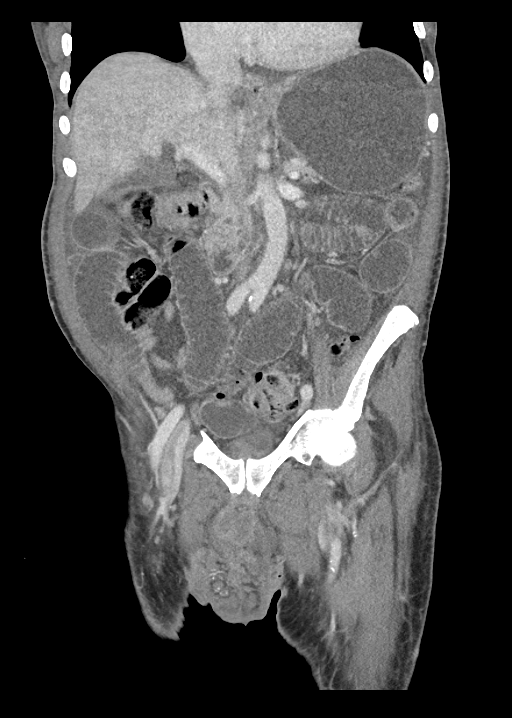
[im 49/88  soft-tissue]
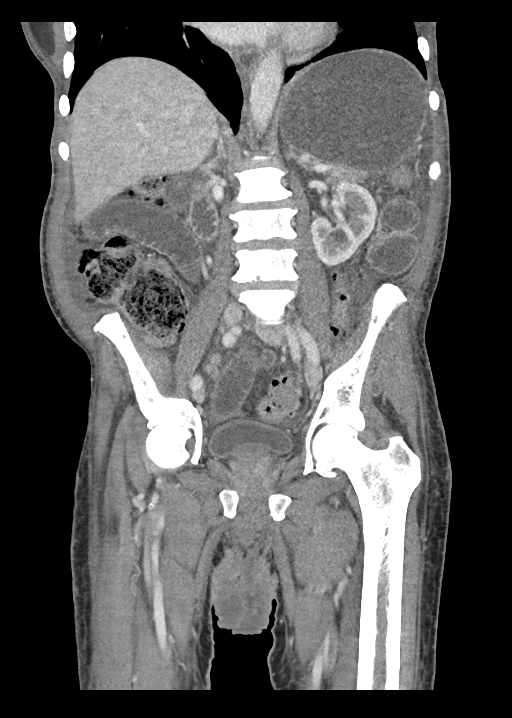

[14 of 46 positions shown; findings below may reference images not displayed]

FINDINGS: Lower chest: Mild cardiomegaly. Incidental lipoma along the right
serratus anterior muscle partially included on imaging on today's
exam.

Hepatobiliary: Unremarkable

Pancreas: Moderate pancreatic atrophy for age, otherwise
unremarkable.

Spleen: Unremarkable

Adrenals/Urinary Tract: Adrenal glands normal. Two hypodense lesions
in the left kidney are likely cysts but technically too small to
characterize.

Asymmetric delay in excretion of the left kidney compared to the
right, best seen on the delayed images. Severe narrowing of the left
renal vein behind the superior mesenteric artery and abnormally low
SMA-aortic angle at 21 degrees. There is also early opacification of
the left gonadal vein indicating collateralization. The appearance
is compatible with anterior nutcracker syndrome and is facilitated
by the patient's reduced body fat levels.

Stomach/Bowel: Distended stomach and small bowel loops. The small
bowel loops are dilated up to 3.9 cm. Dilated loops extend down to a
indirect left inguinal hernia which contains 1-2 loops of small
bowel as well as a fairly large hydrocele. The small bowel distal to
this hernia is of normal caliber. No obvious extraluminal gas.

Vascular/Lymphatic: Anterior nutcracker syndrome as discussed in the
renal section above. Mild aortoiliac atherosclerotic vascular
disease.

Reproductive: The prostate gland appears unremarkable. The left
hemiscrotum is swollen by the hernia and adjacent fluid.

Other: Small to moderate amount of ascites in the pelvis, paracolic
gutters, and perihepatic region. No free air. Mild diffuse
subcutaneous edema.

Musculoskeletal: Calcifications along the anterior margins of the
pubic bones in the vicinity of the adductor aponeurosis,
significance uncertain although presumably this could be due to
prior sports hernias. Speckled lucencies in both iliac bones,
slightly greater on the right than the left, but given the symmetry
this is probably incidental.
IMPRESSION: 1. Distal small bowel obstruction due to herniation of a loop of
ileum into an indirect left inguinal hernia. The afferent loop is
dilated as are all the more proximal loops of small bowel. There is
a fluid collection in the hernia sac compatible with hydrocele.
2. Ascites with subcutaneous edema suggesting third spacing of
fluid. No free intraperitoneal gas.
3. Anterior nutcracker syndrome, with compression of the left renal
vein by the superior mesenteric artery and apparent delayed
excretion of the left kidney. Abnormally reduced SMA-aortic angle at
21 degrees and early opacification of the left gonadal vein due to
collateralization are ancillary findings.
4. Mild cardiomegaly.
5.  Aortic Atherosclerosis ([id number].O).
6. Calcifications along the anterior pubis in the vicinity of the
abductor aponeurosis, possibly due to remote athletic pubalgia.
